# Patient Record
Sex: Male | Born: 1940 | Race: White | Hispanic: No | Marital: Married | Smoking: Former smoker
Health system: Southern US, Community
[De-identification: ages and names within clinical notes are randomized; demographics above are authoritative.]

## PROBLEM LIST (undated history)

## (undated) DIAGNOSIS — Z974 Presence of external hearing-aid: Secondary | ICD-10-CM

## (undated) DIAGNOSIS — E785 Hyperlipidemia, unspecified: Secondary | ICD-10-CM

## (undated) DIAGNOSIS — Z8719 Personal history of other diseases of the digestive system: Secondary | ICD-10-CM

## (undated) DIAGNOSIS — I2510 Atherosclerotic heart disease of native coronary artery without angina pectoris: Secondary | ICD-10-CM

## (undated) DIAGNOSIS — Z972 Presence of dental prosthetic device (complete) (partial): Secondary | ICD-10-CM

## (undated) DIAGNOSIS — K219 Gastro-esophageal reflux disease without esophagitis: Secondary | ICD-10-CM

## (undated) DIAGNOSIS — Z7902 Long term (current) use of antithrombotics/antiplatelets: Secondary | ICD-10-CM

## (undated) DIAGNOSIS — N183 Chronic kidney disease, stage 3 unspecified: Secondary | ICD-10-CM

## (undated) DIAGNOSIS — E119 Type 2 diabetes mellitus without complications: Secondary | ICD-10-CM

## (undated) DIAGNOSIS — C801 Malignant (primary) neoplasm, unspecified: Secondary | ICD-10-CM

## (undated) DIAGNOSIS — I10 Essential (primary) hypertension: Secondary | ICD-10-CM

## (undated) DIAGNOSIS — G43909 Migraine, unspecified, not intractable, without status migrainosus: Secondary | ICD-10-CM

## (undated) DIAGNOSIS — G4733 Obstructive sleep apnea (adult) (pediatric): Secondary | ICD-10-CM

## (undated) DIAGNOSIS — G3184 Mild cognitive impairment, so stated: Secondary | ICD-10-CM

## (undated) DIAGNOSIS — R42 Dizziness and giddiness: Secondary | ICD-10-CM

## (undated) DIAGNOSIS — J189 Pneumonia, unspecified organism: Secondary | ICD-10-CM

## (undated) DIAGNOSIS — C4491 Basal cell carcinoma of skin, unspecified: Secondary | ICD-10-CM

## (undated) DIAGNOSIS — M1990 Unspecified osteoarthritis, unspecified site: Secondary | ICD-10-CM

## (undated) DIAGNOSIS — R519 Headache, unspecified: Secondary | ICD-10-CM

## (undated) DIAGNOSIS — N400 Enlarged prostate without lower urinary tract symptoms: Secondary | ICD-10-CM

## (undated) DIAGNOSIS — I219 Acute myocardial infarction, unspecified: Secondary | ICD-10-CM

## (undated) DIAGNOSIS — G4730 Sleep apnea, unspecified: Secondary | ICD-10-CM

## (undated) HISTORY — PX: CARDIAC CATHETERIZATION: SHX172

## (undated) HISTORY — PX: HERNIA REPAIR: SHX51

## (undated) HISTORY — PX: CATARACT EXTRACTION: SUR2

## (undated) HISTORY — PX: UMBILICAL HERNIA REPAIR: SHX196

## (undated) HISTORY — PX: JOINT REPLACEMENT: SHX530

---

## 2014-09-23 DIAGNOSIS — R040 Epistaxis: Secondary | ICD-10-CM | POA: Insufficient documentation

## 2014-09-23 DIAGNOSIS — K219 Gastro-esophageal reflux disease without esophagitis: Secondary | ICD-10-CM | POA: Insufficient documentation

## 2015-02-04 DIAGNOSIS — H524 Presbyopia: Secondary | ICD-10-CM | POA: Insufficient documentation

## 2015-02-04 DIAGNOSIS — H5200 Hypermetropia, unspecified eye: Secondary | ICD-10-CM | POA: Insufficient documentation

## 2015-02-04 DIAGNOSIS — H2510 Age-related nuclear cataract, unspecified eye: Secondary | ICD-10-CM | POA: Insufficient documentation

## 2015-03-26 DIAGNOSIS — R1310 Dysphagia, unspecified: Secondary | ICD-10-CM | POA: Insufficient documentation

## 2019-03-12 DIAGNOSIS — M47812 Spondylosis without myelopathy or radiculopathy, cervical region: Secondary | ICD-10-CM | POA: Insufficient documentation

## 2019-05-30 DIAGNOSIS — M48061 Spinal stenosis, lumbar region without neurogenic claudication: Secondary | ICD-10-CM | POA: Insufficient documentation

## 2019-07-09 DIAGNOSIS — N281 Cyst of kidney, acquired: Secondary | ICD-10-CM | POA: Insufficient documentation

## 2020-02-11 DIAGNOSIS — Q613 Polycystic kidney, unspecified: Secondary | ICD-10-CM | POA: Insufficient documentation

## 2020-02-11 DIAGNOSIS — I10 Essential (primary) hypertension: Secondary | ICD-10-CM | POA: Insufficient documentation

## 2020-02-11 DIAGNOSIS — N400 Enlarged prostate without lower urinary tract symptoms: Secondary | ICD-10-CM | POA: Insufficient documentation

## 2020-07-21 DIAGNOSIS — I219 Acute myocardial infarction, unspecified: Secondary | ICD-10-CM

## 2020-07-21 HISTORY — DX: Acute myocardial infarction, unspecified: I21.9

## 2020-07-27 DIAGNOSIS — I213 ST elevation (STEMI) myocardial infarction of unspecified site: Secondary | ICD-10-CM

## 2020-07-27 HISTORY — DX: ST elevation (STEMI) myocardial infarction of unspecified site: I21.3

## 2020-09-02 DIAGNOSIS — I2510 Atherosclerotic heart disease of native coronary artery without angina pectoris: Secondary | ICD-10-CM | POA: Insufficient documentation

## 2020-09-02 DIAGNOSIS — E7800 Pure hypercholesterolemia, unspecified: Secondary | ICD-10-CM | POA: Insufficient documentation

## 2020-09-10 ENCOUNTER — Encounter: Payer: Medicare Other | Attending: Cardiology

## 2020-09-10 ENCOUNTER — Other Ambulatory Visit: Payer: Self-pay

## 2020-09-10 DIAGNOSIS — Z955 Presence of coronary angioplasty implant and graft: Secondary | ICD-10-CM

## 2020-09-10 DIAGNOSIS — I213 ST elevation (STEMI) myocardial infarction of unspecified site: Secondary | ICD-10-CM

## 2020-09-10 NOTE — Progress Notes (Signed)
Virtual Visit completed. Patient informed on EP and RD appointment and 6 Minute walk test. Patient also informed of patient health questionnaires on My Chart. Patient Verbalizes understanding. Visit diagnosis can be found in [facility] 09/02/20.

## 2020-09-16 ENCOUNTER — Encounter: Payer: Medicare Other | Admitting: *Deleted

## 2020-09-16 ENCOUNTER — Other Ambulatory Visit: Payer: Self-pay

## 2020-09-16 VITALS — Ht 68.7 in | Wt 192.0 lb

## 2020-09-16 DIAGNOSIS — I213 ST elevation (STEMI) myocardial infarction of unspecified site: Secondary | ICD-10-CM | POA: Diagnosis not present

## 2020-09-16 DIAGNOSIS — Z955 Presence of coronary angioplasty implant and graft: Secondary | ICD-10-CM | POA: Diagnosis present

## 2020-09-16 NOTE — Patient Instructions (Signed)
Patient Instructions  Patient Details  Name: Carlos Mathis MRN: [id number] Date of Birth: [birth year] Referring Provider:  [name], MD  Below are your personal goals for exercise, nutrition, and risk factors. Our goal is to help you stay on track towards obtaining and maintaining these goals. We will be discussing your progress on these goals with you throughout the program.  Initial Exercise Prescription:  Initial Exercise Prescription - 09/16/20 1500      Date of Initial Exercise RX and Referring Provider   Date 09/16/20    Referring Provider Paraschos, Alexander MD      Treadmill   MPH 1.9    Grade 0.5    Minutes 15    METs 2.59      REL-XR   Level 1    Speed 50    Minutes 15    METs 2.5      T5 Nustep   Level 1    SPM 80    Minutes 15    METs 2.5      Prescription Details   Frequency (times per week) 2    Duration Progress to 30 minutes of continuous aerobic without signs/symptoms of physical distress      Intensity   THRR 40-80% of Max Heartrate 104-129    Ratings of Perceived Exertion 11-13    Perceived Dyspnea 0-4      Progression   Progression Continue to progress workloads to maintain intensity without signs/symptoms of physical distress.      Resistance Training   Training Prescription Yes    Weight 3 lb    Reps 10-15           Exercise Goals: Frequency: Be able to perform aerobic exercise two to three times per week in program working toward 2-5 days per week of home exercise.  Intensity: Work with a perceived exertion of 11 (fairly light) - 15 (hard) while following your exercise prescription.  We will make changes to your prescription with you as you progress through the program.   Duration: Be able to do 30 to 45 minutes of continuous aerobic exercise in addition to a 5 minute warm-up and a 5 minute cool-down routine.   Nutrition Goals: Your personal nutrition goals will be established when you do your nutrition analysis with  the dietician.  The following are general nutrition guidelines to follow: Cholesterol < 200mg /day Sodium < 1500mg /day Fiber: Men over 50 yrs - 30 grams per day  Personal Goals:  Personal Goals and Risk Factors at Admission - 09/16/20 1524      Core Components/Risk Factors/Patient Goals on Admission    Weight Management Yes;Weight Loss    Intervention Weight Management: Develop a combined nutrition and exercise program designed to reach desired caloric intake, while maintaining appropriate intake of nutrient and fiber, sodium and fats, and appropriate energy expenditure required for the weight goal.;Weight Management: Provide education and appropriate resources to help participant work on and attain dietary goals.;Weight Management/Obesity: Establish reasonable short term and long term weight goals.    Admit Weight 192 lb (87.1 kg)    Goal Weight: Short Term 185 lb (83.9 kg)    Goal Weight: Long Term 180 lb (81.6 kg)    Expected Outcomes Short Term: Continue to assess and modify interventions until short term weight is achieved;Long Term: Adherence to nutrition and physical activity/exercise program aimed toward attainment of established weight goal;Weight Loss: Understanding of general recommendations for a balanced deficit meal plan, which promotes 1-2 lb weight loss per  week and includes a negative energy balance of [phone] kcal/d;Understanding recommendations for meals to include 15-35% energy as protein, 25-35% energy from fat, 35-60% energy from carbohydrates, less than 200mg  of dietary cholesterol, 20-35 gm of total fiber daily;Understanding of distribution of calorie intake throughout the day with the consumption of 4-5 meals/snacks    Diabetes Yes    Intervention Provide education about signs/symptoms and action to take for hypo/hyperglycemia.;Provide education about proper nutrition, including hydration, and aerobic/resistive exercise prescription along with prescribed medications to  achieve blood glucose in normal ranges: Fasting glucose 65-99 mg/dL    Expected Outcomes Short Term: Participant verbalizes understanding of the signs/symptoms and immediate care of hyper/hypoglycemia, proper foot care and importance of medication, aerobic/resistive exercise and nutrition plan for blood glucose control.;Long Term: Attainment of HbA1C < 7%.    Hypertension Yes    Intervention Provide education on lifestyle modifcations including regular physical activity/exercise, weight management, moderate sodium restriction and increased consumption of fresh fruit, vegetables, and low fat dairy, alcohol moderation, and smoking cessation.;Monitor prescription use compliance.    Expected Outcomes Short Term: Continued assessment and intervention until BP is < 140/43mm HG in hypertensive participants. < 130/54mm HG in hypertensive participants with diabetes, heart failure or chronic kidney disease.;Long Term: Maintenance of blood pressure at goal levels.    Lipids Yes    Intervention Provide education and support for participant on nutrition & aerobic/resistive exercise along with prescribed medications to achieve LDL 70mg , HDL >40mg .    Expected Outcomes Short Term: Participant states understanding of desired cholesterol values and is compliant with medications prescribed. Participant is following exercise prescription and nutrition guidelines.;Long Term: Cholesterol controlled with medications as prescribed, with individualized exercise RX and with personalized nutrition plan. Value goals: LDL < 70mg , HDL > 40 mg.           Tobacco Use Initial Evaluation: Social History   Tobacco Use  Smoking Status Former Smoker  . Packs/day: 1.00  . Years: 20.00  . Pack years: 20.00  . Types: Cigarettes  . Quit date: 09/11/1983  . Years since quitting: 37.0  Smokeless Tobacco Former Systems developer  . Types: Chew  . Quit date: 09/11/1983    Exercise Goals and Review:  Exercise Goals    Row Name 09/16/20 1522              Exercise Goals   Increase Physical Activity Yes       Intervention Provide advice, education, support and counseling about physical activity/exercise needs.;Develop an individualized exercise prescription for aerobic and resistive training based on initial evaluation findings, risk stratification, comorbidities and participant's personal goals.       Expected Outcomes Short Term: Attend rehab on a regular basis to increase amount of physical activity.;Long Term: Add in home exercise to make exercise part of routine and to increase amount of physical activity.;Long Term: Exercising regularly at least 3-5 days a week.       Increase Strength and Stamina Yes       Intervention Provide advice, education, support and counseling about physical activity/exercise needs.;Develop an individualized exercise prescription for aerobic and resistive training based on initial evaluation findings, risk stratification, comorbidities and participant's personal goals.       Expected Outcomes Short Term: Increase workloads from initial exercise prescription for resistance, speed, and METs.;Short Term: Perform resistance training exercises routinely during rehab and add in resistance training at home;Long Term: Improve cardiorespiratory fitness, muscular endurance and strength as measured by increased METs and functional capacity (6MWT)  Able to understand and use rate of perceived exertion (RPE) scale Yes       Intervention Provide education and explanation on how to use RPE scale       Expected Outcomes Short Term: Able to use RPE daily in rehab to express subjective intensity level;Long Term:  Able to use RPE to guide intensity level when exercising independently       Able to understand and use Dyspnea scale Yes       Intervention Provide education and explanation on how to use Dyspnea scale       Expected Outcomes Short Term: Able to use Dyspnea scale daily in rehab to express subjective sense of  shortness of breath during exertion;Long Term: Able to use Dyspnea scale to guide intensity level when exercising independently       Knowledge and understanding of Target Heart Rate Range (THRR) Yes       Intervention Provide education and explanation of THRR including how the numbers were predicted and where they are located for reference       Expected Outcomes Short Term: Able to state/look up THRR;Long Term: Able to use THRR to govern intensity when exercising independently;Short Term: Able to use daily as guideline for intensity in rehab       Able to check pulse independently Yes       Intervention Provide education and demonstration on how to check pulse in carotid and radial arteries.;Review the importance of being able to check your own pulse for safety during independent exercise       Expected Outcomes Short Term: Able to explain why pulse checking is important during independent exercise;Long Term: Able to check pulse independently and accurately       Understanding of Exercise Prescription Yes       Intervention Provide education, explanation, and written materials on patient's individual exercise prescription       Expected Outcomes Short Term: Able to explain program exercise prescription;Long Term: Able to explain home exercise prescription to exercise independently              Copy of goals given to participant.

## 2020-09-16 NOTE — Progress Notes (Signed)
Cardiac Individual Treatment Plan  Patient Details  Name: Carlos Mathis MRN: [id number] Date of Birth: 12-13-1940 Referring Provider:   Flowsheet Row Cardiac Rehab from 09/16/2020 in [facility] Cardiac and Pulmonary Rehab  Referring Provider [name] MD      Initial Encounter Date:  Flowsheet Row Cardiac Rehab from 09/16/2020 in [facility] Cardiac and Pulmonary Rehab  Date 09/16/20      Visit Diagnosis: ST elevation myocardial infarction (STEMI), unspecified artery ([place])  Status post coronary artery stent placement  Patient's Home Medications on Admission:  Current Outpatient Medications:  .  aspirin 81 MG EC tablet, Take by mouth., Disp: , Rfl:  .  atorvastatin (LIPITOR) 40 MG tablet, Take 1 tablet by mouth 2 (two) times daily., Disp: , Rfl:  .  glipiZIDE (GLUCOTROL) 10 MG tablet, Take 1 tablet by mouth 2 (two) times daily., Disp: , Rfl:  .  glucose blood ([employer] BLOOD GLUCOSE TEST) test strip, USE 1 STRIP FOR TESTING AS DIRECTED EVERY DAY TO TEST BLOOD SUGAR, Disp: , Rfl:  .  glucose blood test strip, USE 1 STRIP FOR TESTING AS DIRECTED EVERY DAY TO TEST BLOOD SUGAR, Disp: , Rfl:  .  losartan (COZAAR) 25 MG tablet, Take 1 tablet by mouth daily., Disp: , Rfl:  .  metFORMIN (GLUMETZA) 500 MG (MOD) 24 hr tablet, Take 1 tablet by mouth daily., Disp: , Rfl:  .  metoprolol succinate (TOPROL-XL) 100 MG 24 hr tablet, 25 mg 1 day or 1 dose., Disp: , Rfl:  .  Multiple Vitamin (MULTIVITAMIN) capsule, Take 1 capsule by mouth daily., Disp: , Rfl:  .  nitroGLYCERIN (NITROSTAT) 0.4 MG SL tablet, Place under the tongue., Disp: , Rfl:  .  Omega-3 Fatty Acids (OMEGA-3 FISH OIL) 1200 MG CAPS, Take 1 tablet by mouth daily., Disp: , Rfl:  .  ranolazine (RANEXA) 500 MG 12 hr tablet, Take 1 tablet by mouth 2 (two) times daily., Disp: , Rfl:  .  Red Yeast Rice Extract (RED YEAST RICE PO), Take 1 tablet by mouth 2 (two) times daily., Disp: , Rfl:  .  ticagrelor (BRILINTA) 90 MG TABS tablet, Take 1 tablet  by mouth 2 (two) times daily., Disp: , Rfl:  .  Valerian Root 450 MG CAPS, Take by mouth., Disp: , Rfl:   Past Medical History: No past medical history on file.  Tobacco Use: Social History   Tobacco Use  Smoking Status Former Smoker  . Packs/day: 1.00  . Years: 20.00  . Pack years: 20.00  . Types: Cigarettes  . Quit date: 09/11/1983  . Years since quitting: 37.0  Smokeless Tobacco Former Systems developer  . Types: Chew  . Quit date: 09/11/1983    Labs: Recent Review Flowsheet Data   There is no flowsheet data to display.      Exercise Target Goals: Exercise Program Goal: Individual exercise prescription set using results from initial 6 min walk test and THRR while considering  patient's activity barriers and safety.   Exercise Prescription Goal: Initial exercise prescription builds to 30-45 minutes a day of aerobic activity, 2-3 days per week.  Home exercise guidelines will be given to patient during program as part of exercise prescription that the participant will acknowledge.   Education: Aerobic Exercise: - Group verbal and visual presentation on the components of exercise prescription. Introduces F.I.T.T principle from ACSM for exercise prescriptions.  Reviews F.I.T.T. principles of aerobic exercise including progression. Written material given at graduation. Flowsheet Row Cardiac Rehab from 09/16/2020 in [facility] Cardiac and Pulmonary  Rehab  Education need identified 09/16/20      Education: Resistance Exercise: - Group verbal and visual presentation on the components of exercise prescription. Introduces F.I.T.T principle from ACSM for exercise prescriptions  Reviews F.I.T.T. principles of resistance exercise including progression. Written material given at graduation.    Education: Exercise & Equipment Safety: - Individual verbal instruction and demonstration of equipment use and safety with use of the equipment. Flowsheet Row Cardiac Rehab from 09/16/2020 in [facility] Cardiac and  Pulmonary Rehab  Date 09/10/20  Educator [facility]  Instruction Review Code 1- Verbalizes Understanding      Education: Exercise Physiology & General Exercise Guidelines: - Group verbal and written instruction with models to review the exercise physiology of the cardiovascular system and associated critical values. Provides general exercise guidelines with specific guidelines to those with heart or lung disease.    Education: Flexibility, Balance, Mind/Body Relaxation: - Group verbal and visual presentation with interactive activity on the components of exercise prescription. Introduces F.I.T.T principle from ACSM for exercise prescriptions. Reviews F.I.T.T. principles of flexibility and balance exercise training including progression. Also discusses the mind body connection.  Reviews various relaxation techniques to help reduce and manage stress (i.e. Deep breathing, progressive muscle relaxation, and visualization). Balance handout provided to take home. Written material given at graduation.   Activity Barriers & Risk Stratification:  Activity Barriers & Cardiac Risk Stratification - 09/16/20 1520      Activity Barriers & Cardiac Risk Stratification   Activity Barriers Right Knee Replacement;Arthritis;Joint Problems;Balance Concerns;History of Falls;Muscular Weakness;Shortness of Breath;Deconditioning;Back Problems   arth in back, bilateral knee pain   Cardiac Risk Stratification Moderate           6 Minute Walk:  6 Minute Walk    Row Name 09/16/20 1514         6 Minute Walk   Phase Initial     Distance 1165 feet     Walk Time 6 minutes     # of Rest Breaks 0     MPH 2.21     METS 2.45     RPE 13     VO2 Peak 8.57     Symptoms Yes (comment)     Comments shuffling feet, felt stumbly at end     Resting HR 80 bpm     Resting BP 154/62     Resting Oxygen Saturation  99 %     Exercise Oxygen Saturation  during 6 min walk 99 %     Max Ex. HR 118 bpm     Max Ex. BP 148/74     2  Minute Post BP 112/62            Oxygen Initial Assessment:   Oxygen Re-Evaluation:   Oxygen Discharge (Final Oxygen Re-Evaluation):   Initial Exercise Prescription:  Initial Exercise Prescription - 09/16/20 1500      Date of Initial Exercise RX and Referring Provider   Date 09/16/20    Referring Provider [name] MD      Treadmill   MPH 1.9    Grade 0.5    Minutes 15    METs 2.59      REL-XR   Level 1    Speed 50    Minutes 15    METs 2.5      T5 Nustep   Level 1    SPM 80    Minutes 15    METs 2.5      Prescription Details   Frequency (  times per week) 2    Duration Progress to 30 minutes of continuous aerobic without signs/symptoms of physical distress      Intensity   THRR 40-80% of Max Heartrate 104-129    Ratings of Perceived Exertion 11-13    Perceived Dyspnea 0-4      Progression   Progression Continue to progress workloads to maintain intensity without signs/symptoms of physical distress.      Resistance Training   Training Prescription Yes    Weight 3 lb    Reps 10-15           Perform Capillary Blood Glucose checks as needed.  Exercise Prescription Changes:  Exercise Prescription Changes    Row Name 09/16/20 1500             Response to Exercise   Blood Pressure (Admit) 154/62       Blood Pressure (Exercise) 148/74       Blood Pressure (Exit) 112/62       Heart Rate (Admit) 80 bpm       Heart Rate (Exercise) 118 bpm       Heart Rate (Exit) 90 bpm       Oxygen Saturation (Admit) 99 %       Oxygen Saturation (Exercise) 99 %       Rating of Perceived Exertion (Exercise) 13       Symptoms shuffling feet, felt stumbly at end       Comments walk test results              Exercise Comments:   Exercise Goals and Review:  Exercise Goals    Row Name 09/16/20 1522             Exercise Goals   Increase Physical Activity Yes       Intervention Provide advice, education, support and counseling about physical  activity/exercise needs.;Develop an individualized exercise prescription for aerobic and resistive training based on initial evaluation findings, risk stratification, comorbidities and participant's personal goals.       Expected Outcomes Short Term: Attend rehab on a regular basis to increase amount of physical activity.;Long Term: Add in home exercise to make exercise part of routine and to increase amount of physical activity.;Long Term: Exercising regularly at least 3-5 days a week.       Increase Strength and Stamina Yes       Intervention Provide advice, education, support and counseling about physical activity/exercise needs.;Develop an individualized exercise prescription for aerobic and resistive training based on initial evaluation findings, risk stratification, comorbidities and participant's personal goals.       Expected Outcomes Short Term: Increase workloads from initial exercise prescription for resistance, speed, and METs.;Short Term: Perform resistance training exercises routinely during rehab and add in resistance training at home;Long Term: Improve cardiorespiratory fitness, muscular endurance and strength as measured by increased METs and functional capacity (6MWT)       Able to understand and use rate of perceived exertion (RPE) scale Yes       Intervention Provide education and explanation on how to use RPE scale       Expected Outcomes Short Term: Able to use RPE daily in rehab to express subjective intensity level;Long Term:  Able to use RPE to guide intensity level when exercising independently       Able to understand and use Dyspnea scale Yes       Intervention Provide education and explanation on how to use Dyspnea scale  Expected Outcomes Short Term: Able to use Dyspnea scale daily in rehab to express subjective sense of shortness of breath during exertion;Long Term: Able to use Dyspnea scale to guide intensity level when exercising independently       Knowledge and  understanding of Target Heart Rate Range (THRR) Yes       Intervention Provide education and explanation of THRR including how the numbers were predicted and where they are located for reference       Expected Outcomes Short Term: Able to state/look up THRR;Long Term: Able to use THRR to govern intensity when exercising independently;Short Term: Able to use daily as guideline for intensity in rehab       Able to check pulse independently Yes       Intervention Provide education and demonstration on how to check pulse in carotid and radial arteries.;Review the importance of being able to check your own pulse for safety during independent exercise       Expected Outcomes Short Term: Able to explain why pulse checking is important during independent exercise;Long Term: Able to check pulse independently and accurately       Understanding of Exercise Prescription Yes       Intervention Provide education, explanation, and written materials on patient's individual exercise prescription       Expected Outcomes Short Term: Able to explain program exercise prescription;Long Term: Able to explain home exercise prescription to exercise independently              Exercise Goals Re-Evaluation :   Discharge Exercise Prescription (Final Exercise Prescription Changes):  Exercise Prescription Changes - 09/16/20 1500      Response to Exercise   Blood Pressure (Admit) 154/62    Blood Pressure (Exercise) 148/74    Blood Pressure (Exit) 112/62    Heart Rate (Admit) 80 bpm    Heart Rate (Exercise) 118 bpm    Heart Rate (Exit) 90 bpm    Oxygen Saturation (Admit) 99 %    Oxygen Saturation (Exercise) 99 %    Rating of Perceived Exertion (Exercise) 13    Symptoms shuffling feet, felt stumbly at end    Comments walk test results           Nutrition:  Target Goals: Understanding of nutrition guidelines, daily intake of sodium 1500mg , cholesterol 200mg , calories 30% from fat and 7% or less from saturated  fats, daily to have 5 or more servings of fruits and vegetables.  Education: All About Nutrition: -Group instruction provided by verbal, written material, interactive activities, discussions, models, and posters to present general guidelines for heart healthy nutrition including fat, fiber, MyPlate, the role of sodium in heart healthy nutrition, utilization of the nutrition label, and utilization of this knowledge for meal planning. Follow up email sent as well. Written material given at graduation. Flowsheet Row Cardiac Rehab from 09/16/2020 in [facility] Cardiac and Pulmonary Rehab  Education need identified 09/16/20      Biometrics:  Pre Biometrics - 09/16/20 1523      Pre Biometrics   Height 5' 8.7" (1.745 m)    Weight 192 lb (87.1 kg)    BMI (Calculated) 28.6    Single Leg Stand 6 seconds            Nutrition Therapy Plan and Nutrition Goals:   Nutrition Assessments:  MEDIFICTS Score Key:  ?70 Need to make dietary changes   40-70 Heart Healthy Diet  ? 40 Therapeutic Level Cholesterol Diet  Flowsheet Row Cardiac Rehab from  09/16/2020 in [facility] Cardiac and Pulmonary Rehab  Picture Your Plate Total Score on Admission 80     Picture Your Plate Scores:  <03 Unhealthy dietary pattern with much room for improvement.  41-50 Dietary pattern unlikely to meet recommendations for good health and room for improvement.  51-60 More healthful dietary pattern, with some room for improvement.   >60 Healthy dietary pattern, although there may be some specific behaviors that could be improved.    Nutrition Goals Re-Evaluation:   Nutrition Goals Discharge (Final Nutrition Goals Re-Evaluation):   Psychosocial: Target Goals: Acknowledge presence or absence of significant depression and/or stress, maximize coping skills, provide positive support system. Participant is able to verbalize types and ability to use techniques and skills needed for reducing stress and depression.    Education: Stress, Anxiety, and Depression - Group verbal and visual presentation to define topics covered.  Reviews how body is impacted by stress, anxiety, and depression.  Also discusses healthy ways to reduce stress and to treat/manage anxiety and depression.  Written material given at graduation.   Education: Sleep Hygiene -Provides group verbal and written instruction about how sleep can affect your health.  Define sleep hygiene, discuss sleep cycles and impact of sleep habits. Review good sleep hygiene tips.    Initial Review & Psychosocial Screening:  Initial Psych Review & Screening - 09/10/20 1011      Initial Review   Current issues with Current Stress Concerns    Comments The just moved from [place] and are in a house that needs a bit of work and is stressing him out.      Family Dynamics   Good Support System? Yes    Comments He has his wfe and God for support. They wanted to move back to his home state and his wife enjoyed it here .He has a positive outlook on his health.      Barriers   Psychosocial barriers to participate in program The patient should benefit from training in stress management and relaxation.      Screening Interventions   Interventions To provide support and resources with identified psychosocial needs;Provide feedback about the scores to participant;Encouraged to exercise    Expected Outcomes Short Term goal: Utilizing psychosocial counselor, staff and physician to assist with identification of specific Stressors or current issues interfering with healing process. Setting desired goal for each stressor or current issue identified.;Long Term Goal: Stressors or current issues are controlled or eliminated.;Short Term goal: Identification and review with participant of any Quality of Life or Depression concerns found by scoring the questionnaire.;Long Term goal: The participant improves quality of Life and PHQ9 Scores as seen by post scores and/or  verbalization of changes           Quality of Life Scores:   Quality of Life - 09/16/20 1523      Quality of Life   Select Quality of Life      Quality of Life Scores   Health/Function Pre 24.33 %    Socioeconomic Pre 23.75 %    Psych/Spiritual Pre 29.64 %    Family Pre 26.4 %    GLOBAL Pre 25.56 %          Scores of 19 and below usually indicate a poorer quality of life in these areas.  A difference of  2-3 points is a clinically meaningful difference.  A difference of 2-3 points in the total score of the Quality of Life Index has been associated with significant improvement in  overall quality of life, self-image, physical symptoms, and general health in studies assessing change in quality of life.  PHQ-9: Recent Review Flowsheet Data    Depression screen [facility] 2/9 09/16/2020   Decreased Interest 1   Down, Depressed, Hopeless 1   PHQ - 2 Score 2   Altered sleeping 0   Tired, decreased energy 1   Change in appetite 1   Feeling bad or failure about yourself  0   Trouble concentrating 1   Moving slowly or fidgety/restless 1   Suicidal thoughts 0   PHQ-9 Score 6   Difficult doing work/chores Not difficult at all     Interpretation of Total Score  Total Score Depression Severity:  1-4 = Minimal depression, 5-9 = Mild depression, 10-14 = Moderate depression, 15-19 = Moderately severe depression, 20-27 = Severe depression   Psychosocial Evaluation and Intervention:  Psychosocial Evaluation - 09/10/20 1016      Psychosocial Evaluation & Interventions   Interventions Encouraged to exercise with the program and follow exercise prescription;Relaxation education;Stress management education    Comments The just moved from [place] and are in a house that needs a bit of work and is stressing him out. He has his wfe and God for support. They wanted to move back to his home state and his wife enjoyed it here .He has a positive outlook on his health.    Expected Outcomes Short:  Exercise regularly to support mental health and notify staff of any changes. Long: maintain mental health and well being through teaching of rehab or prescribed medications independently.    Continue Psychosocial Services  Follow up required by staff           Psychosocial Re-Evaluation:   Psychosocial Discharge (Final Psychosocial Re-Evaluation):   Vocational Rehabilitation: Provide vocational rehab assistance to qualifying candidates.   Vocational Rehab Evaluation & Intervention:   Education: Education Goals: Education classes will be provided on a variety of topics geared toward better understanding of heart health and risk factor modification. Participant will state understanding/return demonstration of topics presented as noted by education test scores.  Learning Barriers/Preferences:  Learning Barriers/Preferences - 09/10/20 1009      Learning Barriers/Preferences   Learning Barriers None    Learning Preferences None           General Cardiac Education Topics:  AED/CPR: - Group verbal and written instruction with the use of models to demonstrate the basic use of the AED with the basic ABC's of resuscitation.   Anatomy and Cardiac Procedures: - Group verbal and visual presentation and models provide information about basic cardiac anatomy and function. Reviews the testing methods done to diagnose heart disease and the outcomes of the test results. Describes the treatment choices: Medical Management, Angioplasty, or Coronary Bypass Surgery for treating various heart conditions including Myocardial Infarction, Angina, Valve Disease, and Cardiac Arrhythmias.  Written material given at graduation. Flowsheet Row Cardiac Rehab from 09/16/2020 in [facility] Cardiac and Pulmonary Rehab  Education need identified 09/16/20      Medication Safety: - Group verbal and visual instruction to review commonly prescribed medications for heart and lung disease. Reviews the medication, class  of the drug, and side effects. Includes the steps to properly store meds and maintain the prescription regimen.  Written material given at graduation.   Intimacy: - Group verbal instruction through game format to discuss how heart and lung disease can affect sexual intimacy. Written material given at graduation..   Know Your Numbers and Heart Failure: -  Group verbal and visual instruction to discuss disease risk factors for cardiac and pulmonary disease and treatment options.  Reviews associated critical values for Overweight/Obesity, Hypertension, Cholesterol, and Diabetes.  Discusses basics of heart failure: signs/symptoms and treatments.  Introduces Heart Failure Zone chart for action plan for heart failure.  Written material given at graduation.   Infection Prevention: - Provides verbal and written material to individual with discussion of infection control including proper hand washing and proper equipment cleaning during exercise session. Flowsheet Row Cardiac Rehab from 09/16/2020 in [facility] Cardiac and Pulmonary Rehab  Date 09/10/20  Educator [facility]  Instruction Review Code 1- Verbalizes Understanding      Falls Prevention: - Provides verbal and written material to individual with discussion of falls prevention and safety. Flowsheet Row Cardiac Rehab from 09/16/2020 in [facility] Cardiac and Pulmonary Rehab  Date 09/10/20  Educator [facility]  Instruction Review Code 1- Verbalizes Understanding      Other: -Provides group and verbal instruction on various topics (see comments)   Knowledge Questionnaire Score:  Knowledge Questionnaire Score - 09/16/20 1524      Knowledge Questionnaire Score   Pre Score 19/26 Education Focus: Angina, nurtrition, exercise           Core Components/Risk Factors/Patient Goals at Admission:  Personal Goals and Risk Factors at Admission - 09/16/20 1524      Core Components/Risk Factors/Patient Goals on Admission    Weight Management Yes;Weight Loss     Intervention Weight Management: Develop a combined nutrition and exercise program designed to reach desired caloric intake, while maintaining appropriate intake of nutrient and fiber, sodium and fats, and appropriate energy expenditure required for the weight goal.;Weight Management: Provide education and appropriate resources to help participant work on and attain dietary goals.;Weight Management/Obesity: Establish reasonable short term and long term weight goals.    Admit Weight 192 lb (87.1 kg)    Goal Weight: Short Term 185 lb (83.9 kg)    Goal Weight: Long Term 180 lb (81.6 kg)    Expected Outcomes Short Term: Continue to assess and modify interventions until short term weight is achieved;Long Term: Adherence to nutrition and physical activity/exercise program aimed toward attainment of established weight goal;Weight Loss: Understanding of general recommendations for a balanced deficit meal plan, which promotes 1-2 lb weight loss per week and includes a negative energy balance of [phone] kcal/d;Understanding recommendations for meals to include 15-35% energy as protein, 25-35% energy from fat, 35-60% energy from carbohydrates, less than 200mg  of dietary cholesterol, 20-35 gm of total fiber daily;Understanding of distribution of calorie intake throughout the day with the consumption of 4-5 meals/snacks    Diabetes Yes    Intervention Provide education about signs/symptoms and action to take for hypo/hyperglycemia.;Provide education about proper nutrition, including hydration, and aerobic/resistive exercise prescription along with prescribed medications to achieve blood glucose in normal ranges: Fasting glucose 65-99 mg/dL    Expected Outcomes Short Term: Participant verbalizes understanding of the signs/symptoms and immediate care of hyper/hypoglycemia, proper foot care and importance of medication, aerobic/resistive exercise and nutrition plan for blood glucose control.;Long Term: Attainment of HbA1C <  7%.    Hypertension Yes    Intervention Provide education on lifestyle modifcations including regular physical activity/exercise, weight management, moderate sodium restriction and increased consumption of fresh fruit, vegetables, and low fat dairy, alcohol moderation, and smoking cessation.;Monitor prescription use compliance.    Expected Outcomes Short Term: Continued assessment and intervention until BP is < 140/38mm HG in hypertensive participants. < 130/65mm HG in hypertensive  participants with diabetes, heart failure or chronic kidney disease.;Long Term: Maintenance of blood pressure at goal levels.    Lipids Yes    Intervention Provide education and support for participant on nutrition & aerobic/resistive exercise along with prescribed medications to achieve LDL 70mg , HDL >40mg .    Expected Outcomes Short Term: Participant states understanding of desired cholesterol values and is compliant with medications prescribed. Participant is following exercise prescription and nutrition guidelines.;Long Term: Cholesterol controlled with medications as prescribed, with individualized exercise RX and with personalized nutrition plan. Value goals: LDL < 70mg , HDL > 40 mg.           Education:Diabetes - Individual verbal and written instruction to review signs/symptoms of diabetes, desired ranges of glucose level fasting, after meals and with exercise. Acknowledge that pre and post exercise glucose checks will be done for 3 sessions at entry of program. [place] from 09/16/2020 in [facility] Cardiac and Pulmonary Rehab  Date 09/10/20  Educator [facility]  Instruction Review Code 1- Verbalizes Understanding      Core Components/Risk Factors/Patient Goals Review:    Core Components/Risk Factors/Patient Goals at Discharge (Final Review):    ITP Comments:  ITP Comments    Row Name 09/10/20 1008 09/16/20 1514         ITP Comments Virtual Visit completed. Patient informed on EP and RD  appointment and 6 Minute walk test. Patient also informed of patient health questionnaires on My Chart. Patient Verbalizes understanding. Visit diagnosis can be found in [facility] 09/02/20. Completed 6MWT and gym orientation. Initial ITP created and sent for review to Dr. Emily Filbert, Medical Director.             Comments: Initial ITP

## 2020-09-17 ENCOUNTER — Other Ambulatory Visit: Payer: Self-pay

## 2020-09-17 DIAGNOSIS — I213 ST elevation (STEMI) myocardial infarction of unspecified site: Secondary | ICD-10-CM

## 2020-09-17 DIAGNOSIS — Z955 Presence of coronary angioplasty implant and graft: Secondary | ICD-10-CM

## 2020-09-17 LAB — GLUCOSE, CAPILLARY
Glucose-Capillary: 208 mg/dL — ABNORMAL HIGH (ref 70–99)
Glucose-Capillary: 62 mg/dL — ABNORMAL LOW (ref 70–99)
Glucose-Capillary: 87 mg/dL (ref 70–99)

## 2020-09-17 NOTE — Progress Notes (Signed)
Daily Session Note  Patient Details  Name: Carlos Mathis MRN: [id number] Date of Birth: 04/22/1941 Referring Provider:   Flowsheet Row Cardiac Rehab from 09/16/2020 in [facility] Cardiac and Pulmonary Rehab  Referring Provider Paraschos, Alexander MD      Encounter Date: 09/17/2020  Check In:  Session Check In - 09/17/20 1038      Check-In   Supervising physician immediately available to respond to emergencies See telemetry face sheet for immediately available ER MD    Location [facility]-Cardiac & Pulmonary Rehab    Staff Present Meredith Craven, RN BSN;Colleen Bailey, RN, BSN; , MPA, RN;Amanda Sommer, BA, ACSM CEP, Exercise Physiologist    Virtual Visit No    Medication changes reported     No    Fall or balance concerns reported    No    Warm-up and Cool-down Performed on first and last piece of equipment    Resistance Training Performed Yes    VAD Patient? No    PAD/SET Patient? No      Pain Assessment   Currently in Pain? No/denies              Social History   Tobacco Use  Smoking Status Former Smoker  . Packs/day: 1.00  . Years: 20.00  . Pack years: 20.00  . Types: Cigarettes  . Quit date: 09/11/1983  . Years since quitting: 37.0  Smokeless Tobacco Former User  . Types: Chew  . Quit date: 09/11/1983    Goals Met:  Independence with exercise equipment Exercise tolerated well No report of cardiac concerns or symptoms Strength training completed today  Goals Unmet:  Not Applicable  Comments: First full day of exercise!  Patient was oriented to gym and equipment including functions, settings, policies, and procedures.  Patient's individual exercise prescription and treatment plan were reviewed.  All starting workloads were established based on the results of the 6 minute walk test done at initial orientation visit.  The plan for exercise progression was also introduced and progression will be customized based on patient's performance and  goals.    Dr. Mark Miller is Medical Director for [facility] and [facility]. 

## 2020-09-22 ENCOUNTER — Other Ambulatory Visit: Payer: Self-pay

## 2020-09-22 ENCOUNTER — Encounter: Payer: Medicare Other | Attending: Cardiology

## 2020-09-22 DIAGNOSIS — Z955 Presence of coronary angioplasty implant and graft: Secondary | ICD-10-CM | POA: Diagnosis present

## 2020-09-22 DIAGNOSIS — I213 ST elevation (STEMI) myocardial infarction of unspecified site: Secondary | ICD-10-CM

## 2020-09-22 LAB — GLUCOSE, CAPILLARY
Glucose-Capillary: 113 mg/dL — ABNORMAL HIGH (ref 70–99)
Glucose-Capillary: 139 mg/dL — ABNORMAL HIGH (ref 70–99)

## 2020-09-22 NOTE — Progress Notes (Signed)
Daily Session Note  Patient Details  Name: Carlos Mathis MRN: [id number] Date of Birth: 07/14/1940 Referring Provider:   Flowsheet Row Cardiac Rehab from 09/16/2020 in [facility] Cardiac and Pulmonary Rehab  Referring Provider [name] MD      Encounter Date: 09/22/2020  Check In:  Session Check In - 09/22/20 1102      Check-In   Supervising physician immediately available to respond to emergencies See telemetry face sheet for immediately available ER MD    Location [facility]-Cardiac & Pulmonary Rehab    Staff Present Birdie Sons, MPA, Elveria Rising, BA, ACSM CEP, Exercise Physiologist;Melissa Caiola RDN, Tawanna Solo, MS Exercise Physiologist    Virtual Visit No    Medication changes reported     No    Fall or balance concerns reported    No    Warm-up and Cool-down Performed on first and last piece of equipment    Resistance Training Performed Yes    VAD Patient? No    PAD/SET Patient? No      Pain Assessment   Currently in Pain? No/denies              Social History   Tobacco Use  Smoking Status Former Smoker  . Packs/day: 1.00  . Years: 20.00  . Pack years: 20.00  . Types: Cigarettes  . Quit date: 09/11/1983  . Years since quitting: 37.0  Smokeless Tobacco Former Systems developer  . Types: Chew  . Quit date: 09/11/1983    Goals Met:  Independence with exercise equipment Exercise tolerated well No report of cardiac concerns or symptoms Strength training completed today  Goals Unmet:  Not Applicable  Comments: Pt able to follow exercise prescription today without complaint.  Will continue to monitor for progression.    Dr. Emily Filbert is Medical Director for [place] and [facility].

## 2020-09-24 ENCOUNTER — Other Ambulatory Visit: Payer: Self-pay

## 2020-09-24 ENCOUNTER — Ambulatory Visit: Payer: Medicare Other | Attending: Cardiology

## 2020-09-24 DIAGNOSIS — Z955 Presence of coronary angioplasty implant and graft: Secondary | ICD-10-CM

## 2020-09-24 DIAGNOSIS — M6281 Muscle weakness (generalized): Secondary | ICD-10-CM | POA: Insufficient documentation

## 2020-09-24 DIAGNOSIS — R2681 Unsteadiness on feet: Secondary | ICD-10-CM | POA: Insufficient documentation

## 2020-09-24 DIAGNOSIS — I213 ST elevation (STEMI) myocardial infarction of unspecified site: Secondary | ICD-10-CM

## 2020-09-24 LAB — GLUCOSE, CAPILLARY
Glucose-Capillary: 163 mg/dL — ABNORMAL HIGH (ref 70–99)
Glucose-Capillary: 170 mg/dL — ABNORMAL HIGH (ref 70–99)

## 2020-09-24 NOTE — Progress Notes (Signed)
Daily Session Note  Patient Details  Name: Carlos Mathis MRN: [id number] Date of Birth: 1940/09/06 Referring Provider:   Flowsheet Row Cardiac Rehab from 09/16/2020 in [facility] Cardiac and Pulmonary Rehab  Referring Provider [name] MD      Encounter Date: 09/24/2020  Check In:  Session Check In - 09/24/20 1110      Check-In   Supervising physician immediately available to respond to emergencies See telemetry face sheet for immediately available ER MD    Location [facility]-Cardiac & Pulmonary Rehab    Staff Present Birdie Sons, MPA, RN;Melissa [place] RDN, LDN;Joseph Yates [place], [place], RCEP, CCRP, CCET    Virtual Visit No    Medication changes reported     No    Fall or balance concerns reported    No    Warm-up and Cool-down Performed on first and last piece of equipment    Resistance Training Performed Yes    VAD Patient? No    PAD/SET Patient? No      Pain Assessment   Currently in Pain? No/denies              Social History   Tobacco Use  Smoking Status Former Smoker  . Packs/day: 1.00  . Years: 20.00  . Pack years: 20.00  . Types: Cigarettes  . Quit date: 09/11/1983  . Years since quitting: 37.0  Smokeless Tobacco Former Systems developer  . Types: Chew  . Quit date: 09/11/1983    Goals Met:  Independence with exercise equipment Exercise tolerated well No report of cardiac concerns or symptoms Strength training completed today  Goals Unmet:  Not Applicable  Comments: Pt able to follow exercise prescription today without complaint.  Will continue to monitor for progression.    Dr. Emily Filbert is Medical Director for [place] and [facility].

## 2020-09-24 NOTE — Therapy (Signed)
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 Pt reports problem worsened six weeks ago around the time of his heart attack, but reports he has had gait problems for several years. Pt states he feels his balance/gait has improved since stent placement. Pt reports he has hx of exposure to agent orange from his time serving in the [place] war. Pt reports recent near falls and one fall within the last six months. Pt says near-fall occurred this morning when pt almost fell off his porch. Pt says this is due to difficulty stopping once he starts walking.  Pt says fall the other month also occurred because he "couldn't stop" his feet. Pt reports he also shuffles when walking. Pt states that he has not discussed these symptoms with his PCP, since he recently moved to area and will be seeing new doctor for first time at the [place] next week.  Regarding other symptoms, the pt reports he has B foot numbness and is "losing hair" on both his lower legs. Pt states he  broke R foot years ago but never had treatment for it or PT.  Pt states his L ear "feels full" and that he has hx of vertigo. Pt also states he feels light-headed sometimes when he sits up. Pt reports weight loss of approx. 18 pounds since hospital stay due to heart attack. Pt has 4/10 back pain today.   Assessment:   M-CTSIB: Pt able to perform EO on firm surface. Pt with decreased postural stability and close CGA with EC on firm surface.  Pt with decreased postural stability but no falls with EO on compliant surface. Pt unable to maintain EC on compliant surface without LOB, requiring UE support and CGA to regain balance.  These findings are suggestive of possible inner-ear/vestibular involvement and that pt is relying predominantly on visual cues for balance.  SLB: Pt can maintain approx 5-10 seconds of SLB on BLEs.  MMT: LEs strength grossly 4/5.  Smooth pursuit testing: saccadic/impaired.  Saccade testing: Pt exhibits slow horizontal saccades B.   Recommendations: PT has instructed pt to follow-up with his physician regarding light-headedness with position change, history of vertigo with current sensation of fullness in his ear, difficulty slowling down/stopping with gait, shuffling gait and frequent near falls and recent history of fall. PT also instructed pt to inform his physician of 18 lb weight loss. Pt verbalized understanding. Due to pt impaired strength and balance, PT has also suggested pt follow-up regarding referral for PT evaluation and treatment. In summary, the pt would benefit from following up with MD and PT.   Comments: Pt shuffling gait and difficulty stopping  with LOB is suggestive of possible central involvement. Pt difficulty with EC on compliant and firm surface, hx of vertigo, inner ear "fullness" and impaired smooth pursuits and saccade testing is suggestive of possible vestibular involvement, which is why PT recommends pt follow-up with MD. Pt would also benefit form  PT referall to address general decrease in BLE strength in addition to balance and to further investigate possible vestibular symptoms.     [x]  Patient would benefit from an MD referral [x]  Patient would benefit from a full PT/OT/ SLP evaluation and treatment. []  No intervention recommended at this time.    Ricard Dillon PT, DPT 09/24/2020, 4:11 PM  [place] MAIN [facility] SERVICES [address] [place], [place], [address] Phone: [phone]   Fax:  [phone]

## 2020-09-29 ENCOUNTER — Other Ambulatory Visit: Payer: Self-pay

## 2020-09-29 DIAGNOSIS — I213 ST elevation (STEMI) myocardial infarction of unspecified site: Secondary | ICD-10-CM

## 2020-09-29 NOTE — Progress Notes (Signed)
Daily Session Note  Patient Details  Name: Carlos Mathis MRN: [id number] Date of Birth: 10-31-1940 Referring Provider:   Flowsheet Row Cardiac Rehab from 09/16/2020 in [facility] Cardiac and Pulmonary Rehab  Referring Provider [name] MD      Encounter Date: 09/29/2020  Check In:  Session Check In - 09/29/20 1059      Check-In   Supervising physician immediately available to respond to emergencies See telemetry face sheet for immediately available ER MD    Location [facility]-Cardiac & Pulmonary Rehab    Staff Present Birdie Sons, MPA, RN;Melissa Caiola RDN, Rowe Pavy, BA, ACSM CEP, Exercise Physiologist;Joseph Tessie Fass RCP,RRT,BSRT    Virtual Visit No    Medication changes reported     No    Fall or balance concerns reported    No    Warm-up and Cool-down Performed on first and last piece of equipment    Resistance Training Performed Yes    VAD Patient? No    PAD/SET Patient? No      Pain Assessment   Currently in Pain? No/denies              Social History   Tobacco Use  Smoking Status Former Smoker  . Packs/day: 1.00  . Years: 20.00  . Pack years: 20.00  . Types: Cigarettes  . Quit date: 09/11/1983  . Years since quitting: 37.0  Smokeless Tobacco Former Systems developer  . Types: Chew  . Quit date: 09/11/1983    Goals Met:  Independence with exercise equipment Exercise tolerated well No report of cardiac concerns or symptoms Strength training completed today  Goals Unmet:  Not Applicable  Comments: Pt able to follow exercise prescription today without complaint.  Will continue to monitor for progression.    Dr. Emily Filbert is Medical Director for [place] and [facility].

## 2020-10-01 ENCOUNTER — Other Ambulatory Visit: Payer: Self-pay

## 2020-10-01 DIAGNOSIS — I213 ST elevation (STEMI) myocardial infarction of unspecified site: Secondary | ICD-10-CM

## 2020-10-01 DIAGNOSIS — Z955 Presence of coronary angioplasty implant and graft: Secondary | ICD-10-CM

## 2020-10-01 NOTE — Progress Notes (Signed)
Daily Session Note  Patient Details  Name: Carlos Mathis MRN: [id number] Date of Birth: [birth year] Referring Provider:   Flowsheet Row Cardiac Rehab from 09/16/2020 in [facility] Cardiac and Pulmonary Rehab  Referring Provider [name] MD      Encounter Date: 10/01/2020  Check In:  Session Check In - 10/01/20 1054      Check-In   Supervising physician immediately available to respond to emergencies See telemetry face sheet for immediately available ER MD    Location [facility]-Cardiac & Pulmonary Rehab    Staff Present Birdie Sons, MPA, RN;Melissa Caiola RDN, LDN;Meredith Sherryll Burger, RN BSN;Joseph Hood RCP,RRT,BSRT    Virtual Visit No    Medication changes reported     No    Fall or balance concerns reported    No    Warm-up and Cool-down Performed on first and last piece of equipment    Resistance Training Performed Yes    VAD Patient? No    PAD/SET Patient? No      Pain Assessment   Currently in Pain? No/denies              Social History   Tobacco Use  Smoking Status Former Smoker  . Packs/day: 1.00  . Years: 20.00  . Pack years: 20.00  . Types: Cigarettes  . Quit date: 09/11/1983  . Years since quitting: 37.0  Smokeless Tobacco Former Systems developer  . Types: Chew  . Quit date: 09/11/1983    Goals Met:  Independence with exercise equipment Exercise tolerated well No report of cardiac concerns or symptoms Strength training completed today  Goals Unmet:  Not Applicable  Comments: Pt able to follow exercise prescription today without complaint.  Will continue to monitor for progression.    Dr. Emily Filbert is Medical Director for [place] and [facility].

## 2020-10-06 ENCOUNTER — Encounter: Payer: Medicare Other | Admitting: *Deleted

## 2020-10-06 ENCOUNTER — Other Ambulatory Visit: Payer: Self-pay

## 2020-10-06 DIAGNOSIS — Z955 Presence of coronary angioplasty implant and graft: Secondary | ICD-10-CM

## 2020-10-06 DIAGNOSIS — I213 ST elevation (STEMI) myocardial infarction of unspecified site: Secondary | ICD-10-CM

## 2020-10-06 NOTE — Progress Notes (Signed)
Daily Session Note  Patient Details  Name: Jaceyon Slatten MRN: [id number] Date of Birth: 05/31/1941 Referring Provider:   Flowsheet Row Cardiac Rehab from 09/16/2020 in [facility] Cardiac and Pulmonary Rehab  Referring Provider Paraschos, Alexander MD      Encounter Date: 10/06/2020  Check In:  Session Check In - 10/06/20 1144      Check-In   Supervising physician immediately available to respond to emergencies See telemetry face sheet for immediately available ER MD    Location [facility]-Cardiac & Pulmonary Rehab    Staff Present  , RN, BSN, CCRP;Laureen Brown, BS, RRT, CPFT;Melissa Caiola RDN, LDN;Joseph Hood RCP,RRT,BSRT    Virtual Visit No    Medication changes reported     No    Fall or balance concerns reported    No    Warm-up and Cool-down Performed on first and last piece of equipment    Resistance Training Performed Yes    VAD Patient? No    PAD/SET Patient? No      Pain Assessment   Currently in Pain? No/denies              Social History   Tobacco Use  Smoking Status Former Smoker  . Packs/day: 1.00  . Years: 20.00  . Pack years: 20.00  . Types: Cigarettes  . Quit date: 09/11/1983  . Years since quitting: 37.0  Smokeless Tobacco Former User  . Types: Chew  . Quit date: 09/11/1983    Goals Met:  Independence with exercise equipment Exercise tolerated well No report of cardiac concerns or symptoms  Goals Unmet:  Not Applicable  Comments: Pt able to follow exercise prescription today without complaint.  Will continue to monitor for progression.    Dr. Mark Miller is Medical Director for [facility] and [facility]. 

## 2020-10-07 ENCOUNTER — Encounter: Payer: Self-pay | Admitting: *Deleted

## 2020-10-07 DIAGNOSIS — I213 ST elevation (STEMI) myocardial infarction of unspecified site: Secondary | ICD-10-CM

## 2020-10-07 DIAGNOSIS — Z955 Presence of coronary angioplasty implant and graft: Secondary | ICD-10-CM

## 2020-10-07 NOTE — Progress Notes (Signed)
Cardiac Individual Treatment Plan  Patient Details  Name: Carlos Mathis MRN: [id number] Date of Birth: October 10, 1940 Referring Provider:   Flowsheet Row Cardiac Rehab from 09/16/2020 in [facility] Cardiac and Pulmonary Rehab  Referring Provider [name] MD      Initial Encounter Date:  Flowsheet Row Cardiac Rehab from 09/16/2020 in [facility] Cardiac and Pulmonary Rehab  Date 09/16/20      Visit Diagnosis: ST elevation myocardial infarction (STEMI), unspecified artery ([place])  Status post coronary artery stent placement  Patient's Home Medications on Admission:  Current Outpatient Medications:  .  aspirin 81 MG EC tablet, Take by mouth., Disp: , Rfl:  .  atorvastatin (LIPITOR) 40 MG tablet, Take 1 tablet by mouth 2 (two) times daily., Disp: , Rfl:  .  glipiZIDE (GLUCOTROL) 10 MG tablet, Take 1 tablet by mouth 2 (two) times daily., Disp: , Rfl:  .  glucose blood ([employer] BLOOD GLUCOSE TEST) test strip, USE 1 STRIP FOR TESTING AS DIRECTED EVERY DAY TO TEST BLOOD SUGAR, Disp: , Rfl:  .  glucose blood test strip, USE 1 STRIP FOR TESTING AS DIRECTED EVERY DAY TO TEST BLOOD SUGAR, Disp: , Rfl:  .  losartan (COZAAR) 25 MG tablet, Take 1 tablet by mouth daily., Disp: , Rfl:  .  metFORMIN (GLUMETZA) 500 MG (MOD) 24 hr tablet, Take 1 tablet by mouth daily., Disp: , Rfl:  .  metoprolol succinate (TOPROL-XL) 100 MG 24 hr tablet, 25 mg 1 day or 1 dose., Disp: , Rfl:  .  Multiple Vitamin (MULTIVITAMIN) capsule, Take 1 capsule by mouth daily., Disp: , Rfl:  .  nitroGLYCERIN (NITROSTAT) 0.4 MG SL tablet, Place under the tongue., Disp: , Rfl:  .  Omega-3 Fatty Acids (OMEGA-3 FISH OIL) 1200 MG CAPS, Take 1 tablet by mouth daily., Disp: , Rfl:  .  ranolazine (RANEXA) 500 MG 12 hr tablet, Take 1 tablet by mouth 2 (two) times daily., Disp: , Rfl:  .  Red Yeast Rice Extract (RED YEAST RICE PO), Take 1 tablet by mouth 2 (two) times daily., Disp: , Rfl:  .  ticagrelor (BRILINTA) 90 MG TABS tablet, Take 1 tablet  by mouth 2 (two) times daily., Disp: , Rfl:  .  Valerian Root 450 MG CAPS, Take by mouth., Disp: , Rfl:   Past Medical History: No past medical history on file.  Tobacco Use: Social History   Tobacco Use  Smoking Status Former Smoker  . Packs/day: 1.00  . Years: 20.00  . Pack years: 20.00  . Types: Cigarettes  . Quit date: 09/11/1983  . Years since quitting: 37.0  Smokeless Tobacco Former Systems developer  . Types: Chew  . Quit date: 09/11/1983    Labs: Recent Review Flowsheet Data   There is no flowsheet data to display.      Exercise Target Goals: Exercise Program Goal: Individual exercise prescription set using results from initial 6 min walk test and THRR while considering  patient's activity barriers and safety.   Exercise Prescription Goal: Initial exercise prescription builds to 30-45 minutes a day of aerobic activity, 2-3 days per week.  Home exercise guidelines will be given to patient during program as part of exercise prescription that the participant will acknowledge.   Education: Aerobic Exercise: - Group verbal and visual presentation on the components of exercise prescription. Introduces F.I.T.T principle from ACSM for exercise prescriptions.  Reviews F.I.T.T. principles of aerobic exercise including progression. Written material given at graduation. Flowsheet Row Cardiac Rehab from 09/24/2020 in [facility] Cardiac and Pulmonary  Rehab  Education need identified 09/16/20      Education: Resistance Exercise: - Group verbal and visual presentation on the components of exercise prescription. Introduces F.I.T.T principle from ACSM for exercise prescriptions  Reviews F.I.T.T. principles of resistance exercise including progression. Written material given at graduation.    Education: Exercise & Equipment Safety: - Individual verbal instruction and demonstration of equipment use and safety with use of the equipment. Flowsheet Row Cardiac Rehab from 09/24/2020 in [facility] Cardiac and  Pulmonary Rehab  Date 09/10/20  Educator [facility]  Instruction Review Code 1- Verbalizes Understanding      Education: Exercise Physiology & General Exercise Guidelines: - Group verbal and written instruction with models to review the exercise physiology of the cardiovascular system and associated critical values. Provides general exercise guidelines with specific guidelines to those with heart or lung disease.    Education: Flexibility, Balance, Mind/Body Relaxation: - Group verbal and visual presentation with interactive activity on the components of exercise prescription. Introduces F.I.T.T principle from ACSM for exercise prescriptions. Reviews F.I.T.T. principles of flexibility and balance exercise training including progression. Also discusses the mind body connection.  Reviews various relaxation techniques to help reduce and manage stress (i.e. Deep breathing, progressive muscle relaxation, and visualization). Balance handout provided to take home. Written material given at graduation.   Activity Barriers & Risk Stratification:  Activity Barriers & Cardiac Risk Stratification - 09/16/20 1520      Activity Barriers & Cardiac Risk Stratification   Activity Barriers Right Knee Replacement;Arthritis;Joint Problems;Balance Concerns;History of Falls;Muscular Weakness;Shortness of Breath;Deconditioning;Back Problems   arth in back, bilateral knee pain   Cardiac Risk Stratification Moderate           6 Minute Walk:  6 Minute Walk    Row Name 09/16/20 1514         6 Minute Walk   Phase Initial     Distance 1165 feet     Walk Time 6 minutes     # of Rest Breaks 0     MPH 2.21     METS 2.45     RPE 13     VO2 Peak 8.57     Symptoms Yes (comment)     Comments shuffling feet, felt stumbly at end     Resting HR 80 bpm     Resting BP 154/62     Resting Oxygen Saturation  99 %     Exercise Oxygen Saturation  during 6 min walk 99 %     Max Ex. HR 118 bpm     Max Ex. BP 148/74     2  Minute Post BP 112/62            Oxygen Initial Assessment:   Oxygen Re-Evaluation:   Oxygen Discharge (Final Oxygen Re-Evaluation):   Initial Exercise Prescription:  Initial Exercise Prescription - 09/16/20 1500      Date of Initial Exercise RX and Referring Provider   Date 09/16/20    Referring Provider [name] MD      Treadmill   MPH 1.9    Grade 0.5    Minutes 15    METs 2.59      REL-XR   Level 1    Speed 50    Minutes 15    METs 2.5      T5 Nustep   Level 1    SPM 80    Minutes 15    METs 2.5      Prescription Details   Frequency (  times per week) 2    Duration Progress to 30 minutes of continuous aerobic without signs/symptoms of physical distress      Intensity   THRR 40-80% of Max Heartrate 104-129    Ratings of Perceived Exertion 11-13    Perceived Dyspnea 0-4      Progression   Progression Continue to progress workloads to maintain intensity without signs/symptoms of physical distress.      Resistance Training   Training Prescription Yes    Weight 3 lb    Reps 10-15           Perform Capillary Blood Glucose checks as needed.  Exercise Prescription Changes:  Exercise Prescription Changes    Row Name 09/16/20 1500 09/21/20 0900           Response to Exercise   Blood Pressure (Admit) 154/62 124/74      Blood Pressure (Exercise) 148/74 116/64      Blood Pressure (Exit) 112/62 108/64      Heart Rate (Admit) 80 bpm 73 bpm      Heart Rate (Exercise) 118 bpm 110 bpm      Heart Rate (Exit) 90 bpm 75 bpm      Oxygen Saturation (Admit) 99 % --      Oxygen Saturation (Exercise) 99 % --      Rating of Perceived Exertion (Exercise) 13 15      Symptoms shuffling feet, felt stumbly at end --      Comments walk test results first day             Progression   Progression -- Continue to progress workloads to maintain intensity without signs/symptoms of physical distress.      Average METs -- 2.59             Resistance  Training   Training Prescription -- Yes      Weight -- 3 lb      Reps -- 10-15             Treadmill   MPH -- 1.9      Grade -- 0.5      Minutes -- 15      METs -- 2.59             T5 Nustep   Level -- 1      SPM -- 80      Minutes -- 15             Exercise Comments:   Exercise Goals and Review:  Exercise Goals    Row Name 09/16/20 1522             Exercise Goals   Increase Physical Activity Yes       Intervention Provide advice, education, support and counseling about physical activity/exercise needs.;Develop an individualized exercise prescription for aerobic and resistive training based on initial evaluation findings, risk stratification, comorbidities and participant's personal goals.       Expected Outcomes Short Term: Attend rehab on a regular basis to increase amount of physical activity.;Long Term: Add in home exercise to make exercise part of routine and to increase amount of physical activity.;Long Term: Exercising regularly at least 3-5 days a week.       Increase Strength and Stamina Yes       Intervention Provide advice, education, support and counseling about physical activity/exercise needs.;Develop an individualized exercise prescription for aerobic and resistive training based on initial evaluation findings, risk stratification, comorbidities and participant's personal goals.  Expected Outcomes Short Term: Increase workloads from initial exercise prescription for resistance, speed, and METs.;Short Term: Perform resistance training exercises routinely during rehab and add in resistance training at home;Long Term: Improve cardiorespiratory fitness, muscular endurance and strength as measured by increased METs and functional capacity (6MWT)       Able to understand and use rate of perceived exertion (RPE) scale Yes       Intervention Provide education and explanation on how to use RPE scale       Expected Outcomes Short Term: Able to use RPE daily in  rehab to express subjective intensity level;Long Term:  Able to use RPE to guide intensity level when exercising independently       Able to understand and use Dyspnea scale Yes       Intervention Provide education and explanation on how to use Dyspnea scale       Expected Outcomes Short Term: Able to use Dyspnea scale daily in rehab to express subjective sense of shortness of breath during exertion;Long Term: Able to use Dyspnea scale to guide intensity level when exercising independently       Knowledge and understanding of Target Heart Rate Range (THRR) Yes       Intervention Provide education and explanation of THRR including how the numbers were predicted and where they are located for reference       Expected Outcomes Short Term: Able to state/look up THRR;Long Term: Able to use THRR to govern intensity when exercising independently;Short Term: Able to use daily as guideline for intensity in rehab       Able to check pulse independently Yes       Intervention Provide education and demonstration on how to check pulse in carotid and radial arteries.;Review the importance of being able to check your own pulse for safety during independent exercise       Expected Outcomes Short Term: Able to explain why pulse checking is important during independent exercise;Long Term: Able to check pulse independently and accurately       Understanding of Exercise Prescription Yes       Intervention Provide education, explanation, and written materials on patient's individual exercise prescription       Expected Outcomes Short Term: Able to explain program exercise prescription;Long Term: Able to explain home exercise prescription to exercise independently              Exercise Goals Re-Evaluation :  Exercise Goals Re-Evaluation    Row Name 09/17/20 1039             Exercise Goal Re-Evaluation   Exercise Goals Review Increase Physical Activity;Able to understand and use rate of perceived exertion (RPE)  scale;Knowledge and understanding of Target Heart Rate Range (THRR);Understanding of Exercise Prescription;Increase Strength and Stamina;Able to check pulse independently       Comments Reviewed RPE and dyspnea scales, THR and program prescription with pt today.  Pt voiced understanding and was given a copy of goals to take home.       Expected Outcomes Short: Use RPE daily to regulate intensity. Long: Follow program prescription in THR.              Discharge Exercise Prescription (Final Exercise Prescription Changes):  Exercise Prescription Changes - 09/21/20 0900      Response to Exercise   Blood Pressure (Admit) 124/74    Blood Pressure (Exercise) 116/64    Blood Pressure (Exit) 108/64    Heart Rate (Admit) 73 bpm  Heart Rate (Exercise) 110 bpm    Heart Rate (Exit) 75 bpm    Rating of Perceived Exertion (Exercise) 15    Comments first day      Progression   Progression Continue to progress workloads to maintain intensity without signs/symptoms of physical distress.    Average METs 2.59      Resistance Training   Training Prescription Yes    Weight 3 lb    Reps 10-15      Treadmill   MPH 1.9    Grade 0.5    Minutes 15    METs 2.59      T5 Nustep   Level 1    SPM 80    Minutes 15           Nutrition:  Target Goals: Understanding of nutrition guidelines, daily intake of sodium 1500mg , cholesterol 200mg , calories 30% from fat and 7% or less from saturated fats, daily to have 5 or more servings of fruits and vegetables.  Education: All About Nutrition: -Group instruction provided by verbal, written material, interactive activities, discussions, models, and posters to present general guidelines for heart healthy nutrition including fat, fiber, MyPlate, the role of sodium in heart healthy nutrition, utilization of the nutrition label, and utilization of this knowledge for meal planning. Follow up email sent as well. Written material given at graduation. Flowsheet  Row Cardiac Rehab from 09/24/2020 in [facility] Cardiac and Pulmonary Rehab  Education need identified 09/16/20      Biometrics:  Pre Biometrics - 09/16/20 1523      Pre Biometrics   Height 5' 8.7" (1.745 m)    Weight 192 lb (87.1 kg)    BMI (Calculated) 28.6    Single Leg Stand 6 seconds            Nutrition Therapy Plan and Nutrition Goals:  Nutrition Therapy & Goals - 09/29/20 1031      Nutrition Therapy   Diet Heart healthy, low Na, diabetes friendly    Drug/Food Interactions Statins/Certain Fruits    Protein (specify units) 70g    Fiber 30 grams    Whole Grain Foods 3 servings    Saturated Fats 12 max. grams    Fruits and Vegetables 8 servings/day    Sodium 1.5 grams      Personal Nutrition Goals   Nutrition Goal ST: try banana "nice cream" LT: continue with heart healthy changes    Comments 7.7 A1C. 7-8am BG: 115-140 (140 due to eating out). Eats out every once in a while. Uses monkfruit and stevia. Wife hardly ever puts salt in. Breakfast (2-6ZT): bacon 1 slice every other day, 1 egg - mushrooms, low fat and low Na cheese. (coffee - creamer (no sugar)). S: Tangerine or banana Lunch (12-12:30pm): has to have lunch at this time due to downward spiral. Low Na tuna for tuna salad with some mayo and relish with solo bread (3g net carbs) two gerkins pickles. Tortilla chips (low Na, low saturated fat) with salsa. S: peanut butter and apple or cheese or carrots/celery/broccoli or cheetos if he is having a bad day. Dinner: meat (pork loin, hamburger 97% lean, chicken - dark meat, and fish) and vegetables (broccoli, asparagus, cabbage, greens like collards, turnip roots, he does not like cook tomatoes, some potatoes, green beans, spaghetti squash, makes her own tomato sauce, zucchini tots, onions, cook beans every week. Limits red meat, uses olive oil and avocado oil, uses lots of herbs and mrs dash, they like salmon and tilapia. They  also have jello and whipped cream (small portion) with  dinner. Discussed heart healthy and diabetes friendly eating.      Intervention Plan   Intervention Prescribe, educate and counsel regarding individualized specific dietary modifications aiming towards targeted core components such as weight, hypertension, lipid management, diabetes, heart failure and other comorbidities.;Nutrition handout(s) given to patient.    Expected Outcomes Short Term Goal: Understand basic principles of dietary content, such as calories, fat, sodium, cholesterol and nutrients.;Short Term Goal: A plan has been developed with personal nutrition goals set during dietitian appointment.;Long Term Goal: Adherence to prescribed nutrition plan.           Nutrition Assessments:  MEDIFICTS Score Key:  ?70 Need to make dietary changes   40-70 Heart Healthy Diet  ? 40 Therapeutic Level Cholesterol Diet  Flowsheet Row Cardiac Rehab from 09/16/2020 in [facility] Cardiac and Pulmonary Rehab  Picture Your Plate Total Score on Admission 80     Picture Your Plate Scores:  <08 Unhealthy dietary pattern with much room for improvement.  41-50 Dietary pattern unlikely to meet recommendations for good health and room for improvement.  51-60 More healthful dietary pattern, with some room for improvement.   >60 Healthy dietary pattern, although there may be some specific behaviors that could be improved.    Nutrition Goals Re-Evaluation:   Nutrition Goals Discharge (Final Nutrition Goals Re-Evaluation):   Psychosocial: Target Goals: Acknowledge presence or absence of significant depression and/or stress, maximize coping skills, provide positive support system. Participant is able to verbalize types and ability to use techniques and skills needed for reducing stress and depression.   Education: Stress, Anxiety, and Depression - Group verbal and visual presentation to define topics covered.  Reviews how body is impacted by stress, anxiety, and depression.  Also discusses healthy  ways to reduce stress and to treat/manage anxiety and depression.  Written material given at graduation. Flowsheet Row Cardiac Rehab from 09/24/2020 in [facility] Cardiac and Pulmonary Rehab  Date 09/24/20  Educator [facility]  Instruction Review Code 1- [employer] Understanding      Education: Sleep Hygiene -Provides group verbal and written instruction about how sleep can affect your health.  Define sleep hygiene, discuss sleep cycles and impact of sleep habits. Review good sleep hygiene tips.    Initial Review & Psychosocial Screening:  Initial Psych Review & Screening - 09/10/20 1011      Initial Review   Current issues with Current Stress Concerns    Comments The just moved from [place] and are in a house that needs a bit of work and is stressing him out.      Family Dynamics   Good Support System? Yes    Comments He has his wfe and God for support. They wanted to move back to his home state and his wife enjoyed it here .He has a positive outlook on his health.      Barriers   Psychosocial barriers to participate in program The patient should benefit from training in stress management and relaxation.      Screening Interventions   Interventions To provide support and resources with identified psychosocial needs;Provide feedback about the scores to participant;Encouraged to exercise    Expected Outcomes Short Term goal: Utilizing psychosocial counselor, staff and physician to assist with identification of specific Stressors or current issues interfering with healing process. Setting desired goal for each stressor or current issue identified.;Long Term Goal: Stressors or current issues are controlled or eliminated.;Short Term goal: Identification and review with participant  of any Quality of Life or Depression concerns found by scoring the questionnaire.;Long Term goal: The participant improves quality of Life and PHQ9 Scores as seen by post scores and/or verbalization of changes            Quality of Life Scores:   Quality of Life - 09/16/20 1523      Quality of Life   Select Quality of Life      Quality of Life Scores   Health/Function Pre 24.33 %    Socioeconomic Pre 23.75 %    Psych/Spiritual Pre 29.64 %    Family Pre 26.4 %    GLOBAL Pre 25.56 %          Scores of 19 and below usually indicate a poorer quality of life in these areas.  A difference of  2-3 points is a clinically meaningful difference.  A difference of 2-3 points in the total score of the Quality of Life Index has been associated with significant improvement in overall quality of life, self-image, physical symptoms, and general health in studies assessing change in quality of life.  PHQ-9: Recent Review Flowsheet Data    Depression screen [facility] 2/9 09/16/2020   Decreased Interest 1   Down, Depressed, Hopeless 1   PHQ - 2 Score 2   Altered sleeping 0   Tired, decreased energy 1   Change in appetite 1   Feeling bad or failure about yourself  0   Trouble concentrating 1   Moving slowly or fidgety/restless 1   Suicidal thoughts 0   PHQ-9 Score 6   Difficult doing work/chores Not difficult at all     Interpretation of Total Score  Total Score Depression Severity:  1-4 = Minimal depression, 5-9 = Mild depression, 10-14 = Moderate depression, 15-19 = Moderately severe depression, 20-27 = Severe depression   Psychosocial Evaluation and Intervention:  Psychosocial Evaluation - 09/10/20 1016      Psychosocial Evaluation & Interventions   Interventions Encouraged to exercise with the program and follow exercise prescription;Relaxation education;Stress management education    Comments The just moved from [place] and are in a house that needs a bit of work and is stressing him out. He has his wfe and God for support. They wanted to move back to his home state and his wife enjoyed it here .He has a positive outlook on his health.    Expected Outcomes Short: Exercise regularly to support mental  health and notify staff of any changes. Long: maintain mental health and well being through teaching of rehab or prescribed medications independently.    Continue Psychosocial Services  Follow up required by staff           Psychosocial Re-Evaluation:   Psychosocial Discharge (Final Psychosocial Re-Evaluation):   Vocational Rehabilitation: Provide vocational rehab assistance to qualifying candidates.   Vocational Rehab Evaluation & Intervention:   Education: Education Goals: Education classes will be provided on a variety of topics geared toward better understanding of heart health and risk factor modification. Participant will state understanding/return demonstration of topics presented as noted by education test scores.  Learning Barriers/Preferences:  Learning Barriers/Preferences - 09/10/20 1009      Learning Barriers/Preferences   Learning Barriers None    Learning Preferences None           General Cardiac Education Topics:  AED/CPR: - Group verbal and written instruction with the use of models to demonstrate the basic use of the AED with the basic ABC's of resuscitation.  Anatomy and Cardiac Procedures: - Group verbal and visual presentation and models provide information about basic cardiac anatomy and function. Reviews the testing methods done to diagnose heart disease and the outcomes of the test results. Describes the treatment choices: Medical Management, Angioplasty, or Coronary Bypass Surgery for treating various heart conditions including Myocardial Infarction, Angina, Valve Disease, and Cardiac Arrhythmias.  Written material given at graduation. Flowsheet Row Cardiac Rehab from 09/24/2020 in [facility] Cardiac and Pulmonary Rehab  Education need identified 09/16/20      Medication Safety: - Group verbal and visual instruction to review commonly prescribed medications for heart and lung disease. Reviews the medication, class of the drug, and side effects.  Includes the steps to properly store meds and maintain the prescription regimen.  Written material given at graduation.   Intimacy: - Group verbal instruction through game format to discuss how heart and lung disease can affect sexual intimacy. Written material given at graduation..   Know Your Numbers and Heart Failure: - Group verbal and visual instruction to discuss disease risk factors for cardiac and pulmonary disease and treatment options.  Reviews associated critical values for Overweight/Obesity, Hypertension, Cholesterol, and Diabetes.  Discusses basics of heart failure: signs/symptoms and treatments.  Introduces Heart Failure Zone chart for action plan for heart failure.  Written material given at graduation.   Infection Prevention: - Provides verbal and written material to individual with discussion of infection control including proper hand washing and proper equipment cleaning during exercise session. Flowsheet Row Cardiac Rehab from 09/24/2020 in [facility] Cardiac and Pulmonary Rehab  Date 09/10/20  Educator [facility]  Instruction Review Code 1- Verbalizes Understanding      Falls Prevention: - Provides verbal and written material to individual with discussion of falls prevention and safety. Flowsheet Row Cardiac Rehab from 09/24/2020 in [facility] Cardiac and Pulmonary Rehab  Date 09/10/20  Educator [facility]  Instruction Review Code 1- Verbalizes Understanding      Other: -Provides group and verbal instruction on various topics (see comments)   Knowledge Questionnaire Score:  Knowledge Questionnaire Score - 09/16/20 1524      Knowledge Questionnaire Score   Pre Score 19/26 Education Focus: Angina, nurtrition, exercise           Core Components/Risk Factors/Patient Goals at Admission:  Personal Goals and Risk Factors at Admission - 09/16/20 1524      Core Components/Risk Factors/Patient Goals on Admission    Weight Management Yes;Weight Loss    Intervention Weight Management:  Develop a combined nutrition and exercise program designed to reach desired caloric intake, while maintaining appropriate intake of nutrient and fiber, sodium and fats, and appropriate energy expenditure required for the weight goal.;Weight Management: Provide education and appropriate resources to help participant work on and attain dietary goals.;Weight Management/Obesity: Establish reasonable short term and long term weight goals.    Admit Weight 192 lb (87.1 kg)    Goal Weight: Short Term 185 lb (83.9 kg)    Goal Weight: Long Term 180 lb (81.6 kg)    Expected Outcomes Short Term: Continue to assess and modify interventions until short term weight is achieved;Long Term: Adherence to nutrition and physical activity/exercise program aimed toward attainment of established weight goal;Weight Loss: Understanding of general recommendations for a balanced deficit meal plan, which promotes 1-2 lb weight loss per week and includes a negative energy balance of [phone] kcal/d;Understanding recommendations for meals to include 15-35% energy as protein, 25-35% energy from fat, 35-60% energy from carbohydrates, less than 200mg  of dietary cholesterol, 20-35  gm of total fiber daily;Understanding of distribution of calorie intake throughout the day with the consumption of 4-5 meals/snacks    Diabetes Yes    Intervention Provide education about signs/symptoms and action to take for hypo/hyperglycemia.;Provide education about proper nutrition, including hydration, and aerobic/resistive exercise prescription along with prescribed medications to achieve blood glucose in normal ranges: Fasting glucose 65-99 mg/dL    Expected Outcomes Short Term: Participant verbalizes understanding of the signs/symptoms and immediate care of hyper/hypoglycemia, proper foot care and importance of medication, aerobic/resistive exercise and nutrition plan for blood glucose control.;Long Term: Attainment of HbA1C < 7%.    Hypertension Yes     Intervention Provide education on lifestyle modifcations including regular physical activity/exercise, weight management, moderate sodium restriction and increased consumption of fresh fruit, vegetables, and low fat dairy, alcohol moderation, and smoking cessation.;Monitor prescription use compliance.    Expected Outcomes Short Term: Continued assessment and intervention until BP is < 140/21mm HG in hypertensive participants. < 130/2mm HG in hypertensive participants with diabetes, heart failure or chronic kidney disease.;Long Term: Maintenance of blood pressure at goal levels.    Lipids Yes    Intervention Provide education and support for participant on nutrition & aerobic/resistive exercise along with prescribed medications to achieve LDL 70mg , HDL >40mg .    Expected Outcomes Short Term: Participant states understanding of desired cholesterol values and is compliant with medications prescribed. Participant is following exercise prescription and nutrition guidelines.;Long Term: Cholesterol controlled with medications as prescribed, with individualized exercise RX and with personalized nutrition plan. Value goals: LDL < 70mg , HDL > 40 mg.           Education:Diabetes - Individual verbal and written instruction to review signs/symptoms of diabetes, desired ranges of glucose level fasting, after meals and with exercise. Acknowledge that pre and post exercise glucose checks will be done for 3 sessions at entry of program. [place] from 09/24/2020 in [facility] Cardiac and Pulmonary Rehab  Date 09/10/20  Educator [facility]  Instruction Review Code 1- Verbalizes Understanding      Core Components/Risk Factors/Patient Goals Review:    Core Components/Risk Factors/Patient Goals at Discharge (Final Review):    ITP Comments:  ITP Comments    [place] Name 09/10/20 1008 09/16/20 1514 09/17/20 1039 10/07/20 0901     ITP Comments Virtual Visit completed. Patient informed on EP and RD appointment  and 6 Minute walk test. Patient also informed of patient health questionnaires on My Chart. Patient Verbalizes understanding. Visit diagnosis can be found in [facility] 09/02/20. Completed 6MWT and gym orientation. Initial ITP created and sent for review to Dr. Emily Filbert, Medical Director. First full day of exercise!  Patient was oriented to gym and equipment including functions, settings, policies, and procedures.  Patient's individual exercise prescription and treatment plan were reviewed.  All starting workloads were established based on the results of the 6 minute walk test done at initial orientation visit.  The plan for exercise progression was also introduced and progression will be customized based on patient's performance and goals. 30 Day review completed. Medical Director ITP review done, changes made as directed, and signed approval by Medical Director.  New to program           Comments:

## 2020-10-08 ENCOUNTER — Other Ambulatory Visit: Payer: Self-pay

## 2020-10-08 DIAGNOSIS — Z955 Presence of coronary angioplasty implant and graft: Secondary | ICD-10-CM

## 2020-10-08 DIAGNOSIS — I213 ST elevation (STEMI) myocardial infarction of unspecified site: Secondary | ICD-10-CM | POA: Diagnosis not present

## 2020-10-08 NOTE — Progress Notes (Signed)
Daily Session Note  Patient Details  Name: Carlos Mathis MRN: [id number] Date of Birth: 19-Aug-1940 Referring Provider:   Flowsheet Row Cardiac Rehab from 09/16/2020 in [facility] Cardiac and Pulmonary Rehab  Referring Provider [name] MD      Encounter Date: 10/08/2020  Check In:  Session Check In - 10/08/20 1037      Check-In   Supervising physician immediately available to respond to emergencies See telemetry face sheet for immediately available ER MD    Location [facility]-Cardiac & Pulmonary Rehab    Staff Present Birdie Sons, MPA, RN;Melissa Caiola RDN, Rowe Pavy, BA, ACSM CEP, Exercise Physiologist;Meredith Sherryll Burger, RN BSN    Virtual Visit No    Medication changes reported     No    Fall or balance concerns reported    No    Warm-up and Cool-down Performed on first and last piece of equipment    Resistance Training Performed Yes    VAD Patient? No    PAD/SET Patient? No      Pain Assessment   Currently in Pain? No/denies              Social History   Tobacco Use  Smoking Status Former Smoker  . Packs/day: 1.00  . Years: 20.00  . Pack years: 20.00  . Types: Cigarettes  . Quit date: 09/11/1983  . Years since quitting: 37.1  Smokeless Tobacco Former Systems developer  . Types: Chew  . Quit date: 09/11/1983    Goals Met:  Independence with exercise equipment Exercise tolerated well No report of cardiac concerns or symptoms Strength training completed today  Goals Unmet:  Not Applicable  Comments: Pt able to follow exercise prescription today without complaint.  Will continue to monitor for progression.    Dr. Emily Filbert is Medical Director for [place] and [facility].

## 2020-10-13 ENCOUNTER — Other Ambulatory Visit: Payer: Self-pay

## 2020-10-13 DIAGNOSIS — Z955 Presence of coronary angioplasty implant and graft: Secondary | ICD-10-CM

## 2020-10-13 DIAGNOSIS — I213 ST elevation (STEMI) myocardial infarction of unspecified site: Secondary | ICD-10-CM | POA: Diagnosis not present

## 2020-10-13 NOTE — Progress Notes (Signed)
Daily Session Note  Patient Details  Name: Shaheen Mende MRN: [id number] Date of Birth: 08/20/1940 Referring Provider:   Flowsheet Row Cardiac Rehab from 09/16/2020 in [facility] Cardiac and Pulmonary Rehab  Referring Provider [name] MD      Encounter Date: 10/13/2020  Check In:  Session Check In - 10/13/20 1129      Check-In   Supervising physician immediately available to respond to emergencies See telemetry face sheet for immediately available ER MD    Location [facility]-Cardiac & Pulmonary Rehab    Staff Present Birdie Sons, MPA, RN;Melissa Caiola RDN, Rowe Pavy, BA, ACSM CEP, Exercise Physiologist;Joseph Tessie Fass RCP,RRT,BSRT    Virtual Visit No    Medication changes reported     No    Fall or balance concerns reported    No    Warm-up and Cool-down Performed on first and last piece of equipment    Resistance Training Performed Yes    VAD Patient? No    PAD/SET Patient? No      Pain Assessment   Currently in Pain? No/denies              Social History   Tobacco Use  Smoking Status Former Smoker  . Packs/day: 1.00  . Years: 20.00  . Pack years: 20.00  . Types: Cigarettes  . Quit date: 09/11/1983  . Years since quitting: 37.1  Smokeless Tobacco Former Systems developer  . Types: Chew  . Quit date: 09/11/1983    Goals Met:  Independence with exercise equipment Exercise tolerated well No report of cardiac concerns or symptoms Strength training completed today  Goals Unmet:  Not Applicable  Comments: Pt able to follow exercise prescription today without complaint.  Will continue to monitor for progression.    Dr. Emily Filbert is Medical Director for [place] and [facility].

## 2020-10-15 ENCOUNTER — Other Ambulatory Visit: Payer: Self-pay

## 2020-10-15 DIAGNOSIS — Z955 Presence of coronary angioplasty implant and graft: Secondary | ICD-10-CM

## 2020-10-15 DIAGNOSIS — I213 ST elevation (STEMI) myocardial infarction of unspecified site: Secondary | ICD-10-CM | POA: Diagnosis not present

## 2020-10-15 NOTE — Progress Notes (Signed)
Daily Session Note  Patient Details  Name: Carlos Mathis MRN: [id number] Date of Birth: [birth year] Referring Provider:   Flowsheet Row Cardiac Rehab from 09/16/2020 in [facility] Cardiac and Pulmonary Rehab  Referring Provider [name] MD      Encounter Date: 10/15/2020  Check In:  Session Check In - 10/15/20 1040      Check-In   Supervising physician immediately available to respond to emergencies See telemetry face sheet for immediately available ER MD    Location [facility]-Cardiac & Pulmonary Rehab    Staff Present Birdie Sons, MPA, RN;Melissa Caiola RDN, Rowe Pavy, BA, ACSM CEP, Exercise Physiologist    Virtual Visit No    Medication changes reported     No    Fall or balance concerns reported    No    Warm-up and Cool-down Performed on first and last piece of equipment    Resistance Training Performed Yes    VAD Patient? No    PAD/SET Patient? No      Pain Assessment   Currently in Pain? No/denies              Social History   Tobacco Use  Smoking Status Former Smoker  . Packs/day: 1.00  . Years: 20.00  . Pack years: 20.00  . Types: Cigarettes  . Quit date: 09/11/1983  . Years since quitting: 37.1  Smokeless Tobacco Former Systems developer  . Types: Chew  . Quit date: 09/11/1983    Goals Met:  Independence with exercise equipment Exercise tolerated well No report of cardiac concerns or symptoms Strength training completed today  Goals Unmet:  Not Applicable  Comments: Pt able to follow exercise prescription today without complaint.  Will continue to monitor for progression.    Dr. Emily Filbert is Medical Director for [place] and [facility].

## 2020-10-27 ENCOUNTER — Encounter: Payer: Medicare Other | Attending: Cardiology

## 2020-10-27 ENCOUNTER — Other Ambulatory Visit: Payer: Self-pay

## 2020-10-27 DIAGNOSIS — I213 ST elevation (STEMI) myocardial infarction of unspecified site: Secondary | ICD-10-CM | POA: Diagnosis present

## 2020-10-27 DIAGNOSIS — Z955 Presence of coronary angioplasty implant and graft: Secondary | ICD-10-CM | POA: Diagnosis not present

## 2020-10-27 NOTE — Progress Notes (Signed)
Daily Session Note  Patient Details  Name: Carlos Mathis MRN: [id number] Date of Birth: 1940-11-30 Referring Provider:   Flowsheet Row Cardiac Rehab from 09/16/2020 in [facility] Cardiac and Pulmonary Rehab  Referring Provider [name] MD      Encounter Date: 10/27/2020  Check In:  Session Check In - 10/27/20 1059      Check-In   Supervising physician immediately available to respond to emergencies See telemetry face sheet for immediately available ER MD    Location [facility]-Cardiac & Pulmonary Rehab    Staff Present Birdie Sons, MPA, RN;Melissa Caiola RDN, Rowe Pavy, BA, ACSM CEP, Exercise Physiologist;Joseph Tessie Fass RCP,RRT,BSRT    Virtual Visit No    Medication changes reported     No    Fall or balance concerns reported    No    Warm-up and Cool-down Performed on first and last piece of equipment    Resistance Training Performed Yes    VAD Patient? No    PAD/SET Patient? No      Pain Assessment   Currently in Pain? No/denies              Social History   Tobacco Use  Smoking Status Former Smoker  . Packs/day: 1.00  . Years: 20.00  . Pack years: 20.00  . Types: Cigarettes  . Quit date: 09/11/1983  . Years since quitting: 37.1  Smokeless Tobacco Former Systems developer  . Types: Chew  . Quit date: 09/11/1983    Goals Met:  Independence with exercise equipment Exercise tolerated well Personal goals reviewed No report of cardiac concerns or symptoms Strength training completed today  Goals Unmet:  Not Applicable  Comments: Pt able to follow exercise prescription today without complaint.  Will continue to monitor for progression.    Dr. Emily Filbert is Medical Director for [place] and [facility].

## 2020-10-29 ENCOUNTER — Other Ambulatory Visit: Payer: Self-pay

## 2020-10-29 DIAGNOSIS — I213 ST elevation (STEMI) myocardial infarction of unspecified site: Secondary | ICD-10-CM

## 2020-10-29 DIAGNOSIS — Z955 Presence of coronary angioplasty implant and graft: Secondary | ICD-10-CM | POA: Diagnosis not present

## 2020-10-29 NOTE — Progress Notes (Signed)
Daily Session Note  Patient Details  Name: Carlos Mathis MRN: [id number] Date of Birth: 12-14-1940 Referring Provider:   Flowsheet Row Cardiac Rehab from 09/16/2020 in [facility] Cardiac and Pulmonary Rehab  Referring Provider [name] MD      Encounter Date: 10/29/2020  Check In:  Session Check In - 10/29/20 1112      Check-In   Supervising physician immediately available to respond to emergencies See telemetry face sheet for immediately available ER MD    Location [facility]-Cardiac & Pulmonary Rehab    Staff Present Birdie Sons, MPA, RN;Joseph Darrin Nipper, MA, RCEP, CCRP, [place], [place], ACSM CEP, Exercise Physiologist    Virtual Visit No    Medication changes reported     No    Fall or balance concerns reported    No    Warm-up and Cool-down Performed on first and last piece of equipment    Resistance Training Performed Yes    VAD Patient? No    PAD/SET Patient? No      Pain Assessment   Currently in Pain? No/denies              Social History   Tobacco Use  Smoking Status Former Smoker  . Packs/day: 1.00  . Years: 20.00  . Pack years: 20.00  . Types: Cigarettes  . Quit date: 09/11/1983  . Years since quitting: 37.1  Smokeless Tobacco Former Systems developer  . Types: Chew  . Quit date: 09/11/1983    Goals Met:  Independence with exercise equipment Exercise tolerated well No report of cardiac concerns or symptoms Strength training completed today  Goals Unmet:  Not Applicable  Comments: Pt able to follow exercise prescription today without complaint.  Will continue to monitor for progression.    Dr. Emily Filbert is Medical Director for [place] and [facility].

## 2020-11-03 ENCOUNTER — Other Ambulatory Visit: Payer: Self-pay

## 2020-11-03 DIAGNOSIS — Z955 Presence of coronary angioplasty implant and graft: Secondary | ICD-10-CM | POA: Diagnosis not present

## 2020-11-03 DIAGNOSIS — I213 ST elevation (STEMI) myocardial infarction of unspecified site: Secondary | ICD-10-CM

## 2020-11-03 NOTE — Progress Notes (Signed)
Daily Session Note  Patient Details  Name: Carlos Mathis MRN: [id number] Date of Birth: 1940-09-01 Referring Provider:   Flowsheet Row Cardiac Rehab from 09/16/2020 in [facility] Cardiac and Pulmonary Rehab  Referring Provider [name] MD      Encounter Date: 11/03/2020  Check In:  Session Check In - 11/03/20 1130      Check-In   Supervising physician immediately available to respond to emergencies See telemetry face sheet for immediately available ER MD    Location [facility]-Cardiac & Pulmonary Rehab    Staff Present Birdie Sons, MPA, RN;Melissa Caiola RDN, LDN;Joseph Tessie Fass RCP,RRT,BSRT    Virtual Visit No    Medication changes reported     No    Fall or balance concerns reported    No    Warm-up and Cool-down Performed on first and last piece of equipment    Resistance Training Performed Yes    VAD Patient? No    PAD/SET Patient? No      Pain Assessment   Currently in Pain? No/denies              Social History   Tobacco Use  Smoking Status Former Smoker  . Packs/day: 1.00  . Years: 20.00  . Pack years: 20.00  . Types: Cigarettes  . Quit date: 09/11/1983  . Years since quitting: 37.1  Smokeless Tobacco Former Systems developer  . Types: Chew  . Quit date: 09/11/1983    Goals Met:  Independence with exercise equipment Exercise tolerated well No report of cardiac concerns or symptoms Strength training completed today  Goals Unmet:  Not Applicable  Comments: Pt able to follow exercise prescription today without complaint.  Will continue to monitor for progression.    Dr. Emily Filbert is Medical Director for [place] and [facility].

## 2020-11-04 ENCOUNTER — Encounter: Payer: Self-pay | Admitting: *Deleted

## 2020-11-04 DIAGNOSIS — Z955 Presence of coronary angioplasty implant and graft: Secondary | ICD-10-CM

## 2020-11-04 DIAGNOSIS — I213 ST elevation (STEMI) myocardial infarction of unspecified site: Secondary | ICD-10-CM

## 2020-11-04 NOTE — Progress Notes (Signed)
Cardiac Individual Treatment Plan  Patient Details  Name: Carlos Mathis MRN: [id number] Date of Birth: 02/23/1941 Referring Provider:   Flowsheet Row Cardiac Rehab from 09/16/2020 in [facility] Cardiac and Pulmonary Rehab  Referring Provider [name] MD      Initial Encounter Date:  Flowsheet Row Cardiac Rehab from 09/16/2020 in [facility] Cardiac and Pulmonary Rehab  Date 09/16/20      Visit Diagnosis: Status post coronary artery stent placement  ST elevation myocardial infarction (STEMI), unspecified artery ([place])  Patient's Home Medications on Admission:  Current Outpatient Medications:  .  aspirin 81 MG EC tablet, Take by mouth., Disp: , Rfl:  .  atorvastatin (LIPITOR) 40 MG tablet, Take 1 tablet by mouth 2 (two) times daily., Disp: , Rfl:  .  glipiZIDE (GLUCOTROL) 10 MG tablet, Take 1 tablet by mouth 2 (two) times daily., Disp: , Rfl:  .  glucose blood ([employer] BLOOD GLUCOSE TEST) test strip, USE 1 STRIP FOR TESTING AS DIRECTED EVERY DAY TO TEST BLOOD SUGAR, Disp: , Rfl:  .  glucose blood test strip, USE 1 STRIP FOR TESTING AS DIRECTED EVERY DAY TO TEST BLOOD SUGAR, Disp: , Rfl:  .  losartan (COZAAR) 25 MG tablet, Take 1 tablet by mouth daily., Disp: , Rfl:  .  metFORMIN (GLUMETZA) 500 MG (MOD) 24 hr tablet, Take 1 tablet by mouth daily., Disp: , Rfl:  .  metoprolol succinate (TOPROL-XL) 100 MG 24 hr tablet, 25 mg 1 day or 1 dose., Disp: , Rfl:  .  Multiple Vitamin (MULTIVITAMIN) capsule, Take 1 capsule by mouth daily., Disp: , Rfl:  .  nitroGLYCERIN (NITROSTAT) 0.4 MG SL tablet, Place under the tongue., Disp: , Rfl:  .  Omega-3 Fatty Acids (OMEGA-3 FISH OIL) 1200 MG CAPS, Take 1 tablet by mouth daily., Disp: , Rfl:  .  ranolazine (RANEXA) 500 MG 12 hr tablet, Take 1 tablet by mouth 2 (two) times daily., Disp: , Rfl:  .  Red Yeast Rice Extract (RED YEAST RICE PO), Take 1 tablet by mouth 2 (two) times daily., Disp: , Rfl:  .  ticagrelor (BRILINTA) 90 MG TABS tablet, Take 1 tablet  by mouth 2 (two) times daily., Disp: , Rfl:  .  Valerian Root 450 MG CAPS, Take by mouth., Disp: , Rfl:   Past Medical History: No past medical history on file.  Tobacco Use: Social History   Tobacco Use  Smoking Status Former Smoker  . Packs/day: 1.00  . Years: 20.00  . Pack years: 20.00  . Types: Cigarettes  . Quit date: 09/11/1983  . Years since quitting: 37.1  Smokeless Tobacco Former Systems developer  . Types: Chew  . Quit date: 09/11/1983    Labs: Recent Review Flowsheet Data   There is no flowsheet data to display.      Exercise Target Goals: Exercise Program Goal: Individual exercise prescription set using results from initial 6 min walk test and THRR while considering  patient's activity barriers and safety.   Exercise Prescription Goal: Initial exercise prescription builds to 30-45 minutes a day of aerobic activity, 2-3 days per week.  Home exercise guidelines will be given to patient during program as part of exercise prescription that the participant will acknowledge.   Education: Aerobic Exercise: - Group verbal and visual presentation on the components of exercise prescription. Introduces F.I.T.T principle from ACSM for exercise prescriptions.  Reviews F.I.T.T. principles of aerobic exercise including progression. Written material given at graduation. Flowsheet Row Cardiac Rehab from 10/29/2020 in [facility] Cardiac and Pulmonary  Rehab  Education need identified 09/16/20  Date 10/08/20  Educator Max  Instruction Review Code 1- Verbalizes Understanding      Education: Resistance Exercise: - Group verbal and visual presentation on the components of exercise prescription. Introduces F.I.T.T principle from ACSM for exercise prescriptions  Reviews F.I.T.T. principles of resistance exercise including progression. Written material given at graduation. Flowsheet Row Cardiac Rehab from 10/29/2020 in [facility] Cardiac and Pulmonary Rehab  Date 10/15/20  Educator [facility]  Instruction Review  Code 1- Verbalizes Understanding       Education: Exercise & Equipment Safety: - Individual verbal instruction and demonstration of equipment use and safety with use of the equipment. Flowsheet Row Cardiac Rehab from 10/29/2020 in [facility] Cardiac and Pulmonary Rehab  Date 09/10/20  Educator [facility]  Instruction Review Code 1- Verbalizes Understanding      Education: Exercise Physiology & General Exercise Guidelines: - Group verbal and written instruction with models to review the exercise physiology of the cardiovascular system and associated critical values. Provides general exercise guidelines with specific guidelines to those with heart or lung disease.    Education: Flexibility, Balance, Mind/Body Relaxation: - Group verbal and visual presentation with interactive activity on the components of exercise prescription. Introduces F.I.T.T principle from ACSM for exercise prescriptions. Reviews F.I.T.T. principles of flexibility and balance exercise training including progression. Also discusses the mind body connection.  Reviews various relaxation techniques to help reduce and manage stress (i.e. Deep breathing, progressive muscle relaxation, and visualization). Balance handout provided to take home. Written material given at graduation.   Activity Barriers & Risk Stratification:  Activity Barriers & Cardiac Risk Stratification - 09/16/20 1520      Activity Barriers & Cardiac Risk Stratification   Activity Barriers Right Knee Replacement;Arthritis;Joint Problems;Balance Concerns;History of Falls;Muscular Weakness;Shortness of Breath;Deconditioning;Back Problems   arth in back, bilateral knee pain   Cardiac Risk Stratification Moderate           6 Minute Walk:  6 Minute Walk    Row Name 09/16/20 1514         6 Minute Walk   Phase Initial     Distance 1165 feet     Walk Time 6 minutes     # of Rest Breaks 0     MPH 2.21     METS 2.45     RPE 13     VO2 Peak 8.57     Symptoms Yes  (comment)     Comments shuffling feet, felt stumbly at end     Resting HR 80 bpm     Resting BP 154/62     Resting Oxygen Saturation  99 %     Exercise Oxygen Saturation  during 6 min walk 99 %     Max Ex. HR 118 bpm     Max Ex. BP 148/74     2 Minute Post BP 112/62            Oxygen Initial Assessment:   Oxygen Re-Evaluation:   Oxygen Discharge (Final Oxygen Re-Evaluation):   Initial Exercise Prescription:  Initial Exercise Prescription - 09/16/20 1500      Date of Initial Exercise RX and Referring Provider   Date 09/16/20    Referring Provider [name] MD      Treadmill   MPH 1.9    Grade 0.5    Minutes 15    METs 2.59      REL-XR   Level 1    Speed 50    Minutes 15  METs 2.5      T5 Nustep   Level 1    SPM 80    Minutes 15    METs 2.5      Prescription Details   Frequency (times per week) 2    Duration Progress to 30 minutes of continuous aerobic without signs/symptoms of physical distress      Intensity   THRR 40-80% of Max Heartrate 104-129    Ratings of Perceived Exertion 11-13    Perceived Dyspnea 0-4      Progression   Progression Continue to progress workloads to maintain intensity without signs/symptoms of physical distress.      Resistance Training   Training Prescription Yes    Weight 3 lb    Reps 10-15           Perform Capillary Blood Glucose checks as needed.  Exercise Prescription Changes:  Exercise Prescription Changes    Row Name 09/16/20 1500 09/21/20 0900 10/07/20 1300 10/20/20 1300 11/03/20 1500     Response to Exercise   Blood Pressure (Admit) 154/62 124/74 110/60 104/60 106/60   Blood Pressure (Exercise) 148/74 116/64 118/62 136/74 122/60   Blood Pressure (Exit) 112/62 108/64 98/60 124/74 112/62   Heart Rate (Admit) 80 bpm 73 bpm 80 bpm 69 bpm 85 bpm   Heart Rate (Exercise) 118 bpm 110 bpm 120 bpm 84 bpm 102 bpm   Heart Rate (Exit) 90 bpm 75 bpm 88 bpm 78 bpm 82 bpm   Oxygen Saturation (Admit) 99 %  -- -- -- 96 %   Oxygen Saturation (Exercise) 99 % -- -- -- 95 %   Oxygen Saturation (Exit) -- -- -- -- 96 %   Rating of Perceived Exertion (Exercise) _0 Perceived Dyspnea (Exercise) -- -- -- -- 0   Symptoms shuffling feet, felt stumbly at end -- -- -- none   Comments walk test results first day -- -- --   Duration -- -- Continue with 30 min of aerobic exercise without signs/symptoms of physical distress. Continue with 30 min of aerobic exercise without signs/symptoms of physical distress. Continue with 30 min of aerobic exercise without signs/symptoms of physical distress.   Intensity -- -- THRR unchanged THRR unchanged THRR unchanged     Progression   Progression -- Continue to progress workloads to maintain intensity without signs/symptoms of physical distress. Continue to progress workloads to maintain intensity without signs/symptoms of physical distress. Continue to progress workloads to maintain intensity without signs/symptoms of physical distress. Continue to progress workloads to maintain intensity without signs/symptoms of physical distress.   Average METs -- 2.59 2.75 2.6 3.4     Resistance Training   Training Prescription -- Yes Yes Yes Yes   Weight -- 3 lb 4 lb 4 lb 4 lb   Reps -- 10-15 10-15 10-15 10-15     Interval Training   Interval Training -- -- -- -- No     Treadmill   MPH -- 1.9 1.9 -- --   Grade -- 0.5 0.5 -- --   Minutes -- 15 15 -- --   METs -- 2.59 2.59 -- --     Recumbant Bike   Level -- -- _1 Minutes -- -- _2 METs -- -- 2.9 2.93 3     NuStep   Level -- -- -- 5 6   Minutes -- -- -- 15 15   METs -- -- -- 2.3 3.8  T5 Nustep   Level -- 1 -- -- --   SPM -- 80 -- -- --   Minutes -- 15 -- -- --          Exercise Comments:   Exercise Goals and Review:  Exercise Goals    Row Name 09/16/20 1522             Exercise Goals   Increase Physical Activity Yes       Intervention Provide advice, education, support and  counseling about physical activity/exercise needs.;Develop an individualized exercise prescription for aerobic and resistive training based on initial evaluation findings, risk stratification, comorbidities and participant's personal goals.       Expected Outcomes Short Term: Attend rehab on a regular basis to increase amount of physical activity.;Long Term: Add in home exercise to make exercise part of routine and to increase amount of physical activity.;Long Term: Exercising regularly at least 3-5 days a week.       Increase Strength and Stamina Yes       Intervention Provide advice, education, support and counseling about physical activity/exercise needs.;Develop an individualized exercise prescription for aerobic and resistive training based on initial evaluation findings, risk stratification, comorbidities and participant's personal goals.       Expected Outcomes Short Term: Increase workloads from initial exercise prescription for resistance, speed, and METs.;Short Term: Perform resistance training exercises routinely during rehab and add in resistance training at home;Long Term: Improve cardiorespiratory fitness, muscular endurance and strength as measured by increased METs and functional capacity (6MWT)       Able to understand and use rate of perceived exertion (RPE) scale Yes       Intervention Provide education and explanation on how to use RPE scale       Expected Outcomes Short Term: Able to use RPE daily in rehab to express subjective intensity level;Long Term:  Able to use RPE to guide intensity level when exercising independently       Able to understand and use Dyspnea scale Yes       Intervention Provide education and explanation on how to use Dyspnea scale       Expected Outcomes Short Term: Able to use Dyspnea scale daily in rehab to express subjective sense of shortness of breath during exertion;Long Term: Able to use Dyspnea scale to guide intensity level when exercising independently        Knowledge and understanding of Target Heart Rate Range (THRR) Yes       Intervention Provide education and explanation of THRR including how the numbers were predicted and where they are located for reference       Expected Outcomes Short Term: Able to state/look up THRR;Long Term: Able to use THRR to govern intensity when exercising independently;Short Term: Able to use daily as guideline for intensity in rehab       Able to check pulse independently Yes       Intervention Provide education and demonstration on how to check pulse in carotid and radial arteries.;Review the importance of being able to check your own pulse for safety during independent exercise       Expected Outcomes Short Term: Able to explain why pulse checking is important during independent exercise;Long Term: Able to check pulse independently and accurately       Understanding of Exercise Prescription Yes       Intervention Provide education, explanation, and written materials on patient's individual exercise prescription       Expected Outcomes Short  Term: Able to explain program exercise prescription;Long Term: Able to explain home exercise prescription to exercise independently              Exercise Goals Re-Evaluation :  Exercise Goals Re-Evaluation    Row Name 09/17/20 1039 10/07/20 1332 10/20/20 1310 11/03/20 1539       Exercise Goal Re-Evaluation   Exercise Goals Review Increase Physical Activity;Able to understand and use rate of perceived exertion (RPE) scale;Knowledge and understanding of Target Heart Rate Range (THRR);Understanding of Exercise Prescription;Increase Strength and Stamina;Able to check pulse independently Increase Physical Activity;Increase Strength and Stamina Increase Physical Activity;Increase Strength and Stamina Increase Physical Activity;Increase Strength and Stamina;Understanding of Exercise Prescription    Comments Reviewed RPE and dyspnea scales, THR and program prescription with pt  today.  Pt voiced understanding and was given a copy of goals to take home. Carlos Mathis is tolerating exercise well and reaches his THR range.  He has worked up to prescribed speed on TM.  We will continue to monitor progress. Carlos Mathis attends consistently.  Staff will encourage trying 5 lb for strength work. Carlos Mathis is doing well in rehab.  He is now on level 6 for the NuStep.  We will continue to montior his progress.    Expected Outcomes Short: Use RPE daily to regulate intensity. Long: Follow program prescription in THR. Short:  continue to follow program prescription Long: improve overall stamina Short: maintain consistent attendance Long:  increase MET level Short: Try to increase handweights Long: Continue to improve stamina           Discharge Exercise Prescription (Final Exercise Prescription Changes):  Exercise Prescription Changes - 11/03/20 1500      Response to Exercise   Blood Pressure (Admit) 106/60    Blood Pressure (Exercise) 122/60    Blood Pressure (Exit) 112/62    Heart Rate (Admit) 85 bpm    Heart Rate (Exercise) 102 bpm    Heart Rate (Exit) 82 bpm    Oxygen Saturation (Admit) 96 %    Oxygen Saturation (Exercise) 95 %    Oxygen Saturation (Exit) 96 %    Rating of Perceived Exertion (Exercise) 13    Perceived Dyspnea (Exercise) 0    Symptoms none    Duration Continue with 30 min of aerobic exercise without signs/symptoms of physical distress.    Intensity THRR unchanged      Progression   Progression Continue to progress workloads to maintain intensity without signs/symptoms of physical distress.    Average METs 3.4      Resistance Training   Training Prescription Yes    Weight 4 lb    Reps 10-15      Interval Training   Interval Training No      Recumbant Bike   Level 2    Minutes 15    METs 3      NuStep   Level 6    Minutes 15    METs 3.8           Nutrition:  Target Goals: Understanding of nutrition guidelines, daily intake of sodium <1583m, cholesterol  <2051m calories 30% from fat and 7% or less from saturated fats, daily to have 5 or more servings of fruits and vegetables.  Education: All About Nutrition: -Group instruction provided by verbal, written material, interactive activities, discussions, models, and posters to present general guidelines for heart healthy nutrition including fat, fiber, MyPlate, the role of sodium in heart healthy nutrition, utilization of the nutrition label, and utilization of  this knowledge for meal planning. Follow up email sent as well. Written material given at graduation. Flowsheet Row Cardiac Rehab from 10/29/2020 in [facility] Cardiac and Pulmonary Rehab  Education need identified 09/16/20  Date 10/29/20  Educator [place]  Instruction Review Code 1- Verbalizes Understanding      Biometrics:  Pre Biometrics - 09/16/20 1523      Pre Biometrics   Height 5' 8.7" (1.745 m)    Weight 192 lb (87.1 kg)    BMI (Calculated) 28.6    Single Leg Stand 6 seconds            Nutrition Therapy Plan and Nutrition Goals:  Nutrition Therapy & Goals - 09/29/20 1031      Nutrition Therapy   Diet Heart healthy, low Na, diabetes friendly    Drug/Food Interactions Statins/Certain Fruits    Protein (specify units) 70g    Fiber 30 grams    Whole Grain Foods 3 servings    Saturated Fats 12 max. grams    Fruits and Vegetables 8 servings/day    Sodium 1.5 grams      Personal Nutrition Goals   Nutrition Goal ST: try banana "nice cream" LT: continue with heart healthy changes    Comments 7.7 A1C. 7-8am BG: 115-140 (140 due to eating out). Eats out every once in a while. Uses monkfruit and stevia. Wife hardly ever puts salt in. Breakfast (8-3JA): bacon 1 slice every other day, 1 egg - mushrooms, low fat and low Na cheese. (coffee - creamer (no sugar)). S: Tangerine or banana Lunch (12-12:30pm): has to have lunch at this time due to downward spiral. Low Na tuna for tuna salad with some mayo and relish with solo bread (3g net carbs)  two gerkins pickles. Tortilla chips (low Na, low saturated fat) with salsa. S: peanut butter and apple or cheese or carrots/celery/broccoli or cheetos if he is having a bad day. Dinner: meat (pork loin, hamburger 97% lean, chicken - dark meat, and fish) and vegetables (broccoli, asparagus, cabbage, greens like collards, turnip roots, he does not like cook tomatoes, some potatoes, green beans, spaghetti squash, makes her own tomato sauce, zucchini tots, onions, cook beans every week. Limits red meat, uses olive oil and avocado oil, uses lots of herbs and mrs dash, they like salmon and tilapia. They also have jello and whipped cream (small portion) with dinner. Discussed heart healthy and diabetes friendly eating.      Intervention Plan   Intervention Prescribe, educate and counsel regarding individualized specific dietary modifications aiming towards targeted core components such as weight, hypertension, lipid management, diabetes, heart failure and other comorbidities.;Nutrition handout(s) given to patient.    Expected Outcomes Short Term Goal: Understand basic principles of dietary content, such as calories, fat, sodium, cholesterol and nutrients.;Short Term Goal: A plan has been developed with personal nutrition goals set during dietitian appointment.;Long Term Goal: Adherence to prescribed nutrition plan.           Nutrition Assessments:  MEDIFICTS Score Key:  ?70 Need to make dietary changes   40-70 Heart Healthy Diet  ? 40 Therapeutic Level Cholesterol Diet  Flowsheet Row Cardiac Rehab from 09/16/2020 in [facility] Cardiac and Pulmonary Rehab  Picture Your Plate Total Score on Admission 80     Picture Your Plate Scores:  <25 Unhealthy dietary pattern with much room for improvement.  41-50 Dietary pattern unlikely to meet recommendations for good health and room for improvement.  51-60 More healthful dietary pattern, with some room for improvement.   >  60 Healthy dietary pattern, although  there may be some specific behaviors that could be improved.    Nutrition Goals Re-Evaluation:  Nutrition Goals Re-Evaluation    [place] Name 10/27/20 1109             Goals   Current Weight 186 lb (84.4 kg)       Nutrition Goal Eat lower sodium. Stay away from unhealthy foods.       Comment Jims wife has been maintaining his diet and he feels like he eats well. His diabetes is under control and has no issues with his sugar. He states he is drinking enough water.       Expected Outcome Short: continue heart healthy diet. Long: maintain his sugars and diet independently.              Nutrition Goals Discharge (Final Nutrition Goals Re-Evaluation):  Nutrition Goals Re-Evaluation - 10/27/20 1109      Goals   Current Weight 186 lb (84.4 kg)    Nutrition Goal Eat lower sodium. Stay away from unhealthy foods.    Comment Jims wife has been maintaining his diet and he feels like he eats well. His diabetes is under control and has no issues with his sugar. He states he is drinking enough water.    Expected Outcome Short: continue heart healthy diet. Long: maintain his sugars and diet independently.           Psychosocial: Target Goals: Acknowledge presence or absence of significant depression and/or stress, maximize coping skills, provide positive support system. Participant is able to verbalize types and ability to use techniques and skills needed for reducing stress and depression.   Education: Stress, Anxiety, and Depression - Group verbal and visual presentation to define topics covered.  Reviews how body is impacted by stress, anxiety, and depression.  Also discusses healthy ways to reduce stress and to treat/manage anxiety and depression.  Written material given at graduation. Flowsheet Row Cardiac Rehab from 10/29/2020 in [facility] Cardiac and Pulmonary Rehab  Date 09/24/20  Educator [facility]  Instruction Review Code 1- [employer] Understanding      Education: Sleep Hygiene -Provides  group verbal and written instruction about how sleep can affect your health.  Define sleep hygiene, discuss sleep cycles and impact of sleep habits. Review good sleep hygiene tips.    Initial Review & Psychosocial Screening:  Initial Psych Review & Screening - 09/10/20 1011      Initial Review   Current issues with Current Stress Concerns    Comments The just moved from [place] and are in a house that needs a bit of work and is stressing him out.      Family Dynamics   Good Support System? Yes    Comments He has his wfe and God for support. They wanted to move back to his home state and his wife enjoyed it here .He has a positive outlook on his health.      Barriers   Psychosocial barriers to participate in program The patient should benefit from training in stress management and relaxation.      Screening Interventions   Interventions To provide support and resources with identified psychosocial needs;Provide feedback about the scores to participant;Encouraged to exercise    Expected Outcomes Short Term goal: Utilizing psychosocial counselor, staff and physician to assist with identification of specific Stressors or current issues interfering with healing process. Setting desired goal for each stressor or current issue identified.;Long Term Goal: Stressors or current issues are  controlled or eliminated.;Short Term goal: Identification and review with participant of any Quality of Life or Depression concerns found by scoring the questionnaire.;Long Term goal: The participant improves quality of Life and PHQ9 Scores as seen by post scores and/or verbalization of changes           Quality of Life Scores:   Quality of Life - 09/16/20 1523      Quality of Life   Select Quality of Life      Quality of Life Scores   Health/Function Pre 24.33 %    Socioeconomic Pre 23.75 %    Psych/Spiritual Pre 29.64 %    Family Pre 26.4 %    GLOBAL Pre 25.56 %          Scores of 19 and below  usually indicate a poorer quality of life in these areas.  A difference of  2-3 points is a clinically meaningful difference.  A difference of 2-3 points in the total score of the Quality of Life Index has been associated with significant improvement in overall quality of life, self-image, physical symptoms, and general health in studies assessing change in quality of life.  PHQ-9: Recent Review Flowsheet Data    Depression screen [facility] 2/9 10/27/2020 09/16/2020   Decreased Interest 0 1   Down, Depressed, Hopeless 0 1   PHQ - 2 Score 0 2   Altered sleeping 0 0   Tired, decreased energy 0 1   Change in appetite 1 1   Feeling bad or failure about yourself  0 0   Trouble concentrating 3 1   Moving slowly or fidgety/restless 0 1   Suicidal thoughts 0 0   PHQ-9 Score 4 6   Difficult doing work/chores Not difficult at all Not difficult at all     Interpretation of Total Score  Total Score Depression Severity:  1-4 = Minimal depression, 5-9 = Mild depression, 10-14 = Moderate depression, 15-19 = Moderately severe depression, 20-27 = Severe depression   Psychosocial Evaluation and Intervention:  Psychosocial Evaluation - 09/10/20 1016      Psychosocial Evaluation & Interventions   Interventions Encouraged to exercise with the program and follow exercise prescription;Relaxation education;Stress management education    Comments The just moved from [place] and are in a house that needs a bit of work and is stressing him out. He has his wfe and God for support. They wanted to move back to his home state and his wife enjoyed it here .He has a positive outlook on his health.    Expected Outcomes Short: Exercise regularly to support mental health and notify staff of any changes. Long: maintain mental health and well being through teaching of rehab or prescribed medications independently.    Continue Psychosocial Services  Follow up required by staff           Psychosocial Re-Evaluation:   Psychosocial Re-Evaluation    [place] Name 10/27/20 1116             Psychosocial Re-Evaluation   Current issues with None Identified       Comments Reviewed patient health questionnaire (PHQ-9) with patient for follow up. Previously, patients score indicated signs/symptoms of depression.  Reviewed to see if patient is improving symptom wise while in program.  Score improved and patient states that it is because he has been able to have more energy and exercise..       Expected Outcomes Short: Continue to attend [facility] regularly for regular exercise and social engagement.  Long: Continue to improve symptoms and manage a positive mental state.       Interventions Encouraged to attend Cardiac Rehabilitation for the exercise       Continue Psychosocial Services  No Follow up required              Psychosocial Discharge (Final Psychosocial Re-Evaluation):  Psychosocial Re-Evaluation - 10/27/20 1116      Psychosocial Re-Evaluation   Current issues with None Identified    Comments Reviewed patient health questionnaire (PHQ-9) with patient for follow up. Previously, patients score indicated signs/symptoms of depression.  Reviewed to see if patient is improving symptom wise while in program.  Score improved and patient states that it is because he has been able to have more energy and exercise..    Expected Outcomes Short: Continue to attend [facility] regularly for regular exercise and social engagement. Long: Continue to improve symptoms and manage a positive mental state.    Interventions Encouraged to attend Cardiac Rehabilitation for the exercise    Continue Psychosocial Services  No Follow up required           Vocational Rehabilitation: Provide vocational rehab assistance to qualifying candidates.   Vocational Rehab Evaluation & Intervention:   Education: Education Goals: Education classes will be provided on a variety of topics geared toward better understanding of heart health  and risk factor modification. Participant will state understanding/return demonstration of topics presented as noted by education test scores.  Learning Barriers/Preferences:  Learning Barriers/Preferences - 09/10/20 1009      Learning Barriers/Preferences   Learning Barriers None    Learning Preferences None           General Cardiac Education Topics:  AED/CPR: - Group verbal and written instruction with the use of models to demonstrate the basic use of the AED with the basic ABC's of resuscitation.   Anatomy and Cardiac Procedures: - Group verbal and visual presentation and models provide information about basic cardiac anatomy and function. Reviews the testing methods done to diagnose heart disease and the outcomes of the test results. Describes the treatment choices: Medical Management, Angioplasty, or Coronary Bypass Surgery for treating various heart conditions including Myocardial Infarction, Angina, Valve Disease, and Cardiac Arrhythmias.  Written material given at graduation. Flowsheet Row Cardiac Rehab from 10/29/2020 in [facility] Cardiac and Pulmonary Rehab  Education need identified 09/16/20  Date 10/15/20  Educator [facility]  Instruction Review Code 1- Verbalizes Understanding      Medication Safety: - Group verbal and visual instruction to review commonly prescribed medications for heart and lung disease. Reviews the medication, class of the drug, and side effects. Includes the steps to properly store meds and maintain the prescription regimen.  Written material given at graduation.   Intimacy: - Group verbal instruction through game format to discuss how heart and lung disease can affect sexual intimacy. Written material given at graduation.. Flowsheet Row Cardiac Rehab from 10/29/2020 in [facility] Cardiac and Pulmonary Rehab  Date 10/08/20  Educator [place]  Instruction Review Code 1- Verbalizes Understanding      Know Your Numbers and Heart Failure: - Group verbal and visual  instruction to discuss disease risk factors for cardiac and pulmonary disease and treatment options.  Reviews associated critical values for Overweight/Obesity, Hypertension, Cholesterol, and Diabetes.  Discusses basics of heart failure: signs/symptoms and treatments.  Introduces Heart Failure Zone chart for action plan for heart failure.  Written material given at graduation.   Infection Prevention: - Provides verbal and written material to individual  with discussion of infection control including proper hand washing and proper equipment cleaning during exercise session. Flowsheet Row Cardiac Rehab from 10/29/2020 in [facility] Cardiac and Pulmonary Rehab  Date 09/10/20  Educator [facility]  Instruction Review Code 1- Verbalizes Understanding      Falls Prevention: - Provides verbal and written material to individual with discussion of falls prevention and safety. Flowsheet Row Cardiac Rehab from 10/29/2020 in [facility] Cardiac and Pulmonary Rehab  Date 09/10/20  Educator [facility]  Instruction Review Code 1- Verbalizes Understanding      Other: -Provides group and verbal instruction on various topics (see comments)   Knowledge Questionnaire Score:  Knowledge Questionnaire Score - 09/16/20 1524      Knowledge Questionnaire Score   Pre Score 19/26 Education Focus: Angina, nurtrition, exercise           Core Components/Risk Factors/Patient Goals at Admission:  Personal Goals and Risk Factors at Admission - 09/16/20 1524      Core Components/Risk Factors/Patient Goals on Admission    Weight Management Yes;Weight Loss    Intervention Weight Management: Develop a combined nutrition and exercise program designed to reach desired caloric intake, while maintaining appropriate intake of nutrient and fiber, sodium and fats, and appropriate energy expenditure required for the weight goal.;Weight Management: Provide education and appropriate resources to help participant work on and attain dietary goals.;Weight  Management/Obesity: Establish reasonable short term and long term weight goals.    Admit Weight 192 lb (87.1 kg)    Goal Weight: Short Term 185 lb (83.9 kg)    Goal Weight: Long Term 180 lb (81.6 kg)    Expected Outcomes Short Term: Continue to assess and modify interventions until short term weight is achieved;Long Term: Adherence to nutrition and physical activity/exercise program aimed toward attainment of established weight goal;Weight Loss: Understanding of general recommendations for a balanced deficit meal plan, which promotes 1-2 lb weight loss per week and includes a negative energy balance of [id number] kcal/d;Understanding recommendations for meals to include 15-35% energy as protein, 25-35% energy from fat, 35-60% energy from carbohydrates, less than 250m of dietary cholesterol, 20-35 gm of total fiber daily;Understanding of distribution of calorie intake throughout the day with the consumption of 4-5 meals/snacks    Diabetes Yes    Intervention Provide education about signs/symptoms and action to take for hypo/hyperglycemia.;Provide education about proper nutrition, including hydration, and aerobic/resistive exercise prescription along with prescribed medications to achieve blood glucose in normal ranges: Fasting glucose 65-99 mg/dL    Expected Outcomes Short Term: Participant verbalizes understanding of the signs/symptoms and immediate care of hyper/hypoglycemia, proper foot care and importance of medication, aerobic/resistive exercise and nutrition plan for blood glucose control.;Long Term: Attainment of HbA1C < 7%.    Hypertension Yes    Intervention Provide education on lifestyle modifcations including regular physical activity/exercise, weight management, moderate sodium restriction and increased consumption of fresh fruit, vegetables, and low fat dairy, alcohol moderation, and smoking cessation.;Monitor prescription use compliance.    Expected Outcomes Short Term: Continued assessment and  intervention until BP is < 140/965mHG in hypertensive participants. < 130/8044mG in hypertensive participants with diabetes, heart failure or chronic kidney disease.;Long Term: Maintenance of blood pressure at goal levels.    Lipids Yes    Intervention Provide education and support for participant on nutrition & aerobic/resistive exercise along with prescribed medications to achieve LDL <1m46mDL >40mg74m Expected Outcomes Short Term: Participant states understanding of desired cholesterol values and is compliant with medications prescribed. Participant  is following exercise prescription and nutrition guidelines.;Long Term: Cholesterol controlled with medications as prescribed, with individualized exercise RX and with personalized nutrition plan. Value goals: LDL < 32m, HDL > 40 mg.           Education:Diabetes - Individual verbal and written instruction to review signs/symptoms of diabetes, desired ranges of glucose level fasting, after meals and with exercise. Acknowledge that pre and post exercise glucose checks will be done for 3 sessions at entry of program. [place] [facility]  Date 09/10/20  [facility] Instruction Review Code 1- Verbalizes Understanding      Core Components/Risk Factors/Patient Goals Review:   Goals and Risk Factor Review    Row Name 10/27/20 1107             Core Components/Risk Factors/Patient Goals Review   Personal Goals Review Weight Management/Obesity;Hypertension;Heart Failure       Review JClair Gullingwants to lose more weight. He was 186 pounds today. He was 192 pounds when he started the program. His weight goal is to be below 180 pounds.       Expected Outcomes Short: lose more weight. Long: reach weight goal of 180 pounds.              Core Components/Risk Factors/Patient Goals at Discharge (Final Review):   Goals and Risk Factor Review - 10/27/20 1107      Core Components/Risk  Factors/Patient Goals Review   Personal Goals Review Weight Management/Obesity;Hypertension;Heart Failure    Review JClair Gullingwants to lose more weight. He was 186 pounds today. He was 192 pounds when he started the program. His weight goal is to be below 180 pounds.    Expected Outcomes Short: lose more weight. Long: reach weight goal of 180 pounds.           ITP Comments:  ITP Comments    Row Name 09/10/20 1008 09/16/20 1514 09/17/20 1039 10/07/20 0901 11/04/20 00998  ITP Comments Virtual Visit completed. Patient informed on EP and RD appointment and 6 Minute walk test. Patient also informed of patient health questionnaires on My Chart. Patient Verbalizes understanding. [facility]. Completed 6MWT and gym orientation. Initial ITP created and sent for review to Dr. MEmily Filbert Medical Director. First full day of exercise!  Patient was oriented to gym and equipment including functions, settings, policies, and procedures.  Patient's individual exercise prescription and treatment plan were reviewed.  All starting workloads were established based on the results of the 6 minute walk test done at initial orientation visit.  The plan for exercise progression was also introduced and progression will be customized based on patient's performance and goals. 30 Day review completed. Medical Director ITP review done, changes made as directed, and signed approval by Medical Director.  New to program 30 Day review completed. Medical Director ITP review done, changes made as directed, and signed approval by Medical Director.   2 visits in May          Comments:

## 2020-11-05 ENCOUNTER — Other Ambulatory Visit: Payer: Self-pay

## 2020-11-05 DIAGNOSIS — Z955 Presence of coronary angioplasty implant and graft: Secondary | ICD-10-CM

## 2020-11-05 DIAGNOSIS — I213 ST elevation (STEMI) myocardial infarction of unspecified site: Secondary | ICD-10-CM

## 2020-11-05 NOTE — Progress Notes (Signed)
Daily Session Note  Patient Details  Name: Carlos Mathis MRN: [id number] Date of Birth: March 02, 1941 Referring Provider:   Flowsheet Row Cardiac Rehab from 09/16/2020 in [facility] Cardiac and Pulmonary Rehab  Referring Provider Carlos Cowman MD      Encounter Date: 11/05/2020  Check In:  Session Check In - 11/05/20 1045      Check-In   Supervising physician immediately available to respond to emergencies See telemetry face sheet for immediately available ER MD    Location [facility]-Cardiac & Pulmonary Rehab    Staff Present Birdie Sons, MPA, Elveria Rising, BA, ACSM CEP, Exercise Physiologist;Melissa Caiola RDN, LDN    Virtual Visit No    Medication changes reported     No    Fall or balance concerns reported    No    Warm-up and Cool-down Performed on first and last piece of equipment    Resistance Training Performed Yes    VAD Patient? No    PAD/SET Patient? No      Pain Assessment   Currently in Pain? No/denies              Social History   Tobacco Use  Smoking Status Former Smoker  . Packs/day: 1.00  . Years: 20.00  . Pack years: 20.00  . Types: Cigarettes  . Quit date: 09/11/1983  . Years since quitting: 37.1  Smokeless Tobacco Former Systems developer  . Types: Chew  . Quit date: 09/11/1983    Goals Met:  Independence with exercise equipment Exercise tolerated well No report of cardiac concerns or symptoms Strength training completed today  Goals Unmet:  Not Applicable  Comments: Pt able to follow exercise prescription today without complaint.  Will continue to monitor for progression.    Dr. Emily Mathis is Medical Director for [facility] Pulmonary Rehabilitation.

## 2020-11-10 ENCOUNTER — Other Ambulatory Visit: Payer: Self-pay

## 2020-11-10 DIAGNOSIS — Z955 Presence of coronary angioplasty implant and graft: Secondary | ICD-10-CM | POA: Diagnosis not present

## 2020-11-10 DIAGNOSIS — I213 ST elevation (STEMI) myocardial infarction of unspecified site: Secondary | ICD-10-CM

## 2020-11-10 NOTE — Progress Notes (Signed)
Daily Session Note  Patient Details  Name: Carlos Mathis MRN: [id number] Date of Birth: 1941/02/09 Referring Provider:   Flowsheet Row Cardiac Rehab from 09/16/2020 in [facility] Cardiac and Pulmonary Rehab  Referring Provider [name] MD      Encounter Date: 11/10/2020  Check In:  Session Check In - 11/10/20 1134      Check-In   Supervising physician immediately available to respond to emergencies See telemetry face sheet for immediately available ER MD    Location [facility]-Cardiac & Pulmonary Rehab    Staff Present Birdie Sons, MPA, RN;Melissa Caiola RDN, Rowe Pavy, BA, ACSM CEP, Exercise Physiologist;Joseph Tessie Fass RCP,RRT,BSRT    Virtual Visit No    Medication changes reported     No    Fall or balance concerns reported    No    Warm-up and Cool-down Performed on first and last piece of equipment    Resistance Training Performed Yes    VAD Patient? No    PAD/SET Patient? No      Pain Assessment   Currently in Pain? No/denies              Social History   Tobacco Use  Smoking Status Former Smoker  . Packs/day: 1.00  . Years: 20.00  . Pack years: 20.00  . Types: Cigarettes  . Quit date: 09/11/1983  . Years since quitting: 37.1  Smokeless Tobacco Former Systems developer  . Types: Chew  . Quit date: 09/11/1983    Goals Met:  Independence with exercise equipment Exercise tolerated well No report of cardiac concerns or symptoms Strength training completed today  Goals Unmet:  Not Applicable  Comments: Pt able to follow exercise prescription today without complaint.  Will continue to monitor for progression.    Dr. Emily Filbert is Medical Director for [place].  Dr. Ottie Glazier is Medical Director for [facility] Pulmonary Rehabilitation.

## 2020-11-12 ENCOUNTER — Other Ambulatory Visit: Payer: Self-pay

## 2020-11-12 DIAGNOSIS — Z955 Presence of coronary angioplasty implant and graft: Secondary | ICD-10-CM

## 2020-11-12 DIAGNOSIS — I213 ST elevation (STEMI) myocardial infarction of unspecified site: Secondary | ICD-10-CM

## 2020-11-12 NOTE — Progress Notes (Signed)
Daily Session Note  Patient Details  Name: Carlos Mathis MRN: [id number] Date of Birth: [birth year] Referring Provider:   Flowsheet Row Cardiac Rehab from 09/16/2020 in [facility] Cardiac and Pulmonary Rehab  Referring Provider [name] MD      Encounter Date: 11/12/2020  Check In:  Session Check In - 11/12/20 1116      Check-In   Supervising physician immediately available to respond to emergencies See telemetry face sheet for immediately available ER MD    Location [facility]-Cardiac & Pulmonary Rehab    Staff Present Birdie Sons, MPA, RN;Melissa Caiola RDN, LDN;Jessica Luan Pulling, MA, RCEP, CCRP, CCET    Virtual Visit No    Medication changes reported     No    Fall or balance concerns reported    No    Warm-up and Cool-down Performed on first and last piece of equipment    Resistance Training Performed Yes    VAD Patient? No    PAD/SET Patient? No      Pain Assessment   Currently in Pain? No/denies              Social History   Tobacco Use  Smoking Status Former Smoker  . Packs/day: 1.00  . Years: 20.00  . Pack years: 20.00  . Types: Cigarettes  . Quit date: 09/11/1983  . Years since quitting: 37.1  Smokeless Tobacco Former Systems developer  . Types: Chew  . Quit date: 09/11/1983    Goals Met:  Independence with exercise equipment Exercise tolerated well No report of cardiac concerns or symptoms Strength training completed today  Goals Unmet:  Not Applicable  Comments: Pt able to follow exercise prescription today without complaint.  Will continue to monitor for progression.    Dr. Emily Filbert is Medical Director for [place].  Dr. Ottie Glazier is Medical Director for [facility] Pulmonary Rehabilitation.

## 2020-11-17 ENCOUNTER — Encounter: Payer: Medicare Other | Admitting: *Deleted

## 2020-11-17 ENCOUNTER — Other Ambulatory Visit: Payer: Self-pay

## 2020-11-17 DIAGNOSIS — I213 ST elevation (STEMI) myocardial infarction of unspecified site: Secondary | ICD-10-CM

## 2020-11-17 DIAGNOSIS — Z955 Presence of coronary angioplasty implant and graft: Secondary | ICD-10-CM

## 2020-11-17 NOTE — Progress Notes (Signed)
Daily Session Note  Patient Details  Name: Terris Germano MRN: [id number] Date of Birth: May 01, 1941 Referring Provider:   Flowsheet Row Cardiac Rehab from 09/16/2020 in [facility] Cardiac and Pulmonary Rehab  Referring Provider [name] MD      Encounter Date: 11/17/2020  Check In:  Session Check In - 11/17/20 1111      Check-In   Supervising physician immediately available to respond to emergencies See telemetry face sheet for immediately available ER MD    Location [facility]-Cardiac & Pulmonary Rehab    Staff Present Heath Lark, RN, BSN, Jacklynn Bue, MS, ASCM CEP, Exercise Physiologist;Melissa Caiola RDN, LDN    Virtual Visit No    Medication changes reported     No    Fall or balance concerns reported    No    Warm-up and Cool-down Performed on first and last piece of equipment    Resistance Training Performed Yes    VAD Patient? No    PAD/SET Patient? No      Pain Assessment   Currently in Pain? No/denies              Social History   Tobacco Use  Smoking Status Former Smoker  . Packs/day: 1.00  . Years: 20.00  . Pack years: 20.00  . Types: Cigarettes  . Quit date: 09/11/1983  . Years since quitting: 37.2  Smokeless Tobacco Former Systems developer  . Types: Chew  . Quit date: 09/11/1983    Goals Met:  Independence with exercise equipment Exercise tolerated well No report of cardiac concerns or symptoms  Goals Unmet:  Not Applicable  Comments: Pt able to follow exercise prescription today without complaint.  Will continue to monitor for progression.    Dr. Emily Filbert is Medical Director for [place].  Dr. Ottie Glazier is Medical Director for [facility] Pulmonary Rehabilitation.

## 2020-11-24 ENCOUNTER — Other Ambulatory Visit: Payer: Self-pay

## 2020-11-24 ENCOUNTER — Encounter: Payer: Medicare Other | Attending: Cardiology

## 2020-11-24 DIAGNOSIS — Z955 Presence of coronary angioplasty implant and graft: Secondary | ICD-10-CM

## 2020-11-24 DIAGNOSIS — I213 ST elevation (STEMI) myocardial infarction of unspecified site: Secondary | ICD-10-CM | POA: Insufficient documentation

## 2020-11-24 NOTE — Progress Notes (Signed)
Daily Session Note  Patient Details  Name: Carlos Mathis MRN: [id number] Date of Birth: [birth year] Referring Provider:   Flowsheet Row Cardiac Rehab from 09/16/2020 in [facility] Cardiac and Pulmonary Rehab  Referring Provider [name] MD      Encounter Date: 11/24/2020  Check In:  Session Check In - 11/24/20 1114      Check-In   Supervising physician immediately available to respond to emergencies See telemetry face sheet for immediately available ER MD    Location [facility]-Cardiac & Pulmonary Rehab    Staff Present Birdie Sons, MPA, RN;Melissa Caiola RDN, Rowe Pavy, BA, ACSM CEP, Exercise Physiologist;Joseph Tessie Fass RCP,RRT,BSRT    Virtual Visit No    Medication changes reported     No    Fall or balance concerns reported    No    Warm-up and Cool-down Performed on first and last piece of equipment    Resistance Training Performed Yes    VAD Patient? No    PAD/SET Patient? No      Pain Assessment   Currently in Pain? No/denies              Social History   Tobacco Use  Smoking Status Former Smoker  . Packs/day: 1.00  . Years: 20.00  . Pack years: 20.00  . Types: Cigarettes  . Quit date: 09/11/1983  . Years since quitting: 37.2  Smokeless Tobacco Former Systems developer  . Types: Chew  . Quit date: 09/11/1983    Goals Met:  Independence with exercise equipment Exercise tolerated well No report of cardiac concerns or symptoms Strength training completed today  Goals Unmet:  Not Applicable  Comments: Pt able to follow exercise prescription today without complaint.  Will continue to monitor for progression.    Dr. Emily Filbert is Medical Director for [place].  Dr. Ottie Glazier is Medical Director for [facility] Pulmonary Rehabilitation.

## 2020-11-26 ENCOUNTER — Other Ambulatory Visit: Payer: Self-pay

## 2020-11-26 DIAGNOSIS — Z955 Presence of coronary angioplasty implant and graft: Secondary | ICD-10-CM

## 2020-11-26 DIAGNOSIS — I213 ST elevation (STEMI) myocardial infarction of unspecified site: Secondary | ICD-10-CM

## 2020-11-26 NOTE — Progress Notes (Signed)
Daily Session Note  Patient Details  Name: Carlos Mathis MRN: [id number] Date of Birth: May 12, 1941 Referring Provider:   Flowsheet Row Cardiac Rehab from 09/16/2020 in [facility] Cardiac and Pulmonary Rehab  Referring Provider [name] MD       Encounter Date: 11/26/2020  Check In:  Session Check In - 11/26/20 1034       Check-In   Supervising physician immediately available to respond to emergencies See telemetry face sheet for immediately available ER MD    Location [facility]-Cardiac & Pulmonary Rehab    Staff Present Birdie Sons, MPA, RN;Melissa Caiola RDN, Rowe Pavy, BA, ACSM CEP, Exercise Physiologist    Virtual Visit No    Medication changes reported     No    Fall or balance concerns reported    No    Warm-up and Cool-down Performed on first and last piece of equipment    Resistance Training Performed Yes    VAD Patient? No    PAD/SET Patient? No      Pain Assessment   Currently in Pain? No/denies                Social History   Tobacco Use  Smoking Status Former   Packs/day: 1.00   Years: 20.00   Pack years: 20.00   Types: Cigarettes   Quit date: 09/11/1983   Years since quitting: 37.2  Smokeless Tobacco Former   Types: Chew   Quit date: 09/11/1983    Goals Met:  Independence with exercise equipment Exercise tolerated well No report of cardiac concerns or symptoms Strength training completed today  Goals Unmet:  Not Applicable  Comments: Pt able to follow exercise prescription today without complaint.  Will continue to monitor for progression.    Dr. Emily Filbert is Medical Director for [place].  Dr. Ottie Glazier is Medical Director for [facility] Pulmonary Rehabilitation.

## 2020-12-01 ENCOUNTER — Other Ambulatory Visit: Payer: Self-pay

## 2020-12-01 DIAGNOSIS — Z955 Presence of coronary angioplasty implant and graft: Secondary | ICD-10-CM

## 2020-12-01 DIAGNOSIS — I213 ST elevation (STEMI) myocardial infarction of unspecified site: Secondary | ICD-10-CM

## 2020-12-01 NOTE — Progress Notes (Signed)
Daily Session Note  Patient Details  Name: Dimitrious Micciche MRN: [id number] Date of Birth: Feb 01, 1941 Referring Provider:   Flowsheet Row Cardiac Rehab from 09/16/2020 in [facility]  Referring Provider [name] MD       Encounter Date: 12/01/2020  Check In:  Session Check In - 12/01/20 1117       Check-In   Supervising physician immediately available to respond to emergencies See telemetry face sheet for immediately available ER MD    Location [facility]    Staff Present Birdie Sons, MPA, RN;Melissa Caiola RDN, LDN;Laureen Owens Shark, BS, RRT, CPFT;Joseph [place] [employer]    Virtual Visit No    Medication changes reported     No    Fall or balance concerns reported    No    Warm-up and Cool-down Performed on first and last piece of equipment    Resistance Training Performed Yes    VAD Patient? No    PAD/SET Patient? No      Pain Assessment   Currently in Pain? No/denies                Social History   Tobacco Use  Smoking Status Former   Packs/day: 1.00   Years: 20.00   Pack years: 20.00   Types: Cigarettes   Quit date: 09/11/1983   Years since quitting: 37.2  Smokeless Tobacco Former   Types: Chew   Quit date: 09/11/1983    Goals Met:  Independence with exercise equipment Exercise tolerated well No report of cardiac concerns or symptoms Strength training completed today  Goals Unmet:  Not Applicable  Comments: Pt able to follow exercise prescription today without complaint.  Will continue to monitor for progression.    Dr. Emily Filbert is Medical Director for [place].  Dr. Ottie Glazier is Medical Director for [facility].

## 2020-12-02 ENCOUNTER — Encounter: Payer: Self-pay | Admitting: *Deleted

## 2020-12-02 DIAGNOSIS — I213 ST elevation (STEMI) myocardial infarction of unspecified site: Secondary | ICD-10-CM

## 2020-12-02 DIAGNOSIS — Z955 Presence of coronary angioplasty implant and graft: Secondary | ICD-10-CM

## 2020-12-02 NOTE — Progress Notes (Signed)
Cardiac Individual Treatment Plan  Patient Details  Name: Carlos Mathis MRN: [id number] Date of Birth: 1941/03/12 Referring Provider:   Flowsheet Row Cardiac Rehab from 09/16/2020 in [facility] Cardiac and Pulmonary Rehab  Referring Provider Carlos Cowman MD       Initial Encounter Date:  Flowsheet Row Cardiac Rehab from 09/16/2020 in [facility] Cardiac and Pulmonary Rehab  Date 09/16/20       Visit Diagnosis: Status post coronary artery stent placement  ST elevation myocardial infarction (STEMI), unspecified artery ([place])  Patient's Home Medications on Admission:  Current Outpatient Medications:    aspirin 81 MG EC tablet, Take by mouth., Disp: , Rfl:    atorvastatin (LIPITOR) 40 MG tablet, Take 1 tablet by mouth 2 (two) times daily., Disp: , Rfl:    glipiZIDE (GLUCOTROL) 10 MG tablet, Take 1 tablet by mouth 2 (two) times daily., Disp: , Rfl:    glucose blood ([employer] BLOOD GLUCOSE TEST) test strip, USE 1 STRIP FOR TESTING AS DIRECTED EVERY DAY TO TEST BLOOD SUGAR, Disp: , Rfl:    glucose blood test strip, USE 1 STRIP FOR TESTING AS DIRECTED EVERY DAY TO TEST BLOOD SUGAR, Disp: , Rfl:    losartan (COZAAR) 25 MG tablet, Take 1 tablet by mouth daily., Disp: , Rfl:    metFORMIN (GLUMETZA) 500 MG (MOD) 24 hr tablet, Take 1 tablet by mouth daily., Disp: , Rfl:    metoprolol succinate (TOPROL-XL) 100 MG 24 hr tablet, 25 mg 1 day or 1 dose., Disp: , Rfl:    Multiple Vitamin (MULTIVITAMIN) capsule, Take 1 capsule by mouth daily., Disp: , Rfl:    nitroGLYCERIN (NITROSTAT) 0.4 MG SL tablet, Place under the tongue., Disp: , Rfl:    Omega-3 Fatty Acids (OMEGA-3 FISH OIL) 1200 MG CAPS, Take 1 tablet by mouth daily., Disp: , Rfl:    ranolazine (RANEXA) 500 MG 12 hr tablet, Take 1 tablet by mouth 2 (two) times daily., Disp: , Rfl:    Red Yeast Rice Extract (RED YEAST RICE PO), Take 1 tablet by mouth 2 (two) times daily., Disp: , Rfl:    ticagrelor (BRILINTA) 90 MG TABS tablet, Take 1 tablet by mouth  2 (two) times daily., Disp: , Rfl:    Valerian Root 450 MG CAPS, Take by mouth., Disp: , Rfl:   Past Medical History: No past medical history on file.  Tobacco Use: Social History   Tobacco Use  Smoking Status Former   Packs/day: 1.00   Years: 20.00   Pack years: 20.00   Types: Cigarettes   Quit date: 09/11/1983   Years since quitting: 37.2  Smokeless Tobacco Former   Types: Chew   Quit date: 09/11/1983    Labs: Recent Review Flowsheet Data   There is no flowsheet data to display.      Exercise Target Goals: Exercise Program Goal: Individual exercise prescription set using results from initial 6 min walk test and THRR while considering  patient's activity barriers and safety.   Exercise Prescription Goal: Initial exercise prescription builds to 30-45 minutes a day of aerobic activity, 2-3 days per week.  Home exercise guidelines will be given to patient during program as part of exercise prescription that the participant will acknowledge.   Education: Aerobic Exercise: - Group verbal and visual presentation on the components of exercise prescription. Introduces F.I.T.T principle from ACSM for exercise prescriptions.  Reviews F.I.T.T. principles of aerobic exercise including progression. Written material given at graduation. Flowsheet Row Cardiac Rehab from 11/26/2020 in [facility] Cardiac and Pulmonary  Rehab  Education need identified 09/16/20  Date 10/08/20  Educator [place]  Instruction Review Code 1- Verbalizes Understanding       Education: Resistance Exercise: - Group verbal and visual presentation on the components of exercise prescription. Introduces F.I.T.T principle from ACSM for exercise prescriptions  Reviews F.I.T.T. principles of resistance exercise including progression. Written material given at graduation. Flowsheet Row Cardiac Rehab from 11/26/2020 in [facility] Cardiac and Pulmonary Rehab  Date 10/15/20  Educator [facility]  Instruction Review Code 1- Verbalizes Understanding         Education: Exercise & Equipment Safety: - Individual verbal instruction and demonstration of equipment use and safety with use of the equipment. Flowsheet Row Cardiac Rehab from 11/26/2020 in [facility] Cardiac and Pulmonary Rehab  Date 09/10/20  Educator [facility]  Instruction Review Code 1- Verbalizes Understanding       Education: Exercise Physiology & General Exercise Guidelines: - Group verbal and written instruction with models to review the exercise physiology of the cardiovascular system and associated critical values. Provides general exercise guidelines with specific guidelines to those with heart or lung disease.    Education: Flexibility, Balance, Mind/Body Relaxation: - Group verbal and visual presentation with interactive activity on the components of exercise prescription. Introduces F.I.T.T principle from ACSM for exercise prescriptions. Reviews F.I.T.T. principles of flexibility and balance exercise training including progression. Also discusses the mind body connection.  Reviews various relaxation techniques to help reduce and manage stress (i.e. Deep breathing, progressive muscle relaxation, and visualization). Balance handout provided to take home. Written material given at graduation.   Activity Barriers & Risk Stratification:  Activity Barriers & Cardiac Risk Stratification - 09/16/20 1520       Activity Barriers & Cardiac Risk Stratification   Activity Barriers Right Knee Replacement;Arthritis;Joint Problems;Balance Concerns;History of Falls;Muscular Weakness;Shortness of Breath;Deconditioning;Back Problems   arth in back, bilateral knee pain   Cardiac Risk Stratification Moderate             6 Minute Walk:  6 Minute Walk     Row Name 09/16/20 1514         6 Minute Walk   Phase Initial     Distance 1165 feet     Walk Time 6 minutes     # of Rest Breaks 0     MPH 2.21     METS 2.45     RPE 13     VO2 Peak 8.57     Symptoms Yes (comment)     Comments  shuffling feet, felt stumbly at end     Resting HR 80 bpm     Resting BP 154/62     Resting Oxygen Saturation  99 %     Exercise Oxygen Saturation  during 6 min walk 99 %     Max Ex. HR 118 bpm     Max Ex. BP 148/74     2 Minute Post BP 112/62              Oxygen Initial Assessment:   Oxygen Re-Evaluation:   Oxygen Discharge (Final Oxygen Re-Evaluation):   Initial Exercise Prescription:  Initial Exercise Prescription - 09/16/20 1500       Date of Initial Exercise RX and Referring Provider   Date 09/16/20    Referring Provider Carlos Cowman MD      Treadmill   MPH 1.9    Grade 0.5    Minutes 15    METs 2.59      REL-XR   Level 1  Speed 50    Minutes 15    METs 2.5      T5 Nustep   Level 1    SPM 80    Minutes 15    METs 2.5      Prescription Details   Frequency (times per week) 2    Duration Progress to 30 minutes of continuous aerobic without signs/symptoms of physical distress      Intensity   THRR 40-80% of Max Heartrate 104-129    Ratings of Perceived Exertion 11-13    Perceived Dyspnea 0-4      Progression   Progression Continue to progress workloads to maintain intensity without signs/symptoms of physical distress.      Resistance Training   Training Prescription Yes    Weight 3 lb    Reps 10-15             Perform Capillary Blood Glucose checks as needed.  Exercise Prescription Changes:   Exercise Prescription Changes     Row Name 09/16/20 1500 09/21/20 0900 10/07/20 1300 10/20/20 1300 11/03/20 1500     Response to Exercise   Blood Pressure (Admit) 154/62 124/74 110/60 104/60 106/60   Blood Pressure (Exercise) 148/74 116/64 118/62 136/74 122/60   Blood Pressure (Exit) 112/62 108/64 98/60 124/74 112/62   Heart Rate (Admit) 80 bpm 73 bpm 80 bpm 69 bpm 85 bpm   Heart Rate (Exercise) 118 bpm 110 bpm 120 bpm 84 bpm 102 bpm   Heart Rate (Exit) 90 bpm 75 bpm 88 bpm 78 bpm 82 bpm   Oxygen Saturation (Admit) 99 % -- -- --  96 %   Oxygen Saturation (Exercise) 99 % -- -- -- 95 %   Oxygen Saturation (Exit) -- -- -- -- 96 %   Rating of Perceived Exertion (Exercise) 13 15 12 12 13    Perceived Dyspnea (Exercise) -- -- -- -- 0   Symptoms shuffling feet, felt stumbly at end -- -- -- none   Comments walk test results first day -- -- --   Duration -- -- Continue with 30 min of aerobic exercise without signs/symptoms of physical distress. Continue with 30 min of aerobic exercise without signs/symptoms of physical distress. Continue with 30 min of aerobic exercise without signs/symptoms of physical distress.   Intensity -- -- THRR unchanged THRR unchanged THRR unchanged     Progression   Progression -- Continue to progress workloads to maintain intensity without signs/symptoms of physical distress. Continue to progress workloads to maintain intensity without signs/symptoms of physical distress. Continue to progress workloads to maintain intensity without signs/symptoms of physical distress. Continue to progress workloads to maintain intensity without signs/symptoms of physical distress.   Average METs -- 2.59 2.75 2.6 3.4     Resistance Training   Training Prescription -- Yes Yes Yes Yes   Weight -- 3 lb 4 lb 4 lb 4 lb   Reps -- 10-15 10-15 10-15 10-15     Interval Training   Interval Training -- -- -- -- No     Treadmill   MPH -- 1.9 1.9 -- --   Grade -- 0.5 0.5 -- --   Minutes -- 15 15 -- --   METs -- 2.59 2.59 -- --     Recumbant Bike   Level -- -- 1 3 2    Minutes -- -- 15 15 15    METs -- -- 2.9 2.93 3     NuStep   Level -- -- -- 5 6   Minutes -- -- --  15 15   METs -- -- -- 2.3 3.8     T5 Nustep   Level -- 1 -- -- --   SPM -- 80 -- -- --   Minutes -- 15 -- -- --    Row Name 11/18/20 1500             Response to Exercise   Blood Pressure (Admit) 108/58       Blood Pressure (Exercise) 136/64       Blood Pressure (Exit) 122/64       Heart Rate (Admit) 81 bpm       Heart Rate (Exercise) 100 bpm        Heart Rate (Exit) 78 bpm       Oxygen Saturation (Admit) 96 %       Oxygen Saturation (Exercise) 95 %       Oxygen Saturation (Exit) 95 %       Rating of Perceived Exertion (Exercise) 13       Symptoms none       Duration Continue with 30 min of aerobic exercise without signs/symptoms of physical distress.       Intensity THRR unchanged               Progression     Progression Continue to progress workloads to maintain intensity without signs/symptoms of physical distress.       Average METs 2.8               Resistance Training     Training Prescription Yes       Weight 4 lb       Reps 10-15               Interval Training     Interval Training No               Treadmill     MPH 2       Grade 0.5       Minutes 15       METs 2.67               Recumbant Bike     Level 8       Minutes 15       METs 2.94               Exercise Comments:   Exercise Goals and Review:   Exercise Goals     [place] Name 09/16/20 1522             Exercise Goals   Increase Physical Activity Yes       Intervention Provide advice, education, support and counseling about physical activity/exercise needs.;Develop an individualized exercise prescription for aerobic and resistive training based on initial evaluation findings, risk stratification, comorbidities and participant's personal goals.       Expected Outcomes Short Term: Attend rehab on a regular basis to increase amount of physical activity.;Long Term: Add in home exercise to make exercise part of routine and to increase amount of physical activity.;Long Term: Exercising regularly at least 3-5 days a week.       Increase Strength and Stamina Yes       Intervention Provide advice, education, support and counseling about physical activity/exercise needs.;Develop an individualized exercise prescription for aerobic and resistive training based on initial evaluation findings, risk stratification, comorbidities and participant's  personal goals.       Expected Outcomes Short Term: Increase workloads from initial exercise prescription for resistance, speed,  and METs.;Short Term: Perform resistance training exercises routinely during rehab and add in resistance training at home;Long Term: Improve cardiorespiratory fitness, muscular endurance and strength as measured by increased METs and functional capacity (6MWT)       Able to understand and use rate of perceived exertion (RPE) scale Yes       Intervention Provide education and explanation on how to use RPE scale       Expected Outcomes Short Term: Able to use RPE daily in rehab to express subjective intensity level;Long Term:  Able to use RPE to guide intensity level when exercising independently       Able to understand and use Dyspnea scale Yes       Intervention Provide education and explanation on how to use Dyspnea scale       Expected Outcomes Short Term: Able to use Dyspnea scale daily in rehab to express subjective sense of shortness of breath during exertion;Long Term: Able to use Dyspnea scale to guide intensity level when exercising independently       Knowledge and understanding of Target Heart Rate Range (THRR) Yes       Intervention Provide education and explanation of THRR including how the numbers were predicted and where they are located for reference       Expected Outcomes Short Term: Able to state/look up THRR;Long Term: Able to use THRR to govern intensity when exercising independently;Short Term: Able to use daily as guideline for intensity in rehab       Able to check pulse independently Yes       Intervention Provide education and demonstration on how to check pulse in carotid and radial arteries.;Review the importance of being able to check your own pulse for safety during independent exercise       Expected Outcomes Short Term: Able to explain why pulse checking is important during independent exercise;Long Term: Able to check pulse independently and  accurately       Understanding of Exercise Prescription Yes       Intervention Provide education, explanation, and written materials on patient's individual exercise prescription       Expected Outcomes Short Term: Able to explain program exercise prescription;Long Term: Able to explain home exercise prescription to exercise independently                Exercise Goals Re-Evaluation :  Exercise Goals Re-Evaluation     Row Name 09/17/20 1039 10/07/20 1332 10/20/20 1310 11/03/20 1539 11/17/20 1116     Exercise Goal Re-Evaluation   Exercise Goals Review Increase Physical Activity;Able to understand and use rate of perceived exertion (RPE) scale;Knowledge and understanding of Target Heart Rate Range (THRR);Understanding of Exercise Prescription;Increase Strength and Stamina;Able to check pulse independently Increase Physical Activity;Increase Strength and Stamina Increase Physical Activity;Increase Strength and Stamina Increase Physical Activity;Increase Strength and Stamina;Understanding of Exercise Prescription Increase Physical Activity;Increase Strength and Stamina;Understanding of Exercise Prescription   Comments Reviewed RPE and dyspnea scales, THR and program prescription with pt today.  Pt voiced understanding and was given a copy of goals to take home. Carlos Mathis is tolerating exercise well and reaches his THR range.  He has worked up to prescribed speed on TM.  We will continue to monitor progress. Carlos Mathis attends consistently.  Staff will encourage trying 5 lb for strength work. Carlos Mathis is doing well in rehab.  He is now on level 6 for the NuStep.  We will continue to montior his progress. Carlos Mathis is attending rehab consistently. He reports  it is hard to exercise at home as his equipment is upstairs. Discussed other options such as walking outside when it is nice and going early in the am before it gets too hot.   Expected Outcomes Short: Use RPE daily to regulate intensity. Long: Follow program prescription  in THR. Short:  continue to follow program prescription Long: improve overall stamina Short: maintain consistent attendance Long:  increase MET level Short: Try to increase handweights Long: Continue to improve stamina Short: Walk at home outside - in am when very hot. Long: Continue to improve stamina            Discharge Exercise Prescription (Final Exercise Prescription Changes):  Exercise Prescription Changes - 11/18/20 1500       Response to Exercise   Blood Pressure (Admit) 108/58    Blood Pressure (Exercise) 136/64    Blood Pressure (Exit) 122/64    Heart Rate (Admit) 81 bpm    Heart Rate (Exercise) 100 bpm    Heart Rate (Exit) 78 bpm    Oxygen Saturation (Admit) 96 %    Oxygen Saturation (Exercise) 95 %    Oxygen Saturation (Exit) 95 %    Rating of Perceived Exertion (Exercise) 13    Symptoms none    Duration Continue with 30 min of aerobic exercise without signs/symptoms of physical distress.    Intensity THRR unchanged      Progression   Progression Continue to progress workloads to maintain intensity without signs/symptoms of physical distress.    Average METs 2.8      Resistance Training   Training Prescription Yes    Weight 4 lb    Reps 10-15      Interval Training   Interval Training No      Treadmill   MPH 2    Grade 0.5    Minutes 15    METs 2.67      Recumbant Bike   Level 8    Minutes 15    METs 2.94             Nutrition:  Target Goals: Understanding of nutrition guidelines, daily intake of sodium <1573m, cholesterol <2068m calories 30% from fat and 7% or less from saturated fats, daily to have 5 or more servings of fruits and vegetables.  Education: All About Nutrition: -Group instruction provided by verbal, written material, interactive activities, discussions, models, and posters to present general guidelines for heart healthy nutrition including fat, fiber, MyPlate, the role of sodium in heart healthy nutrition, utilization of the  nutrition label, and utilization of this knowledge for meal planning. Follow up email sent as well. Written material given at graduation. [facility]  Education need identified 09/16/20  Date 10/29/20  [place] 1- Verbalizes Understanding       Biometrics:  Pre Biometrics - 09/16/20 1523       Pre Biometrics   Height 5' 8.7" (1.745 m)    Weight 192 lb (87.1 kg)    BMI (Calculated) 28.6    Single Leg Stand 6 seconds              Nutrition Therapy Plan and Nutrition Goals:  Nutrition Therapy & Goals - 09/29/20 1031       Nutrition Therapy   Diet Heart healthy, low Na, diabetes friendly    Drug/Food Interactions Statins/Certain Fruits    Protein (specify units) 70g    Fiber 30 grams  Whole Grain Foods 3 servings    Saturated Fats 12 max. grams    Fruits and Vegetables 8 servings/day    Sodium 1.5 grams      Personal Nutrition Goals   Nutrition Goal ST: try banana "nice cream" LT: continue with heart healthy changes    Comments 7.7 A1C. 7-8am BG: 115-140 (140 due to eating out). Eats out every once in a while. Uses monkfruit and stevia. Wife hardly ever puts salt in. Breakfast (7-2ZD): bacon 1 slice every other day, 1 egg - mushrooms, low fat and low Na cheese. (coffee - creamer (no sugar)). S: Tangerine or banana Lunch (12-12:30pm): has to have lunch at this time due to downward spiral. Low Na tuna for tuna salad with some mayo and relish with solo bread (3g net carbs) two gerkins pickles. Tortilla chips (low Na, low saturated fat) with salsa. S: peanut butter and apple or cheese or carrots/celery/broccoli or cheetos if he is having a bad day. Dinner: meat (pork loin, hamburger 97% lean, chicken - dark meat, and fish) and vegetables (broccoli, asparagus, cabbage, greens like collards, turnip roots, he does not like cook tomatoes, some potatoes, green beans, spaghetti squash, makes  her own tomato sauce, zucchini tots, onions, cook beans every week. Limits red meat, uses olive oil and avocado oil, uses lots of herbs and mrs dash, they like salmon and tilapia. They also have jello and whipped cream (small portion) with dinner. Discussed heart healthy and diabetes friendly eating.      Intervention Plan   Intervention Prescribe, educate and counsel regarding individualized specific dietary modifications aiming towards targeted core components such as weight, hypertension, lipid management, diabetes, heart failure and other comorbidities.;Nutrition handout(s) given to patient.    Expected Outcomes Short Term Goal: Understand basic principles of dietary content, such as calories, fat, sodium, cholesterol and nutrients.;Short Term Goal: A plan has been developed with personal nutrition goals set during dietitian appointment.;Long Term Goal: Adherence to prescribed nutrition plan.             Nutrition Assessments:  MEDIFICTS Score Key: ?70 Need to make dietary changes  40-70 Heart Healthy Diet ? 40 Therapeutic Level Cholesterol Diet  Flowsheet Row Cardiac Rehab from 09/16/2020 in [facility] Cardiac and Pulmonary Rehab  Picture Your Plate Total Score on Admission 80      Picture Your Plate Scores: <66 Unhealthy dietary pattern with much room for improvement. 41-50 Dietary pattern unlikely to meet recommendations for good health and room for improvement. 51-60 More healthful dietary pattern, with some room for improvement.  >60 Healthy dietary pattern, although there may be some specific behaviors that could be improved.    Nutrition Goals Re-Evaluation:  Nutrition Goals Re-Evaluation     [place] Name 10/27/20 1109 11/17/20 1110           Goals   Current Weight 186 lb (84.4 kg) --      Nutrition Goal Eat lower sodium. Stay away from unhealthy foods. ST:LT: continue with healthy eating wife has him doing      Comment Carlos Mathis wife has been maintaining his diet and he feels  like he eats well. His diabetes is under control and has no issues with his sugar. He states he is drinking enough water. Carlos Mathis report wife is "keeping him in line" with his nutrition. He reports no changes and reports wanting to continuing what he is doing.      Expected Outcome Short: continue heart healthy diet. Long: maintain his sugars and diet  independently. ST:LT: continue with healthy eating wife has him doing               Nutrition Goals Discharge (Final Nutrition Goals Re-Evaluation):  Nutrition Goals Re-Evaluation - 11/17/20 1110       Goals   Nutrition Goal ST:LT: continue with healthy eating wife has him doing    Comment Carlos Mathis report wife is "keeping him in line" with his nutrition. He reports no changes and reports wanting to continuing what he is doing.    Expected Outcome ST:LT: continue with healthy eating wife has him doing             Psychosocial: Target Goals: Acknowledge presence or absence of significant depression and/or stress, maximize coping skills, provide positive support system. Participant is able to verbalize types and ability to use techniques and skills needed for reducing stress and depression.   Education: Stress, Anxiety, and Depression - Group verbal and visual presentation to define topics covered.  Reviews how body is impacted by stress, anxiety, and depression.  Also discusses healthy ways to reduce stress and to treat/manage anxiety and depression.  Written material given at graduation. Flowsheet Row Cardiac Rehab from 11/26/2020 in [facility] Cardiac and Pulmonary Rehab  Date 11/26/20  Educator [facility]  Instruction Review Code 1- [employer] Understanding       Education: Sleep Hygiene -Provides group verbal and written instruction about how sleep can affect your health.  Define sleep hygiene, discuss sleep cycles and impact of sleep habits. Review good sleep hygiene tips.    Initial Review & Psychosocial Screening:  Initial Psych Review & Screening  - 09/10/20 1011       Initial Review   Current issues with Current Stress Concerns    Comments The just moved from [place] and are in a house that needs a bit of work and is stressing him out.      Family Dynamics   Good Support System? Yes    Comments He has his wfe and God for support. They wanted to move back to his home state and his wife enjoyed it here .He has a positive outlook on his health.      Barriers   Psychosocial barriers to participate in program The patient should benefit from training in stress management and relaxation.      Screening Interventions   Interventions To provide support and resources with identified psychosocial needs;Provide feedback about the scores to participant;Encouraged to exercise    Expected Outcomes Short Term goal: Utilizing psychosocial counselor, staff and physician to assist with identification of specific Stressors or current issues interfering with healing process. Setting desired goal for each stressor or current issue identified.;Long Term Goal: Stressors or current issues are controlled or eliminated.;Short Term goal: Identification and review with participant of any Quality of Life or Depression concerns found by scoring the questionnaire.;Long Term goal: The participant improves quality of Life and PHQ9 Scores as seen by post scores and/or verbalization of changes             Quality of Life Scores:   Quality of Life - 09/16/20 1523       Quality of Life   Select Quality of Life      Quality of Life Scores   Health/Function Pre 24.33 %    Socioeconomic Pre 23.75 %    Psych/Spiritual Pre 29.64 %    Family Pre 26.4 %    GLOBAL Pre 25.56 %  Scores of 19 and below usually indicate a poorer quality of life in these areas.  A difference of  2-3 points is a clinically meaningful difference.  A difference of 2-3 points in the total score of the Quality of Life Index has been associated with significant improvement in  overall quality of life, self-image, physical symptoms, and general health in studies assessing change in quality of life.  PHQ-9: Recent Review Flowsheet Data     Depression screen [facility] 2/9 10/27/2020 09/16/2020   Decreased Interest 0 1   Down, Depressed, Hopeless 0 1   PHQ - 2 Score 0 2   Altered sleeping 0 0   Tired, decreased energy 0 1   Change in appetite 1 1   Feeling bad or failure about yourself  0 0   Trouble concentrating 3 1   Moving slowly or fidgety/restless 0 1   Suicidal thoughts 0 0   PHQ-9 Score 4 6   Difficult doing work/chores Not difficult at all Not difficult at all      Interpretation of Total Score  Total Score Depression Severity:  1-4 = Minimal depression, 5-9 = Mild depression, 10-14 = Moderate depression, 15-19 = Moderately severe depression, 20-27 = Severe depression   Psychosocial Evaluation and Intervention:  Psychosocial Evaluation - 09/10/20 1016       Psychosocial Evaluation & Interventions   Interventions Encouraged to exercise with the program and follow exercise prescription;Relaxation education;Stress management education    Comments The just moved from [place] and are in a house that needs a bit of work and is stressing him out. He has his wfe and God for support. They wanted to move back to his home state and his wife enjoyed it here .He has a positive outlook on his health.    Expected Outcomes Short: Exercise regularly to support mental health and notify staff of any changes. Long: maintain mental health and well being through teaching of rehab or prescribed medications independently.    Continue Psychosocial Services  Follow up required by staff             Psychosocial Re-Evaluation:  Psychosocial Re-Evaluation     [place] Name 10/27/20 1116 11/17/20 1119           Psychosocial Re-Evaluation   Current issues with None Identified Current Stress Concerns      Comments Reviewed patient health questionnaire (PHQ-9) with patient for  follow up. Previously, patients score indicated signs/symptoms of depression.  Reviewed to see if patient is improving symptom wise while in program.  Score improved and patient states that it is because he has been able to have more energy and exercise.Marland Kitchen His foundation of his home is messed up and they are getting it fixed which was over $3000. He reports otherwise doing well aside from some back pain - he went to [place] for back pain - they think it is muscular. He likes to ride in his car to help relax. He has a good support system in his wife as well as his sister, but she is going through stress with her husbands health.      Expected Outcomes Short: Continue to attend [facility] regularly for regular exercise and social engagement. Long: Continue to improve symptoms and manage a positive mental state. Short: Continue to attend [facility] regularly for regular exercise and social engagement, follow up with doctor about back. Long: Continue to improve symptoms and manage a positive mental state.      Interventions Encouraged to  attend Cardiac Rehabilitation for the exercise Encouraged to attend Cardiac Rehabilitation for the exercise      Continue Psychosocial Services  No Follow up required No Follow up required             Initial Review      Source of Stress Concerns -- Financial              Psychosocial Discharge (Final Psychosocial Re-Evaluation):  Psychosocial Re-Evaluation - 11/17/20 1119       Psychosocial Re-Evaluation   Current issues with Current Stress Concerns    Comments His foundation of his home is messed up and they are getting it fixed which was over $3000. He reports otherwise doing well aside from some back pain - he went to [place] for back pain - they think it is muscular. He likes to ride in his car to help relax. He has a good support system in his wife as well as his sister, but she is going through stress with her husbands health.    Expected Outcomes Short: Continue to  attend [facility] regularly for regular exercise and social engagement, follow up with doctor about back. Long: Continue to improve symptoms and manage a positive mental state.    Interventions Encouraged to attend Cardiac Rehabilitation for the exercise    Continue Psychosocial Services  No Follow up required      Initial Review   Source of Stress Concerns Financial             Vocational Rehabilitation: Provide vocational rehab assistance to qualifying candidates.   Vocational Rehab Evaluation & Intervention:   Education: Education Goals: Education classes will be provided on a variety of topics geared toward better understanding of heart health and risk factor modification. Participant will state understanding/return demonstration of topics presented as noted by education test scores.  Learning Barriers/Preferences:  Learning Barriers/Preferences - 09/10/20 1009       Learning Barriers/Preferences   Learning Barriers None    Learning Preferences None             General Cardiac Education Topics:  AED/CPR: - Group verbal and written instruction with the use of models to demonstrate the basic use of the AED with the basic ABC's of resuscitation.   Anatomy and Cardiac Procedures: - Group verbal and visual presentation and models provide information about basic cardiac anatomy and function. Reviews the testing methods done to diagnose heart disease and the outcomes of the test results. Describes the treatment choices: Medical Management, Angioplasty, or Coronary Bypass Surgery for treating various heart conditions including Myocardial Infarction, Angina, Valve Disease, and Cardiac Arrhythmias.  Written material given at graduation. Flowsheet Row Cardiac Rehab from 11/26/2020 in [facility] Cardiac and Pulmonary Rehab  Education need identified 09/16/20  Date 10/15/20  Educator [facility]  Instruction Review Code 1- Verbalizes Understanding       Medication Safety: - Group verbal  and visual instruction to review commonly prescribed medications for heart and lung disease. Reviews the medication, class of the drug, and side effects. Includes the steps to properly store meds and maintain the prescription regimen.  Written material given at graduation. Flowsheet Row Cardiac Rehab from 11/26/2020 in [facility] Cardiac and Pulmonary Rehab  Date 11/05/20  Educator [facility]  Instruction Review Code 1- Verbalizes Understanding       Intimacy: - Group verbal instruction through game format to discuss how heart and lung disease can affect sexual intimacy. Written material given at graduation.. Flowsheet Row Cardiac Rehab from 11/26/2020  in [facility] Cardiac and Pulmonary Rehab  Date 10/08/20  Educator [place]  Instruction Review Code 1- Verbalizes Understanding       Know Your Numbers and Heart Failure: - Group verbal and visual instruction to discuss disease risk factors for cardiac and pulmonary disease and treatment options.  Reviews associated critical values for Overweight/Obesity, Hypertension, Cholesterol, and Diabetes.  Discusses basics of heart failure: signs/symptoms and treatments.  Introduces Heart Failure Zone chart for action plan for heart failure.  Written material given at graduation. Flowsheet Row Cardiac Rehab from 11/26/2020 in [facility] Cardiac and Pulmonary Rehab  Date 11/12/20  Educator [facility]  Instruction Review Code 1- Verbalizes Understanding       Infection Prevention: - Provides verbal and written material to individual with discussion of infection control including proper hand washing and proper equipment cleaning during exercise session. Flowsheet Row Cardiac Rehab from 11/26/2020 in [facility] Cardiac and Pulmonary Rehab  Date 09/10/20  Educator [facility]  Instruction Review Code 1- Verbalizes Understanding       Falls Prevention: - Provides verbal and written material to individual with discussion of falls prevention and safety. Flowsheet Row Cardiac Rehab from 11/26/2020 in [facility]  Cardiac and Pulmonary Rehab  Date 09/10/20  Educator [facility]  Instruction Review Code 1- Verbalizes Understanding       Other: -Provides group and verbal instruction on various topics (see comments)   Knowledge Questionnaire Score:  Knowledge Questionnaire Score - 09/16/20 1524       Knowledge Questionnaire Score   Pre Score 19/26 Education Focus: Angina, nurtrition, exercise             Core Components/Risk Factors/Patient Goals at Admission:  Personal Goals and Risk Factors at Admission - 09/16/20 1524       Core Components/Risk Factors/Patient Goals on Admission    Weight Management Yes;Weight Loss    Intervention Weight Management: Develop a combined nutrition and exercise program designed to reach desired caloric intake, while maintaining appropriate intake of nutrient and fiber, sodium and fats, and appropriate energy expenditure required for the weight goal.;Weight Management: Provide education and appropriate resources to help participant work on and attain dietary goals.;Weight Management/Obesity: Establish reasonable short term and long term weight goals.    Admit Weight 192 lb (87.1 kg)    Goal Weight: Short Term 185 lb (83.9 kg)    Goal Weight: Long Term 180 lb (81.6 kg)    Expected Outcomes Short Term: Continue to assess and modify interventions until short term weight is achieved;Long Term: Adherence to nutrition and physical activity/exercise program aimed toward attainment of established weight goal;Weight Loss: Understanding of general recommendations for a balanced deficit meal plan, which promotes 1-2 lb weight loss per week and includes a negative energy balance of [phone] kcal/d;Understanding recommendations for meals to include 15-35% energy as protein, 25-35% energy from fat, 35-60% energy from carbohydrates, less than 219m of dietary cholesterol, 20-35 gm of total fiber daily;Understanding of distribution of calorie intake throughout the day with the  consumption of 4-5 meals/snacks    Diabetes Yes    Intervention Provide education about signs/symptoms and action to take for hypo/hyperglycemia.;Provide education about proper nutrition, including hydration, and aerobic/resistive exercise prescription along with prescribed medications to achieve blood glucose in normal ranges: Fasting glucose 65-99 mg/dL    Expected Outcomes Short Term: Participant verbalizes understanding of the signs/symptoms and immediate care of hyper/hypoglycemia, proper foot care and importance of medication, aerobic/resistive exercise and nutrition plan for blood glucose control.;Long Term: Attainment of HbA1C < 7%.  Hypertension Yes    Intervention Provide education on lifestyle modifcations including regular physical activity/exercise, weight management, moderate sodium restriction and increased consumption of fresh fruit, vegetables, and low fat dairy, alcohol moderation, and smoking cessation.;Monitor prescription use compliance.    Expected Outcomes Short Term: Continued assessment and intervention until BP is < 140/48m HG in hypertensive participants. < 130/831mHG in hypertensive participants with diabetes, heart failure or chronic kidney disease.;Long Term: Maintenance of blood pressure at goal levels.    Lipids Yes    Intervention Provide education and support for participant on nutrition & aerobic/resistive exercise along with prescribed medications to achieve LDL <7031mHDL >71m55m  Expected Outcomes Short Term: Participant states understanding of desired cholesterol values and is compliant with medications prescribed. Participant is following exercise prescription and nutrition guidelines.;Long Term: Cholesterol controlled with medications as prescribed, with individualized exercise RX and with personalized nutrition plan. Value goals: LDL < 70mg64mL > 40 mg.             Education:Diabetes - Individual verbal and written instruction to review signs/symptoms  of diabetes, desired ranges of glucose level fasting, after meals and with exercise. Acknowledge that pre and post exercise glucose checks will be done for 3 sessions at entry of program. [place] [facility]  Date 09/10/20  [facility] Code 1- Verbalizes Understanding       Core Components/Risk Factors/Patient Goals Review:   Goals and Risk Factor Review     Row Name 10/27/20 1107 11/17/20 1112           Core Components/Risk Factors/Patient Goals Review   Personal Goals Review Weight Management/Obesity;Hypertension;Heart Failure Weight Management/Obesity;Hypertension;Heart Failure;Diabetes      Review Carlos Mathis wClair Gullings to lose more weight. He was 186 pounds today. He was 192 pounds when he started the program. His weight goal is to be below 180 pounds. Carlos Mathis iClair Mathis to lose weight - today 183.6lbs. He was 192 lbs when he started the program. He continues to come to rehab and work on his nutrition with his wife. He reports his BP is about 120/70, today was lower at 108/58, but he was feeling fine. He continues to check his BP every morning. BG 110-115 in the am. He is still taking his medication as directed.      Expected Outcomes Short: lose more weight. Long: reach weight goal of 180 pounds. Short: continue to check vitals at home Long: reach weight goal of 180 pounds.               Core Components/Risk Factors/Patient Goals at Discharge (Final Review):   Goals and Risk Factor Review - 11/17/20 1112       Core Components/Risk Factors/Patient Goals Review   Personal Goals Review Weight Management/Obesity;Hypertension;Heart Failure;Diabetes    Review Carlos Mathis iClair Mathis to lose weight - today 183.6lbs. He was 192 lbs when he started the program. He continues to come to rehab and work on his nutrition with his wife. He reports his BP is about 120/70, today was lower at 108/58, but he was feeling fine. He continues  to check his BP every morning. BG 110-115 in the am. He is still taking his medication as directed.    Expected Outcomes Short: continue to check vitals at home Long: reach weight goal of 180 pounds.             ITP Comments:  ITP Comments  [place] Name 09/10/20 1008 09/16/20 1514 09/17/20 1039 10/07/20 0901 11/04/20 [id number]   ITP Comments Virtual Visit completed. Patient informed on EP and RD appointment and 6 Minute walk test. Patient also informed of patient health questionnaires on My Chart. Patient Verbalizes understanding. Visit diagnosis can be found in [facility] 09/02/20. Completed 6MWT and gym orientation. Initial ITP created and sent for review to Dr. Emily Filbert, Medical Director. First full day of exercise!  Patient was oriented to gym and equipment including functions, settings, policies, and procedures.  Patient's individual exercise prescription and treatment plan were reviewed.  All starting workloads were established based on the results of the 6 minute walk test done at initial orientation visit.  The plan for exercise progression was also introduced and progression will be customized based on patient's performance and goals. 30 Day review completed. Medical Director ITP review done, changes made as directed, and signed approval by Medical Director.  New to program 30 Day review completed. Medical Director ITP review done, changes made as directed, and signed approval by Medical Director.   2 visits in May    Row Name 12/02/20 0641           ITP Comments 30 Day review completed. Medical Director ITP review done, changes made as directed, and signed approval by Medical Director.                Comments:

## 2020-12-03 ENCOUNTER — Encounter: Payer: Medicare Other | Admitting: *Deleted

## 2020-12-03 ENCOUNTER — Other Ambulatory Visit: Payer: Self-pay

## 2020-12-03 DIAGNOSIS — I213 ST elevation (STEMI) myocardial infarction of unspecified site: Secondary | ICD-10-CM

## 2020-12-03 DIAGNOSIS — Z955 Presence of coronary angioplasty implant and graft: Secondary | ICD-10-CM

## 2020-12-03 NOTE — Progress Notes (Signed)
Daily Session Note  Patient Details  Name: Carlos Mathis MRN: [id number] Date of Birth: 1941-05-08 Referring Provider:   Flowsheet Row Cardiac Rehab from 09/16/2020 in [facility] Cardiac and Pulmonary Rehab  Referring Provider [name] MD       Encounter Date: 12/03/2020  Check In:  Session Check In - 12/03/20 1154       Check-In   Supervising physician immediately available to respond to emergencies See telemetry face sheet for immediately available ER MD    Location [facility]-Cardiac & Pulmonary Rehab    Staff Present Heath Lark, RN, BSN, CCRP;Meredith Sherryll Burger, RN BSN;Joseph Hood RCP,RRT,BSRT;Melissa Milam RDN, Rowe Pavy, BA, ACSM CEP, Exercise Physiologist    Virtual Visit No    Medication changes reported     No    Fall or balance concerns reported    No    Warm-up and Cool-down Performed on first and last piece of equipment    Resistance Training Performed Yes    VAD Patient? No    PAD/SET Patient? No      Pain Assessment   Currently in Pain? No/denies                Social History   Tobacco Use  Smoking Status Former   Packs/day: 1.00   Years: 20.00   Pack years: 20.00   Types: Cigarettes   Quit date: 09/11/1983   Years since quitting: 37.2  Smokeless Tobacco Former   Types: Chew   Quit date: 09/11/1983    Goals Met:  Independence with exercise equipment Exercise tolerated well Personal goals reviewed No report of cardiac concerns or symptoms  Goals Unmet:  Not Applicable  Comments:  Pt able to follow exercise prescription today without complaint.  Will continue to monitor for progression.  Dr. Emily Filbert is Medical Director for [facility].  Dr. Ottie Glazier is Medical Director for [facility] Pulmonary Rehabilitation.

## 2020-12-03 NOTE — Progress Notes (Signed)
Cardiac Individual Treatment Plan  Patient Details  Name: Carlos Mathis MRN: [id number] Date of Birth: May 09, 1941 Referring Provider:   Flowsheet Row Cardiac Rehab from 09/16/2020 in [facility] Cardiac and Pulmonary Rehab  Referring Provider [name] MD       Initial Encounter Date:  Flowsheet Row Cardiac Rehab from 09/16/2020 in [facility] Cardiac and Pulmonary Rehab  Date 09/16/20       Visit Diagnosis: Status post coronary artery stent placement  ST elevation myocardial infarction (STEMI), unspecified artery ([place])  Patient's Home Medications on Admission:  Current Outpatient Medications:    aspirin 81 MG EC tablet, Take by mouth., Disp: , Rfl:    atorvastatin (LIPITOR) 40 MG tablet, Take 1 tablet by mouth 2 (two) times daily., Disp: , Rfl:    glipiZIDE (GLUCOTROL) 10 MG tablet, Take 1 tablet by mouth 2 (two) times daily., Disp: , Rfl:    glucose blood ([employer] BLOOD GLUCOSE TEST) test strip, USE 1 STRIP FOR TESTING AS DIRECTED EVERY DAY TO TEST BLOOD SUGAR, Disp: , Rfl:    glucose blood test strip, USE 1 STRIP FOR TESTING AS DIRECTED EVERY DAY TO TEST BLOOD SUGAR, Disp: , Rfl:    losartan (COZAAR) 25 MG tablet, Take 1 tablet by mouth daily., Disp: , Rfl:    metFORMIN (GLUMETZA) 500 MG (MOD) 24 hr tablet, Take 1 tablet by mouth daily., Disp: , Rfl:    metoprolol succinate (TOPROL-XL) 100 MG 24 hr tablet, 25 mg 1 day or 1 dose., Disp: , Rfl:    Multiple Vitamin (MULTIVITAMIN) capsule, Take 1 capsule by mouth daily., Disp: , Rfl:    nitroGLYCERIN (NITROSTAT) 0.4 MG SL tablet, Place under the tongue., Disp: , Rfl:    Omega-3 Fatty Acids (OMEGA-3 FISH OIL) 1200 MG CAPS, Take 1 tablet by mouth daily., Disp: , Rfl:    ranolazine (RANEXA) 500 MG 12 hr tablet, Take 1 tablet by mouth 2 (two) times daily., Disp: , Rfl:    Red Yeast Rice Extract (RED YEAST RICE PO), Take 1 tablet by mouth 2 (two) times daily., Disp: , Rfl:    ticagrelor (BRILINTA) 90 MG TABS tablet, Take 1 tablet by mouth  2 (two) times daily., Disp: , Rfl:    Valerian Root 450 MG CAPS, Take by mouth., Disp: , Rfl:   Past Medical History: No past medical history on file.  Tobacco Use: Social History   Tobacco Use  Smoking Status Former   Packs/day: 1.00   Years: 20.00   Pack years: 20.00   Types: Cigarettes   Quit date: 09/11/1983   Years since quitting: 37.2  Smokeless Tobacco Former   Types: Chew   Quit date: 09/11/1983    Labs: Recent Review Flowsheet Data   There is no flowsheet data to display.      Exercise Target Goals: Exercise Program Goal: Individual exercise prescription set using results from initial 6 min walk test and THRR while considering  patient's activity barriers and safety.   Exercise Prescription Goal: Initial exercise prescription builds to 30-45 minutes a day of aerobic activity, 2-3 days per week.  Home exercise guidelines will be given to patient during program as part of exercise prescription that the participant will acknowledge.   Education: Aerobic Exercise: - Group verbal and visual presentation on the components of exercise prescription. Introduces F.I.T.T principle from ACSM for exercise prescriptions.  Reviews F.I.T.T. principles of aerobic exercise including progression. Written material given at graduation. Flowsheet Row Cardiac Rehab from 12/03/2020 in [facility] Cardiac and Pulmonary  Rehab  Education need identified 09/16/20  Date 10/08/20  Educator [place]  Instruction Review Code 1- Verbalizes Understanding       Education: Resistance Exercise: - Group verbal and visual presentation on the components of exercise prescription. Introduces F.I.T.T principle from ACSM for exercise prescriptions  Reviews F.I.T.T. principles of resistance exercise including progression. Written material given at graduation. Flowsheet Row Cardiac Rehab from 12/03/2020 in [facility] Cardiac and Pulmonary Rehab  Date 10/15/20  Educator [facility]  Instruction Review Code 1- Verbalizes  Understanding        Education: Exercise & Equipment Safety: - Individual verbal instruction and demonstration of equipment use and safety with use of the equipment. Flowsheet Row Cardiac Rehab from 12/03/2020 in [facility] Cardiac and Pulmonary Rehab  Date 09/10/20  Educator [facility]  Instruction Review Code 1- Verbalizes Understanding       Education: Exercise Physiology & General Exercise Guidelines: - Group verbal and written instruction with models to review the exercise physiology of the cardiovascular system and associated critical values. Provides general exercise guidelines with specific guidelines to those with heart or lung disease.  Flowsheet Row Cardiac Rehab from 12/03/2020 in [facility] Cardiac and Pulmonary Rehab  Date 12/03/20  Educator [facility]  Instruction Review Code 1- Verbalizes Understanding       Education: Flexibility, Balance, Mind/Body Relaxation: - Group verbal and visual presentation with interactive activity on the components of exercise prescription. Introduces F.I.T.T principle from ACSM for exercise prescriptions. Reviews F.I.T.T. principles of flexibility and balance exercise training including progression. Also discusses the mind body connection.  Reviews various relaxation techniques to help reduce and manage stress (i.e. Deep breathing, progressive muscle relaxation, and visualization). Balance handout provided to take home. Written material given at graduation.   Activity Barriers & Risk Stratification:  Activity Barriers & Cardiac Risk Stratification - 09/16/20 1520       Activity Barriers & Cardiac Risk Stratification   Activity Barriers Right Knee Replacement;Arthritis;Joint Problems;Balance Concerns;History of Falls;Muscular Weakness;Shortness of Breath;Deconditioning;Back Problems   arth in back, bilateral knee pain   Cardiac Risk Stratification Moderate             6 Minute Walk:  6 Minute Walk     Row Name 09/16/20 1514         6 Minute Walk    Phase Initial     Distance 1165 feet     Walk Time 6 minutes     # of Rest Breaks 0     MPH 2.21     METS 2.45     RPE 13     VO2 Peak 8.57     Symptoms Yes (comment)     Comments shuffling feet, felt stumbly at end     Resting HR 80 bpm     Resting BP 154/62     Resting Oxygen Saturation  99 %     Exercise Oxygen Saturation  during 6 min walk 99 %     Max Ex. HR 118 bpm     Max Ex. BP 148/74     2 Minute Post BP 112/62              Oxygen Initial Assessment:   Oxygen Re-Evaluation:   Oxygen Discharge (Final Oxygen Re-Evaluation):   Initial Exercise Prescription:  Initial Exercise Prescription - 09/16/20 1500       Date of Initial Exercise RX and Referring Provider   Date 09/16/20    Referring Provider [name] MD      Treadmill  MPH 1.9    Grade 0.5    Minutes 15    METs 2.59      REL-XR   Level 1    Speed 50    Minutes 15    METs 2.5      T5 Nustep   Level 1    SPM 80    Minutes 15    METs 2.5      Prescription Details   Frequency (times per week) 2    Duration Progress to 30 minutes of continuous aerobic without signs/symptoms of physical distress      Intensity   THRR 40-80% of Max Heartrate 104-129    Ratings of Perceived Exertion 11-13    Perceived Dyspnea 0-4      Progression   Progression Continue to progress workloads to maintain intensity without signs/symptoms of physical distress.      Resistance Training   Training Prescription Yes    Weight 3 lb    Reps 10-15             Perform Capillary Blood Glucose checks as needed.  Exercise Prescription Changes:   Exercise Prescription Changes     Row Name 09/16/20 1500 09/21/20 0900 10/07/20 1300 10/20/20 1300 11/03/20 1500     Response to Exercise   Blood Pressure (Admit) 154/62 124/74 110/60 104/60 106/60   Blood Pressure (Exercise) 148/74 116/64 118/62 136/74 122/60   Blood Pressure (Exit) 112/62 108/64 98/60 124/74 112/62   Heart Rate (Admit) 80 bpm 73  bpm 80 bpm 69 bpm 85 bpm   Heart Rate (Exercise) 118 bpm 110 bpm 120 bpm 84 bpm 102 bpm   Heart Rate (Exit) 90 bpm 75 bpm 88 bpm 78 bpm 82 bpm   Oxygen Saturation (Admit) 99 % -- -- -- 96 %   Oxygen Saturation (Exercise) 99 % -- -- -- 95 %   Oxygen Saturation (Exit) -- -- -- -- 96 %   Rating of Perceived Exertion (Exercise) _0 Perceived Dyspnea (Exercise) -- -- -- -- 0   Symptoms shuffling feet, felt stumbly at end -- -- -- none   Comments walk test results first day -- -- --   Duration -- -- Continue with 30 min of aerobic exercise without signs/symptoms of physical distress. Continue with 30 min of aerobic exercise without signs/symptoms of physical distress. Continue with 30 min of aerobic exercise without signs/symptoms of physical distress.   Intensity -- -- THRR unchanged THRR unchanged THRR unchanged     Progression   Progression -- Continue to progress workloads to maintain intensity without signs/symptoms of physical distress. Continue to progress workloads to maintain intensity without signs/symptoms of physical distress. Continue to progress workloads to maintain intensity without signs/symptoms of physical distress. Continue to progress workloads to maintain intensity without signs/symptoms of physical distress.   Average METs -- 2.59 2.75 2.6 3.4     Resistance Training   Training Prescription -- Yes Yes Yes Yes   Weight -- 3 lb 4 lb 4 lb 4 lb   Reps -- 10-15 10-15 10-15 10-15     Interval Training   Interval Training -- -- -- -- No     Treadmill   MPH -- 1.9 1.9 -- --   Grade -- 0.5 0.5 -- --   Minutes -- 15 15 -- --   METs -- 2.59 2.59 -- --     Recumbant Bike   Level -- -- _1 Minutes -- --  _0 METs -- -- 2.9 2.93 3     NuStep   Level -- -- -- 5 6   Minutes -- -- -- 15 15   METs -- -- -- 2.3 3.8     T5 Nustep   Level -- 1 -- -- --   SPM -- 80 -- -- --   Minutes -- 15 -- -- --    Row Name 11/18/20 1500 12/02/20 1000            Response to Exercise   Blood Pressure (Admit) 108/58 102/60      Blood Pressure (Exercise) 136/64 124/64      Blood Pressure (Exit) 122/64 110/70      Heart Rate (Admit) 81 bpm 72 bpm      Heart Rate (Exercise) 100 bpm 94 bpm      Heart Rate (Exit) 78 bpm 85 bpm      Oxygen Saturation (Admit) 96 % 97 %      Oxygen Saturation (Exercise) 95 % 96 %      Oxygen Saturation (Exit) 95 % 98 %      Rating of Perceived Exertion (Exercise) 13 13      Symptoms none none      Duration Continue with 30 min of aerobic exercise without signs/symptoms of physical distress. Continue with 30 min of aerobic exercise without signs/symptoms of physical distress.      Intensity THRR unchanged THRR unchanged             Progression      Progression Continue to progress workloads to maintain intensity without signs/symptoms of physical distress. Continue to progress workloads to maintain intensity without signs/symptoms of physical distress.      Average METs 2.8 2.77             Resistance Training      Training Prescription Yes Yes      Weight 4 lb 4 lb      Reps 10-15 10-15             Interval Training      Interval Training No No             Treadmill      MPH 2 2      Grade 0.5 0.5      Minutes 15 15      METs 2.67 2.67             Recumbant Bike      Level 8 9      RPM -- 60      Minutes 15 15      METs 2.94 2.94             NuStep      Level -- 6      SPM -- 80      Minutes -- 15      METs -- 2.8              Exercise Comments:   Exercise Comments     Row Name 12/03/20 1157           Exercise Comments Carlos Mathis graduated today from  rehab with 35 sessions completed.  Details of the patient's exercise prescription and what He needs to do in order to continue the prescription and progress were discussed with patient.  Patient was given a copy of prescription and goals.  Patient verbalized understanding.  Carlos Mathis plans to continue to exercise by  ride exercise bike at home, swim  and walk..                Exercise Goals and Review:   Exercise Goals     Row Name 09/16/20 1522             Exercise Goals   Increase Physical Activity Yes       Intervention Provide advice, education, support and counseling about physical activity/exercise needs.;Develop an individualized exercise prescription for aerobic and resistive training based on initial evaluation findings, risk stratification, comorbidities and participant's personal goals.       Expected Outcomes Short Term: Attend rehab on a regular basis to increase amount of physical activity.;Long Term: Add in home exercise to make exercise part of routine and to increase amount of physical activity.;Long Term: Exercising regularly at least 3-5 days a week.       Increase Strength and Stamina Yes       Intervention Provide advice, education, support and counseling about physical activity/exercise needs.;Develop an individualized exercise prescription for aerobic and resistive training based on initial evaluation findings, risk stratification, comorbidities and participant's personal goals.       Expected Outcomes Short Term: Increase workloads from initial exercise prescription for resistance, speed, and METs.;Short Term: Perform resistance training exercises routinely during rehab and add in resistance training at home;Long Term: Improve cardiorespiratory fitness, muscular endurance and strength as measured by increased METs and functional capacity (6MWT)       Able to understand and use rate of perceived exertion (RPE) scale Yes       Intervention Provide education and explanation on how to use RPE scale       Expected Outcomes Short Term: Able to use RPE daily in rehab to express subjective intensity level;Long Term:  Able to use RPE to guide intensity level when exercising independently       Able to understand and use Dyspnea scale Yes       Intervention Provide education and explanation on how to use Dyspnea scale        Expected Outcomes Short Term: Able to use Dyspnea scale daily in rehab to express subjective sense of shortness of breath during exertion;Long Term: Able to use Dyspnea scale to guide intensity level when exercising independently       Knowledge and understanding of Target Heart Rate Range (THRR) Yes       Intervention Provide education and explanation of THRR including how the numbers were predicted and where they are located for reference       Expected Outcomes Short Term: Able to state/look up THRR;Long Term: Able to use THRR to govern intensity when exercising independently;Short Term: Able to use daily as guideline for intensity in rehab       Able to check pulse independently Yes       Intervention Provide education and demonstration on how to check pulse in carotid and radial arteries.;Review the importance of being able to check your own pulse for safety during independent exercise       Expected Outcomes Short Term: Able to explain why pulse checking is important during independent exercise;Long Term: Able to check pulse independently and accurately       Understanding of Exercise Prescription Yes       Intervention Provide education, explanation, and written materials on patient's individual exercise prescription       Expected Outcomes Short Term: Able to explain program exercise prescription;Long Term: Able to explain home exercise prescription  to exercise independently                Exercise Goals Re-Evaluation :  Exercise Goals Re-Evaluation     Row Name 09/17/20 1039 10/07/20 1332 10/20/20 1310 11/03/20 1539 11/17/20 1116     Exercise Goal Re-Evaluation   Exercise Goals Review Increase Physical Activity;Able to understand and use rate of perceived exertion (RPE) scale;Knowledge and understanding of Target Heart Rate Range (THRR);Understanding of Exercise Prescription;Increase Strength and Stamina;Able to check pulse independently Increase Physical Activity;Increase  Strength and Stamina Increase Physical Activity;Increase Strength and Stamina Increase Physical Activity;Increase Strength and Stamina;Understanding of Exercise Prescription Increase Physical Activity;Increase Strength and Stamina;Understanding of Exercise Prescription   Comments Reviewed RPE and dyspnea scales, THR and program prescription with pt today.  Pt voiced understanding and was given a copy of goals to take home. Carlos Mathis is tolerating exercise well and reaches his THR range.  He has worked up to prescribed speed on TM.  We will continue to monitor progress. Carlos Mathis attends consistently.  Staff will encourage trying 5 lb for strength work. Carlos Mathis is doing well in rehab.  He is now on level 6 for the NuStep.  We will continue to montior his progress. Carlos Mathis is attending rehab consistently. He reports it is hard to exercise at home as his equipment is upstairs. Discussed other options such as walking outside when it is nice and going early in the am before it gets too hot.   Expected Outcomes Short: Use RPE daily to regulate intensity. Long: Follow program prescription in THR. Short:  continue to follow program prescription Long: improve overall stamina Short: maintain consistent attendance Long:  increase MET level Short: Try to increase handweights Long: Continue to improve stamina Short: Walk at home outside - in am when very hot. Long: Continue to improve stamina    Row Name 12/02/20 1055             Exercise Goal Re-Evaluation   Exercise Goals Review Increase Physical Activity;Increase Strength and Stamina;Understanding of Exercise Prescription       Comments Carlos Mathis is doing well in rehab. He is now up to level 9 on the Recumbant bike and works in appropriate RPE range. Will continue to monitor.       Expected Outcomes Short: Continue to increase loads with exercise Long: Improve overall MET level                Discharge Exercise Prescription (Final Exercise Prescription Changes):  Exercise  Prescription Changes - 12/02/20 1000       Response to Exercise   Blood Pressure (Admit) 102/60    Blood Pressure (Exercise) 124/64    Blood Pressure (Exit) 110/70    Heart Rate (Admit) 72 bpm    Heart Rate (Exercise) 94 bpm    Heart Rate (Exit) 85 bpm    Oxygen Saturation (Admit) 97 %    Oxygen Saturation (Exercise) 96 %    Oxygen Saturation (Exit) 98 %    Rating of Perceived Exertion (Exercise) 13    Symptoms none    Duration Continue with 30 min of aerobic exercise without signs/symptoms of physical distress.    Intensity THRR unchanged      Progression   Progression Continue to progress workloads to maintain intensity without signs/symptoms of physical distress.    Average METs 2.77      Resistance Training   Training Prescription Yes    Weight 4 lb    Reps 10-15  Interval Training   Interval Training No      Treadmill   MPH 2    Grade 0.5    Minutes 15    METs 2.67      Recumbant Bike   Level 9    RPM 60    Minutes 15    METs 2.94      NuStep   Level 6    SPM 80    Minutes 15    METs 2.8             Nutrition:  Target Goals: Understanding of nutrition guidelines, daily intake of sodium <1595m, cholesterol <2074m calories 30% from fat and 7% or less from saturated fats, daily to have 5 or more servings of fruits and vegetables.  Education: All About Nutrition: -Group instruction provided by verbal, written material, interactive activities, discussions, models, and posters to present general guidelines for heart healthy nutrition including fat, fiber, MyPlate, the role of sodium in heart healthy nutrition, utilization of the nutrition label, and utilization of this knowledge for meal planning. Follow up email sent as well. Written material given at graduation. Flow[facility]  Education need identified 09/16/20  Date 10/29/20  Educator [facility]ar[facility]ruction Review Code 1- Verbalizes  Understanding       Biometrics:  Pre Biometrics - 09/16/20 1523       Pre Biometrics   Height 5' 8.7" (1.745 m)    Weight 192 lb (87.1 kg)    BMI (Calculated) 28.6    Single Leg Stand 6 seconds              Nutrition Therapy Plan and Nutrition Goals:  Nutrition Therapy & Goals - 09/29/20 1031       Nutrition Therapy   Diet Heart healthy, low Na, diabetes friendly    Drug/Food Interactions Statins/Certain Fruits    Protein (specify units) 70g    Fiber 30 grams    Whole Grain Foods 3 servings    Saturated Fats 12 max. grams    Fruits and Vegetables 8 servings/day    Sodium 1.5 grams      Personal Nutrition Goals   Nutrition Goal ST: try banana "nice cream" LT: continue with heart healthy changes    Comments 7.7 A1C. 7-8am BG: 115-140 (140 due to eating out). Eats out every once in a while. Uses monkfruit and stevia. Wife hardly ever puts salt in. Breakfast (8-0-7WK bacon 1 slice every other day, 1 egg - mushrooms, low fat and low Na cheese. (coffee - creamer (no sugar)). S: Tangerine or banana Lunch (12-12:30pm): has to have lunch at this time due to downward spiral. Low Na tuna for tuna salad with some mayo and relish with solo bread (3g net carbs) two gerkins pickles. Tortilla chips (low Na, low saturated fat) with salsa. S: peanut butter and apple or cheese or carrots/celery/broccoli or cheetos if he is having a bad day. Dinner: meat (pork loin, hamburger 97% lean, chicken - dark meat, and fish) and vegetables (broccoli, asparagus, cabbage, greens like collards, turnip roots, he does not like cook tomatoes, some potatoes, green beans, spaghetti squash, makes her own tomato sauce, zucchini tots, onions, cook beans every week. Limits red meat, uses olive oil and avocado oil, uses lots of herbs and mrs dash, they like salmon and tilapia. They also have jello and whipped cream (small portion) with dinner. Discussed heart healthy and diabetes friendly eating.  Intervention  Plan   Intervention Prescribe, educate and counsel regarding individualized specific dietary modifications aiming towards targeted core components such as weight, hypertension, lipid management, diabetes, heart failure and other comorbidities.;Nutrition handout(s) given to patient.    Expected Outcomes Short Term Goal: Understand basic principles of dietary content, such as calories, fat, sodium, cholesterol and nutrients.;Short Term Goal: A plan has been developed with personal nutrition goals set during dietitian appointment.;Long Term Goal: Adherence to prescribed nutrition plan.             Nutrition Assessments:  MEDIFICTS Score Key: ?70 Need to make dietary changes  40-70 Heart Healthy Diet ? 40 Therapeutic Level Cholesterol Diet  Flowsheet Row Cardiac Rehab from 09/16/2020 in [facility] Cardiac and Pulmonary Rehab  Picture Your Plate Total Score on Admission 80      Picture Your Plate Scores: <99 Unhealthy dietary pattern with much room for improvement. 41-50 Dietary pattern unlikely to meet recommendations for good health and room for improvement. 51-60 More healthful dietary pattern, with some room for improvement.  >60 Healthy dietary pattern, although there may be some specific behaviors that could be improved.    Nutrition Goals Re-Evaluation:  Nutrition Goals Re-Evaluation     [place] Name 10/27/20 1109 11/17/20 1110           Goals   Current Weight 186 lb (84.4 kg) --      Nutrition Goal Eat lower sodium. Stay away from unhealthy foods. ST:LT: continue with healthy eating wife has him doing      Comment Carlos Mathis wife has been maintaining his diet and he feels like he eats well. His diabetes is under control and has no issues with his sugar. He states he is drinking enough water. Carlos Mathis report wife is "keeping him in line" with his nutrition. He reports no changes and reports wanting to continuing what he is doing.      Expected Outcome Short: continue heart healthy diet. Long:  maintain his sugars and diet independently. ST:LT: continue with healthy eating wife has him doing               Nutrition Goals Discharge (Final Nutrition Goals Re-Evaluation):  Nutrition Goals Re-Evaluation - 11/17/20 1110       Goals   Nutrition Goal ST:LT: continue with healthy eating wife has him doing    Comment Carlos Mathis report wife is "keeping him in line" with his nutrition. He reports no changes and reports wanting to continuing what he is doing.    Expected Outcome ST:LT: continue with healthy eating wife has him doing             Psychosocial: Target Goals: Acknowledge presence or absence of significant depression and/or stress, maximize coping skills, provide positive support system. Participant is able to verbalize types and ability to use techniques and skills needed for reducing stress and depression.   Education: Stress, Anxiety, and Depression - Group verbal and visual presentation to define topics covered.  Reviews how body is impacted by stress, anxiety, and depression.  Also discusses healthy ways to reduce stress and to treat/manage anxiety and depression.  Written material given at graduation. Flowsheet Row Cardiac Rehab from 12/03/2020 in [facility] Cardiac and Pulmonary Rehab  Date 11/26/20  Educator [facility]  Instruction Review Code 1- [employer] Understanding       Education: Sleep Hygiene -Provides group verbal and written instruction about how sleep can affect your health.  Define sleep hygiene, discuss sleep cycles and impact of sleep habits. Review good  sleep hygiene tips.    Initial Review & Psychosocial Screening:  Initial Psych Review & Screening - 09/10/20 1011       Initial Review   Current issues with Current Stress Concerns    Comments The just moved from [place] and are in a house that needs a bit of work and is stressing him out.      Family Dynamics   Good Support System? Yes    Comments He has his wfe and God for support. They wanted to move  back to his home state and his wife enjoyed it here .He has a positive outlook on his health.      Barriers   Psychosocial barriers to participate in program The patient should benefit from training in stress management and relaxation.      Screening Interventions   Interventions To provide support and resources with identified psychosocial needs;Provide feedback about the scores to participant;Encouraged to exercise    Expected Outcomes Short Term goal: Utilizing psychosocial counselor, staff and physician to assist with identification of specific Stressors or current issues interfering with healing process. Setting desired goal for each stressor or current issue identified.;Long Term Goal: Stressors or current issues are controlled or eliminated.;Short Term goal: Identification and review with participant of any Quality of Life or Depression concerns found by scoring the questionnaire.;Long Term goal: The participant improves quality of Life and PHQ9 Scores as seen by post scores and/or verbalization of changes             Quality of Life Scores:   Quality of Life - 09/16/20 1523       Quality of Life   Select Quality of Life      Quality of Life Scores   Health/Function Pre 24.33 %    Socioeconomic Pre 23.75 %    Psych/Spiritual Pre 29.64 %    Family Pre 26.4 %    GLOBAL Pre 25.56 %            Scores of 19 and below usually indicate a poorer quality of life in these areas.  A difference of  2-3 points is a clinically meaningful difference.  A difference of 2-3 points in the total score of the Quality of Life Index has been associated with significant improvement in overall quality of life, self-image, physical symptoms, and general health in studies assessing change in quality of life.  PHQ-9: Recent Review Flowsheet Data     Depression screen [facility] 2/9 10/27/2020 09/16/2020   Decreased Interest 0 1   Down, Depressed, Hopeless 0 1   PHQ - 2 Score 0 2   Altered sleeping 0 0    Tired, decreased energy 0 1   Change in appetite 1 1   Feeling bad or failure about yourself  0 0   Trouble concentrating 3 1   Moving slowly or fidgety/restless 0 1   Suicidal thoughts 0 0   PHQ-9 Score 4 6   Difficult doing work/chores Not difficult at all Not difficult at all      Interpretation of Total Score  Total Score Depression Severity:  1-4 = Minimal depression, 5-9 = Mild depression, 10-14 = Moderate depression, 15-19 = Moderately severe depression, 20-27 = Severe depression   Psychosocial Evaluation and Intervention:  Psychosocial Evaluation - 09/10/20 1016       Psychosocial Evaluation & Interventions   Interventions Encouraged to exercise with the program and follow exercise prescription;Relaxation education;Stress management education    Comments The just moved  from [place] and are in a house that needs a bit of work and is stressing him out. He has his wfe and God for support. They wanted to move back to his home state and his wife enjoyed it here .He has a positive outlook on his health.    Expected Outcomes Short: Exercise regularly to support mental health and notify staff of any changes. Long: maintain mental health and well being through teaching of rehab or prescribed medications independently.    Continue Psychosocial Services  Follow up required by staff             Psychosocial Re-Evaluation:  Psychosocial Re-Evaluation     [place] Name 10/27/20 1116 11/17/20 1119           Psychosocial Re-Evaluation   Current issues with None Identified Current Stress Concerns      Comments Reviewed patient health questionnaire (PHQ-9) with patient for follow up. Previously, patients score indicated signs/symptoms of depression.  Reviewed to see if patient is improving symptom wise while in program.  Score improved and patient states that it is because he has been able to have more energy and exercise.Marland Kitchen His foundation of his home is messed up and they are getting it  fixed which was over $3000. He reports otherwise doing well aside from some back pain - he went to [place] for back pain - they think it is muscular. He likes to ride in his car to help relax. He has a good support system in his wife as well as his sister, but she is going through stress with her husbands health.      Expected Outcomes Short: Continue to attend [facility] regularly for regular exercise and social engagement. Long: Continue to improve symptoms and manage a positive mental state. Short: Continue to attend [facility] regularly for regular exercise and social engagement, follow up with doctor about back. Long: Continue to improve symptoms and manage a positive mental state.      Interventions Encouraged to attend Cardiac Rehabilitation for the exercise Encouraged to attend Cardiac Rehabilitation for the exercise      Continue Psychosocial Services  No Follow up required No Follow up required             Initial Review      Source of Stress Concerns -- Financial              Psychosocial Discharge (Final Psychosocial Re-Evaluation):  Psychosocial Re-Evaluation - 11/17/20 1119       Psychosocial Re-Evaluation   Current issues with Current Stress Concerns    Comments His foundation of his home is messed up and they are getting it fixed which was over $3000. He reports otherwise doing well aside from some back pain - he went to [place] for back pain - they think it is muscular. He likes to ride in his car to help relax. He has a good support system in his wife as well as his sister, but she is going through stress with her husbands health.    Expected Outcomes Short: Continue to attend [facility] regularly for regular exercise and social engagement, follow up with doctor about back. Long: Continue to improve symptoms and manage a positive mental state.    Interventions Encouraged to attend Cardiac Rehabilitation for the exercise    Continue Psychosocial Services  No Follow up required       Initial Review   Source of Stress Concerns Financial  Vocational Rehabilitation: Provide vocational rehab assistance to qualifying candidates.   Vocational Rehab Evaluation & Intervention:   Education: Education Goals: Education classes will be provided on a variety of topics geared toward better understanding of heart health and risk factor modification. Participant will state understanding/return demonstration of topics presented as noted by education test scores.  Learning Barriers/Preferences:  Learning Barriers/Preferences - 09/10/20 1009       Learning Barriers/Preferences   Learning Barriers None    Learning Preferences None             General Cardiac Education Topics:  AED/CPR: - Group verbal and written instruction with the use of models to demonstrate the basic use of the AED with the basic ABC's of resuscitation.   Anatomy and Cardiac Procedures: - Group verbal and visual presentation and models provide information about basic cardiac anatomy and function. Reviews the testing methods done to diagnose heart disease and the outcomes of the test results. Describes the treatment choices: Medical Management, Angioplasty, or Coronary Bypass Surgery for treating various heart conditions including Myocardial Infarction, Angina, Valve Disease, and Cardiac Arrhythmias.  Written material given at graduation. Flowsheet Row Cardiac Rehab from 12/03/2020 in [facility] Cardiac and Pulmonary Rehab  Education need identified 09/16/20  Date 10/15/20  Educator [facility]  Instruction Review Code 1- Verbalizes Understanding       Medication Safety: - Group verbal and visual instruction to review commonly prescribed medications for heart and lung disease. Reviews the medication, class of the drug, and side effects. Includes the steps to properly store meds and maintain the prescription regimen.  Written material given at graduation. Flowsheet Row Cardiac Rehab from 12/03/2020 in  [facility] Cardiac and Pulmonary Rehab  Date 11/05/20  Educator [facility]  Instruction Review Code 1- Verbalizes Understanding       Intimacy: - Group verbal instruction through game format to discuss how heart and lung disease can affect sexual intimacy. Written material given at graduation.. Flowsheet Row Cardiac Rehab from 12/03/2020 in [facility] Cardiac and Pulmonary Rehab  Date 10/08/20  Educator [place]  Instruction Review Code 1- Verbalizes Understanding       Know Your Numbers and Heart Failure: - Group verbal and visual instruction to discuss disease risk factors for cardiac and pulmonary disease and treatment options.  Reviews associated critical values for Overweight/Obesity, Hypertension, Cholesterol, and Diabetes.  Discusses basics of heart failure: signs/symptoms and treatments.  Introduces Heart Failure Zone chart for action plan for heart failure.  Written material given at graduation. Flowsheet Row Cardiac Rehab from 12/03/2020 in [facility] Cardiac and Pulmonary Rehab  Date 11/12/20  Educator [facility]  Instruction Review Code 1- Verbalizes Understanding       Infection Prevention: - Provides verbal and written material to individual with discussion of infection control including proper hand washing and proper equipment cleaning during exercise session. Flowsheet Row Cardiac Rehab from 12/03/2020 in [facility] Cardiac and Pulmonary Rehab  Date 09/10/20  Educator [facility]  Instruction Review Code 1- Verbalizes Understanding       Falls Prevention: - Provides verbal and written material to individual with discussion of falls prevention and safety. Flowsheet Row Cardiac Rehab from 12/03/2020 in [facility] Cardiac and Pulmonary Rehab  Date 09/10/20  Educator [facility]  Instruction Review Code 1- Verbalizes Understanding       Other: -Provides group and verbal instruction on various topics (see comments)   Knowledge Questionnaire Score:  Knowledge Questionnaire Score - 09/16/20 1524       Knowledge Questionnaire  Score   Pre  Score 19/26 Education Focus: Angina, nurtrition, exercise             Core Components/Risk Factors/Patient Goals at Admission:  Personal Goals and Risk Factors at Admission - 09/16/20 1524       Core Components/Risk Factors/Patient Goals on Admission    Weight Management Yes;Weight Loss    Intervention Weight Management: Develop a combined nutrition and exercise program designed to reach desired caloric intake, while maintaining appropriate intake of nutrient and fiber, sodium and fats, and appropriate energy expenditure required for the weight goal.;Weight Management: Provide education and appropriate resources to help participant work on and attain dietary goals.;Weight Management/Obesity: Establish reasonable short term and long term weight goals.    Admit Weight 192 lb (87.1 kg)    Goal Weight: Short Term 185 lb (83.9 kg)    Goal Weight: Long Term 180 lb (81.6 kg)    Expected Outcomes Short Term: Continue to assess and modify interventions until short term weight is achieved;Long Term: Adherence to nutrition and physical activity/exercise program aimed toward attainment of established weight goal;Weight Loss: Understanding of general recommendations for a balanced deficit meal plan, which promotes 1-2 lb weight loss per week and includes a negative energy balance of [phone] kcal/d;Understanding recommendations for meals to include 15-35% energy as protein, 25-35% energy from fat, 35-60% energy from carbohydrates, less than 268m of dietary cholesterol, 20-35 gm of total fiber daily;Understanding of distribution of calorie intake throughout the day with the consumption of 4-5 meals/snacks    Diabetes Yes    Intervention Provide education about signs/symptoms and action to take for hypo/hyperglycemia.;Provide education about proper nutrition, including hydration, and aerobic/resistive exercise prescription along with prescribed medications to achieve blood glucose in normal  ranges: Fasting glucose 65-99 mg/dL    Expected Outcomes Short Term: Participant verbalizes understanding of the signs/symptoms and immediate care of hyper/hypoglycemia, proper foot care and importance of medication, aerobic/resistive exercise and nutrition plan for blood glucose control.;Long Term: Attainment of HbA1C < 7%.    Hypertension Yes    Intervention Provide education on lifestyle modifcations including regular physical activity/exercise, weight management, moderate sodium restriction and increased consumption of fresh fruit, vegetables, and low fat dairy, alcohol moderation, and smoking cessation.;Monitor prescription use compliance.    Expected Outcomes Short Term: Continued assessment and intervention until BP is < 140/93mHG in hypertensive participants. < 130/8021mG in hypertensive participants with diabetes, heart failure or chronic kidney disease.;Long Term: Maintenance of blood pressure at goal levels.    Lipids Yes    Intervention Provide education and support for participant on nutrition & aerobic/resistive exercise along with prescribed medications to achieve LDL <40m7mDL >40mg51m Expected Outcomes Short Term: Participant states understanding of desired cholesterol values and is compliant with medications prescribed. Participant is following exercise prescription and nutrition guidelines.;Long Term: Cholesterol controlled with medications as prescribed, with individualized exercise RX and with personalized nutrition plan. Value goals: LDL < 40mg,75m > 40 mg.             Education:Diabetes - Individual verbal and written instruction to review signs/symptoms of diabetes, desired ranges of glucose level fasting, after meals and with exercise. Acknowledge that pre and post exercise glucose checks will be done for 3 sessions at entry of program. [place]  Date 09/10/20  [facility] Code  1- Verbalizes Understanding       Core Components/Risk Factors/Patient Goals Review:   Goals  and Risk Factor Review     Row Name 10/27/20 1107 11/17/20 1112           Core Components/Risk Factors/Patient Goals Review   Personal Goals Review Weight Management/Obesity;Hypertension;Heart Failure Weight Management/Obesity;Hypertension;Heart Failure;Diabetes      Review Carlos Mathis wants to lose more weight. He was 186 pounds today. He was 192 pounds when he started the program. His weight goal is to be below 180 pounds. Carlos Mathis is continuing to lose weight - today 183.6lbs. He was 192 lbs when he started the program. He continues to come to rehab and work on his nutrition with his wife. He reports his BP is about 120/70, today was lower at 108/58, but he was feeling fine. He continues to check his BP every morning. BG 110-115 in the am. He is still taking his medication as directed.      Expected Outcomes Short: lose more weight. Long: reach weight goal of 180 pounds. Short: continue to check vitals at home Long: reach weight goal of 180 pounds.               Core Components/Risk Factors/Patient Goals at Discharge (Final Review):   Goals and Risk Factor Review - 11/17/20 1112       Core Components/Risk Factors/Patient Goals Review   Personal Goals Review Weight Management/Obesity;Hypertension;Heart Failure;Diabetes    Review Carlos Mathis is continuing to lose weight - today 183.6lbs. He was 192 lbs when he started the program. He continues to come to rehab and work on his nutrition with his wife. He reports his BP is about 120/70, today was lower at 108/58, but he was feeling fine. He continues to check his BP every morning. BG 110-115 in the am. He is still taking his medication as directed.    Expected Outcomes Short: continue to check vitals at home Long: reach weight goal of 180 pounds.             ITP Comments:  ITP Comments     Row Name 09/10/20 1008 09/16/20 1514 09/17/20 1039 10/07/20 0901  11/04/20 1601   ITP Comments Virtual Visit completed. Patient informed on EP and RD appointment and 6 Minute walk test. Patient also informed of patient health questionnaires on My Chart. Patient Verbalizes understanding. Visit diagnosis can be found in [facility] 09/02/20. Completed 6MWT and gym orientation. Initial ITP created and sent for review to Dr. Emily Filbert, Medical Director. First full day of exercise!  Patient was oriented to gym and equipment including functions, settings, policies, and procedures.  Patient's individual exercise prescription and treatment plan were reviewed.  All starting workloads were established based on the results of the 6 minute walk test done at initial orientation visit.  The plan for exercise progression was also introduced and progression will be customized based on patient's performance and goals. 30 Day review completed. Medical Director ITP review done, changes made as directed, and signed approval by Medical Director.  New to program 30 Day review completed. Medical Director ITP review done, changes made as directed, and signed approval by Medical Director.   2 visits in May    Row Name 12/02/20 0641 12/03/20 1157         ITP Comments 30 Day review completed. Medical Director ITP review done, changes made as directed, and signed approval by Medical Director. Sara graduated today from  rehab with 35 sessions completed.  Details of the patient's exercise prescription and what He needs to do in order to continue the prescription and progress  were discussed with patient.  Patient was given a copy of prescription and goals.  Patient verbalized understanding.  Fransico plans to continue to exercise by ride exercise bike at home, swim and walk..               Comments: Discharge ITP

## 2020-12-08 ENCOUNTER — Other Ambulatory Visit: Payer: Self-pay

## 2020-12-08 DIAGNOSIS — I213 ST elevation (STEMI) myocardial infarction of unspecified site: Secondary | ICD-10-CM

## 2020-12-08 DIAGNOSIS — Z955 Presence of coronary angioplasty implant and graft: Secondary | ICD-10-CM

## 2020-12-08 NOTE — Progress Notes (Signed)
Daily Session Note  Patient Details  Name: Carlos Mathis MRN: [id number] Date of Birth: 09-15-1940 Referring Provider:   Flowsheet Row Cardiac Rehab from 09/16/2020 in [facility] Cardiac and Pulmonary Rehab  Referring Provider [name] MD       Encounter Date: 12/08/2020  Check In:  Session Check In - 12/08/20 1132       Check-In   Supervising physician immediately available to respond to emergencies See telemetry face sheet for immediately available ER MD    Location [facility]-Cardiac & Pulmonary Rehab    Staff Present Birdie Sons, MPA, RN;Melissa Caiola RDN, Rowe Pavy, BA, ACSM CEP, Exercise Physiologist;Joseph Tessie Fass RCP,RRT,BSRT    Virtual Visit No    Medication changes reported     No    Fall or balance concerns reported    No    Warm-up and Cool-down Performed on first and last piece of equipment    Resistance Training Performed Yes    VAD Patient? No    PAD/SET Patient? No      Pain Assessment   Currently in Pain? No/denies                Social History   Tobacco Use  Smoking Status Former   Packs/day: 1.00   Years: 20.00   Pack years: 20.00   Types: Cigarettes   Quit date: 09/11/1983   Years since quitting: 37.2  Smokeless Tobacco Former   Types: Chew   Quit date: 09/11/1983    Goals Met:  Independence with exercise equipment Exercise tolerated well No report of cardiac concerns or symptoms Strength training completed today  Goals Unmet:  Not Applicable  Comments: Pt able to follow exercise prescription today without complaint.  Will continue to monitor for progression.    Dr. Emily Filbert is Medical Director for [place].  Dr. Ottie Glazier is Medical Director for [facility] Pulmonary Rehabilitation.

## 2020-12-10 ENCOUNTER — Other Ambulatory Visit: Payer: Self-pay

## 2020-12-10 DIAGNOSIS — Z955 Presence of coronary angioplasty implant and graft: Secondary | ICD-10-CM

## 2020-12-10 DIAGNOSIS — I213 ST elevation (STEMI) myocardial infarction of unspecified site: Secondary | ICD-10-CM

## 2020-12-10 NOTE — Progress Notes (Signed)
Daily Session Note  Patient Details  Name: Carlos Mathis MRN: [id number] Date of Birth: 1941/05/06 Referring Provider:   Flowsheet Row Cardiac [facility]  Referring Provider [name] MD       Encounter Date: 12/10/2020  Check In:  Session Check In - 12/10/20 1141       Check-In   Supervising physician immediately available to respond to emergencies See telemetry face sheet for immediately available ER MD    Location [facility]-Cardiac & Pulmonary Rehab    Staff Present Birdie Sons, MPA, RN;Melissa Caiola RDN, LDN;Nikita Surman Sherryll Burger, RN BSN;Joseph Hood RCP,RRT,BSRT    Virtual Visit No    Medication changes reported     No    Fall or balance concerns reported    No    Warm-up and Cool-down Performed on first and last piece of equipment    Resistance Training Performed Yes    VAD Patient? No    PAD/SET Patient? No      Pain Assessment   Currently in Pain? No/denies                Social History   Tobacco Use  Smoking Status Former   Packs/day: 1.00   Years: 20.00   Pack years: 20.00   Types: Cigarettes   Quit date: 09/11/1983   Years since quitting: 37.2  Smokeless Tobacco Former   Types: Chew   Quit date: 09/11/1983    Goals Met:  Independence with exercise equipment Exercise tolerated well No report of cardiac concerns or symptoms Strength training completed today  Goals Unmet:  Not Applicable  Comments: Pt able to follow exercise prescription today without complaint.  Will continue to monitor for progression.    Dr. Emily Filbert is Medical Director for [place].  Dr. Ottie Glazier is Medical Director for [facility] Pulmonary Rehabilitation.

## 2020-12-15 NOTE — Patient Instructions (Signed)
Discharge Patient Instructions  Patient Details  Name: Carlos Mathis MRN: [id number] Date of Birth: 28-Dec-1940 Referring Provider:  [name], MD  Reason for Discharge:  Patient reached a stable level of exercise. Patient independent in their exercise. Patient has met program and personal goals.  Smoking History:  Social History   Tobacco Use  Smoking Status Former   Packs/day: 1.00   Years: 20.00   Pack years: 20.00   Types: Cigarettes   Quit date: 09/11/1983   Years since quitting: 37.2  Smokeless Tobacco Former   Types: Chew   Quit date: 09/11/1983    Diagnosis:  ST elevation myocardial infarction (STEMI), unspecified artery ([place])  Status post coronary artery stent placement  Initial Exercise Prescription:  Initial Exercise Prescription - 09/16/20 1500       Date of Initial Exercise RX and Referring Provider   Date 09/16/20    Referring Provider [name] MD      Treadmill   MPH 1.9    Grade 0.5    Minutes 15    METs 2.59      REL-XR   Level 1    Speed 50    Minutes 15    METs 2.5      T5 Nustep   Level 1    SPM 80    Minutes 15    METs 2.5      Prescription Details   Frequency (times per week) 2    Duration Progress to 30 minutes of continuous aerobic without signs/symptoms of physical distress      Intensity   THRR 40-80% of Max Heartrate 104-129    Ratings of Perceived Exertion 11-13    Perceived Dyspnea 0-4      Progression   Progression Continue to progress workloads to maintain intensity without signs/symptoms of physical distress.      Resistance Training   Training Prescription Yes    Weight 3 lb    Reps 10-15             Discharge Exercise Prescription (Final Exercise Prescription Changes):  Exercise Prescription Changes - 12/15/20 1400       Response to Exercise   Blood Pressure (Admit) 106/60    Blood Pressure (Exit) 122/62    Heart Rate (Admit) 72 bpm    Heart Rate (Exercise) 83 bpm    Heart  Rate (Exit) 71 bpm    Oxygen Saturation (Admit) 96 %    Oxygen Saturation (Exercise) 95 %    Oxygen Saturation (Exit) 98 %    Rating of Perceived Exertion (Exercise) 12    Symptoms none    Duration Continue with 30 min of aerobic exercise without signs/symptoms of physical distress.    Intensity THRR unchanged      Progression   Progression Continue to progress workloads to maintain intensity without signs/symptoms of physical distress.    Average METs 2.5      Resistance Training   Training Prescription Yes    Weight 4 lb    Reps 10-15      Interval Training   Interval Training No      NuStep   Level 6    Minutes 15    METs 2      Biostep-RELP   Level 1    Minutes 15             Functional Capacity:  6 Minute Walk     Row Name 09/16/20 1514 12/08/20 1232  6 Minute Walk   Phase Initial Discharge    Distance 1165 feet 1200 feet    Distance % Change -- 3 %    Distance Feet Change -- 35 ft    Walk Time 6 minutes 6 minutes    # of Rest Breaks 0 0    MPH 2.21 2.27    METS 2.45 1.93    RPE 13 13    Perceived Dyspnea  -- 0    VO2 Peak 8.57 6.75    Symptoms Yes (comment) No    Comments shuffling feet, felt stumbly at end --    Resting HR 80 bpm 68 bpm    Resting BP 154/62 114/56    Resting Oxygen Saturation  99 % 95 %    Exercise Oxygen Saturation  during 6 min walk 99 % 97 %    Max Ex. HR 118 bpm 76 bpm    Max Ex. BP 148/74 108/58    2 Minute Post BP 112/62 --            Nutrition:  Nutrition Therapy & Goals - 09/29/20 1031       Nutrition Therapy   Diet Heart healthy, low Na, diabetes friendly    Drug/Food Interactions Statins/Certain Fruits    Protein (specify units) 70g    Fiber 30 grams    Whole Grain Foods 3 servings    Saturated Fats 12 max. grams    Fruits and Vegetables 8 servings/day    Sodium 1.5 grams      Personal Nutrition Goals   Nutrition Goal ST: try banana "nice cream" LT: continue with heart healthy changes     Comments 7.7 A1C. 7-8am BG: 115-140 (140 due to eating out). Eats out every once in a while. Uses monkfruit and stevia. Wife hardly ever puts salt in. Breakfast (6-4QI): bacon 1 slice every other day, 1 egg - mushrooms, low fat and low Na cheese. (coffee - creamer (no sugar)). S: Tangerine or banana Lunch (12-12:30pm): has to have lunch at this time due to downward spiral. Low Na tuna for tuna salad with some mayo and relish with solo bread (3g net carbs) two gerkins pickles. Tortilla chips (low Na, low saturated fat) with salsa. S: peanut butter and apple or cheese or carrots/celery/broccoli or cheetos if he is having a bad day. Dinner: meat (pork loin, hamburger 97% lean, chicken - dark meat, and fish) and vegetables (broccoli, asparagus, cabbage, greens like collards, turnip roots, he does not like cook tomatoes, some potatoes, green beans, spaghetti squash, makes her own tomato sauce, zucchini tots, onions, cook beans every week. Limits red meat, uses olive oil and avocado oil, uses lots of herbs and mrs dash, they like salmon and tilapia. They also have jello and whipped cream (small portion) with dinner. Discussed heart healthy and diabetes friendly eating.      Intervention Plan   Intervention Prescribe, educate and counsel regarding individualized specific dietary modifications aiming towards targeted core components such as weight, hypertension, lipid management, diabetes, heart failure and other comorbidities.;Nutrition handout(s) given to patient.    Expected Outcomes Short Term Goal: Understand basic principles of dietary content, such as calories, fat, sodium, cholesterol and nutrients.;Short Term Goal: A plan has been developed with personal nutrition goals set during dietitian appointment.;Long Term Goal: Adherence to prescribed nutrition plan.            Goals reviewed with patient; copy given to patient.

## 2020-12-17 ENCOUNTER — Emergency Department: Payer: Medicare Other

## 2020-12-17 ENCOUNTER — Emergency Department
Admission: EM | Admit: 2020-12-17 | Discharge: 2020-12-17 | Disposition: A | Discharge status: Home / Self Care | Payer: Medicare Other | Attending: Emergency Medicine | Admitting: Emergency Medicine

## 2020-12-17 ENCOUNTER — Other Ambulatory Visit: Payer: Self-pay

## 2020-12-17 DIAGNOSIS — J029 Acute pharyngitis, unspecified: Secondary | ICD-10-CM | POA: Insufficient documentation

## 2020-12-17 DIAGNOSIS — Z20822 Contact with and (suspected) exposure to covid-19: Secondary | ICD-10-CM | POA: Diagnosis not present

## 2020-12-17 DIAGNOSIS — Z87891 Personal history of nicotine dependence: Secondary | ICD-10-CM | POA: Diagnosis not present

## 2020-12-17 DIAGNOSIS — R059 Cough, unspecified: Secondary | ICD-10-CM | POA: Diagnosis not present

## 2020-12-17 DIAGNOSIS — Z96698 Presence of other orthopedic joint implants: Secondary | ICD-10-CM | POA: Diagnosis not present

## 2020-12-17 DIAGNOSIS — Z7982 Long term (current) use of aspirin: Secondary | ICD-10-CM | POA: Diagnosis not present

## 2020-12-17 DIAGNOSIS — I10 Essential (primary) hypertension: Secondary | ICD-10-CM | POA: Insufficient documentation

## 2020-12-17 DIAGNOSIS — Z1152 Encounter for screening for COVID-19: Secondary | ICD-10-CM

## 2020-12-17 DIAGNOSIS — Z79899 Other long term (current) drug therapy: Secondary | ICD-10-CM | POA: Insufficient documentation

## 2020-12-17 HISTORY — DX: Essential (primary) hypertension: I10

## 2020-12-17 HISTORY — DX: Acute myocardial infarction, unspecified: I21.9

## 2020-12-17 LAB — SARS CORONAVIRUS 2 (TAT 6-24 HRS): SARS Coronavirus 2: NEGATIVE

## 2020-12-17 NOTE — ED Provider Notes (Signed)
[facility] Emergency Department Provider Note  ____________________________________________   Event Date/Time   First MD Initiated Contact with Patient 12/17/20 [phone]     (approximate)  I have reviewed the triage vital signs and the nursing notes.   HISTORY  Chief Complaint Cough   HPI Carlos Mathis is a 80 y.o. male presents to the ED with complaint of cough, sore throat for the past week.  Patient is unaware of any fever or chills.  Patient has had some body aches and soreness especially after coughing up some plain.  He is unaware of any known COVID exposure.  He states that he did sit in front of someone on Sunday that was coughing frequently.  Patient is vaccinated for COVID.  Patient states he was a former smoker and quit 1985.  He also was around secondhand smoke for approximately 40 years.         Past Medical History:  Diagnosis Date   Hypertension    MI (myocardial infarction) ([place])     There are no problems to display for this patient.   Past Surgical History:  Procedure Laterality Date   CARDIAC CATHETERIZATION     CATARACT EXTRACTION     HERNIA REPAIR     JOINT REPLACEMENT      Prior to Admission medications   Medication Sig Start Date End Date Taking? Authorizing Provider  aspirin 81 MG EC tablet Take by mouth. 10/06/04   [provider]  atorvastatin (LIPITOR) 40 MG tablet Take 1 tablet by mouth 2 (two) times daily. 07/09/19   [provider]  glipiZIDE (GLUCOTROL) 10 MG tablet Take 1 tablet by mouth 2 (two) times daily. 07/09/19   [provider]  glucose blood ([employer] BLOOD GLUCOSE TEST) test strip USE 1 STRIP FOR TESTING AS DIRECTED EVERY DAY TO TEST BLOOD SUGAR 05/18/20   [provider]  glucose blood test strip USE 1 STRIP FOR TESTING AS DIRECTED EVERY DAY TO TEST BLOOD SUGAR 05/18/20   [provider]  losartan (COZAAR) 25 MG tablet Take 1 tablet by mouth daily. 08/13/20    [provider]  metFORMIN (GLUMETZA) 500 MG (MOD) 24 hr tablet Take 1 tablet by mouth daily.    [provider]  metoprolol succinate (TOPROL-XL) 100 MG 24 hr tablet 25 mg 1 day or 1 dose. 01/17/06   [provider]  Multiple Vitamin (MULTIVITAMIN) capsule Take 1 capsule by mouth daily.    [provider]  nitroGLYCERIN (NITROSTAT) 0.4 MG SL tablet Place under the tongue. 08/13/20   [provider]  Omega-3 Fatty Acids (OMEGA-3 FISH OIL) 1200 MG CAPS Take 1 tablet by mouth daily.    [provider]  ranolazine (RANEXA) 500 MG 12 hr tablet Take 1 tablet by mouth 2 (two) times daily. 08/13/20   [provider]  Red Yeast Rice Extract (RED YEAST RICE PO) Take 1 tablet by mouth 2 (two) times daily.    [provider]  ticagrelor (BRILINTA) 90 MG TABS tablet Take 1 tablet by mouth 2 (two) times daily. 08/13/20   [provider]  Valerian Root 450 MG CAPS Take by mouth. 07/09/19   [provider]    Allergies Atorvastatin, Meperidine, and Meperidine hcl  No family history on file.  Social History Social History   Tobacco Use   Smoking status: Former    Packs/day: 1.00    Years: 20.00    Pack years: 20.00    Types: Cigarettes  Quit date: 09/11/1983    Years since quitting: 37.2   Smokeless tobacco: Former    Types: Chew    Quit date: 09/11/1983  Substance Use Topics   Alcohol use: Yes   Drug use: Not Currently    Review of Systems Constitutional: No fever/chills Eyes: No visual changes. ENT: No sore throat. Cardiovascular: Denies chest pain. Respiratory: Denies shortness of breath.  Positive productive cough. Gastrointestinal: No abdominal pain.  No nausea, no vomiting.  No diarrhea.   Genitourinary: Negative for dysuria. Musculoskeletal: Negative for muscle skeletal pain. Skin: Negative for rash. Neurological: Negative for headaches, focal weakness or  numbness.  ____________________________________________   PHYSICAL EXAM:  VITAL SIGNS: ED Triage Vitals  Enc Vitals Group     BP 12/17/20 0846 129/79     Pulse Rate 12/17/20 0846 95     Resp 12/17/20 0846 18     Temp 12/17/20 0846 98.4 F (36.9 C)     Temp Source 12/17/20 0846 Oral     SpO2 12/17/20 0846 97 %     Weight 12/17/20 0839 180 lb (81.6 kg)     Height 12/17/20 0839 5\' 10"  (1.778 m)     Head Circumference --      Peak Flow --      Pain Score 12/17/20 0839 0     Pain Loc --      Pain Edu? --      Excl. in [place]? --     Constitutional: Alert and oriented. Well appearing and in no acute distress. Eyes: Conjunctivae are normal. PERRL. EOMI. Head: Atraumatic. Nose: No congestion/rhinnorhea. Neck: No stridor.   Cardiovascular: Normal rate, regular rhythm. Grossly normal heart sounds.  Good peripheral circulation. Respiratory: Normal respiratory effort.  No retractions. Lungs CTAB.  Coarse cough was noted during exam. Gastrointestinal: Soft and nontender. No distention.  Bowel sounds normoactive x4 quadrants. Musculoskeletal: Moves upper and lower extremities any difficulty and normal gait was noted without any assistance while in the ED. Neurologic:  Normal speech and language. No gross focal neurologic deficits are appreciated. No gait instability. Skin:  Skin is warm, dry and intact. No rash noted. Psychiatric: Mood and affect are normal. Speech and behavior are normal.  ____________________________________________   LABS (all labs ordered are listed, but only abnormal results are displayed)  Labs Reviewed  SARS CORONAVIRUS 2 (TAT 6-24 HRS)   ____________________________________________  EKG   ____________________________________________  RADIOLOGY Leana Gamer, personally viewed and evaluated these images (plain radiographs) as part of my medical decision making, as well as reviewing the written report by the radiologist.   Official radiology  report(s): DG Chest 2 View  Result Date: 12/17/2020 CLINICAL DATA:  Cough and congestion EXAM: CHEST - 2 VIEW COMPARISON:  None. FINDINGS: There is atelectatic change in the left base. The lungs otherwise are clear. The heart size and pulmonary vascularity are normal. No adenopathy. There is degenerative change in the thoracic spine. IMPRESSION: Left base atelectasis.  Lungs elsewhere clear.  Heart size normal. Electronically Signed   By: Lowella Grip III M.D.   On: 12/17/2020 09:32    ____________________________________________   PROCEDURES  Procedure(s) performed (including Critical Care):  Procedures   ____________________________________________   INITIAL IMPRESSION / ASSESSMENT AND PLAN / ED COURSE  As part of my medical decision making, I reviewed the following data within the electronic MEDICAL RECORD NUMBER   80 year old male presents to the ED with complaint of cough and sore throat for the past week.  Patient  states he has had sinus drainage.  He has had COVID vaccine and is unaware of any known COVID exposure.  Chest x-ray shows left base atelectasis.  Patient is afebrile and O2 sat was 97%.  Suspicions are very low for COVID and at the time of discharge patient's COVID test was not resulted.  There may be a component of allergy and sinus drainage causing some of his symptoms.  Patient is aware that someone will call about his COVID test should it be positive.  He is to increase fluids and Tylenol as needed.  He was given information of things that he can do at home while waiting for his COVID results.  Wife was made aware that if his COVID test is negative that he is not contagious so that family members can come over the holiday weekend. ____________________________________________   FINAL CLINICAL IMPRESSION(S) / ED DIAGNOSES  Final diagnoses:  Cough  Encounter for screening for COVID-19     ED Discharge Orders     None        Note:  This document was prepared  using Dragon voice recognition software and may include unintentional dictation errors.    Johnn Hai, PA-C 12/17/20 1439    Naaman Plummer, MD 12/17/20 [phone]

## 2020-12-17 NOTE — Discharge Instructions (Addendum)
Follow-up with your primary care provider if any continued problems or concerns.  You will be called on the results of your COVID test as soon as we receive it.  If the COVID test is positive you will need to quarantine.  If your test is negative a prescription for antibiotics will be sent to the pharmacy for your sinuses.  Drink lots of fluids to stay hydrated.  Tylenol as needed for body aches, fever or headache.  Return to the emergency department if over the holiday weekend if you develop any worsening of your symptoms such as difficulty breathing or shortness of breath.

## 2020-12-17 NOTE — ED Triage Notes (Signed)
Pt c/o cough with sore throat for the past week, pt is ambulatory with a steady gait, in NAD,

## 2020-12-17 NOTE — ED Notes (Signed)
See triage note  Presents with cough   States he developed some chest congestion with cough since Sunday/Monday  denies any fever

## 2020-12-18 ENCOUNTER — Telehealth: Payer: Self-pay | Admitting: Emergency Medicine

## 2020-12-18 ENCOUNTER — Telehealth: Payer: Self-pay

## 2020-12-18 MED ORDER — AMOXICILLIN-POT CLAVULANATE 875-125 MG PO TABS: 1.0000 | ORAL_TABLET | Freq: Two times a day (BID) | ORAL | 0 refills | Status: AC

## 2020-12-18 NOTE — Telephone Encounter (Signed)
Pt. Reports he was seen in [facility] ED yesterday.States "they were going to call in an antibiotic for me, but never did." Warm transfer to hospital operator for ED transfer.

## 2020-12-18 NOTE — Telephone Encounter (Cosign Needed)
Patient called and covid test was negative.  We discussed during his ED visit yesterday if it was negative we will place him on Augmentin 875 twice daily to treat his sinuses which is currently causing him a lot of drainage.  He is to follow-up with his PCP if any continued problems.  Patient was made aware that the prescription was sent to the pharmacy.

## 2020-12-29 ENCOUNTER — Encounter: Payer: Self-pay | Admitting: *Deleted

## 2020-12-29 DIAGNOSIS — I213 ST elevation (STEMI) myocardial infarction of unspecified site: Secondary | ICD-10-CM

## 2020-12-30 ENCOUNTER — Encounter: Payer: Self-pay | Admitting: *Deleted

## 2020-12-30 DIAGNOSIS — I213 ST elevation (STEMI) myocardial infarction of unspecified site: Secondary | ICD-10-CM

## 2020-12-30 DIAGNOSIS — Z955 Presence of coronary angioplasty implant and graft: Secondary | ICD-10-CM

## 2020-12-30 NOTE — Progress Notes (Signed)
Cardiac Individual Treatment Plan  Patient Details  Name: Carlos Mathis MRN: [id number] Date of Birth: 01-Feb-1941 Referring Provider:   Flowsheet Row Cardiac Rehab from 09/16/2020 in [facility] Cardiac and Pulmonary Rehab  Referring Provider [name] MD       Initial Encounter Date:  Flowsheet Row Cardiac Rehab from 09/16/2020 in [facility] Cardiac and Pulmonary Rehab  Date 09/16/20       Visit Diagnosis: ST elevation myocardial infarction (STEMI), unspecified artery ([place])  Status post coronary artery stent placement  Patient's Home Medications on Admission:  Current Outpatient Medications:    aspirin 81 MG EC tablet, Take by mouth., Disp: , Rfl:    atorvastatin (LIPITOR) 40 MG tablet, Take 1 tablet by mouth 2 (two) times daily., Disp: , Rfl:    glipiZIDE (GLUCOTROL) 10 MG tablet, Take 1 tablet by mouth 2 (two) times daily., Disp: , Rfl:    glucose blood ([employer] BLOOD GLUCOSE TEST) test strip, USE 1 STRIP FOR TESTING AS DIRECTED EVERY DAY TO TEST BLOOD SUGAR, Disp: , Rfl:    glucose blood test strip, USE 1 STRIP FOR TESTING AS DIRECTED EVERY DAY TO TEST BLOOD SUGAR, Disp: , Rfl:    losartan (COZAAR) 25 MG tablet, Take 1 tablet by mouth daily., Disp: , Rfl:    metFORMIN (GLUMETZA) 500 MG (MOD) 24 hr tablet, Take 1 tablet by mouth daily., Disp: , Rfl:    metoprolol succinate (TOPROL-XL) 100 MG 24 hr tablet, 25 mg 1 day or 1 dose., Disp: , Rfl:    Multiple Vitamin (MULTIVITAMIN) capsule, Take 1 capsule by mouth daily., Disp: , Rfl:    nitroGLYCERIN (NITROSTAT) 0.4 MG SL tablet, Place under the tongue., Disp: , Rfl:    Omega-3 Fatty Acids (OMEGA-3 FISH OIL) 1200 MG CAPS, Take 1 tablet by mouth daily., Disp: , Rfl:    ranolazine (RANEXA) 500 MG 12 hr tablet, Take 1 tablet by mouth 2 (two) times daily., Disp: , Rfl:    Red Yeast Rice Extract (RED YEAST RICE PO), Take 1 tablet by mouth 2 (two) times daily., Disp: , Rfl:    ticagrelor (BRILINTA) 90 MG TABS tablet, Take 1 tablet by mouth  2 (two) times daily., Disp: , Rfl:    Valerian Root 450 MG CAPS, Take by mouth., Disp: , Rfl:   Past Medical History: Past Medical History:  Diagnosis Date   Hypertension    MI (myocardial infarction) ([place])     Tobacco Use: Social History   Tobacco Use  Smoking Status Former   Packs/day: 1.00   Years: 20.00   Pack years: 20.00   Types: Cigarettes   Quit date: 09/11/1983   Years since quitting: 37.3  Smokeless Tobacco Former   Types: Chew   Quit date: 09/11/1983    Labs: Recent Review Flowsheet Data   There is no flowsheet data to display.      Exercise Target Goals: Exercise Program Goal: Individual exercise prescription set using results from initial 6 min walk test and THRR while considering  patient's activity barriers and safety.   Exercise Prescription Goal: Initial exercise prescription builds to 30-45 minutes a day of aerobic activity, 2-3 days per week.  Home exercise guidelines will be given to patient during program as part of exercise prescription that the participant will acknowledge.   Education: Aerobic Exercise: - Group verbal and visual presentation on the components of exercise prescription. Introduces F.I.T.T principle from ACSM for exercise prescriptions.  Reviews F.I.T.T. principles of aerobic exercise including progression. Written material given  at graduation. Flowsheet Row Cardiac Rehab from 12/03/2020 in [facility] Cardiac and Pulmonary Rehab  Education need identified 09/16/20  Date 10/08/20  Educator [place]  Instruction Review Code 1- Verbalizes Understanding       Education: Resistance Exercise: - Group verbal and visual presentation on the components of exercise prescription. Introduces F.I.T.T principle from ACSM for exercise prescriptions  Reviews F.I.T.T. principles of resistance exercise including progression. Written material given at graduation. Flowsheet Row Cardiac Rehab from 12/03/2020 in [facility] Cardiac and Pulmonary Rehab  Date 10/15/20   Educator [facility]  Instruction Review Code 1- Verbalizes Understanding        Education: Exercise & Equipment Safety: - Individual verbal instruction and demonstration of equipment use and safety with use of the equipment. Flowsheet Row Cardiac Rehab from 12/03/2020 in [facility] Cardiac and Pulmonary Rehab  Date 09/10/20  Educator [facility]  Instruction Review Code 1- Verbalizes Understanding       Education: Exercise Physiology & General Exercise Guidelines: - Group verbal and written instruction with models to review the exercise physiology of the cardiovascular system and associated critical values. Provides general exercise guidelines with specific guidelines to those with heart or lung disease.  Flowsheet Row Cardiac Rehab from 12/03/2020 in [facility] Cardiac and Pulmonary Rehab  Date 12/03/20  Educator [facility]  Instruction Review Code 1- Verbalizes Understanding       Education: Flexibility, Balance, Mind/Body Relaxation: - Group verbal and visual presentation with interactive activity on the components of exercise prescription. Introduces F.I.T.T principle from ACSM for exercise prescriptions. Reviews F.I.T.T. principles of flexibility and balance exercise training including progression. Also discusses the mind body connection.  Reviews various relaxation techniques to help reduce and manage stress (i.e. Deep breathing, progressive muscle relaxation, and visualization). Balance handout provided to take home. Written material given at graduation.   Activity Barriers & Risk Stratification:  Activity Barriers & Cardiac Risk Stratification - 09/16/20 1520       Activity Barriers & Cardiac Risk Stratification   Activity Barriers Right Knee Replacement;Arthritis;Joint Problems;Balance Concerns;History of Falls;Muscular Weakness;Shortness of Breath;Deconditioning;Back Problems   arth in back, bilateral knee pain   Cardiac Risk Stratification Moderate             6 Minute Walk:  6 Minute Walk      Row Name 09/16/20 1514 12/08/20 1232       6 Minute Walk   Phase Initial Discharge    Distance 1165 feet 1200 feet    Distance % Change -- 3 %    Distance Feet Change -- 35 ft    Walk Time 6 minutes 6 minutes    # of Rest Breaks 0 0    MPH 2.21 2.27    METS 2.45 1.93    RPE 13 13    Perceived Dyspnea  -- 0    VO2 Peak 8.57 6.75    Symptoms Yes (comment) No    Comments shuffling feet, felt stumbly at end --    Resting HR 80 bpm 68 bpm    Resting BP 154/62 114/56    Resting Oxygen Saturation  99 % 95 %    Exercise Oxygen Saturation  during 6 min walk 99 % 97 %    Max Ex. HR 118 bpm 76 bpm    Max Ex. BP 148/74 108/58    2 Minute Post BP 112/62 --             Oxygen Initial Assessment:   Oxygen Re-Evaluation:   Oxygen Discharge (Final Oxygen  Re-Evaluation):   Initial Exercise Prescription:  Initial Exercise Prescription - 09/16/20 1500       Date of Initial Exercise RX and Referring Provider   Date 09/16/20    Referring Provider Paraschos, Alexander MD      Treadmill   MPH 1.9    Grade 0.5    Minutes 15    METs 2.59      REL-XR   Level 1    Speed 50    Minutes 15    METs 2.5      T5 Nustep   Level 1    SPM 80    Minutes 15    METs 2.5      Prescription Details   Frequency (times per week) 2    Duration Progress to 30 minutes of continuous aerobic without signs/symptoms of physical distress      Intensity   THRR 40-80% of Max Heartrate 104-129    Ratings of Perceived Exertion 11-13    Perceived Dyspnea 0-4      Progression   Progression Continue to progress workloads to maintain intensity without signs/symptoms of physical distress.      Resistance Training   Training Prescription Yes    Weight 3 lb    Reps 10-15             Perform Capillary Blood Glucose checks as needed.  Exercise Prescription Changes:   Exercise Prescription Changes     Row Name 09/16/20 1500 09/21/20 0900 10/07/20 1300 10/20/20 1300 11/03/20 1500      Response to Exercise   Blood Pressure (Admit) 154/62 124/74 110/60 104/60 106/60   Blood Pressure (Exercise) 148/74 116/64 118/62 136/74 122/60   Blood Pressure (Exit) 112/62 108/64 98/60 124/74 112/62   Heart Rate (Admit) 80 bpm 73 bpm 80 bpm 69 bpm 85 bpm   Heart Rate (Exercise) 118 bpm 110 bpm 120 bpm 84 bpm 102 bpm   Heart Rate (Exit) 90 bpm 75 bpm 88 bpm 78 bpm 82 bpm   Oxygen Saturation (Admit) 99 % -- -- -- 96 %   Oxygen Saturation (Exercise) 99 % -- -- -- 95 %   Oxygen Saturation (Exit) -- -- -- -- 96 %   Rating of Perceived Exertion (Exercise) 13 15 12 12 13    Perceived Dyspnea (Exercise) -- -- -- -- 0   Symptoms shuffling feet, felt stumbly at end -- -- -- none   Comments walk test results first day -- -- --   Duration -- -- Continue with 30 min of aerobic exercise without signs/symptoms of physical distress. Continue with 30 min of aerobic exercise without signs/symptoms of physical distress. Continue with 30 min of aerobic exercise without signs/symptoms of physical distress.   Intensity -- -- THRR unchanged THRR unchanged THRR unchanged     Progression   Progression -- Continue to progress workloads to maintain intensity without signs/symptoms of physical distress. Continue to progress workloads to maintain intensity without signs/symptoms of physical distress. Continue to progress workloads to maintain intensity without signs/symptoms of physical distress. Continue to progress workloads to maintain intensity without signs/symptoms of physical distress.   Average METs -- 2.59 2.75 2.6 3.4     Resistance Training   Training Prescription -- Yes Yes Yes Yes   Weight -- 3 lb 4 lb 4 lb 4 lb   Reps -- 10-15 10-15 10-15 10-15     Interval Training   Interval Training -- -- -- -- No     Treadmill  MPH -- 1.9 1.9 -- --   Grade -- 0.5 0.5 -- --   Minutes -- 15 15 -- --   METs -- 2.59 2.59 -- --     Recumbant Bike   Level -- -- 1 3 2    Minutes -- -- 15 15 15    METs -- -- 2.9  2.93 3     NuStep   Level -- -- -- 5 6   Minutes -- -- -- 15 15   METs -- -- -- 2.3 3.8     T5 Nustep   Level -- 1 -- -- --   SPM -- 80 -- -- --   Minutes -- 15 -- -- --    Row Name 11/18/20 1500 12/02/20 1000 12/15/20 1400         Response to Exercise   Blood Pressure (Admit) 108/58 102/60 106/60     Blood Pressure (Exercise) 136/64 124/64 --     Blood Pressure (Exit) 122/64 110/70 122/62     Heart Rate (Admit) 81 bpm 72 bpm 72 bpm     Heart Rate (Exercise) 100 bpm 94 bpm 83 bpm     Heart Rate (Exit) 78 bpm 85 bpm 71 bpm     Oxygen Saturation (Admit) 96 % 97 % 96 %     Oxygen Saturation (Exercise) 95 % 96 % 95 %     Oxygen Saturation (Exit) 95 % 98 % 98 %     Rating of Perceived Exertion (Exercise) 13 13 12      Symptoms none none none     Duration Continue with 30 min of aerobic exercise without signs/symptoms of physical distress. Continue with 30 min of aerobic exercise without signs/symptoms of physical distress. Continue with 30 min of aerobic exercise without signs/symptoms of physical distress.     Intensity THRR unchanged THRR unchanged THRR unchanged           Progression       Progression Continue to progress workloads to maintain intensity without signs/symptoms of physical distress. Continue to progress workloads to maintain intensity without signs/symptoms of physical distress. Continue to progress workloads to maintain intensity without signs/symptoms of physical distress.     Average METs 2.8 2.77 2.5           Resistance Training       Training Prescription Yes Yes Yes     Weight 4 lb 4 lb 4 lb     Reps 10-15 10-15 10-15           Interval Training       Interval Training No No No           Treadmill       MPH 2 2 --     Grade 0.5 0.5 --     Minutes 15 15 --     METs 2.67 2.67 --           Recumbant Bike       Level 8 9 --     RPM -- 60 --     Minutes 15 15 --     METs 2.94 2.94 --           NuStep       Level -- 6 6     SPM -- 80 --      Minutes -- 15 15     METs -- 2.8 2           Biostep-RELP  Level -- -- 1     Minutes -- -- 15             Exercise Comments:   Exercise Comments     Row Name 12/03/20 1157           Exercise Comments Error NOT graduating today                Exercise Goals and Review:   Exercise Goals     [place] Name 09/16/20 1522             Exercise Goals   Increase Physical Activity Yes       Intervention Provide advice, education, support and counseling about physical activity/exercise needs.;Develop an individualized exercise prescription for aerobic and resistive training based on initial evaluation findings, risk stratification, comorbidities and participant's personal goals.       Expected Outcomes Short Term: Attend rehab on a regular basis to increase amount of physical activity.;Long Term: Add in home exercise to make exercise part of routine and to increase amount of physical activity.;Long Term: Exercising regularly at least 3-5 days a week.       Increase Strength and Stamina Yes       Intervention Provide advice, education, support and counseling about physical activity/exercise needs.;Develop an individualized exercise prescription for aerobic and resistive training based on initial evaluation findings, risk stratification, comorbidities and participant's personal goals.       Expected Outcomes Short Term: Increase workloads from initial exercise prescription for resistance, speed, and METs.;Short Term: Perform resistance training exercises routinely during rehab and add in resistance training at home;Long Term: Improve cardiorespiratory fitness, muscular endurance and strength as measured by increased METs and functional capacity (6MWT)       Able to understand and use rate of perceived exertion (RPE) scale Yes       Intervention Provide education and explanation on how to use RPE scale       Expected Outcomes Short Term: Able to use RPE daily in rehab to express  subjective intensity level;Long Term:  Able to use RPE to guide intensity level when exercising independently       Able to understand and use Dyspnea scale Yes       Intervention Provide education and explanation on how to use Dyspnea scale       Expected Outcomes Short Term: Able to use Dyspnea scale daily in rehab to express subjective sense of shortness of breath during exertion;Long Term: Able to use Dyspnea scale to guide intensity level when exercising independently       Knowledge and understanding of Target Heart Rate Range (THRR) Yes       Intervention Provide education and explanation of THRR including how the numbers were predicted and where they are located for reference       Expected Outcomes Short Term: Able to state/look up THRR;Long Term: Able to use THRR to govern intensity when exercising independently;Short Term: Able to use daily as guideline for intensity in rehab       Able to check pulse independently Yes       Intervention Provide education and demonstration on how to check pulse in carotid and radial arteries.;Review the importance of being able to check your own pulse for safety during independent exercise       Expected Outcomes Short Term: Able to explain why pulse checking is important during independent exercise;Long Term: Able to check pulse independently and accurately       Understanding  of Exercise Prescription Yes       Intervention Provide education, explanation, and written materials on patient's individual exercise prescription       Expected Outcomes Short Term: Able to explain program exercise prescription;Long Term: Able to explain home exercise prescription to exercise independently                Exercise Goals Re-Evaluation :  Exercise Goals Re-Evaluation     Row Name 09/17/20 1039 10/07/20 1332 10/20/20 1310 11/03/20 1539 11/17/20 1116     Exercise Goal Re-Evaluation   Exercise Goals Review Increase Physical Activity;Able to understand and use  rate of perceived exertion (RPE) scale;Knowledge and understanding of Target Heart Rate Range (THRR);Understanding of Exercise Prescription;Increase Strength and Stamina;Able to check pulse independently Increase Physical Activity;Increase Strength and Stamina Increase Physical Activity;Increase Strength and Stamina Increase Physical Activity;Increase Strength and Stamina;Understanding of Exercise Prescription Increase Physical Activity;Increase Strength and Stamina;Understanding of Exercise Prescription   Comments Reviewed RPE and dyspnea scales, THR and program prescription with pt today.  Pt voiced understanding and was given a copy of goals to take home. Carlos Mathis is tolerating exercise well and reaches his THR range.  He has worked up to prescribed speed on TM.  We will continue to monitor progress. Carlos Mathis attends consistently.  Staff will encourage trying 5 lb for strength work. Carlos Mathis is doing well in rehab.  He is now on level 6 for the NuStep.  We will continue to montior his progress. Carlos Mathis is attending rehab consistently. He reports it is hard to exercise at home as his equipment is upstairs. Discussed other options such as walking outside when it is nice and going early in the am before it gets too hot.   Expected Outcomes Short: Use RPE daily to regulate intensity. Long: Follow program prescription in THR. Short:  continue to follow program prescription Long: improve overall stamina Short: maintain consistent attendance Long:  increase MET level Short: Try to increase handweights Long: Continue to improve stamina Short: Walk at home outside - in am when very hot. Long: Continue to improve stamina    Row Name 12/02/20 1055 12/15/20 1404 12/29/20 1539         Exercise Goal Re-Evaluation   Exercise Goals Review Increase Physical Activity;Increase Strength and Stamina;Understanding of Exercise Prescription -- --     Comments Carlos Mathis is doing well in rehab. He is now up to level 9 on the Recumbant bike and works in  appropriate RPE range. Will continue to monitor. Carlos Mathis will complete HT in 4 sessions. Out for ED visits and COVID exposures     Expected Outcomes Short: Continue to increase loads with exercise Long: Improve overall MET level Short:complete HT long:  maintain exercie on his own --              Discharge Exercise Prescription (Final Exercise Prescription Changes):  Exercise Prescription Changes - 12/15/20 1400       Response to Exercise   Blood Pressure (Admit) 106/60    Blood Pressure (Exit) 122/62    Heart Rate (Admit) 72 bpm    Heart Rate (Exercise) 83 bpm    Heart Rate (Exit) 71 bpm    Oxygen Saturation (Admit) 96 %    Oxygen Saturation (Exercise) 95 %    Oxygen Saturation (Exit) 98 %    Rating of Perceived Exertion (Exercise) 12    Symptoms none    Duration Continue with 30 min of aerobic exercise without signs/symptoms of physical distress.  Intensity THRR unchanged      Progression   Progression Continue to progress workloads to maintain intensity without signs/symptoms of physical distress.    Average METs 2.5      Resistance Training   Training Prescription Yes    Weight 4 lb    Reps 10-15      Interval Training   Interval Training No      NuStep   Level 6    Minutes 15    METs 2      Biostep-RELP   Level 1    Minutes 15             Nutrition:  Target Goals: Understanding of nutrition guidelines, daily intake of sodium <1535m, cholesterol <2071m calories 30% from fat and 7% or less from saturated fats, daily to have 5 or more servings of fruits and vegetables.  Education: All About Nutrition: -Group instruction provided by verbal, written material, interactive activities, discussions, models, and posters to present general guidelines for heart healthy nutrition including fat, fiber, MyPlate, the role of sodium in heart healthy nutrition, utilization of the nutrition label, and utilization of this knowledge for meal planning. Follow up email sent as  well. Written material given at graduation. Flow[facility]  Education need identified 09/16/20  Date 10/29/20  Educator [place]ruction Review Code 1- Verbalizes Understanding       Biometrics:  Pre Biometrics - 09/16/20 1523       Pre Biometrics   Height 5' 8.7" (1.745 m)    Weight 192 lb (87.1 kg)    BMI (Calculated) 28.6    Single Leg Stand 6 seconds              Nutrition Therapy Plan and Nutrition Goals:  Nutrition Therapy & Goals - 09/29/20 1031       Nutrition Therapy   Diet Heart healthy, low Na, diabetes friendly    Drug/Food Interactions Statins/Certain Fruits    Protein (specify units) 70g    Fiber 30 grams    Whole Grain Foods 3 servings    Saturated Fats 12 max. grams    Fruits and Vegetables 8 servings/day    Sodium 1.5 grams      Personal Nutrition Goals   Nutrition Goal ST: try banana "nice cream" LT: continue with heart healthy changes    Comments 7.7 A1C. 7-8am BG: 115-140 (140 due to eating out). Eats out every once in a while. Uses monkfruit and stevia. Wife hardly ever puts salt in. Breakfast (8-1-4GY bacon 1 slice every other day, 1 egg - mushrooms, low fat and low Na cheese. (coffee - creamer (no sugar)). S: Tangerine or banana Lunch (12-12:30pm): has to have lunch at this time due to downward spiral. Low Na tuna for tuna salad with some mayo and relish with solo bread (3g net carbs) two gerkins pickles. Tortilla chips (low Na, low saturated fat) with salsa. S: peanut butter and apple or cheese or carrots/celery/broccoli or cheetos if he is having a bad day. Dinner: meat (pork loin, hamburger 97% lean, chicken - dark meat, and fish) and vegetables (broccoli, asparagus, cabbage, greens like collards, turnip roots, he does not like cook tomatoes, some potatoes, green beans, spaghetti squash, makes her own tomato sauce, zucchini tots, onions, cook beans every week. Limits red meat, uses  olive oil and avocado oil, uses lots of herbs and mrs dash, they like salmon and tilapia. They also have jello  and whipped cream (small portion) with dinner. Discussed heart healthy and diabetes friendly eating.      Intervention Plan   Intervention Prescribe, educate and counsel regarding individualized specific dietary modifications aiming towards targeted core components such as weight, hypertension, lipid management, diabetes, heart failure and other comorbidities.;Nutrition handout(s) given to patient.    Expected Outcomes Short Term Goal: Understand basic principles of dietary content, such as calories, fat, sodium, cholesterol and nutrients.;Short Term Goal: A plan has been developed with personal nutrition goals set during dietitian appointment.;Long Term Goal: Adherence to prescribed nutrition plan.             Nutrition Assessments:  MEDIFICTS Score Key: ?70 Need to make dietary changes  40-70 Heart Healthy Diet ? 40 Therapeutic Level Cholesterol Diet  Flowsheet Row Cardiac Rehab from 09/16/2020 in [facility] Cardiac and Pulmonary Rehab  Picture Your Plate Total Score on Admission 80      Picture Your Plate Scores: <38 Unhealthy dietary pattern with much room for improvement. 41-50 Dietary pattern unlikely to meet recommendations for good health and room for improvement. 51-60 More healthful dietary pattern, with some room for improvement.  >60 Healthy dietary pattern, although there may be some specific behaviors that could be improved.    Nutrition Goals Re-Evaluation:  Nutrition Goals Re-Evaluation     [place] Name 10/27/20 1109 11/17/20 1110           Goals   Current Weight 186 lb (84.4 kg) --      Nutrition Goal Eat lower sodium. Stay away from unhealthy foods. ST:LT: continue with healthy eating wife has him doing      Comment Jims wife has been maintaining his diet and he feels like he eats well. His diabetes is under control and has no issues with his sugar. He states  he is drinking enough water. Carlos Mathis report wife is "keeping him in line" with his nutrition. He reports no changes and reports wanting to continuing what he is doing.      Expected Outcome Short: continue heart healthy diet. Long: maintain his sugars and diet independently. ST:LT: continue with healthy eating wife has him doing               Nutrition Goals Discharge (Final Nutrition Goals Re-Evaluation):  Nutrition Goals Re-Evaluation - 11/17/20 1110       Goals   Nutrition Goal ST:LT: continue with healthy eating wife has him doing    Comment Carlos Mathis report wife is "keeping him in line" with his nutrition. He reports no changes and reports wanting to continuing what he is doing.    Expected Outcome ST:LT: continue with healthy eating wife has him doing             Psychosocial: Target Goals: Acknowledge presence or absence of significant depression and/or stress, maximize coping skills, provide positive support system. Participant is able to verbalize types and ability to use techniques and skills needed for reducing stress and depression.   Education: Stress, Anxiety, and Depression - Group verbal and visual presentation to define topics covered.  Reviews how body is impacted by stress, anxiety, and depression.  Also discusses healthy ways to reduce stress and to treat/manage anxiety and depression.  Written material given at graduation. Flowsheet Row Cardiac Rehab from 12/03/2020 in [facility] Cardiac and Pulmonary Rehab  Date 11/26/20  Educator [facility]  Instruction Review Code 1- [employer] Understanding       Education: Sleep Hygiene -Provides group verbal and written instruction about how sleep  can affect your health.  Define sleep hygiene, discuss sleep cycles and impact of sleep habits. Review good sleep hygiene tips.    Initial Review & Psychosocial Screening:  Initial Psych Review & Screening - 09/10/20 1011       Initial Review   Current issues with Current Stress Concerns     Comments The just moved from [place] and are in a house that needs a bit of work and is stressing him out.      Family Dynamics   Good Support System? Yes    Comments He has his wfe and God for support. They wanted to move back to his home state and his wife enjoyed it here .He has a positive outlook on his health.      Barriers   Psychosocial barriers to participate in program The patient should benefit from training in stress management and relaxation.      Screening Interventions   Interventions To provide support and resources with identified psychosocial needs;Provide feedback about the scores to participant;Encouraged to exercise    Expected Outcomes Short Term goal: Utilizing psychosocial counselor, staff and physician to assist with identification of specific Stressors or current issues interfering with healing process. Setting desired goal for each stressor or current issue identified.;Long Term Goal: Stressors or current issues are controlled or eliminated.;Short Term goal: Identification and review with participant of any Quality of Life or Depression concerns found by scoring the questionnaire.;Long Term goal: The participant improves quality of Life and PHQ9 Scores as seen by post scores and/or verbalization of changes             Quality of Life Scores:   Quality of Life - 09/16/20 1523       Quality of Life   Select Quality of Life      Quality of Life Scores   Health/Function Pre 24.33 %    Socioeconomic Pre 23.75 %    Psych/Spiritual Pre 29.64 %    Family Pre 26.4 %    GLOBAL Pre 25.56 %            Scores of 19 and below usually indicate a poorer quality of life in these areas.  A difference of  2-3 points is a clinically meaningful difference.  A difference of 2-3 points in the total score of the Quality of Life Index has been associated with significant improvement in overall quality of life, self-image, physical symptoms, and general health in studies  assessing change in quality of life.  PHQ-9: Recent Review Flowsheet Data     Depression screen [facility] 2/9 10/27/2020 09/16/2020   Decreased Interest 0 1   Down, Depressed, Hopeless 0 1   PHQ - 2 Score 0 2   Altered sleeping 0 0   Tired, decreased energy 0 1   Change in appetite 1 1   Feeling bad or failure about yourself  0 0   Trouble concentrating 3 1   Moving slowly or fidgety/restless 0 1   Suicidal thoughts 0 0   PHQ-9 Score 4 6   Difficult doing work/chores Not difficult at all Not difficult at all      Interpretation of Total Score  Total Score Depression Severity:  1-4 = Minimal depression, 5-9 = Mild depression, 10-14 = Moderate depression, 15-19 = Moderately severe depression, 20-27 = Severe depression   Psychosocial Evaluation and Intervention:  Psychosocial Evaluation - 09/10/20 1016       Psychosocial Evaluation & Interventions   Interventions Encouraged to  exercise with the program and follow exercise prescription;Relaxation education;Stress management education    Comments The just moved from [place] and are in a house that needs a bit of work and is stressing him out. He has his wfe and God for support. They wanted to move back to his home state and his wife enjoyed it here .He has a positive outlook on his health.    Expected Outcomes Short: Exercise regularly to support mental health and notify staff of any changes. Long: maintain mental health and well being through teaching of rehab or prescribed medications independently.    Continue Psychosocial Services  Follow up required by staff             Psychosocial Re-Evaluation:  Psychosocial Re-Evaluation     [place] Name 10/27/20 1116 11/17/20 1119           Psychosocial Re-Evaluation   Current issues with None Identified Current Stress Concerns      Comments Reviewed patient health questionnaire (PHQ-9) with patient for follow up. Previously, patients score indicated signs/symptoms of depression.  Reviewed  to see if patient is improving symptom wise while in program.  Score improved and patient states that it is because he has been able to have more energy and exercise.Marland Kitchen His foundation of his home is messed up and they are getting it fixed which was over $3000. He reports otherwise doing well aside from some back pain - he went to [place] for back pain - they think it is muscular. He likes to ride in his car to help relax. He has a good support system in his wife as well as his sister, but she is going through stress with her husbands health.      Expected Outcomes Short: Continue to attend [facility] regularly for regular exercise and social engagement. Long: Continue to improve symptoms and manage a positive mental state. Short: Continue to attend [facility] regularly for regular exercise and social engagement, follow up with doctor about back. Long: Continue to improve symptoms and manage a positive mental state.      Interventions Encouraged to attend Cardiac Rehabilitation for the exercise Encouraged to attend Cardiac Rehabilitation for the exercise      Continue Psychosocial Services  No Follow up required No Follow up required             Initial Review      Source of Stress Concerns -- Financial              Psychosocial Discharge (Final Psychosocial Re-Evaluation):  Psychosocial Re-Evaluation - 11/17/20 1119       Psychosocial Re-Evaluation   Current issues with Current Stress Concerns    Comments His foundation of his home is messed up and they are getting it fixed which was over $3000. He reports otherwise doing well aside from some back pain - he went to [place] for back pain - they think it is muscular. He likes to ride in his car to help relax. He has a good support system in his wife as well as his sister, but she is going through stress with her husbands health.    Expected Outcomes Short: Continue to attend [facility] regularly for regular exercise and social engagement, follow up with  doctor about back. Long: Continue to improve symptoms and manage a positive mental state.    Interventions Encouraged to attend Cardiac Rehabilitation for the exercise    Continue Psychosocial Services  No Follow up required  Initial Review   Source of Stress Concerns Financial             Vocational Rehabilitation: Provide vocational rehab assistance to qualifying candidates.   Vocational Rehab Evaluation & Intervention:   Education: Education Goals: Education classes will be provided on a variety of topics geared toward better understanding of heart health and risk factor modification. Participant will state understanding/return demonstration of topics presented as noted by education test scores.  Learning Barriers/Preferences:  Learning Barriers/Preferences - 09/10/20 1009       Learning Barriers/Preferences   Learning Barriers None    Learning Preferences None             General Cardiac Education Topics:  AED/CPR: - Group verbal and written instruction with the use of models to demonstrate the basic use of the AED with the basic ABC's of resuscitation.   Anatomy and Cardiac Procedures: - Group verbal and visual presentation and models provide information about basic cardiac anatomy and function. Reviews the testing methods done to diagnose heart disease and the outcomes of the test results. Describes the treatment choices: Medical Management, Angioplasty, or Coronary Bypass Surgery for treating various heart conditions including Myocardial Infarction, Angina, Valve Disease, and Cardiac Arrhythmias.  Written material given at graduation. Flowsheet Row Cardiac Rehab from 12/03/2020 in [facility] Cardiac and Pulmonary Rehab  Education need identified 09/16/20  Date 10/15/20  Educator [facility]  Instruction Review Code 1- Verbalizes Understanding       Medication Safety: - Group verbal and visual instruction to review commonly prescribed medications for heart and lung  disease. Reviews the medication, class of the drug, and side effects. Includes the steps to properly store meds and maintain the prescription regimen.  Written material given at graduation. Flowsheet Row Cardiac Rehab from 12/03/2020 in [facility] Cardiac and Pulmonary Rehab  Date 11/05/20  Educator [facility]  Instruction Review Code 1- Verbalizes Understanding       Intimacy: - Group verbal instruction through game format to discuss how heart and lung disease can affect sexual intimacy. Written material given at graduation.. Flowsheet Row Cardiac Rehab from 12/03/2020 in [facility] Cardiac and Pulmonary Rehab  Date 10/08/20  Educator [place]  Instruction Review Code 1- Verbalizes Understanding       Know Your Numbers and Heart Failure: - Group verbal and visual instruction to discuss disease risk factors for cardiac and pulmonary disease and treatment options.  Reviews associated critical values for Overweight/Obesity, Hypertension, Cholesterol, and Diabetes.  Discusses basics of heart failure: signs/symptoms and treatments.  Introduces Heart Failure Zone chart for action plan for heart failure.  Written material given at graduation. Flowsheet Row Cardiac Rehab from 12/03/2020 in [facility] Cardiac and Pulmonary Rehab  Date 11/12/20  Educator [facility]  Instruction Review Code 1- Verbalizes Understanding       Infection Prevention: - Provides verbal and written material to individual with discussion of infection control including proper hand washing and proper equipment cleaning during exercise session. Flowsheet Row Cardiac Rehab from 12/03/2020 in [facility] Cardiac and Pulmonary Rehab  Date 09/10/20  Educator [facility]  Instruction Review Code 1- Verbalizes Understanding       Falls Prevention: - Provides verbal and written material to individual with discussion of falls prevention and safety. Flowsheet Row Cardiac Rehab from 12/03/2020 in [facility] Cardiac and Pulmonary Rehab  Date 09/10/20  Educator [facility]  Instruction Review  Code 1- Verbalizes Understanding       Other: -Provides group and verbal instruction on various topics (see comments)   Knowledge  Questionnaire Score:  Knowledge Questionnaire Score - 09/16/20 1524       Knowledge Questionnaire Score   Pre Score 19/26 Education Focus: Angina, nurtrition, exercise             Core Components/Risk Factors/Patient Goals at Admission:  Personal Goals and Risk Factors at Admission - 09/16/20 1524       Core Components/Risk Factors/Patient Goals on Admission    Weight Management Yes;Weight Loss    Intervention Weight Management: Develop a combined nutrition and exercise program designed to reach desired caloric intake, while maintaining appropriate intake of nutrient and fiber, sodium and fats, and appropriate energy expenditure required for the weight goal.;Weight Management: Provide education and appropriate resources to help participant work on and attain dietary goals.;Weight Management/Obesity: Establish reasonable short term and long term weight goals.    Admit Weight 192 lb (87.1 kg)    Goal Weight: Short Term 185 lb (83.9 kg)    Goal Weight: Long Term 180 lb (81.6 kg)    Expected Outcomes Short Term: Continue to assess and modify interventions until short term weight is achieved;Long Term: Adherence to nutrition and physical activity/exercise program aimed toward attainment of established weight goal;Weight Loss: Understanding of general recommendations for a balanced deficit meal plan, which promotes 1-2 lb weight loss per week and includes a negative energy balance of [phone] kcal/d;Understanding recommendations for meals to include 15-35% energy as protein, 25-35% energy from fat, 35-60% energy from carbohydrates, less than 210m of dietary cholesterol, 20-35 gm of total fiber daily;Understanding of distribution of calorie intake throughout the day with the consumption of 4-5 meals/snacks    Diabetes Yes    Intervention Provide education about  signs/symptoms and action to take for hypo/hyperglycemia.;Provide education about proper nutrition, including hydration, and aerobic/resistive exercise prescription along with prescribed medications to achieve blood glucose in normal ranges: Fasting glucose 65-99 mg/dL    Expected Outcomes Short Term: Participant verbalizes understanding of the signs/symptoms and immediate care of hyper/hypoglycemia, proper foot care and importance of medication, aerobic/resistive exercise and nutrition plan for blood glucose control.;Long Term: Attainment of HbA1C < 7%.    Hypertension Yes    Intervention Provide education on lifestyle modifcations including regular physical activity/exercise, weight management, moderate sodium restriction and increased consumption of fresh fruit, vegetables, and low fat dairy, alcohol moderation, and smoking cessation.;Monitor prescription use compliance.    Expected Outcomes Short Term: Continued assessment and intervention until BP is < 140/929mHG in hypertensive participants. < 130/803mG in hypertensive participants with diabetes, heart failure or chronic kidney disease.;Long Term: Maintenance of blood pressure at goal levels.    Lipids Yes    Intervention Provide education and support for participant on nutrition & aerobic/resistive exercise along with prescribed medications to achieve LDL <3m30mDL >40mg33m Expected Outcomes Short Term: Participant states understanding of desired cholesterol values and is compliant with medications prescribed. Participant is following exercise prescription and nutrition guidelines.;Long Term: Cholesterol controlled with medications as prescribed, with individualized exercise RX and with personalized nutrition plan. Value goals: LDL < 3mg,29m > 40 mg.             Education:Diabetes - Individual verbal and written instruction to review signs/symptoms of diabetes, desired ranges of glucose level fasting, after meals and with exercise.  Acknowledge that pre and post exercise glucose checks will be done for 3 sessions at entry of program. [place]  Date 09/10/20  Educator [facility]  Instruction Review Code 1- Verbalizes Understanding       Core Components/Risk Factors/Patient Goals Review:   Goals and Risk Factor Review     Row Name 10/27/20 1107 11/17/20 1112           Core Components/Risk Factors/Patient Goals Review   Personal Goals Review Weight Management/Obesity;Hypertension;Heart Failure Weight Management/Obesity;Hypertension;Heart Failure;Diabetes      Review Carlos Mathis wants to lose more weight. He was 186 pounds today. He was 192 pounds when he started the program. His weight goal is to be below 180 pounds. Carlos Mathis is continuing to lose weight - today 183.6lbs. He was 192 lbs when he started the program. He continues to come to rehab and work on his nutrition with his wife. He reports his BP is about 120/70, today was lower at 108/58, but he was feeling fine. He continues to check his BP every morning. BG 110-115 in the am. He is still taking his medication as directed.      Expected Outcomes Short: lose more weight. Long: reach weight goal of 180 pounds. Short: continue to check vitals at home Long: reach weight goal of 180 pounds.               Core Components/Risk Factors/Patient Goals at Discharge (Final Review):   Goals and Risk Factor Review - 11/17/20 1112       Core Components/Risk Factors/Patient Goals Review   Personal Goals Review Weight Management/Obesity;Hypertension;Heart Failure;Diabetes    Review Carlos Mathis is continuing to lose weight - today 183.6lbs. He was 192 lbs when he started the program. He continues to come to rehab and work on his nutrition with his wife. He reports his BP is about 120/70, today was lower at 108/58, but he was feeling fine. He continues to check his BP every morning. BG 110-115 in the am. He is still taking his  medication as directed.    Expected Outcomes Short: continue to check vitals at home Long: reach weight goal of 180 pounds.             ITP Comments:  ITP Comments     Row Name 09/10/20 1008 09/16/20 1514 09/17/20 1039 10/07/20 0901 11/04/20 5852   ITP Comments Virtual Visit completed. Patient informed on EP and RD appointment and 6 Minute walk test. Patient also informed of patient health questionnaires on My Chart. Patient Verbalizes understanding. Visit diagnosis can be found in [facility] 09/02/20. Completed 6MWT and gym orientation. Initial ITP created and sent for review to Dr. Emily Filbert, Medical Director. First full day of exercise!  Patient was oriented to gym and equipment including functions, settings, policies, and procedures.  Patient's individual exercise prescription and treatment plan were reviewed.  All starting workloads were established based on the results of the 6 minute walk test done at initial orientation visit.  The plan for exercise progression was also introduced and progression will be customized based on patient's performance and goals. 30 Day review completed. Medical Director ITP review done, changes made as directed, and signed approval by Medical Director.  New to program 30 Day review completed. Medical Director ITP review done, changes made as directed, and signed approval by Medical Director.   2 visits in May    Row Name 12/02/20 0641 12/03/20 1157 12/03/20 1202 12/29/20 1539 12/30/20 0619   ITP Comments 30 Day review completed. Medical Director ITP review done, changes made as directed, and signed approval by Medical Director. Timothee graduated today from  rehab with 35 sessions completed.  Details of the patient's exercise prescription and what He needs to do in order to continue the prescription and progress were discussed with patient.  Patient was given a copy of prescription and goals.  Patient verbalized understanding.  Simcha plans to continue to exercise by ride  exercise bike at home, swim and walk.. ERROR   Not graduating today Out for ED visits and COVID exposures 30 Day review completed. Medical Director ITP review done, changes made as directed, and signed approval by Medical Director.            Comments:

## 2021-01-12 ENCOUNTER — Encounter: Payer: Medicare Other | Attending: Cardiology

## 2021-01-12 ENCOUNTER — Other Ambulatory Visit: Payer: Self-pay

## 2021-01-12 DIAGNOSIS — Z48812 Encounter for surgical aftercare following surgery on the circulatory system: Secondary | ICD-10-CM | POA: Diagnosis not present

## 2021-01-12 DIAGNOSIS — Z955 Presence of coronary angioplasty implant and graft: Secondary | ICD-10-CM | POA: Insufficient documentation

## 2021-01-12 DIAGNOSIS — I252 Old myocardial infarction: Secondary | ICD-10-CM | POA: Insufficient documentation

## 2021-01-12 DIAGNOSIS — I213 ST elevation (STEMI) myocardial infarction of unspecified site: Secondary | ICD-10-CM

## 2021-01-12 NOTE — Progress Notes (Signed)
Daily Session Note  Patient Details  Name: Carlos Mathis MRN: [id number] Date of Birth: July 14, 1940 Referring Provider:   Flowsheet Row Cardiac Rehab from 09/16/2020 in [facility] Cardiac and Pulmonary Rehab  Referring Provider [name] MD       Encounter Date: 01/12/2021  Check In:  Session Check In - 01/12/21 1103       Check-In   Supervising physician immediately available to respond to emergencies See telemetry face sheet for immediately available ER MD    Location [facility]-Cardiac & Pulmonary Rehab    Staff Present Birdie Sons, MPA, RN;Amanda Sommer, BA, ACSM CEP, Exercise Physiologist;Jessica Luan Pulling, MA, RCEP, CCRP, CCET    Virtual Visit No    Medication changes reported     No    Fall or balance concerns reported    No    Warm-up and Cool-down Performed on first and last piece of equipment    Resistance Training Performed Yes    VAD Patient? No    PAD/SET Patient? No      Pain Assessment   Currently in Pain? No/denies                Social History   Tobacco Use  Smoking Status Former   Packs/day: 1.00   Years: 20.00   Pack years: 20.00   Types: Cigarettes   Quit date: 09/11/1983   Years since quitting: 37.3  Smokeless Tobacco Former   Types: Chew   Quit date: 09/11/1983    Goals Met:  Independence with exercise equipment Exercise tolerated well Personal goals reviewed No report of cardiac concerns or symptoms Strength training completed today  Goals Unmet:  Not Applicable  Comments: Pt able to follow exercise prescription today without complaint.  Will continue to monitor for progression.    Dr. Emily Filbert is Medical Director for [place].  Dr. Ottie Glazier is Medical Director for [facility] Pulmonary Rehabilitation.

## 2021-01-14 ENCOUNTER — Other Ambulatory Visit: Payer: Self-pay

## 2021-01-14 ENCOUNTER — Encounter: Payer: Medicare Other | Admitting: *Deleted

## 2021-01-14 DIAGNOSIS — Z48812 Encounter for surgical aftercare following surgery on the circulatory system: Secondary | ICD-10-CM | POA: Diagnosis not present

## 2021-01-14 DIAGNOSIS — I213 ST elevation (STEMI) myocardial infarction of unspecified site: Secondary | ICD-10-CM

## 2021-01-14 DIAGNOSIS — Z955 Presence of coronary angioplasty implant and graft: Secondary | ICD-10-CM

## 2021-01-14 NOTE — Progress Notes (Signed)
Daily Session Note  Patient Details  Name: Carlos Mathis MRN: [id number] Date of Birth: 1941/05/19 Referring Provider:   Flowsheet Row Cardiac Rehab from 09/16/2020 in [facility] Cardiac and Pulmonary Rehab  Referring Provider [name] MD       Encounter Date: 01/14/2021  Check In:  Session Check In - 01/14/21 1046       Check-In   Supervising physician immediately available to respond to emergencies See telemetry face sheet for immediately available ER MD    Location [facility]-Cardiac & Pulmonary Rehab    Staff Present Renita Papa, RN BSN;Joseph Tessie Fass, RCP,RRT,BSRT;Melissa [place], [place], LDN    Virtual Visit No    Medication changes reported     No    Warm-up and Cool-down Performed on first and last piece of equipment    Resistance Training Performed Yes    VAD Patient? No    PAD/SET Patient? No      Pain Assessment   Currently in Pain? No/denies                Social History   Tobacco Use  Smoking Status Former   Packs/day: 1.00   Years: 20.00   Pack years: 20.00   Types: Cigarettes   Quit date: 09/11/1983   Years since quitting: 37.3  Smokeless Tobacco Former   Types: Chew   Quit date: 09/11/1983    Goals Met:  Independence with exercise equipment Exercise tolerated well No report of cardiac concerns or symptoms Strength training completed today  Goals Unmet:  Not Applicable  Comments: Pt able to follow exercise prescription today without complaint.  Will continue to monitor for progression.    Dr. Emily Filbert is Medical Director for [place].  Dr. Ottie Glazier is Medical Director for [facility] Pulmonary Rehabilitation.

## 2021-01-19 ENCOUNTER — Encounter: Payer: Medicare Other | Attending: Cardiology | Admitting: *Deleted

## 2021-01-19 ENCOUNTER — Other Ambulatory Visit: Payer: Self-pay

## 2021-01-19 DIAGNOSIS — I213 ST elevation (STEMI) myocardial infarction of unspecified site: Secondary | ICD-10-CM | POA: Insufficient documentation

## 2021-01-19 DIAGNOSIS — Z955 Presence of coronary angioplasty implant and graft: Secondary | ICD-10-CM | POA: Insufficient documentation

## 2021-01-19 NOTE — Progress Notes (Signed)
Daily Session Note  Patient Details  Name: Carlos Mathis MRN: [id number] Date of Birth: 26-Jan-1941 Referring Provider:   Flowsheet Row Cardiac Rehab from 09/16/2020 in [facility]  Referring Provider Carlos Cowman MD       Encounter Date: 01/19/2021  Check In:  Session Check In - 01/19/21 1146       Check-In   Supervising physician immediately available to respond to emergencies See telemetry face sheet for immediately available ER MD    Location [facility]-Cardiac & Pulmonary Rehab    Staff Present Carlos Lark, RN, BSN, CCRP;Carlos Mathis, MPA, RN;Carlos Mathis, BA, ACSM CEP, Exercise Physiologist;Carlos Mathis, RDN, LDN    Virtual Visit No    Medication changes reported     No    Fall or balance concerns reported    No    Warm-up and Cool-down Performed on first and last piece of equipment    Resistance Training Performed Yes    VAD Patient? No    PAD/SET Patient? No      Pain Assessment   Currently in Pain? No/denies                Social History   Tobacco Use  Smoking Status Former   Packs/day: 1.00   Years: 20.00   Pack years: 20.00   Types: Cigarettes   Quit date: 09/11/1983   Years since quitting: 37.3  Smokeless Tobacco Former   Types: Chew   Quit date: 09/11/1983    Goals Met:  Independence with exercise equipment Exercise tolerated well No report of cardiac concerns or symptoms  Goals Unmet:  Not Applicable  Comment   Carlos Mathis graduated today from  rehab with 36 sessions completed.  Details of the patient's exercise prescription and what He needs to do in order to continue the prescription and progress were discussed with patient.  Patient was given a copy of prescription and goals.  Patient verbalized understanding.  Carlos Mathis plans to continue to exercise by joining the [employer].      Dr. Emily Mathis is Medical Director for [facility].  Dr. Ottie Mathis is Medical Director for [facility]  Pulmonary Rehabilitation.

## 2021-01-19 NOTE — Progress Notes (Signed)
Cardiac Individual Treatment Plan  Patient Details  Name: Carlos Mathis MRN: [id number] Date of Birth: [birth year] Referring Provider:   Flowsheet Row Cardiac Rehab from 09/16/2020 in [facility] Cardiac and Pulmonary Rehab  Referring Provider [name] MD       Initial Encounter Date:  Flowsheet Row Cardiac Rehab from 09/16/2020 in [facility] Cardiac and Pulmonary Rehab  Date 09/16/20       Visit Diagnosis: ST elevation myocardial infarction (STEMI), unspecified artery ([place])  Status post coronary artery stent placement  Patient's Home Medications on Admission:  Current Outpatient Medications:    aspirin 81 MG EC tablet, Take by mouth., Disp: , Rfl:    atorvastatin (LIPITOR) 40 MG tablet, Take 1 tablet by mouth 2 (two) times daily., Disp: , Rfl:    glipiZIDE (GLUCOTROL) 10 MG tablet, Take 1 tablet by mouth 2 (two) times daily., Disp: , Rfl:    glucose blood ([employer] BLOOD GLUCOSE TEST) test strip, USE 1 STRIP FOR TESTING AS DIRECTED EVERY DAY TO TEST BLOOD SUGAR, Disp: , Rfl:    glucose blood test strip, USE 1 STRIP FOR TESTING AS DIRECTED EVERY DAY TO TEST BLOOD SUGAR, Disp: , Rfl:    losartan (COZAAR) 25 MG tablet, Take 1 tablet by mouth daily., Disp: , Rfl:    metFORMIN (GLUMETZA) 500 MG (MOD) 24 hr tablet, Take 1 tablet by mouth daily., Disp: , Rfl:    metoprolol succinate (TOPROL-XL) 100 MG 24 hr tablet, 25 mg 1 day or 1 dose., Disp: , Rfl:    Multiple Vitamin (MULTIVITAMIN) capsule, Take 1 capsule by mouth daily., Disp: , Rfl:    nitroGLYCERIN (NITROSTAT) 0.4 MG SL tablet, Place under the tongue., Disp: , Rfl:    Omega-3 Fatty Acids (OMEGA-3 FISH OIL) 1200 MG CAPS, Take 1 tablet by mouth daily., Disp: , Rfl:    ranolazine (RANEXA) 500 MG 12 hr tablet, Take 1 tablet by mouth 2 (two) times daily., Disp: , Rfl:    Red Yeast Rice Extract (RED YEAST RICE PO), Take 1 tablet by mouth 2 (two) times daily., Disp: , Rfl:    ticagrelor (BRILINTA) 90 MG TABS tablet, Take 1 tablet by mouth  2 (two) times daily., Disp: , Rfl:    Valerian Root 450 MG CAPS, Take by mouth., Disp: , Rfl:   Past Medical History: Past Medical History:  Diagnosis Date   Hypertension    MI (myocardial infarction) ([place])     Tobacco Use: Social History   Tobacco Use  Smoking Status Former   Packs/day: 1.00   Years: 20.00   Pack years: 20.00   Types: Cigarettes   Quit date: 09/11/1983   Years since quitting: 37.3  Smokeless Tobacco Former   Types: Chew   Quit date: 09/11/1983    Labs: Recent Review Flowsheet Data   There is no flowsheet data to display.      Exercise Target Goals: Exercise Program Goal: Individual exercise prescription set using results from initial 6 min walk test and THRR while considering  patient's activity barriers and safety.   Exercise Prescription Goal: Initial exercise prescription builds to 30-45 minutes a day of aerobic activity, 2-3 days per week.  Home exercise guidelines will be given to patient during program as part of exercise prescription that the participant will acknowledge.   Education: Aerobic Exercise: - Group verbal and visual presentation on the components of exercise prescription. Introduces F.I.T.T principle from ACSM for exercise prescriptions.  Reviews F.I.T.T. principles of aerobic exercise including progression. Written material given  at graduation. Flowsheet Row Cardiac Rehab from 01/14/2021 in [facility] Cardiac and Pulmonary Rehab  Education need identified 09/16/20  Date 10/08/20  Educator [place]  Instruction Review Code 1- Verbalizes Understanding       Education: Resistance Exercise: - Group verbal and visual presentation on the components of exercise prescription. Introduces F.I.T.T principle from ACSM for exercise prescriptions  Reviews F.I.T.T. principles of resistance exercise including progression. Written material given at graduation. Flowsheet Row Cardiac Rehab from 01/14/2021 in [facility] Cardiac and Pulmonary Rehab  Date 10/15/20   Educator [facility]  Instruction Review Code 1- Verbalizes Understanding        Education: Exercise & Equipment Safety: - Individual verbal instruction and demonstration of equipment use and safety with use of the equipment. Flowsheet Row Cardiac Rehab from 01/14/2021 in [facility] Cardiac and Pulmonary Rehab  Date 09/10/20  Educator [facility]  Instruction Review Code 1- Verbalizes Understanding       Education: Exercise Physiology & General Exercise Guidelines: - Group verbal and written instruction with models to review the exercise physiology of the cardiovascular system and associated critical values. Provides general exercise guidelines with specific guidelines to those with heart or lung disease.  Flowsheet Row Cardiac Rehab from 01/14/2021 in [facility] Cardiac and Pulmonary Rehab  Date 12/03/20  Educator [place]  Instruction Review Code 1- Verbalizes Understanding       Education: Flexibility, Balance, Mind/Body Relaxation: - Group verbal and visual presentation with interactive activity on the components of exercise prescription. Introduces F.I.T.T principle from ACSM for exercise prescriptions. Reviews F.I.T.T. principles of flexibility and balance exercise training including progression. Also discusses the mind body connection.  Reviews various relaxation techniques to help reduce and manage stress (i.e. Deep breathing, progressive muscle relaxation, and visualization). Balance handout provided to take home. Written material given at graduation.   Activity Barriers & Risk Stratification:  Activity Barriers & Cardiac Risk Stratification - 09/16/20 1520       Activity Barriers & Cardiac Risk Stratification   Activity Barriers Right Knee Replacement;Arthritis;Joint Problems;Balance Concerns;History of Falls;Muscular Weakness;Shortness of Breath;Deconditioning;Back Problems   arth in back, bilateral knee pain   Cardiac Risk Stratification Moderate             6 Minute Walk:  6 Minute Walk      Row Name 09/16/20 1514 12/08/20 1232       6 Minute Walk   Phase Initial Discharge    Distance 1165 feet 1200 feet    Distance % Change -- 3 %    Distance Feet Change -- 35 ft    Walk Time 6 minutes 6 minutes    # of Rest Breaks 0 0    MPH 2.21 2.27    METS 2.45 1.93    RPE 13 13    Perceived Dyspnea  -- 0    VO2 Peak 8.57 6.75    Symptoms Yes (comment) No    Comments shuffling feet, felt stumbly at end --    Resting HR 80 bpm 68 bpm    Resting BP 154/62 114/56    Resting Oxygen Saturation  99 % 95 %    Exercise Oxygen Saturation  during 6 min walk 99 % 97 %    Max Ex. HR 118 bpm 76 bpm    Max Ex. BP 148/74 108/58    2 Minute Post BP 112/62 --             Oxygen Initial Assessment:   Oxygen Re-Evaluation:   Oxygen Discharge (Final Oxygen  Re-Evaluation):   Initial Exercise Prescription:  Initial Exercise Prescription - 09/16/20 1500       Date of Initial Exercise RX and Referring Provider   Date 09/16/20    Referring Provider Paraschos, Alexander MD      Treadmill   MPH 1.9    Grade 0.5    Minutes 15    METs 2.59      REL-XR   Level 1    Speed 50    Minutes 15    METs 2.5      T5 Nustep   Level 1    SPM 80    Minutes 15    METs 2.5      Prescription Details   Frequency (times per week) 2    Duration Progress to 30 minutes of continuous aerobic without signs/symptoms of physical distress      Intensity   THRR 40-80% of Max Heartrate 104-129    Ratings of Perceived Exertion 11-13    Perceived Dyspnea 0-4      Progression   Progression Continue to progress workloads to maintain intensity without signs/symptoms of physical distress.      Resistance Training   Training Prescription Yes    Weight 3 lb    Reps 10-15             Perform Capillary Blood Glucose checks as needed.  Exercise Prescription Changes:   Exercise Prescription Changes     Row Name 09/16/20 1500 09/21/20 0900 10/07/20 1300 10/20/20 1300 11/03/20 1500      Response to Exercise   Blood Pressure (Admit) 154/62 124/74 110/60 104/60 106/60   Blood Pressure (Exercise) 148/74 116/64 118/62 136/74 122/60   Blood Pressure (Exit) 112/62 108/64 98/60 124/74 112/62   Heart Rate (Admit) 80 bpm 73 bpm 80 bpm 69 bpm 85 bpm   Heart Rate (Exercise) 118 bpm 110 bpm 120 bpm 84 bpm 102 bpm   Heart Rate (Exit) 90 bpm 75 bpm 88 bpm 78 bpm 82 bpm   Oxygen Saturation (Admit) 99 % -- -- -- 96 %   Oxygen Saturation (Exercise) 99 % -- -- -- 95 %   Oxygen Saturation (Exit) -- -- -- -- 96 %   Rating of Perceived Exertion (Exercise) 13 15 12 12 13    Perceived Dyspnea (Exercise) -- -- -- -- 0   Symptoms shuffling feet, felt stumbly at end -- -- -- none   Comments walk test results first day -- -- --   Duration -- -- Continue with 30 min of aerobic exercise without signs/symptoms of physical distress. Continue with 30 min of aerobic exercise without signs/symptoms of physical distress. Continue with 30 min of aerobic exercise without signs/symptoms of physical distress.   Intensity -- -- THRR unchanged THRR unchanged THRR unchanged     Progression   Progression -- Continue to progress workloads to maintain intensity without signs/symptoms of physical distress. Continue to progress workloads to maintain intensity without signs/symptoms of physical distress. Continue to progress workloads to maintain intensity without signs/symptoms of physical distress. Continue to progress workloads to maintain intensity without signs/symptoms of physical distress.   Average METs -- 2.59 2.75 2.6 3.4     Resistance Training   Training Prescription -- Yes Yes Yes Yes   Weight -- 3 lb 4 lb 4 lb 4 lb   Reps -- 10-15 10-15 10-15 10-15     Interval Training   Interval Training -- -- -- -- No     Treadmill  MPH -- 1.9 1.9 -- --   Grade -- 0.5 0.5 -- --   Minutes -- 15 15 -- --   METs -- 2.59 2.59 -- --     Recumbant Bike   Level -- -- 1 3 2    Minutes -- -- 15 15 15    METs -- -- 2.9  2.93 3     NuStep   Level -- -- -- 5 6   Minutes -- -- -- 15 15   METs -- -- -- 2.3 3.8     T5 Nustep   Level -- 1 -- -- --   SPM -- 80 -- -- --   Minutes -- 15 -- -- --    Row Name 11/18/20 1500 12/02/20 1000 12/15/20 1400 01/12/21 1300       Response to Exercise   Blood Pressure (Admit) 108/58 102/60 106/60 134/70    Blood Pressure (Exercise) 136/64 124/64 -- --    Blood Pressure (Exit) 122/64 110/70 122/62 114/58    Heart Rate (Admit) 81 bpm 72 bpm 72 bpm 64 bpm    Heart Rate (Exercise) 100 bpm 94 bpm 83 bpm 98 bpm    Heart Rate (Exit) 78 bpm 85 bpm 71 bpm 75 bpm    Oxygen Saturation (Admit) 96 % 97 % 96 % 96 %    Oxygen Saturation (Exercise) 95 % 96 % 95 % 97 %    Oxygen Saturation (Exit) 95 % 98 % 98 % 95 %    Rating of Perceived Exertion (Exercise) 13 13 12 12     Symptoms none none none none    Duration Continue with 30 min of aerobic exercise without signs/symptoms of physical distress. Continue with 30 min of aerobic exercise without signs/symptoms of physical distress. Continue with 30 min of aerobic exercise without signs/symptoms of physical distress. Continue with 30 min of aerobic exercise without signs/symptoms of physical distress.    Intensity THRR unchanged THRR unchanged THRR unchanged THRR unchanged         Progression        Progression Continue to progress workloads to maintain intensity without signs/symptoms of physical distress. Continue to progress workloads to maintain intensity without signs/symptoms of physical distress. Continue to progress workloads to maintain intensity without signs/symptoms of physical distress. Continue to progress workloads to maintain intensity without signs/symptoms of physical distress.    Average METs 2.8 2.77 2.5 2.9         Resistance Training        Training Prescription Yes Yes Yes Yes    Weight 4 lb 4 lb 4 lb 4 lb    Reps 10-15 10-15 10-15 10-15         Interval Training        Interval Training No No No No          Treadmill        MPH 2 2 -- 2    Grade 0.5 0.5 -- 0.5    Minutes 15 15 -- 15    METs 2.67 2.67 -- 2.67         Recumbant Bike        Level 8 9 -- 9    RPM -- 60 -- --    Minutes 15 15 -- 15    METs 2.94 2.94 -- 2.75         NuStep        Level -- 6 6 --    SPM -- 80 -- --  Minutes -- 15 15 --    METs -- 2.8 2 --         Biostep-RELP        Level -- -- 1 --    Minutes -- -- 15 --            Exercise Comments:   Exercise Comments     Row Name 12/03/20 1157 01/19/21 1148         Exercise Comments Error NOT graduating today Jeneen Rinks graduated today from  rehab with 36 sessions completed.  Details of the patient's exercise prescription and what He needs to do in order to continue the prescription and progress were discussed with patient.  Patient was given a copy of prescription and goals.  Patient verbalized understanding.  Al plans to continue to exercise by joining the [employer].               Exercise Goals and Review:   Exercise Goals     Row Name 09/16/20 1522             Exercise Goals   Increase Physical Activity Yes       Intervention Provide advice, education, support and counseling about physical activity/exercise needs.;Develop an individualized exercise prescription for aerobic and resistive training based on initial evaluation findings, risk stratification, comorbidities and participant's personal goals.       Expected Outcomes Short Term: Attend rehab on a regular basis to increase amount of physical activity.;Long Term: Add in home exercise to make exercise part of routine and to increase amount of physical activity.;Long Term: Exercising regularly at least 3-5 days a week.       Increase Strength and Stamina Yes       Intervention Provide advice, education, support and counseling about physical activity/exercise needs.;Develop an individualized exercise prescription for aerobic and resistive training based on initial evaluation findings,  risk stratification, comorbidities and participant's personal goals.       Expected Outcomes Short Term: Increase workloads from initial exercise prescription for resistance, speed, and METs.;Short Term: Perform resistance training exercises routinely during rehab and add in resistance training at home;Long Term: Improve cardiorespiratory fitness, muscular endurance and strength as measured by increased METs and functional capacity (6MWT)       Able to understand and use rate of perceived exertion (RPE) scale Yes       Intervention Provide education and explanation on how to use RPE scale       Expected Outcomes Short Term: Able to use RPE daily in rehab to express subjective intensity level;Long Term:  Able to use RPE to guide intensity level when exercising independently       Able to understand and use Dyspnea scale Yes       Intervention Provide education and explanation on how to use Dyspnea scale       Expected Outcomes Short Term: Able to use Dyspnea scale daily in rehab to express subjective sense of shortness of breath during exertion;Long Term: Able to use Dyspnea scale to guide intensity level when exercising independently       Knowledge and understanding of Target Heart Rate Range (THRR) Yes       Intervention Provide education and explanation of THRR including how the numbers were predicted and where they are located for reference       Expected Outcomes Short Term: Able to state/look up THRR;Long Term: Able to use THRR to govern intensity when exercising independently;Short Term: Able to use daily as guideline  for intensity in rehab       Able to check pulse independently Yes       Intervention Provide education and demonstration on how to check pulse in carotid and radial arteries.;Review the importance of being able to check your own pulse for safety during independent exercise       Expected Outcomes Short Term: Able to explain why pulse checking is important during independent  exercise;Long Term: Able to check pulse independently and accurately       Understanding of Exercise Prescription Yes       Intervention Provide education, explanation, and written materials on patient's individual exercise prescription       Expected Outcomes Short Term: Able to explain program exercise prescription;Long Term: Able to explain home exercise prescription to exercise independently                Exercise Goals Re-Evaluation :  Exercise Goals Re-Evaluation     Row Name 09/17/20 1039 10/07/20 1332 10/20/20 1310 11/03/20 1539 11/17/20 1116     Exercise Goal Re-Evaluation   Exercise Goals Review Increase Physical Activity;Able to understand and use rate of perceived exertion (RPE) scale;Knowledge and understanding of Target Heart Rate Range (THRR);Understanding of Exercise Prescription;Increase Strength and Stamina;Able to check pulse independently Increase Physical Activity;Increase Strength and Stamina Increase Physical Activity;Increase Strength and Stamina Increase Physical Activity;Increase Strength and Stamina;Understanding of Exercise Prescription Increase Physical Activity;Increase Strength and Stamina;Understanding of Exercise Prescription   Comments Reviewed RPE and dyspnea scales, THR and program prescription with pt today.  Pt voiced understanding and was given a copy of goals to take home. Carlos Mathis is tolerating exercise well and reaches his THR range.  He has worked up to prescribed speed on TM.  We will continue to monitor progress. Carlos Mathis attends consistently.  Staff will encourage trying 5 lb for strength work. Carlos Mathis is doing well in rehab.  He is now on level 6 for the NuStep.  We will continue to montior his progress. Carlos Mathis is attending rehab consistently. He reports it is hard to exercise at home as his equipment is upstairs. Discussed other options such as walking outside when it is nice and going early in the am before it gets too hot.   Expected Outcomes Short: Use RPE daily  to regulate intensity. Long: Follow program prescription in THR. Short:  continue to follow program prescription Long: improve overall stamina Short: maintain consistent attendance Long:  increase MET level Short: Try to increase handweights Long: Continue to improve stamina Short: Walk at home outside - in am when very hot. Long: Continue to improve stamina    Row Name 12/02/20 1055 12/15/20 1404 12/29/20 1539 01/12/21 1100       Exercise Goal Re-Evaluation   Exercise Goals Review Increase Physical Activity;Increase Strength and Stamina;Understanding of Exercise Prescription -- -- Increase Physical Activity;Increase Strength and Stamina;Understanding of Exercise Prescription    Comments Carlos Mathis is doing well in rehab. He is now up to level 9 on the Recumbant bike and works in appropriate RPE range. Will continue to monitor. Carlos Mathis will complete HT in 4 sessions. Out for ED visits and COVID exposures Carlos Mathis was out with COVID.  He has been walking a mile twice a day to rebuild his strength. He had spent 5 days in bed.  But now he is feeling better and glad to be back.  He plans to keep walking after graduation    Expected Outcomes Short: Continue to increase loads with exercise Long: Improve overall  MET level Short:complete HT long:  maintain exercie on his own -- Short: Graduate Long: Continue to exercise independently             Discharge Exercise Prescription (Final Exercise Prescription Changes):  Exercise Prescription Changes - 01/12/21 1300       Response to Exercise   Blood Pressure (Admit) 134/70    Blood Pressure (Exit) 114/58    Heart Rate (Admit) 64 bpm    Heart Rate (Exercise) 98 bpm    Heart Rate (Exit) 75 bpm    Oxygen Saturation (Admit) 96 %    Oxygen Saturation (Exercise) 97 %    Oxygen Saturation (Exit) 95 %    Rating of Perceived Exertion (Exercise) 12    Symptoms none    Duration Continue with 30 min of aerobic exercise without signs/symptoms of physical distress.     Intensity THRR unchanged      Progression   Progression Continue to progress workloads to maintain intensity without signs/symptoms of physical distress.    Average METs 2.9      Resistance Training   Training Prescription Yes    Weight 4 lb    Reps 10-15      Interval Training   Interval Training No      Treadmill   MPH 2    Grade 0.5    Minutes 15    METs 2.67      Recumbant Bike   Level 9    Minutes 15    METs 2.75             Nutrition:  Target Goals: Understanding of nutrition guidelines, daily intake of sodium <1541m, cholesterol <2054m calories 30% from fat and 7% or less from saturated fats, daily to have 5 or more servings of fruits and vegetables.  Education: All About Nutrition: -Group instruction provided by verbal, written material, interactive activities, discussions, models, and posters to present general guidelines for heart healthy nutrition including fat, fiber, MyPlate, the role of sodium in heart healthy nutrition, utilization of the nutrition label, and utilization of this knowledge for meal planning. Follow up email sent as well. Written material given at graduation. [facility]  Education need identified 09/16/20  Date 10/29/20  Educator MCAstoriaInstruction Review Code 1- Verbalizes Understanding       Biometrics:  Pre Biometrics - 09/16/20 1523       Pre Biometrics   Height 5' 8.7" (1.745 m)    Weight 192 lb (87.1 kg)    BMI (Calculated) 28.6    Single Leg Stand 6 seconds              Nutrition Therapy Plan and Nutrition Goals:  Nutrition Therapy & Goals - 09/29/20 1031       Nutrition Therapy   Diet Heart healthy, low Na, diabetes friendly    Drug/Food Interactions Statins/Certain Fruits    Protein (specify units) 70g    Fiber 30 grams    Whole Grain Foods 3 servings    Saturated Fats 12 max. grams    Fruits and Vegetables 8 servings/day    Sodium 1.5  grams      Personal Nutrition Goals   Nutrition Goal ST: try banana "nice cream" LT: continue with heart healthy changes    Comments 7.7 A1C. 7-8am BG: 115-140 (140 due to eating out). Eats out every once in a while. Uses monkfruit and stevia. Wife hardly ever puts salt in.  Breakfast (4-1DQ): bacon 1 slice every other day, 1 egg - mushrooms, low fat and low Na cheese. (coffee - creamer (no sugar)). S: Tangerine or banana Lunch (12-12:30pm): has to have lunch at this time due to downward spiral. Low Na tuna for tuna salad with some mayo and relish with solo bread (3g net carbs) two gerkins pickles. Tortilla chips (low Na, low saturated fat) with salsa. S: peanut butter and apple or cheese or carrots/celery/broccoli or cheetos if he is having a bad day. Dinner: meat (pork loin, hamburger 97% lean, chicken - dark meat, and fish) and vegetables (broccoli, asparagus, cabbage, greens like collards, turnip roots, he does not like cook tomatoes, some potatoes, green beans, spaghetti squash, makes her own tomato sauce, zucchini tots, onions, cook beans every week. Limits red meat, uses olive oil and avocado oil, uses lots of herbs and mrs dash, they like salmon and tilapia. They also have jello and whipped cream (small portion) with dinner. Discussed heart healthy and diabetes friendly eating.      Intervention Plan   Intervention Prescribe, educate and counsel regarding individualized specific dietary modifications aiming towards targeted core components such as weight, hypertension, lipid management, diabetes, heart failure and other comorbidities.;Nutrition handout(s) given to patient.    Expected Outcomes Short Term Goal: Understand basic principles of dietary content, such as calories, fat, sodium, cholesterol and nutrients.;Short Term Goal: A plan has been developed with personal nutrition goals set during dietitian appointment.;Long Term Goal: Adherence to prescribed nutrition plan.              Nutrition Assessments:  MEDIFICTS Score Key: ?70 Need to make dietary changes  40-70 Heart Healthy Diet ? 40 Therapeutic Level Cholesterol Diet  Flowsheet Row Cardiac Rehab from 09/16/2020 in [facility] Cardiac and Pulmonary Rehab  Picture Your Plate Total Score on Admission 80      Picture Your Plate Scores: <22 Unhealthy dietary pattern with much room for improvement. 41-50 Dietary pattern unlikely to meet recommendations for good health and room for improvement. 51-60 More healthful dietary pattern, with some room for improvement.  >60 Healthy dietary pattern, although there may be some specific behaviors that could be improved.    Nutrition Goals Re-Evaluation:  Nutrition Goals Re-Evaluation     [place] Name 10/27/20 1109 11/17/20 1110           Goals   Current Weight 186 lb (84.4 kg) --      Nutrition Goal Eat lower sodium. Stay away from unhealthy foods. ST:LT: continue with healthy eating wife has him doing      Comment Carlos Mathis wife has been maintaining his diet and he feels like he eats well. His diabetes is under control and has no issues with his sugar. He states he is drinking enough water. Carlos Mathis report wife is "keeping him in line" with his nutrition. He reports no changes and reports wanting to continuing what he is doing.      Expected Outcome Short: continue heart healthy diet. Long: maintain his sugars and diet independently. ST:LT: continue with healthy eating wife has him doing               Nutrition Goals Discharge (Final Nutrition Goals Re-Evaluation):  Nutrition Goals Re-Evaluation - 11/17/20 1110       Goals   Nutrition Goal ST:LT: continue with healthy eating wife has him doing    Comment Carlos Mathis report wife is "keeping him in line" with his nutrition. He reports no changes and reports wanting to continuing  what he is doing.    Expected Outcome ST:LT: continue with healthy eating wife has him doing             Psychosocial: Target Goals: Acknowledge  presence or absence of significant depression and/or stress, maximize coping skills, provide positive support system. Participant is able to verbalize types and ability to use techniques and skills needed for reducing stress and depression.   Education: Stress, Anxiety, and Depression - Group verbal and visual presentation to define topics covered.  Reviews how body is impacted by stress, anxiety, and depression.  Also discusses healthy ways to reduce stress and to treat/manage anxiety and depression.  Written material given at graduation. Flowsheet Row Cardiac Rehab from 01/14/2021 in [facility] Cardiac and Pulmonary Rehab  Date 11/26/20  Educator [facility]  Instruction Review Code 1- [employer] Understanding       Education: Sleep Hygiene -Provides group verbal and written instruction about how sleep can affect your health.  Define sleep hygiene, discuss sleep cycles and impact of sleep habits. Review good sleep hygiene tips.    Initial Review & Psychosocial Screening:  Initial Psych Review & Screening - 09/10/20 1011       Initial Review   Current issues with Current Stress Concerns    Comments The just moved from [place] and are in a house that needs a bit of work and is stressing him out.      Family Dynamics   Good Support System? Yes    Comments He has his wfe and God for support. They wanted to move back to his home state and his wife enjoyed it here .He has a positive outlook on his health.      Barriers   Psychosocial barriers to participate in program The patient should benefit from training in stress management and relaxation.      Screening Interventions   Interventions To provide support and resources with identified psychosocial needs;Provide feedback about the scores to participant;Encouraged to exercise    Expected Outcomes Short Term goal: Utilizing psychosocial counselor, staff and physician to assist with identification of specific Stressors or current issues interfering with  healing process. Setting desired goal for each stressor or current issue identified.;Long Term Goal: Stressors or current issues are controlled or eliminated.;Short Term goal: Identification and review with participant of any Quality of Life or Depression concerns found by scoring the questionnaire.;Long Term goal: The participant improves quality of Life and PHQ9 Scores as seen by post scores and/or verbalization of changes             Quality of Life Scores:   Quality of Life - 09/16/20 1523       Quality of Life   Select Quality of Life      Quality of Life Scores   Health/Function Pre 24.33 %    Socioeconomic Pre 23.75 %    Psych/Spiritual Pre 29.64 %    Family Pre 26.4 %    GLOBAL Pre 25.56 %            Scores of 19 and below usually indicate a poorer quality of life in these areas.  A difference of  2-3 points is a clinically meaningful difference.  A difference of 2-3 points in the total score of the Quality of Life Index has been associated with significant improvement in overall quality of life, self-image, physical symptoms, and general health in studies assessing change in quality of life.  PHQ-9: Recent Review Flowsheet Data     Depression  screen [facility] 2/9 10/27/2020 09/16/2020   Decreased Interest 0 1   Down, Depressed, Hopeless 0 1   PHQ - 2 Score 0 2   Altered sleeping 0 0   Tired, decreased energy 0 1   Change in appetite 1 1   Feeling bad or failure about yourself  0 0   Trouble concentrating 3 1   Moving slowly or fidgety/restless 0 1   Suicidal thoughts 0 0   PHQ-9 Score 4 6   Difficult doing work/chores Not difficult at all Not difficult at all      Interpretation of Total Score  Total Score Depression Severity:  1-4 = Minimal depression, 5-9 = Mild depression, 10-14 = Moderate depression, 15-19 = Moderately severe depression, 20-27 = Severe depression   Psychosocial Evaluation and Intervention:  Psychosocial Evaluation - 09/10/20 1016        Psychosocial Evaluation & Interventions   Interventions Encouraged to exercise with the program and follow exercise prescription;Relaxation education;Stress management education    Comments The just moved from [place] and are in a house that needs a bit of work and is stressing him out. He has his wfe and God for support. They wanted to move back to his home state and his wife enjoyed it here .He has a positive outlook on his health.    Expected Outcomes Short: Exercise regularly to support mental health and notify staff of any changes. Long: maintain mental health and well being through teaching of rehab or prescribed medications independently.    Continue Psychosocial Services  Follow up required by staff             Psychosocial Re-Evaluation:  Psychosocial Re-Evaluation     [place] Name 10/27/20 1116 11/17/20 1119 01/12/21 1102         Psychosocial Re-Evaluation   Current issues with None Identified Current Stress Concerns Current Stress Concerns     Comments Reviewed patient health questionnaire (PHQ-9) with patient for follow up. Previously, patients score indicated signs/symptoms of depression.  Reviewed to see if patient is improving symptom wise while in program.  Score improved and patient states that it is because he has been able to have more energy and exercise.Marland Kitchen His foundation of his home is messed up and they are getting it fixed which was over $3000. He reports otherwise doing well aside from some back pain - he went to [place] for back pain - they think it is muscular. He likes to ride in his car to help relax. He has a good support system in his wife as well as his sister, but she is going through stress with her husbands health. Carlos Mathis returned today after being out with COVID. He did not like being laid up for five days feeling bad. His finances are doing better and he feels good overall.  He gets out to walk to help manage     Expected Outcomes Short: Continue to attend [facility]  regularly for regular exercise and social engagement. Long: Continue to improve symptoms and manage a positive mental state. Short: Continue to attend [facility] regularly for regular exercise and social engagement, follow up with doctor about back. Long: Continue to improve symptoms and manage a positive mental state. Continue to exercise for mental boost.     Interventions Encouraged to attend Cardiac Rehabilitation for the exercise Encouraged to attend Cardiac Rehabilitation for the exercise Encouraged to attend Cardiac Rehabilitation for the exercise     Continue Psychosocial Services  No Follow up required  No Follow up required Follow up required by staff           Initial Review       Source of Stress Concerns -- Financial --             Psychosocial Discharge (Final Psychosocial Re-Evaluation):  Psychosocial Re-Evaluation - 01/12/21 1102       Psychosocial Re-Evaluation   Current issues with Current Stress Concerns    Comments Carlos Mathis returned today after being out with COVID. He did not like being laid up for five days feeling bad. His finances are doing better and he feels good overall.  He gets out to walk to help manage    Expected Outcomes Continue to exercise for mental boost.    Interventions Encouraged to attend Cardiac Rehabilitation for the exercise    Continue Psychosocial Services  Follow up required by staff             Vocational Rehabilitation: Provide vocational rehab assistance to qualifying candidates.   Vocational Rehab Evaluation & Intervention:   Education: Education Goals: Education classes will be provided on a variety of topics geared toward better understanding of heart health and risk factor modification. Participant will state understanding/return demonstration of topics presented as noted by education test scores.  Learning Barriers/Preferences:  Learning Barriers/Preferences - 09/10/20 1009       Learning Barriers/Preferences   Learning  Barriers None    Learning Preferences None             General Cardiac Education Topics:  AED/CPR: - Group verbal and written instruction with the use of models to demonstrate the basic use of the AED with the basic ABC's of resuscitation.   Anatomy and Cardiac Procedures: - Group verbal and visual presentation and models provide information about basic cardiac anatomy and function. Reviews the testing methods done to diagnose heart disease and the outcomes of the test results. Describes the treatment choices: Medical Management, Angioplasty, or Coronary Bypass Surgery for treating various heart conditions including Myocardial Infarction, Angina, Valve Disease, and Cardiac Arrhythmias.  Written material given at graduation. Flowsheet Row Cardiac Rehab from 01/14/2021 in [facility] Cardiac and Pulmonary Rehab  Education need identified 09/16/20  Date 10/15/20  Educator [facility]  Instruction Review Code 1- Verbalizes Understanding       Medication Safety: - Group verbal and visual instruction to review commonly prescribed medications for heart and lung disease. Reviews the medication, class of the drug, and side effects. Includes the steps to properly store meds and maintain the prescription regimen.  Written material given at graduation. Flowsheet Row Cardiac Rehab from 01/14/2021 in [facility] Cardiac and Pulmonary Rehab  Date 11/05/20  Educator [facility]  Instruction Review Code 1- Verbalizes Understanding       Intimacy: - Group verbal instruction through game format to discuss how heart and lung disease can affect sexual intimacy. Written material given at graduation.. Flowsheet Row Cardiac Rehab from 01/14/2021 in [facility] Cardiac and Pulmonary Rehab  Date 10/08/20  Educator [place]  Instruction Review Code 1- Verbalizes Understanding       Know Your Numbers and Heart Failure: - Group verbal and visual instruction to discuss disease risk factors for cardiac and pulmonary disease and treatment options.   Reviews associated critical values for Overweight/Obesity, Hypertension, Cholesterol, and Diabetes.  Discusses basics of heart failure: signs/symptoms and treatments.  Introduces Heart Failure Zone chart for action plan for heart failure.  Written material given at graduation. Flowsheet Row Cardiac Rehab from 01/14/2021 in [facility]  Cardiac and Pulmonary Rehab  Date 01/14/21  Educator [facility]  Instruction Review Code 1- Verbalizes Understanding       Infection Prevention: - Provides verbal and written material to individual with discussion of infection control including proper hand washing and proper equipment cleaning during exercise session. Flowsheet Row Cardiac Rehab from 01/14/2021 in [facility] Cardiac and Pulmonary Rehab  Date 09/10/20  Educator [facility]  Instruction Review Code 1- Verbalizes Understanding       Falls Prevention: - Provides verbal and written material to individual with discussion of falls prevention and safety. Flowsheet Row Cardiac Rehab from 01/14/2021 in [facility] Cardiac and Pulmonary Rehab  Date 09/10/20  Educator [facility]  Instruction Review Code 1- Verbalizes Understanding       Other: -Provides group and verbal instruction on various topics (see comments)   Knowledge Questionnaire Score:  Knowledge Questionnaire Score - 09/16/20 1524       Knowledge Questionnaire Score   Pre Score 19/26 Education Focus: Angina, nurtrition, exercise             Core Components/Risk Factors/Patient Goals at Admission:  Personal Goals and Risk Factors at Admission - 09/16/20 1524       Core Components/Risk Factors/Patient Goals on Admission    Weight Management Yes;Weight Loss    Intervention Weight Management: Develop a combined nutrition and exercise program designed to reach desired caloric intake, while maintaining appropriate intake of nutrient and fiber, sodium and fats, and appropriate energy expenditure required for the weight goal.;Weight Management: Provide education and  appropriate resources to help participant work on and attain dietary goals.;Weight Management/Obesity: Establish reasonable short term and long term weight goals.    Admit Weight 192 lb (87.1 kg)    Goal Weight: Short Term 185 lb (83.9 kg)    Goal Weight: Long Term 180 lb (81.6 kg)    Expected Outcomes Short Term: Continue to assess and modify interventions until short term weight is achieved;Long Term: Adherence to nutrition and physical activity/exercise program aimed toward attainment of established weight goal;Weight Loss: Understanding of general recommendations for a balanced deficit meal plan, which promotes 1-2 lb weight loss per week and includes a negative energy balance of [phone] kcal/d;Understanding recommendations for meals to include 15-35% energy as protein, 25-35% energy from fat, 35-60% energy from carbohydrates, less than 271m of dietary cholesterol, 20-35 gm of total fiber daily;Understanding of distribution of calorie intake throughout the day with the consumption of 4-5 meals/snacks    Diabetes Yes    Intervention Provide education about signs/symptoms and action to take for hypo/hyperglycemia.;Provide education about proper nutrition, including hydration, and aerobic/resistive exercise prescription along with prescribed medications to achieve blood glucose in normal ranges: Fasting glucose 65-99 mg/dL    Expected Outcomes Short Term: Participant verbalizes understanding of the signs/symptoms and immediate care of hyper/hypoglycemia, proper foot care and importance of medication, aerobic/resistive exercise and nutrition plan for blood glucose control.;Long Term: Attainment of HbA1C < 7%.    Hypertension Yes    Intervention Provide education on lifestyle modifcations including regular physical activity/exercise, weight management, moderate sodium restriction and increased consumption of fresh fruit, vegetables, and low fat dairy, alcohol moderation, and smoking cessation.;Monitor  prescription use compliance.    Expected Outcomes Short Term: Continued assessment and intervention until BP is < 140/986mHG in hypertensive participants. < 130/8040mG in hypertensive participants with diabetes, heart failure or chronic kidney disease.;Long Term: Maintenance of blood pressure at goal levels.    Lipids Yes    Intervention Provide education and  support for participant on nutrition & aerobic/resistive exercise along with prescribed medications to achieve LDL <36m, HDL >448m    Expected Outcomes Short Term: Participant states understanding of desired cholesterol values and is compliant with medications prescribed. Participant is following exercise prescription and nutrition guidelines.;Long Term: Cholesterol controlled with medications as prescribed, with individualized exercise RX and with personalized nutrition plan. Value goals: LDL < 7032mHDL > 40 mg.             Education:Diabetes - Individual verbal and written instruction to review signs/symptoms of diabetes, desired ranges of glucose level fasting, after meals and with exercise. Acknowledge that pre and post exercise glucose checks will be done for 3 sessions at entry of program. [facility]  Date 09/10/20  [facility] Code 1- Verbalizes Understanding       Core Components/Risk Factors/Patient Goals Review:   Goals and Risk Factor Review     Row Name 10/27/20 1107 11/17/20 1112 01/12/21 1103         Core Components/Risk Factors/Patient Goals Review   Personal Goals Review Weight Management/Obesity;Hypertension;Heart Failure Weight Management/Obesity;Hypertension;Heart Failure;Diabetes Weight Management/Obesity;Hypertension;Heart Failure;Diabetes     Review JimClair Gullingnts to lose more weight. He was 186 pounds today. He was 192 pounds when he started the program. His weight goal is to be below 180 pounds. JimClair Mathis continuing to lose  weight - today 183.6lbs. He was 192 lbs when he started the program. He continues to come to rehab and work on his nutrition with his wife. He reports his BP is about 120/70, today was lower at 108/58, but he was feeling fine. He continues to check his BP every morning. BG 110-115 in the am. He is still taking his medication as directed. His weight was 181 today.  His low while out was 174 lb but he is back to his base again.  Blood pressures are doing well and sugars are good too.  He has found a good eating routine to help manage his sugars better.  He denies any heart failure symptoms currently.     Expected Outcomes Short: lose more weight. Long: reach weight goal of 180 pounds. Short: continue to check vitals at home Long: reach weight goal of 180 pounds. Continue to monitor risk factors.              Core Components/Risk Factors/Patient Goals at Discharge (Final Review):   Goals and Risk Factor Review - 01/12/21 1103       Core Components/Risk Factors/Patient Goals Review   Personal Goals Review Weight Management/Obesity;Hypertension;Heart Failure;Diabetes    Review His weight was 181 today.  His low while out was 174 lb but he is back to his base again.  Blood pressures are doing well and sugars are good too.  He has found a good eating routine to help manage his sugars better.  He denies any heart failure symptoms currently.    Expected Outcomes Continue to monitor risk factors.             ITP Comments:  ITP Comments     Row Name 09/10/20 1008 09/16/20 1514 09/17/20 1039 10/07/20 0901 11/04/20 [id number] Comments Virtual Visit completed. Patient informed on EP and RD appointment and 6 Minute walk test. Patient also informed of patient health questionnaires on My Chart. Patient Verbalizes understanding. [facility] Completed 6MWT and gym orientation. Initial ITP created  and sent for review to Dr. Emily Filbert, Medical Director. First full day of  exercise!  Patient was oriented to gym and equipment including functions, settings, policies, and procedures.  Patient's individual exercise prescription and treatment plan were reviewed.  All starting workloads were established based on the results of the 6 minute walk test done at initial orientation visit.  The plan for exercise progression was also introduced and progression will be customized based on patient's performance and goals. 30 Day review completed. Medical Director ITP review done, changes made as directed, and signed approval by Medical Director.  New to program 30 Day review completed. Medical Director ITP review done, changes made as directed, and signed approval by Medical Director.   2 visits in May    Row Name 12/02/20 0641 12/03/20 1157 12/03/20 1202 12/29/20 1539 12/30/20 0619   ITP Comments 30 Day review completed. Medical Director ITP review done, changes made as directed, and signed approval by Medical Director. Ed graduated today from  rehab with 35 sessions completed.  Details of the patient's exercise prescription and what He needs to do in order to continue the prescription and progress were discussed with patient.  Patient was given a copy of prescription and goals.  Patient verbalized understanding.  Carlos Mathis plans to continue to exercise by ride exercise bike at home, swim and walk.. ERROR   Not graduating today Out for ED visits and COVID exposures 30 Day review completed. Medical Director ITP review done, changes made as directed, and signed approval by Medical Director.    Row Name 01/12/21 1100 01/19/21 1147         ITP Comments Carlos Mathis returned today after being out with COVID exposure and then testing positive. Carlos Mathis graduated today from  rehab with 36 sessions completed.  Details of the patient's exercise prescription and what He needs to do in order to continue the prescription and progress were discussed with patient.  Patient was given a copy of prescription and goals.   Patient verbalized understanding.  Carlos Mathis plans to continue to exercise by joining the [employer].               Comments: Discharge ITP

## 2021-01-19 NOTE — Patient Instructions (Signed)
Discharge Patient Instructions  Patient Details  Name: Patty Lopezgarcia MRN: [id number] Date of Birth: [birth year] Referring Provider:  [name], MD   Number of Visits: 61  Reason for Discharge:  Patient reached a stable level of exercise. Patient independent in their exercise. Patient has met program and personal goals.  Smoking History:  Social History   Tobacco Use  Smoking Status Former   Packs/day: 1.00   Years: 20.00   Pack years: 20.00   Types: Cigarettes   Quit date: 09/11/1983   Years since quitting: 37.3  Smokeless Tobacco Former   Types: Chew   Quit date: 09/11/1983    Diagnosis:  ST elevation myocardial infarction (STEMI), unspecified artery ([place])  Status post coronary artery stent placement  Initial Exercise Prescription:  Initial Exercise Prescription - 09/16/20 1500       Date of Initial Exercise RX and Referring Provider   Date 09/16/20    Referring Provider [name] MD      Treadmill   MPH 1.9    Grade 0.5    Minutes 15    METs 2.59      REL-XR   Level 1    Speed 50    Minutes 15    METs 2.5      T5 Nustep   Level 1    SPM 80    Minutes 15    METs 2.5      Prescription Details   Frequency (times per week) 2    Duration Progress to 30 minutes of continuous aerobic without signs/symptoms of physical distress      Intensity   THRR 40-80% of Max Heartrate 104-129    Ratings of Perceived Exertion 11-13    Perceived Dyspnea 0-4      Progression   Progression Continue to progress workloads to maintain intensity without signs/symptoms of physical distress.      Resistance Training   Training Prescription Yes    Weight 3 lb    Reps 10-15             Discharge Exercise Prescription (Final Exercise Prescription Changes):  Exercise Prescription Changes - 01/12/21 1300       Response to Exercise   Blood Pressure (Admit) 134/70    Blood Pressure (Exit) 114/58    Heart Rate (Admit) 64 bpm    Heart Rate  (Exercise) 98 bpm    Heart Rate (Exit) 75 bpm    Oxygen Saturation (Admit) 96 %    Oxygen Saturation (Exercise) 97 %    Oxygen Saturation (Exit) 95 %    Rating of Perceived Exertion (Exercise) 12    Symptoms none    Duration Continue with 30 min of aerobic exercise without signs/symptoms of physical distress.    Intensity THRR unchanged      Progression   Progression Continue to progress workloads to maintain intensity without signs/symptoms of physical distress.    Average METs 2.9      Resistance Training   Training Prescription Yes    Weight 4 lb    Reps 10-15      Interval Training   Interval Training No      Treadmill   MPH 2    Grade 0.5    Minutes 15    METs 2.67      Recumbant Bike   Level 9    Minutes 15    METs 2.75             Functional Capacity:  6 Minute  Walk     Row Name 09/16/20 1514 12/08/20 1232       6 Minute Walk   Phase Initial Discharge    Distance 1165 feet 1200 feet    Distance % Change -- 3 %    Distance Feet Change -- 35 ft    Walk Time 6 minutes 6 minutes    # of Rest Breaks 0 0    MPH 2.21 2.27    METS 2.45 1.93    RPE 13 13    Perceived Dyspnea  -- 0    VO2 Peak 8.57 6.75    Symptoms Yes (comment) No    Comments shuffling feet, felt stumbly at end --    Resting HR 80 bpm 68 bpm    Resting BP 154/62 114/56    Resting Oxygen Saturation  99 % 95 %    Exercise Oxygen Saturation  during 6 min walk 99 % 97 %    Max Ex. HR 118 bpm 76 bpm    Max Ex. BP 148/74 108/58    2 Minute Post BP 112/62 --             Quality of Life:  Quality of Life - 09/16/20 1523       Quality of Life   Select Quality of Life      Quality of Life Scores   Health/Function Pre 24.33 %    Socioeconomic Pre 23.75 %    Psych/Spiritual Pre 29.64 %    Family Pre 26.4 %    GLOBAL Pre 25.56 %             Personal Goals: Goals established at orientation with interventions provided to work toward goal.  Personal Goals and Risk Factors  at Admission - 09/16/20 1524       Core Components/Risk Factors/Patient Goals on Admission    Weight Management Yes;Weight Loss    Intervention Weight Management: Develop a combined nutrition and exercise program designed to reach desired caloric intake, while maintaining appropriate intake of nutrient and fiber, sodium and fats, and appropriate energy expenditure required for the weight goal.;Weight Management: Provide education and appropriate resources to help participant work on and attain dietary goals.;Weight Management/Obesity: Establish reasonable short term and long term weight goals.    Admit Weight 192 lb (87.1 kg)    Goal Weight: Short Term 185 lb (83.9 kg)    Goal Weight: Long Term 180 lb (81.6 kg)    Expected Outcomes Short Term: Continue to assess and modify interventions until short term weight is achieved;Long Term: Adherence to nutrition and physical activity/exercise program aimed toward attainment of established weight goal;Weight Loss: Understanding of general recommendations for a balanced deficit meal plan, which promotes 1-2 lb weight loss per week and includes a negative energy balance of [phone] kcal/d;Understanding recommendations for meals to include 15-35% energy as protein, 25-35% energy from fat, 35-60% energy from carbohydrates, less than 233m of dietary cholesterol, 20-35 gm of total fiber daily;Understanding of distribution of calorie intake throughout the day with the consumption of 4-5 meals/snacks    Diabetes Yes    Intervention Provide education about signs/symptoms and action to take for hypo/hyperglycemia.;Provide education about proper nutrition, including hydration, and aerobic/resistive exercise prescription along with prescribed medications to achieve blood glucose in normal ranges: Fasting glucose 65-99 mg/dL    Expected Outcomes Short Term: Participant verbalizes understanding of the signs/symptoms and immediate care of hyper/hypoglycemia, proper foot care  and importance of medication, aerobic/resistive exercise and nutrition plan  for blood glucose control.;Long Term: Attainment of HbA1C < 7%.    Hypertension Yes    Intervention Provide education on lifestyle modifcations including regular physical activity/exercise, weight management, moderate sodium restriction and increased consumption of fresh fruit, vegetables, and low fat dairy, alcohol moderation, and smoking cessation.;Monitor prescription use compliance.    Expected Outcomes Short Term: Continued assessment and intervention until BP is < 140/64m HG in hypertensive participants. < 130/811mHG in hypertensive participants with diabetes, heart failure or chronic kidney disease.;Long Term: Maintenance of blood pressure at goal levels.    Lipids Yes    Intervention Provide education and support for participant on nutrition & aerobic/resistive exercise along with prescribed medications to achieve LDL <7050mHDL >58m33m  Expected Outcomes Short Term: Participant states understanding of desired cholesterol values and is compliant with medications prescribed. Participant is following exercise prescription and nutrition guidelines.;Long Term: Cholesterol controlled with medications as prescribed, with individualized exercise RX and with personalized nutrition plan. Value goals: LDL < 70mg26mL > 40 mg.              Personal Goals Discharge:  Goals and Risk Factor Review - 01/12/21 1103       Core Components/Risk Factors/Patient Goals Review   Personal Goals Review Weight Management/Obesity;Hypertension;Heart Failure;Diabetes    Review His weight was 181 today.  His low while out was 174 lb but he is back to his base again.  Blood pressures are doing well and sugars are good too.  He has found a good eating routine to help manage his sugars better.  He denies any heart failure symptoms currently.    Expected Outcomes Continue to monitor risk factors.             Exercise Goals and  Review:  Exercise Goals     Row Name 09/16/20 1522             Exercise Goals   Increase Physical Activity Yes       Intervention Provide advice, education, support and counseling about physical activity/exercise needs.;Develop an individualized exercise prescription for aerobic and resistive training based on initial evaluation findings, risk stratification, comorbidities and participant's personal goals.       Expected Outcomes Short Term: Attend rehab on a regular basis to increase amount of physical activity.;Long Term: Add in home exercise to make exercise part of routine and to increase amount of physical activity.;Long Term: Exercising regularly at least 3-5 days a week.       Increase Strength and Stamina Yes       Intervention Provide advice, education, support and counseling about physical activity/exercise needs.;Develop an individualized exercise prescription for aerobic and resistive training based on initial evaluation findings, risk stratification, comorbidities and participant's personal goals.       Expected Outcomes Short Term: Increase workloads from initial exercise prescription for resistance, speed, and METs.;Short Term: Perform resistance training exercises routinely during rehab and add in resistance training at home;Long Term: Improve cardiorespiratory fitness, muscular endurance and strength as measured by increased METs and functional capacity (6MWT)       Able to understand and use rate of perceived exertion (RPE) scale Yes       Intervention Provide education and explanation on how to use RPE scale       Expected Outcomes Short Term: Able to use RPE daily in rehab to express subjective intensity level;Long Term:  Able to use RPE to guide intensity level when exercising independently  Able to understand and use Dyspnea scale Yes       Intervention Provide education and explanation on how to use Dyspnea scale       Expected Outcomes Short Term: Able to use Dyspnea  scale daily in rehab to express subjective sense of shortness of breath during exertion;Long Term: Able to use Dyspnea scale to guide intensity level when exercising independently       Knowledge and understanding of Target Heart Rate Range (THRR) Yes       Intervention Provide education and explanation of THRR including how the numbers were predicted and where they are located for reference       Expected Outcomes Short Term: Able to state/look up THRR;Long Term: Able to use THRR to govern intensity when exercising independently;Short Term: Able to use daily as guideline for intensity in rehab       Able to check pulse independently Yes       Intervention Provide education and demonstration on how to check pulse in carotid and radial arteries.;Review the importance of being able to check your own pulse for safety during independent exercise       Expected Outcomes Short Term: Able to explain why pulse checking is important during independent exercise;Long Term: Able to check pulse independently and accurately       Understanding of Exercise Prescription Yes       Intervention Provide education, explanation, and written materials on patient's individual exercise prescription       Expected Outcomes Short Term: Able to explain program exercise prescription;Long Term: Able to explain home exercise prescription to exercise independently                Exercise Goals Re-Evaluation:  Exercise Goals Re-Evaluation     Row Name 09/17/20 1039 10/07/20 1332 10/20/20 1310 11/03/20 1539 11/17/20 1116     Exercise Goal Re-Evaluation   Exercise Goals Review Increase Physical Activity;Able to understand and use rate of perceived exertion (RPE) scale;Knowledge and understanding of Target Heart Rate Range (THRR);Understanding of Exercise Prescription;Increase Strength and Stamina;Able to check pulse independently Increase Physical Activity;Increase Strength and Stamina Increase Physical Activity;Increase  Strength and Stamina Increase Physical Activity;Increase Strength and Stamina;Understanding of Exercise Prescription Increase Physical Activity;Increase Strength and Stamina;Understanding of Exercise Prescription   Comments Reviewed RPE and dyspnea scales, THR and program prescription with pt today.  Pt voiced understanding and was given a copy of goals to take home. Clair Gulling is tolerating exercise well and reaches his THR range.  He has worked up to prescribed speed on TM.  We will continue to monitor progress. Clair Gulling attends consistently.  Staff will encourage trying 5 lb for strength work. Clair Gulling is doing well in rehab.  He is now on level 6 for the NuStep.  We will continue to montior his progress. Clair Gulling is attending rehab consistently. He reports it is hard to exercise at home as his equipment is upstairs. Discussed other options such as walking outside when it is nice and going early in the am before it gets too hot.   Expected Outcomes Short: Use RPE daily to regulate intensity. Long: Follow program prescription in THR. Short:  continue to follow program prescription Long: improve overall stamina Short: maintain consistent attendance Long:  increase MET level Short: Try to increase handweights Long: Continue to improve stamina Short: Walk at home outside - in am when very hot. Long: Continue to improve stamina    Row Name 12/02/20 1055 12/15/20 1404 12/29/20 1539 01/12/21 1100  Exercise Goal Re-Evaluation   Exercise Goals Review Increase Physical Activity;Increase Strength and Stamina;Understanding of Exercise Prescription -- -- Increase Physical Activity;Increase Strength and Stamina;Understanding of Exercise Prescription    Comments Clair Gulling is doing well in rehab. He is now up to level 9 on the Recumbant bike and works in appropriate RPE range. Will continue to monitor. Clair Gulling will complete HT in 4 sessions. Out for ED visits and COVID exposures Clair Gulling was out with COVID.  He has been walking a mile twice a day to  rebuild his strength. He had spent 5 days in bed.  But now he is feeling better and glad to be back.  He plans to keep walking after graduation    Expected Outcomes Short: Continue to increase loads with exercise Long: Improve overall MET level Short:complete HT long:  maintain exercie on his own -- Short: Graduate Long: Continue to exercise independently             Nutrition & Weight - Outcomes:  Pre Biometrics - 09/16/20 1523       Pre Biometrics   Height 5' 8.7" (1.745 m)    Weight 192 lb (87.1 kg)    BMI (Calculated) 28.6    Single Leg Stand 6 seconds              Nutrition:  Nutrition Therapy & Goals - 09/29/20 1031       Nutrition Therapy   Diet Heart healthy, low Na, diabetes friendly    Drug/Food Interactions Statins/Certain Fruits    Protein (specify units) 70g    Fiber 30 grams    Whole Grain Foods 3 servings    Saturated Fats 12 max. grams    Fruits and Vegetables 8 servings/day    Sodium 1.5 grams      Personal Nutrition Goals   Nutrition Goal ST: try banana "nice cream" LT: continue with heart healthy changes    Comments 7.7 A1C. 7-8am BG: 115-140 (140 due to eating out). Eats out every once in a while. Uses monkfruit and stevia. Wife hardly ever puts salt in. Breakfast (5-5HR): bacon 1 slice every other day, 1 egg - mushrooms, low fat and low Na cheese. (coffee - creamer (no sugar)). S: Tangerine or banana Lunch (12-12:30pm): has to have lunch at this time due to downward spiral. Low Na tuna for tuna salad with some mayo and relish with solo bread (3g net carbs) two gerkins pickles. Tortilla chips (low Na, low saturated fat) with salsa. S: peanut butter and apple or cheese or carrots/celery/broccoli or cheetos if he is having a bad day. Dinner: meat (pork loin, hamburger 97% lean, chicken - dark meat, and fish) and vegetables (broccoli, asparagus, cabbage, greens like collards, turnip roots, he does not like cook tomatoes, some potatoes, green beans, spaghetti  squash, makes her own tomato sauce, zucchini tots, onions, cook beans every week. Limits red meat, uses olive oil and avocado oil, uses lots of herbs and mrs dash, they like salmon and tilapia. They also have jello and whipped cream (small portion) with dinner. Discussed heart healthy and diabetes friendly eating.      Intervention Plan   Intervention Prescribe, educate and counsel regarding individualized specific dietary modifications aiming towards targeted core components such as weight, hypertension, lipid management, diabetes, heart failure and other comorbidities.;Nutrition handout(s) given to patient.    Expected Outcomes Short Term Goal: Understand basic principles of dietary content, such as calories, fat, sodium, cholesterol and nutrients.;Short Term Goal: A plan has been developed  with personal nutrition goals set during dietitian appointment.;Long Term Goal: Adherence to prescribed nutrition plan.             Nutrition Discharge:   Education Questionnaire Score:  Knowledge Questionnaire Score - 09/16/20 1524       Knowledge Questionnaire Score   Pre Score 19/26 Education Focus: Angina, nurtrition, exercise             Goals reviewed with patient; copy given to patient.

## 2021-01-19 NOTE — Progress Notes (Signed)
Discharge Progress Report  Patient Details  Name: Carlos Mathis MRN: [id number] Date of Birth: 25-Nov-1940 Referring Provider:   Flowsheet Row Cardiac Rehab from 09/16/2020 in [facility]  Referring Provider [name] MD        Number of Visits: 36  Reason for Discharge:  Patient reached a stable level of exercise. Patient independent in their exercise.   Diagnosis:  ST elevation myocardial infarction (STEMI), unspecified artery ([place])  Status post coronary artery stent placement    Initial Exercise Prescription:  Initial Exercise Prescription - 09/16/20 1500       Date of Initial Exercise RX and Referring Provider   Date 09/16/20    Referring Provider [name] MD      Treadmill   MPH 1.9    Grade 0.5    Minutes 15    METs 2.59      REL-XR   Level 1    Speed 50    Minutes 15    METs 2.5      T5 Nustep   Level 1    SPM 80    Minutes 15    METs 2.5      Prescription Details   Frequency (times per week) 2    Duration Progress to 30 minutes of continuous aerobic without signs/symptoms of physical distress      Intensity   THRR 40-80% of Max Heartrate 104-129    Ratings of Perceived Exertion 11-13    Perceived Dyspnea 0-4      Progression   Progression Continue to progress workloads to maintain intensity without signs/symptoms of physical distress.      Resistance Training   Training Prescription Yes    Weight 3 lb    Reps 10-15             Discharge Exercise Prescription (Final Exercise Prescription Changes):  Exercise Prescription Changes - 01/12/21 1300       Response to Exercise   Blood Pressure (Admit) 134/70    Blood Pressure (Exit) 114/58    Heart Rate (Admit) 64 bpm    Heart Rate (Exercise) 98 bpm    Heart Rate (Exit) 75 bpm    Oxygen Saturation (Admit) 96 %    Oxygen Saturation (Exercise) 97 %    Oxygen Saturation (Exit) 95 %    Rating of Perceived Exertion (Exercise) 12    Symptoms  none    Duration Continue with 30 min of aerobic exercise without signs/symptoms of physical distress.    Intensity THRR unchanged      Progression   Progression Continue to progress workloads to maintain intensity without signs/symptoms of physical distress.    Average METs 2.9      Resistance Training   Training Prescription Yes    Weight 4 lb    Reps 10-15      Interval Training   Interval Training No      Treadmill   MPH 2    Grade 0.5    Minutes 15    METs 2.67      Recumbant Bike   Level 9    Minutes 15    METs 2.75             Functional Capacity:  6 Minute Walk     Row Name 09/16/20 1514 12/08/20 1232       6 Minute Walk   Phase Initial Discharge    Distance 1165 feet 1200 feet    Distance % Change --  3 %    Distance Feet Change -- 35 ft    Walk Time 6 minutes 6 minutes    # of Rest Breaks 0 0    MPH 2.21 2.27    METS 2.45 1.93    RPE 13 13    Perceived Dyspnea  -- 0    VO2 Peak 8.57 6.75    Symptoms Yes (comment) No    Comments shuffling feet, felt stumbly at end --    Resting HR 80 bpm 68 bpm    Resting BP 154/62 114/56    Resting Oxygen Saturation  99 % 95 %    Exercise Oxygen Saturation  during 6 min walk 99 % 97 %    Max Ex. HR 118 bpm 76 bpm    Max Ex. BP 148/74 108/58    2 Minute Post BP 112/62 --             Psychological, QOL, Others - Outcomes: PHQ 2/9: Depression screen [facility] 2/9 10/27/2020 09/16/2020  Decreased Interest 0 1  Down, Depressed, Hopeless 0 1  PHQ - 2 Score 0 2  Altered sleeping 0 0  Tired, decreased energy 0 1  Change in appetite 1 1  Feeling bad or failure about yourself  0 0  Trouble concentrating 3 1  Moving slowly or fidgety/restless 0 1  Suicidal thoughts 0 0  PHQ-9 Score 4 6  Difficult doing work/chores Not difficult at all Not difficult at all    Nutrition:  Nutrition Therapy & Goals - 09/29/20 1031       Nutrition Therapy   Diet Heart healthy, low Na, diabetes friendly    Drug/Food  Interactions Statins/Certain Fruits    Protein (specify units) 70g    Fiber 30 grams    Whole Grain Foods 3 servings    Saturated Fats 12 max. grams    Fruits and Vegetables 8 servings/day    Sodium 1.5 grams      Personal Nutrition Goals   Nutrition Goal ST: try banana "nice cream" LT: continue with heart healthy changes    Comments 7.7 A1C. 7-8am BG: 115-140 (140 due to eating out). Eats out every once in a while. Uses monkfruit and stevia. Wife hardly ever puts salt in. Breakfast (0000000): bacon 1 slice every other day, 1 egg - mushrooms, low fat and low Na cheese. (coffee - creamer (no sugar)). S: Tangerine or banana Lunch (12-12:30pm): has to have lunch at this time due to downward spiral. Low Na tuna for tuna salad with some mayo and relish with solo bread (3g net carbs) two gerkins pickles. Tortilla chips (low Na, low saturated fat) with salsa. S: peanut butter and apple or cheese or carrots/celery/broccoli or cheetos if he is having a bad day. Dinner: meat (pork loin, hamburger 97% lean, chicken - dark meat, and fish) and vegetables (broccoli, asparagus, cabbage, greens like collards, turnip roots, he does not like cook tomatoes, some potatoes, green beans, spaghetti squash, makes her own tomato sauce, zucchini tots, onions, cook beans every week. Limits red meat, uses olive oil and avocado oil, uses lots of herbs and mrs dash, they like salmon and tilapia. They also have jello and whipped cream (small portion) with dinner. Discussed heart healthy and diabetes friendly eating.      Intervention Plan   Intervention Prescribe, educate and counsel regarding individualized specific dietary modifications aiming towards targeted core components such as weight, hypertension, lipid management, diabetes, heart failure and other comorbidities.;Nutrition handout(s) given to patient.  Expected Outcomes Short Term Goal: Understand basic principles of dietary content, such as calories, fat, sodium,  cholesterol and nutrients.;Short Term Goal: A plan has been developed with personal nutrition goals set during dietitian appointment.;Long Term Goal: Adherence to prescribed nutrition plan.            Goals reviewed with patient; copy given to patient.

## 2021-06-24 DIAGNOSIS — Z0000 Encounter for general adult medical examination without abnormal findings: Secondary | ICD-10-CM | POA: Insufficient documentation

## 2021-06-24 DIAGNOSIS — E1142 Type 2 diabetes mellitus with diabetic polyneuropathy: Secondary | ICD-10-CM | POA: Insufficient documentation

## 2021-07-31 ENCOUNTER — Emergency Department
Admission: EM | Admit: 2021-07-31 | Discharge: 2021-07-31 | Disposition: A | Discharge status: Home / Self Care | Payer: Medicare Other | Attending: Emergency Medicine | Admitting: Emergency Medicine

## 2021-07-31 ENCOUNTER — Encounter: Payer: Self-pay | Admitting: Emergency Medicine

## 2021-07-31 ENCOUNTER — Other Ambulatory Visit: Payer: Self-pay

## 2021-07-31 ENCOUNTER — Emergency Department: Payer: Medicare Other

## 2021-07-31 DIAGNOSIS — R42 Dizziness and giddiness: Secondary | ICD-10-CM

## 2021-07-31 DIAGNOSIS — I10 Essential (primary) hypertension: Secondary | ICD-10-CM | POA: Diagnosis not present

## 2021-07-31 DIAGNOSIS — R110 Nausea: Secondary | ICD-10-CM | POA: Diagnosis not present

## 2021-07-31 LAB — BASIC METABOLIC PANEL
Anion gap: 6 (ref 5–15)
BUN: 20 mg/dL (ref 8–23)
CO2: 26 mmol/L (ref 22–32)
Calcium: 9.3 mg/dL (ref 8.9–10.3)
Chloride: 107 mmol/L (ref 98–111)
Creatinine, Ser: 1.06 mg/dL (ref 0.61–1.24)
GFR, Estimated: >60 mL/min (ref >60)
Glucose, Bld: 154 mg/dL — ABNORMAL HIGH (ref 70–99)
Potassium: 4.3 mmol/L (ref 3.5–5.1)
Sodium: 139 mmol/L (ref 135–145)

## 2021-07-31 LAB — CBC
HCT: 48.4 % (ref 39.0–52.0)
Hemoglobin: 16 g/dL (ref 13.0–17.0)
MCH: 32.9 pg (ref 26.0–34.0)
MCHC: 33.1 g/dL (ref 30.0–36.0)
MCV: 99.4 fL (ref 80.0–100.0)
Platelets: 172 10*3/uL (ref 150–400)
RBC: 4.87 MIL/uL (ref 4.22–5.81)
RDW: 12.2 % (ref 11.5–15.5)
WBC: 7 10*3/uL (ref 4.0–10.5)
nRBC: 0 % (ref 0.0–0.2)

## 2021-07-31 MED ORDER — MECLIZINE HCL 25 MG PO TABS: 25.0000 mg | ORAL_TABLET | Freq: Three times a day (TID) | ORAL | 0 refills | Status: AC | PRN

## 2021-07-31 MED ORDER — MECLIZINE HCL 25 MG PO TABS
25.0000 mg | ORAL_TABLET | Freq: Once | ORAL | Status: AC
  Administered 2021-07-31: 25 mg via ORAL
  Filled 2021-07-31: qty 1

## 2021-07-31 MED ORDER — ONDANSETRON 4 MG PO TBDP
4.0000 mg | ORAL_TABLET | Freq: Once | ORAL | Status: AC
  Administered 2021-07-31: 4 mg via ORAL
  Filled 2021-07-31: qty 1

## 2021-07-31 NOTE — Discharge Instructions (Addendum)
Your CT of your brain was normal.  Your vertigo was likely due to an inner ear problem.  You can take the meclizine as needed when you are dizzy only.  If your symptoms are returning and not going away or you develop any numbness, weakness or visual change, please return to the emergency department.

## 2021-07-31 NOTE — ED Triage Notes (Signed)
Pt via POV from home. Pt c/o dizziness and nausea that started yesterday. Pt states it gets worse when he changes position. States it gets better when he lays down. Pt states he was dx with vertigo years ago. Denies any pain. Pt is A&Ox4 and NAD

## 2021-07-31 NOTE — ED Provider Notes (Signed)
[facility] Provider Note    Event Date/Time   First MD Initiated Contact with Patient 07/31/21 1036     (approximate)   History   Dizziness   HPI  Carlos Mathis is a 81 y.o. male  with pmh HTN and MI who presents with dizziness.  Symptoms started last night but then were most noticeable this morning.  Patient endorses feeling a sense moving sensation especially when he is walking and when he turns his head certain directions.  At rest his largely asymptomatic.  He had an episode of vertigo similar to this many years ago.  He denies any diplopia dysarthria numbness tingling weakness headache and.  Has had some nausea but no vomiting.  Denies chest pain or shortness of breath.     Past Medical History:  Diagnosis Date   Hypertension    MI (myocardial infarction) ([place])     There are no problems to display for this patient.    Physical Exam  Triage Vital Signs: ED Triage Vitals  Enc Vitals Group     BP 07/31/21 0935 136/82     Pulse Rate 07/31/21 0935 64     Resp 07/31/21 0935 18     Temp 07/31/21 0935 98 F (36.7 C)     Temp Source 07/31/21 0935 Oral     SpO2 07/31/21 0935 96 %     Weight 07/31/21 0933 181 lb (82.1 kg)     Height 07/31/21 0933 5\' 10"  (1.778 m)     Head Circumference --      Peak Flow --      Pain Score 07/31/21 0933 0     Pain Loc --      Pain Edu? --      Excl. in [place]? --     Most recent vital signs: Vitals:   07/31/21 0935 07/31/21 1233  BP: 136/82 120/78  Pulse: 64 68  Resp: 18 18  Temp: 98 F (36.7 C)   SpO2: 96% 95%     General: Awake, no distress.  CV:  Good peripheral perfusion.  Resp:  Normal effort.  Abd:  No distention.  Neuro:             Awake, Alert, Oriented x 3  Other:  Aox3, nml speech  PERRL, EOMI, face symmetric, nml tongue movement  5/5 strength in the BL upper and lower extremities  Sensation grossly intact in the BL upper and lower extremities  Finger-nose-finger intact BL Stable gait,  no ataxia   ED Results / Procedures / Treatments  Labs (all labs ordered are listed, but only abnormal results are displayed) Labs Reviewed  BASIC METABOLIC PANEL - Abnormal; Notable for the following components:      Result Value   Glucose, Bld 154 (*)    All other components within normal limits  CBC  URINALYSIS, ROUTINE W REFLEX MICROSCOPIC  CBG MONITORING, ED     EKG  EKG interpreted myself, normal sinus rhythm, first-degree AV block, poor R wave progression, no acute ischemic changes   RADIOLOGY I reviewed the CT scan of the brain which does not show any acute intracranial process; agree with radiology report     PROCEDURES:    MEDICATIONS ORDERED IN ED: Medications  meclizine (ANTIVERT) tablet 25 mg (25 mg Oral Given 07/31/21 1101)  ondansetron (ZOFRAN-ODT) disintegrating tablet 4 mg (4 mg Oral Given 07/31/21 1101)     IMPRESSION / MDM / ASSESSMENT AND PLAN / ED COURSE  I  reviewed the triage vital signs and the nursing notes.                              Differential diagnosis includes, but is not limited to, fibular neuritis, BPPV, labyrinthitis, posterior CVA  81 year old male presents with vertigo.  Started last night.  He is largely asymptomatic at rest when he is not moving his head.  Symptoms made worse by head movement and with walking.  Has had nausea but no vomiting.  He has no other neurologic symptoms including dysarthria diplopia numbness weakness or headache.  On physical exam he has no concerning nystagmus and no dysmetria or ataxia and normal strength.  Vital signs within normal limits.  CT head obtained given his age and risk factors which is negative for acute abnormality.  Labs also obtained from triage which are reassuring, normal CBC and BMP.  Patient was given meclizine and Zofran.  He did have an episode of emesis but says that this was because he did not like the taste of the Zofran.  After meclizine patient's symptoms of resolved.  He is able  to walk with steady gait.  Overall I am reassured by his lack of other neurologic symptoms and his exam that this is less likely to be a posterior CVA.  He is appropriate for discharge.  Patient and his wife do note that he has had some difficulty with short-term memory and intermittent confusion.  Would like to be referred to neurology.   FINAL CLINICAL IMPRESSION(S) / ED DIAGNOSES   Final diagnoses:  Dizziness     Rx / DC Orders   ED Discharge Orders          Ordered    meclizine (ANTIVERT) 25 MG tablet  3 times daily PRN        07/31/21 1208             Note:  This document was prepared using Dragon voice recognition software and may include unintentional dictation errors.   Rada Hay, MD 07/31/21 [phone]

## 2021-08-24 ENCOUNTER — Ambulatory Visit: Payer: Self-pay | Admitting: General Surgery

## 2021-08-25 ENCOUNTER — Other Ambulatory Visit: Payer: Self-pay

## 2021-08-25 ENCOUNTER — Encounter
Admission: RE | Admit: 2021-08-25 | Discharge: 2021-08-25 | Disposition: A | Discharge status: Home / Self Care | Payer: Medicare Other | Source: Ambulatory Visit | Attending: General Surgery | Admitting: General Surgery

## 2021-08-25 HISTORY — DX: Unspecified osteoarthritis, unspecified site: M19.90

## 2021-08-25 HISTORY — DX: Atherosclerotic heart disease of native coronary artery without angina pectoris: I25.10

## 2021-08-25 HISTORY — DX: Headache, unspecified: R51.9

## 2021-08-25 HISTORY — DX: Personal history of other diseases of the digestive system: Z87.19

## 2021-08-25 HISTORY — DX: Type 2 diabetes mellitus without complications: E11.9

## 2021-08-25 HISTORY — DX: Malignant (primary) neoplasm, unspecified: C80.1

## 2021-08-25 HISTORY — DX: Sleep apnea, unspecified: G47.30

## 2021-08-25 HISTORY — DX: Pneumonia, unspecified organism: J18.9

## 2021-08-25 HISTORY — DX: Gastro-esophageal reflux disease without esophagitis: K21.9

## 2021-08-25 NOTE — Patient Instructions (Addendum)
Your procedure is scheduled on:09-01-21 Wednesday ?Report to the Registration Desk on the 1st floor of the [facility].Then proceed to the 2nd floor Surgery Desk in the [facility] ?To find out your arrival time, please call [phone] between 1PM - 3PM on:08-31-21 Tuesday ? ?REMEMBER: ?Instructions that are not followed completely may result in serious medical risk, up to and including death; or upon the discretion of your surgeon and anesthesiologist your surgery may need to be rescheduled. ? ?Do not eat food after midnight the night before surgery.  ?No gum chewing, lozengers or hard candies. ? ?You may however, drink Water up to 2 hours before you are scheduled to arrive for your surgery. Do not drink anything within 2 hours of your scheduled arrival time. ? ?Type 1 and Type 2 diabetics should only drink water. ? ?TAKE THESE MEDICATIONS THE MORNING OF SURGERY WITH A SIP OF WATER: ?-metoprolol succinate (TOPROL-XL) ? ?Stop your metFORMIN (GLUMETZA) 2 days prior to surgery-Last dose on 08-29-21 Sunday ? ?Stop your empagliflozin (JARDIANCE) 3 days prior to surgery-Last dose on 08-28-21 Saturday ? ?Stop your ticagrelor (BRILINTA) 7 days prior to surgery as instructed by Dr Glade Stanford dose was taken on 08-23-21 Monday ? ?Stop your aspirin 81 MG EC tablet 5 days prior to surgery-Last dose taken on 08-25-21 Wednesday ? ?One week prior to surgery: ?Stop Anti-inflammatories (NSAIDS) such as Advil, Aleve, Ibuprofen, Motrin, Naproxen, Naprosyn and Aspirin based products such as Excedrin, Goodys Powder, BC Powder.You may however, continue to take Tylenol if needed for pain up until the day of surgery. ? ?Stop ANY OVER THE COUNTER supplements/vitamins NOW (08-25-21) until after surgery (Multiple Vitamin ,OMEGA-3 FISH OIL, Nerve Control 911, Prevagen) ? ?No Alcohol for 24 hours before or after surgery. ? ?No Smoking including e-cigarettes for 24 hours prior to surgery.  ?No chewable tobacco products for at least 6 hours prior  to surgery.  ?No nicotine patches on the day of surgery. ? ?Do not use any "recreational" drugs for at least a week prior to your surgery.  ?Please be advised that the combination of cocaine and anesthesia may have negative outcomes, up to and including death. ?If you test positive for cocaine, your surgery will be cancelled. ? ?On the morning of surgery brush your teeth with toothpaste and water, you may rinse your mouth with mouthwash if you wish. ?Do not swallow any toothpaste or mouthwash. ? ?Use CHG Soap as directed on instruction sheet. ? ?Do not wear jewelry, make-up, hairpins, clips or nail polish. ? ?Do not wear lotions, powders, or perfumes.  ? ?Do not shave body from the neck down 48 hours prior to surgery just in case you cut yourself which could leave a site for infection.  ?Also, freshly shaved skin may become irritated if using the CHG soap. ? ?Contact lenses, hearing aids and dentures may not be worn into surgery. ? ?Do not bring valuables to the hospital. [facility] is not responsible for any missing/lost belongings or valuables.  ? ?Bring your C-PAP to the hospital with you  ? ?Notify your doctor if there is any change in your medical condition (cold, fever, infection). ? ?Wear comfortable clothing (specific to your surgery type) to the hospital. ? ?After surgery, you can help prevent lung complications by doing breathing exercises.  ?Take deep breaths and cough every 1-2 hours. Your doctor may order a device called an Incentive Spirometer to help you take deep breaths. ?When coughing or sneezing, hold a pillow firmly against your incision  with both hands. This is called ?splinting.? Doing this helps protect your incision. It also decreases belly discomfort. ? ?If you are being admitted to the hospital overnight, leave your suitcase in the car. ?After surgery it may be brought to your room. ? ?If you are being discharged the day of surgery, you will not be allowed to drive home. ?You will need a  responsible adult (18 years or older) to drive you home and stay with you that night.  ? ?If you are taking public transportation, you will need to have a responsible adult (18 years or older) with you. ?Please confirm with your physician that it is acceptable to use public transportation.  ? ?Please call the [place] Dept. at [phone] if you have any questions about these instructions. ? ?Surgery Visitation Policy: ? ?Patients undergoing a surgery or procedure may have one family member or support person with them as long as that person is not COVID-19 positive or experiencing its symptoms.  ?That person may remain in the waiting area during the procedure and may rotate out with other people. ?

## 2021-08-26 ENCOUNTER — Encounter: Payer: Self-pay | Admitting: General Surgery

## 2021-08-26 NOTE — Progress Notes (Signed)
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 Clinical nursing staff has updated patient's PMH/PSHx, current medication list, and drug allergies/intolerances to ensure comprehensive history available to assist in medical decision making as it pertains to the aforementioned surgical procedure and anticipated anesthetic course. Extensive review of available clinical information performed. [place] PMH and PSHx updated with any diagnoses/procedures that  may have been inadvertently omitted during his intake with the pre-admission testing department's nursing staff.  Clinical Discussion:  Carlos Mathis is a 81 y.o. male who is submitted for pre-surgical anesthesia review and clearance prior to him undergoing the above procedure. Patient is a Former Smoker (20 pack years; quit 08/1983). Pertinent PMH includes: CAD, STEMI, HTN, HLD, T2DM, CKD-III, GERD (no daily Tx), hiatal hernia, OSAH (requires nocturnal PAP therapy), OA, mild cognitive impairment/memory loss.  Patient is followed by cardiology Saralyn Pilar, MD). He was last seen in the cardiology clinic on 08/11/2021; notes reviewed.  At the time of his clinic visit, patient reported to be "doing good" from a cardiovascular perspective.  He denied any episodes of chest pain, however patient was experiencing exertional dyspnea.  No PND, orthopnea, palpitations, vertiginous  symptoms, or presyncope/syncope.  Patient with intermittent peripheral edema that was reported to be mild and at baseline.  Patient's past medical history significant for cardiovascular diagnoses.  Of note, complete cardiac history unknown, as patient moved from [place] to [facility] and complete medical history not available for review at time of consult.  Information obtained from patient and local cardiologist's notes.  Patient suffered a STEMI on 07/27/2020.  Subsequent PCI was performed placing a 4.0 x 18 mm resolute Onyx DES to an unknown vessel.  Patient underwent staged PCI on 07/29/2020 whereby a 3.5 x 38 mm resolute Onyx stent was placed to an unspecified vessel.  Following MI and stent placement, patient remains on daily DAPT therapy using both ASA and ticagrelor; compliant with therapy with no evidence or reports of GI bleeding.  Patient has SL NTG to use on a as needed basis for recurrent angina, however denied recent use. Blood pressure well controlled at 122/78 on currently prescribed ARB and beta-blocker therapies.  Patient is on a statin + omega-3 fatty acid for his HLD and further ASCVD prevention.  T2DM loosely controlled on currently prescribed regimen; last HgbA1c was 8.3% when checked on 06/24/2021.  Of note, patient is on an SGLT2i (empagliflozin) due to concurrent diabetes and cardiovascular diagnoses. Patient has an OSAH diagnosis and is reported to be compliant with prescribed nocturnal PAP therapy.  Patient is active and has a formal exercise regimen. Functional capacity, as defined by DASI, is documented as being >/= 4 METS.  No changes were made to his medication regimen.  Patient to follow-up with outpatient cardiology in 4 months or sooner if needed.  Carlos Mathis is scheduled for an elective ROBOTIC ASSISTED BILATERAL INGUINAL HERNIA REPAIR on 09/01/2021 with Dr. Herbert Pun, MD.  Given patient's past medical history significant for cardiovascular diagnoses,  presurgical  cardiac clearance was sought by the performing surgeon's office and PAT team. Per cardiology, "this patient is optimized for surgery and may proceed with the planned procedural course with a LOW risk of significant perioperative cardiovascular complications".  As previously mentioned, patient is on daily DAPT therapy.  He has been instructed on recommendations for holding his ASA for 5 days (last dose 08/26/2021) and his ticagrelor for 7 days (last dose 08/24/2021) prior to his procedure with plans to restart as soon as postoperative bleeding risk felt to be minimized by primary attending surgeon.  Patient denies previous perioperative complications with anesthesia in the past. In review of the EMR, there are no records available for review regarding patient's past surgical/anesthetic courses within the [facility] system.  Vitals with BMI 07/31/2021 07/31/2021 07/31/2021  Height - - '5\' 10"'$   Weight - - 181 lbs  BMI - - 38.25  Systolic 053 976 -  Diastolic 78 82 -  Pulse 68 64 -    Providers/Specialists:   NOTE: Primary physician provider listed below. Patient may have been seen by APP or partner within same practice.   PROVIDER ROLE / SPECIALTY LAST Tanna Savoy, MD General Surgery (Surgeon) 08/17/2021  Kirk Ruths, MD Primary Care Provider 06/24/2021  [name], MD Cardiology 08/11/2021   Allergies:  Atorvastatin and Meperidine hcl  Current Home Medications:   No current facility-administered medications for this encounter.    aspirin 81 MG EC tablet   atorvastatin (LIPITOR) 40 MG tablet   empagliflozin (JARDIANCE) 25 MG TABS tablet   losartan (COZAAR) 25 MG tablet   metFORMIN (GLUMETZA) 500 MG (MOD) 24 hr tablet   metoprolol succinate (TOPROL-XL) 50 MG 24 hr tablet   Multiple Vitamin (MULTIVITAMIN) capsule   nitroGLYCERIN (NITROSTAT) 0.4 MG SL tablet   Omega-3 Fatty Acids (OMEGA-3 FISH OIL) 1200 MG CAPS   OVER THE COUNTER MEDICATION   tamsulosin  (FLOMAX) 0.4 MG CAPS capsule   ticagrelor (BRILINTA) 90 MG TABS tablet   Apoaequorin (PREVAGEN) 10 MG CAPS   glucose blood ([employer] BLOOD GLUCOSE TEST) test strip   glucose blood test strip   meclizine (ANTIVERT) 25 MG tablet   History:   Past Medical History:  Diagnosis Date   Arthritis    BPH (benign prostatic hyperplasia)    CKD (chronic kidney disease), stage III (HCC)    Coronary artery disease    GERD (gastroesophageal reflux disease)    History of hiatal hernia    HLD (hyperlipidemia)    Hypertension    Long term current use of antithrombotics/antiplatelets    a.) DAPT therapy (ASA+ ticagrelor)   MCI (mild cognitive impairment) with memory loss    a.) takes apoaequorin   Migraines    OSA on CPAP    Pneumonia    Skin cancer, basal cell    STEMI (ST elevation myocardial infarction) ([place]) 07/27/2020   a.) MI while living in [place]; underwent PCI placing 4.0 x 18 mm and 3.5 x 38 mm Resolute Onyx DES (vessels unknown/unspecified).   T2DM (type 2 diabetes mellitus) ([place])    Past Surgical History:  Procedure Laterality Date   CARDIAC CATHETERIZATION     CATARACT EXTRACTION Right    HERNIA REPAIR Left    inguinal   JOINT REPLACEMENT     tka   UMBILICAL HERNIA REPAIR     No family history on file. Social History   Tobacco Use   Smoking status: Former    Packs/day: 1.00  Years: 20.00    Pack years: 20.00    Types: Cigarettes    Quit date: 09/11/1983    Years since quitting: 37.9   Smokeless tobacco: Former    Types: Chew    Quit date: 09/11/1983  Vaping Use   Vaping Use: Never used  Substance Use Topics   Alcohol use: Yes   Drug use: Not Currently    Pertinent Clinical Results:  LABS: Labs reviewed: Acceptable for surgery.  No visits with results within 3 Day(s) from this visit.  Latest known visit with results is:  Admission on 07/31/2021, Discharged on 07/31/2021  Component Date Value Ref Range Status   Sodium 07/31/2021 139  135 - 145 mmol/L Final    Potassium 07/31/2021 4.3  3.5 - 5.1 mmol/L Final   Chloride 07/31/2021 107  98 - 111 mmol/L Final   CO2 07/31/2021 26  22 - 32 mmol/L Final   Glucose, Bld 07/31/2021 154 (H)  70 - 99 mg/dL Final   Glucose reference range applies only to samples taken after fasting for at least 8 hours.   BUN 07/31/2021 20  8 - 23 mg/dL Final   Creatinine, Ser 07/31/2021 1.06  0.61 - 1.24 mg/dL Final   Calcium 07/31/2021 9.3  8.9 - 10.3 mg/dL Final   GFR, Estimated 07/31/2021 >60  >60 mL/min Final   Comment: (NOTE) Calculated using the CKD-EPI Creatinine Equation (2021)    Anion gap 07/31/2021 6  5 - 15 Final   Performed at [facility], [address] [place], [place] [address]   WBC 07/31/2021 7.0  4.0 - 10.5 K/uL Final   RBC 07/31/2021 4.87  4.22 - 5.81 MIL/uL Final   Hemoglobin 07/31/2021 16.0  13.0 - 17.0 g/dL Final   HCT 07/31/2021 48.4  39.0 - 52.0 % Final   MCV 07/31/2021 99.4  80.0 - 100.0 fL Final   MCH 07/31/2021 32.9  26.0 - 34.0 pg Final   MCHC 07/31/2021 33.1  30.0 - 36.0 g/dL Final   RDW 07/31/2021 12.2  11.5 - 15.5 % Final   Platelets 07/31/2021 172  150 - 400 K/uL Final   nRBC 07/31/2021 0.0  0.0 - 0.2 % Final   Performed at [facility], [address] [place], [place] [address]    ECG: Date: 07/31/2021 Time ECG obtained: 0940 AM Rate: 60 bpm Rhythm:  Sinus rhythm with first-degree AV block Axis (leads I and aVF): Normal Intervals: PR 224 ms. QRS 88 ms. QTc 432 ms. ST segment and T wave changes: No evidence of acute ST segment elevation or depression.  Evidence of an age undetermined anterior infarct present. Comparison: Similar to previous tracing obtained on 09/02/2020   IMAGING / PROCEDURES: -No pertinent imaging available for review.  Impression and Plan:  Keonta Alsip has been referred for pre-anesthesia review and clearance prior to him undergoing the planned anesthetic and procedural courses. Available labs, pertinent testing, and imaging  results were personally reviewed by me. This patient has been appropriately cleared by cardiology with an overall LOW risk of significant perioperative cardiovascular complications.  Based on clinical review performed today (08/26/21), barring any significant acute changes in the patient's overall condition, it is anticipated that he will be able to proceed with the planned surgical intervention. Any acute changes in clinical condition may necessitate his procedure being postponed and/or cancelled. Patient will meet with anesthesia team (MD and/or CRNA) on the day of his procedure for preoperative evaluation/assessment. Questions regarding anesthetic course will be fielded  at that time.   Pre-surgical instructions were reviewed with the patient during his PAT appointment and questions were fielded by PAT clinical staff. Patient was advised that if any questions or concerns arise prior to his procedure then he should return a call to PAT and/or his surgeon's office to discuss.  Honor Loh, MSN, APRN, FNP-C, CEN [facility]  Peri-operative Services Nurse Practitioner Phone: [phone] Fax: [phone] 08/26/21 1:54 PM  NOTE: This note has been prepared using Dragon dictation software. Despite my best ability to proofread, there is always the potential that unintentional transcriptional errors may still occur from this process.

## 2021-08-31 MED ORDER — FAMOTIDINE 20 MG PO TABS: 20.0000 mg | ORAL_TABLET | Freq: Once | ORAL | Status: AC

## 2021-08-31 MED ORDER — ORAL CARE MOUTH RINSE: 15.0000 mL | Freq: Once | OROMUCOSAL | Status: AC

## 2021-08-31 MED ORDER — CEFAZOLIN SODIUM-DEXTROSE 2-4 GM/100ML-% IV SOLN
2.0000 g | INTRAVENOUS | Status: AC
  Administered 2021-09-01: 2 g via INTRAVENOUS

## 2021-08-31 MED ORDER — CHLORHEXIDINE GLUCONATE 0.12 % MT SOLN: 15.0000 mL | Freq: Once | OROMUCOSAL | Status: AC

## 2021-08-31 MED ORDER — SODIUM CHLORIDE 0.9 % IV SOLN: INTRAVENOUS | Status: DC

## 2021-09-01 ENCOUNTER — Ambulatory Visit: Payer: Medicare Other | Admitting: Urgent Care

## 2021-09-01 ENCOUNTER — Encounter
Admission: RE | Disposition: A | Discharge status: Home / Self Care | Payer: Self-pay | Source: Home / Self Care | Attending: General Surgery

## 2021-09-01 ENCOUNTER — Other Ambulatory Visit: Payer: Self-pay

## 2021-09-01 ENCOUNTER — Encounter: Payer: Self-pay | Admitting: General Surgery

## 2021-09-01 ENCOUNTER — Ambulatory Visit
Admission: RE | Admit: 2021-09-01 | Discharge: 2021-09-01 | Disposition: A | Discharge status: Home / Self Care | Payer: Medicare Other | Attending: General Surgery | Admitting: General Surgery

## 2021-09-01 DIAGNOSIS — Z955 Presence of coronary angioplasty implant and graft: Secondary | ICD-10-CM | POA: Insufficient documentation

## 2021-09-01 DIAGNOSIS — I252 Old myocardial infarction: Secondary | ICD-10-CM | POA: Insufficient documentation

## 2021-09-01 DIAGNOSIS — N183 Chronic kidney disease, stage 3 unspecified: Secondary | ICD-10-CM | POA: Diagnosis not present

## 2021-09-01 DIAGNOSIS — E7800 Pure hypercholesterolemia, unspecified: Secondary | ICD-10-CM | POA: Diagnosis not present

## 2021-09-01 DIAGNOSIS — Z7982 Long term (current) use of aspirin: Secondary | ICD-10-CM | POA: Insufficient documentation

## 2021-09-01 DIAGNOSIS — E1122 Type 2 diabetes mellitus with diabetic chronic kidney disease: Secondary | ICD-10-CM | POA: Diagnosis not present

## 2021-09-01 DIAGNOSIS — I129 Hypertensive chronic kidney disease with stage 1 through stage 4 chronic kidney disease, or unspecified chronic kidney disease: Secondary | ICD-10-CM | POA: Insufficient documentation

## 2021-09-01 DIAGNOSIS — I2510 Atherosclerotic heart disease of native coronary artery without angina pectoris: Secondary | ICD-10-CM | POA: Diagnosis not present

## 2021-09-01 DIAGNOSIS — Z7902 Long term (current) use of antithrombotics/antiplatelets: Secondary | ICD-10-CM | POA: Diagnosis not present

## 2021-09-01 DIAGNOSIS — E1142 Type 2 diabetes mellitus with diabetic polyneuropathy: Secondary | ICD-10-CM | POA: Insufficient documentation

## 2021-09-01 DIAGNOSIS — K4020 Bilateral inguinal hernia, without obstruction or gangrene, not specified as recurrent: Secondary | ICD-10-CM | POA: Insufficient documentation

## 2021-09-01 HISTORY — DX: Mild cognitive impairment of uncertain or unknown etiology: G31.84

## 2021-09-01 HISTORY — DX: Obstructive sleep apnea (adult) (pediatric): G47.33

## 2021-09-01 HISTORY — DX: Hyperlipidemia, unspecified: E78.5

## 2021-09-01 HISTORY — DX: Basal cell carcinoma of skin, unspecified: C44.91

## 2021-09-01 HISTORY — DX: Type 2 diabetes mellitus without complications: E11.9

## 2021-09-01 HISTORY — DX: Chronic kidney disease, stage 3 unspecified: N18.30

## 2021-09-01 HISTORY — DX: Benign prostatic hyperplasia without lower urinary tract symptoms: N40.0

## 2021-09-01 HISTORY — DX: Migraine, unspecified, not intractable, without status migrainosus: G43.909

## 2021-09-01 HISTORY — DX: Long term (current) use of antithrombotics/antiplatelets: Z79.02

## 2021-09-01 HISTORY — PX: INSERTION OF MESH: SHX5868

## 2021-09-01 LAB — GLUCOSE, CAPILLARY
Glucose-Capillary: 120 mg/dL — ABNORMAL HIGH (ref 70–99)
Glucose-Capillary: 167 mg/dL — ABNORMAL HIGH (ref 70–99)

## 2021-09-01 SURGERY — REPAIR, HERNIA, INGUINAL, BILATERAL, ROBOT-ASSISTED
Anesthesia: General | Site: Groin | Laterality: Right

## 2021-09-01 MED ORDER — DEXAMETHASONE SODIUM PHOSPHATE 10 MG/ML IJ SOLN
INTRAMUSCULAR | Status: AC
  Filled 2021-09-01: qty 1

## 2021-09-01 MED ORDER — HYDROCODONE-ACETAMINOPHEN 5-325 MG PO TABS: 1.0000 | ORAL_TABLET | ORAL | 0 refills | Status: AC | PRN

## 2021-09-01 MED ORDER — ROCURONIUM BROMIDE 100 MG/10ML IV SOLN
INTRAVENOUS | Status: DC | PRN
  Administered 2021-09-01: 50 mg via INTRAVENOUS
  Administered 2021-09-01: 20 mg via INTRAVENOUS

## 2021-09-01 MED ORDER — FENTANYL CITRATE (PF) 100 MCG/2ML IJ SOLN
INTRAMUSCULAR | Status: AC
  Filled 2021-09-01: qty 2

## 2021-09-01 MED ORDER — ACETAMINOPHEN 10 MG/ML IV SOLN
INTRAVENOUS | Status: AC
  Filled 2021-09-01: qty 100

## 2021-09-01 MED ORDER — PROPOFOL 10 MG/ML IV BOLUS
INTRAVENOUS | Status: DC | PRN
  Administered 2021-09-01: 100 mg via INTRAVENOUS

## 2021-09-01 MED ORDER — BUPIVACAINE-EPINEPHRINE 0.25% -1:200000 IJ SOLN
INTRAMUSCULAR | Status: DC | PRN
  Administered 2021-09-01: 30 mL

## 2021-09-01 MED ORDER — OXYCODONE HCL 5 MG PO TABS
ORAL_TABLET | ORAL | Status: AC
  Filled 2021-09-01: qty 1

## 2021-09-01 MED ORDER — LIDOCAINE HCL (CARDIAC) PF 100 MG/5ML IV SOSY
PREFILLED_SYRINGE | INTRAVENOUS | Status: DC | PRN
Start: 2021-09-01 — End: 2021-09-01
  Administered 2021-09-01: 80 mg via INTRAVENOUS

## 2021-09-01 MED ORDER — FAMOTIDINE 20 MG PO TABS
ORAL_TABLET | ORAL | Status: AC
  Administered 2021-09-01: 20 mg via ORAL
  Filled 2021-09-01: qty 1

## 2021-09-01 MED ORDER — CEFAZOLIN SODIUM-DEXTROSE 2-4 GM/100ML-% IV SOLN
INTRAVENOUS | Status: AC
  Filled 2021-09-01: qty 100

## 2021-09-01 MED ORDER — ONDANSETRON HCL 4 MG/2ML IJ SOLN
INTRAMUSCULAR | Status: AC
  Filled 2021-09-01: qty 2

## 2021-09-01 MED ORDER — GLYCOPYRROLATE 0.2 MG/ML IJ SOLN
INTRAMUSCULAR | Status: AC
  Filled 2021-09-01: qty 1

## 2021-09-01 MED ORDER — SUGAMMADEX SODIUM 200 MG/2ML IV SOLN
INTRAVENOUS | Status: DC | PRN
Start: 2021-09-01 — End: 2021-09-01
  Administered 2021-09-01: 200 mg via INTRAVENOUS

## 2021-09-01 MED ORDER — ROCURONIUM BROMIDE 10 MG/ML (PF) SYRINGE
PREFILLED_SYRINGE | INTRAVENOUS | Status: AC
  Filled 2021-09-01: qty 10

## 2021-09-01 MED ORDER — ONDANSETRON HCL 4 MG/2ML IJ SOLN
INTRAMUSCULAR | Status: DC | PRN
  Administered 2021-09-01: 4 mg via INTRAVENOUS

## 2021-09-01 MED ORDER — DEXAMETHASONE SODIUM PHOSPHATE 10 MG/ML IJ SOLN
INTRAMUSCULAR | Status: DC | PRN
  Administered 2021-09-01: 8 mg via INTRAVENOUS

## 2021-09-01 MED ORDER — SEVOFLURANE IN SOLN
RESPIRATORY_TRACT | Status: AC
  Filled 2021-09-01: qty 250

## 2021-09-01 MED ORDER — CHLORHEXIDINE GLUCONATE 0.12 % MT SOLN
OROMUCOSAL | Status: AC
  Administered 2021-09-01: 15 mL via OROMUCOSAL
  Filled 2021-09-01: qty 15

## 2021-09-01 MED ORDER — BUPIVACAINE-EPINEPHRINE (PF) 0.25% -1:200000 IJ SOLN
INTRAMUSCULAR | Status: AC
  Filled 2021-09-01: qty 30

## 2021-09-01 MED ORDER — METOPROLOL TARTRATE 5 MG/5ML IV SOLN
INTRAVENOUS | Status: AC
  Filled 2021-09-01: qty 5

## 2021-09-01 MED ORDER — FENTANYL CITRATE (PF) 100 MCG/2ML IJ SOLN
INTRAMUSCULAR | Status: DC | PRN
  Administered 2021-09-01 (×2): 50 ug via INTRAVENOUS

## 2021-09-01 MED ORDER — OXYCODONE HCL 5 MG PO TABS
5.0000 mg | ORAL_TABLET | Freq: Once | ORAL | Status: AC
  Administered 2021-09-01: 5 mg via ORAL

## 2021-09-01 MED ORDER — GLYCOPYRROLATE 0.2 MG/ML IJ SOLN
INTRAMUSCULAR | Status: DC | PRN
  Administered 2021-09-01: .2 mg via INTRAVENOUS

## 2021-09-01 MED ORDER — ACETAMINOPHEN 10 MG/ML IV SOLN
INTRAVENOUS | Status: DC | PRN
  Administered 2021-09-01: 1000 mg via INTRAVENOUS

## 2021-09-01 MED ORDER — EPHEDRINE 5 MG/ML INJ
INTRAVENOUS | Status: AC
  Filled 2021-09-01: qty 5

## 2021-09-01 MED ORDER — METOPROLOL TARTRATE 5 MG/5ML IV SOLN
INTRAVENOUS | Status: DC | PRN
  Administered 2021-09-01: 2 mg via INTRAVENOUS

## 2021-09-01 MED ORDER — EPHEDRINE SULFATE (PRESSORS) 50 MG/ML IJ SOLN
INTRAMUSCULAR | Status: DC | PRN
  Administered 2021-09-01: 5 mg via INTRAVENOUS

## 2021-09-01 SURGICAL SUPPLY — 49 items
ADH SKN CLS APL DERMABOND .7 (GAUZE/BANDAGES/DRESSINGS) ×2
BAG INFUSER PRESSURE 100CC (MISCELLANEOUS) IMPLANT
BLADE SURG SZ11 CARB STEEL (BLADE) ×3 IMPLANT
COVER TIP SHEARS 8 DVNC (MISCELLANEOUS) ×2 IMPLANT
COVER TIP SHEARS 8MM DA VINCI (MISCELLANEOUS) ×1
COVER WAND RF STERILE (DRAPES) ×3 IMPLANT
DERMABOND ADVANCED (GAUZE/BANDAGES/DRESSINGS) ×1
DERMABOND ADVANCED .7 DNX12 (GAUZE/BANDAGES/DRESSINGS) ×2 IMPLANT
DRAPE ARM DVNC X/XI (DISPOSABLE) ×6 IMPLANT
DRAPE COLUMN DVNC XI (DISPOSABLE) ×2 IMPLANT
DRAPE DA VINCI XI ARM (DISPOSABLE) ×3
DRAPE DA VINCI XI COLUMN (DISPOSABLE) ×1
ELECT REM PT RETURN 9FT ADLT (ELECTROSURGICAL) ×3
ELECTRODE REM PT RTRN 9FT ADLT (ELECTROSURGICAL) ×2 IMPLANT
GLOVE SURG ENC MOIS LTX SZ6.5 (GLOVE) ×8 IMPLANT
GLOVE SURG UNDER POLY LF SZ6.5 (GLOVE) ×8 IMPLANT
GOWN STRL REUS W/ TWL LRG LVL3 (GOWN DISPOSABLE) ×6 IMPLANT
GOWN STRL REUS W/TWL LRG LVL3 (GOWN DISPOSABLE) ×12
IRRIGATOR SUCT 8 DISP DVNC XI (IRRIGATION / IRRIGATOR) IMPLANT
IRRIGATOR SUCTION 8MM XI DISP (IRRIGATION / IRRIGATOR)
IV CATH ANGIO 12GX3 LT BLUE (NEEDLE) IMPLANT
IV NS 1000ML (IV SOLUTION)
IV NS 1000ML BAXH (IV SOLUTION) IMPLANT
KIT PINK PAD W/HEAD ARE REST (MISCELLANEOUS) ×3
KIT PINK PAD W/HEAD ARM REST (MISCELLANEOUS) ×2 IMPLANT
LABEL OR SOLS (LABEL) IMPLANT
MANIFOLD NEPTUNE II (INSTRUMENTS) ×3 IMPLANT
MESH 3DMAX 5X7 RT XLRG (Mesh General) ×1 IMPLANT
MESH 3DMAX MID 5X7 RT XLRG (Mesh General) IMPLANT
NDL INSUFFLATION 14GA 120MM (NEEDLE) ×2 IMPLANT
NEEDLE HYPO 22GX1.5 SAFETY (NEEDLE) ×3 IMPLANT
NEEDLE INSUFFLATION 14GA 120MM (NEEDLE) ×3 IMPLANT
OBTURATOR OPTICAL STANDARD 8MM (TROCAR) ×1
OBTURATOR OPTICAL STND 8 DVNC (TROCAR) ×2
OBTURATOR OPTICALSTD 8 DVNC (TROCAR) ×2 IMPLANT
PACK LAP CHOLECYSTECTOMY (MISCELLANEOUS) ×3 IMPLANT
SEAL CANN UNIV 5-8 DVNC XI (MISCELLANEOUS) ×6 IMPLANT
SEAL XI 5MM-8MM UNIVERSAL (MISCELLANEOUS) ×3
SET TUBE SMOKE EVAC HIGH FLOW (TUBING) ×3 IMPLANT
SOLUTION ELECTROLUBE (MISCELLANEOUS) ×3 IMPLANT
SUT MNCRL 4-0 (SUTURE) ×6
SUT MNCRL 4-0 27XMFL (SUTURE) ×4
SUT VIC AB 2-0 SH 27 (SUTURE) ×3
SUT VIC AB 2-0 SH 27XBRD (SUTURE) ×2 IMPLANT
SUT VLOC 90 S/L VL9 GS22 (SUTURE) ×3 IMPLANT
SUTURE MNCRL 4-0 27XMF (SUTURE) ×2 IMPLANT
TAPE TRANSPORE STRL 2 31045 (GAUZE/BANDAGES/DRESSINGS) IMPLANT
TRAY FOLEY MTR SLVR 16FR STAT (SET/KITS/TRAYS/PACK) ×2 IMPLANT
WATER STERILE IRR 500ML POUR (IV SOLUTION) ×2 IMPLANT

## 2021-09-01 NOTE — H&P (Signed)
PATIENT PROFILE: ?Carlos Mathis is a 81 y.o. male who presents to the Clinic for consultation at the request of Dr. Ouida Sills for evaluation of right inguinal hernia. ? ?PCP: Harrold Donath, MD ? ?HISTORY OF PRESENT ILLNESS: ?Carlos Mathis reports feeling a right inguinal hernia since 8 months ago. He endorses that he was doing heavy lifting and he felt a lump in the right groin. He has been having discomfort and intermittent pain in the right groin. No pain radiation. Aggravating factor is doing heavy lifting. Alleviating factor is reducing the hernia while laying down. There is no pain radiation. Denies any episode of abdominal distention nausea or vomiting. ? 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 He has been on dual antiplatelet therapy for a year. He was evaluated by his cardiology at the [facility] and recommendation was to stop the Brilinta. He denies chest pain or shortness of breath. ? ?PROBLEM LIST: ?Problem List Date Reviewed: 06/24/2021  ?Noted  ?Preop cardiovascular exam 08/11/2021  ?Healthcare maintenance 06/24/2021  ?Overview  ?MEDICARE WELLNESS VISIT ? ?PROVIDERS RENDERING CARE ?Dr. Ouida Sills, Dr. Josefa Half, [place] ? ?FUNCTIONAL ASSESSMENT  ?(1) Hearing: Demonstrates normal hearing in conversation.  ?(2) Risk of Falls: No reports of falls or abnormal balance. Gait is observed to be good upon observation.  ?(3) Home Safety; Home is safe and secure ?(4) Activities of Daily Living; Household chores and grooming are managed without problems. Personal finances are managed without problems.  ? ?DEPRESSION SCREENING ?There does not seem to be loss of interest in activities nor excess crying or changes in sleep or appetite.  ? ?COGNITIVE SCREENING ?Orientation is appropriate as are responses to questions and general conversation. No reports of forgetfulness or losing things.  ? ?PREVENTION PLAN ?Aggressive screening is not age appropriate ?Glaucoma: Eye exam yearly ?Pneumonia: Unclear  vaccination status ?Shingles: Shingrix approximately 2017 at the [place] ?Influenza: Flu vaccine yearly  ?Smoking Cessation ? ?[place] ?Activity as tolerated ? ?END OF LIFE CARE WANTS ?Full code  ? ? ? ? ?Frazier Richards MD ? ? ? ? ? ?Diabetic polyneuropathy associated with type 2 diabetes mellitus (CMS-HCC) 06/24/2021  ?CAD (coronary artery disease) 09/02/2020  ?Overview  ?4.0 x 18 mm Resolute onyx DES 07/23/2020 on 07/27/2020, staged 3.5 x 38 mm Resolute Onyx 07/29/2020, [place], [place], History STEMI ? ? ?Essential hypertension 09/02/2020  ?Pure hypercholesterolemia 09/02/2020  ?Type 2 diabetes mellitus with stage 3a chronic kidney disease (CMS-HCC) 09/02/2020  ? ?GENERAL REVIEW OF SYSTEMS:  ? ?General ROS: negative for - chills, fatigue, fever, weight gain or weight loss ?Allergy and Immunology ROS: negative for - hives  ?Hematological and Lymphatic ROS: negative for - bleeding problems or bruising, negative for palpable nodes ?Endocrine ROS: negative for - heat or cold intolerance, hair changes ?Respiratory ROS: negative for - cough, shortness of breath or wheezing ?Cardiovascular ROS: no chest pain or palpitations ?GI ROS: negative for nausea, vomiting, abdominal pain, diarrhea, constipation ?Musculoskeletal ROS: negative for - joint swelling or muscle pain ?Neurological ROS: negative for - confusion, syncope ?Dermatological ROS: negative for pruritus and rash ?Psychiatric: negative for anxiety, depression, difficulty sleeping and memory loss ? ?MEDICATIONS: ?Current Outpatient Medications  ?Medication Sig Dispense Refill  ? apoaequorin (PREVAGEN) capsule Take 1 capsule by mouth once daily  ? aspirin 81 MG EC tablet Take 81 mg by mouth once daily  ? atorvastatin (LIPITOR) 80 MG tablet Take 80 mg by mouth once daily  ? betamethasone dipropionate (DIPROSONE) 0.05 %  ointment Apply topically 2 (two) times daily as needed  ? blood glucose diagnostic (GLUCOSE BLOOD) test strip USE 1 STRIP FOR  TESTING AS DIRECTED EVERY DAY TO TEST BLOOD SUGAR  ? cetirizine (ZYRTEC) 10 MG tablet Take 10 mg by mouth once daily  ? empagliflozin (JARDIANCE) 25 mg tablet Take 12.5 mg by mouth once daily  ? gabapentin (NEURONTIN) 100 MG capsule Take 1 capsule (100 mg total) by mouth 3 (three) times daily 90 capsule 11  ? Herbal Supplement Take 1 capsule by mouth once daily Nerve Control 911-290 mg Passion Fruit Herb Powder, 220 mg Marshmallow Root Powder, 200 mg 4:1 Corydalis Lutea, 100 mg 20:1 Prickly Pear, 90 mg California Poppy Seed  ? losartan (COZAAR) 25 MG tablet Take 1 tablet by mouth once daily  ? meclizine (ANTIVERT) 25 mg tablet  ? metFORMIN (GLUMETZA) 500 MG (MOD) ER tablet Take 2 tablets (1,000 mg total) by mouth once daily (Patient taking differently: Take 500 mg by mouth once daily) 180 tablet 1  ? metoprolol succinate (TOPROL-XL) 50 MG XL tablet Take 25 mg by mouth once daily  ? multivitamin capsule Take 1 capsule by mouth once daily  ? nitroGLYcerin (NITROSTAT) 0.4 MG SL tablet Place 1 tablet under the tongue every 5 (five) minutes as needed  ? omega 3-dha-epa-fish oil 1,200 (144-216) mg Cap Take 1 tablet by mouth once daily  ? tamsulosin (FLOMAX) 0.4 mg capsule Take 1 capsule by mouth once daily  ? ticagrelor (BRILINTA) 90 mg tablet Take 1 tablet (90 mg total) by mouth 2 (two) times daily 60 tablet 3  ? ?No current facility-administered medications for this visit.  ? ?ALLERGIES: ?Patient has no known allergies. ? ?PAST MEDICAL HISTORY: ?Past Medical History:  ?Diagnosis Date  ? Diabetes (CMS-HCC)  ? History of myocardial infarction  ? Hypertension  ? ?PAST SURGICAL HISTORY: ?Past Surgical History:  ?Procedure Laterality Date  ? REPLACEMENT TOTAL KNEE Right 03/2013  ? CARDIAC CATHETERIZATION  ? ? ?FAMILY HISTORY: ?History reviewed. No pertinent family history.  ? ?SOCIAL HISTORY: ?Social History  ? ?Socioeconomic History  ? Marital status: Married  ?Tobacco Use  ? Smoking status: Never  ? Smokeless tobacco:  Never  ?Vaping Use  ? Vaping Use: Never used  ?Substance and Sexual Activity  ? Alcohol use: Never  ? Drug use: Never  ? Sexual activity: Yes  ? ?PHYSICAL EXAM: ?Vitals:  ?08/17/21 0921  ?BP: 116/65  ?Pulse: (!) 47  ? ?Body mass index is 26.98 kg/m?. ?Weight: 85.3 kg (188 lb)  ? ?GENERAL: Alert, active, oriented x3 ? ?HEENT: Pupils equal reactive to light. Extraocular movements are intact. Sclera clear. Palpebral conjunctiva normal red color.Pharynx clear. ? ?NECK: Supple with no palpable mass and no adenopathy. ? ?LUNGS: Sound clear with no rales rhonchi or wheezes. ? ?HEART: Regular rhythm S1 and S2 without murmur. ? ?ABDOMEN: Soft and depressible, nontender with no palpable mass, no hepatomegaly. Diastases recti. Large scar in the mid abdomen. Right inguinal hernia, reducible. ? ?EXTREMITIES: Well-developed well-nourished symmetrical with no dependent edema. ? ?NEUROLOGICAL: Awake alert oriented, facial expression symmetrical, moving all extremities. ? ?REVIEW OF DATA: ?I have reviewed the following data today: ?Initial consult on 06/24/2021  ?Component Date Value  ? Thyroid Stimulating Horm* 06/24/2021 1.777  ? Vitamin B12 06/24/2021 363  ? Cholesterol, Total 06/24/2021 125  ? Triglyceride 06/24/2021 159  ? HDL (High Density Lipopr* 06/24/2021 42.6  ? LDL Calculated 06/24/2021 51  ? VLDL Cholesterol 06/24/2021 32  ? Cholesterol/HDL Ratio 06/24/2021  2.9  ? Glucose 06/24/2021 146 (H)  ? Sodium 06/24/2021 142  ? Potassium 06/24/2021 4.7  ? Chloride 06/24/2021 106  ? Carbon Dioxide (CO2) 06/24/2021 31.3  ? Urea Nitrogen (BUN) 06/24/2021 23  ? Creatinine 06/24/2021 1.3  ? Glomerular Filtration Ra* 06/24/2021 53 (L)  ? Calcium 06/24/2021 9.6  ? AST 06/24/2021 17  ? ALT 06/24/2021 21  ? Alk Phos (alkaline Phosp* 06/24/2021 92  ? Albumin 06/24/2021 4.3  ? Bilirubin, Total 06/24/2021 0.7  ? Protein, Total 06/24/2021 6.3  ? A/G Ratio 06/24/2021 2.2  ? Hemoglobin A1C 06/24/2021 8.3 (H)  ? Average Blood Glucose (C*  06/24/2021 192  ? Creatinine, Random Urine 06/24/2021 59.2  ? Urine Albumin, Random 06/24/2021 8  ? Urine Albumin/Creatinine* 06/24/2021 13.5  ? ? ?ASSESSMENT: ?Carlos Mathis is a 81 y.o. male presenting for consultation

## 2021-09-01 NOTE — Op Note (Signed)
Preoperative diagnosis: Right inguinal hernia.  ? ?Postoperative diagnosis: Right inguinal hernia. ? ?Procedure: Robotic assisted Laparoscopic Transabdominal preperitoneal laparoscopic (TAPP) repair of right inguinal hernia. ? ?Anesthesia: GETA ? ?Surgeon: Dr. Windell Moment ? ?Wound Classification: Clean ? ?Indications:  Patient is a 81 y.o. male developed a symptomatic right inguinal hernia. Repair was indicated. ? ?Findings: ?1. Right indirect Inguinal hernia identified ?2. Vas deferens and cord structures identified and preserved ?3. Bard Extra Large 3D Max MID Anatomical mesh used for repair ?4. Adequate hemostasis.  ? ? ? ? ? ? ? ? ? ? ?Description of procedure: The patient was taken to the operating room and the correct side of surgery was verified. The patient was placed supine with arms tucked at the sides. After obtaining adequate anesthesia, the patient?s abdomen was prepped and draped in standard sterile fashion. The patient was placed in the Trendelenburg position. A time-out was completed verifying correct patient, procedure, site, positioning, and implant(s) and/or special equipment prior to beginning this procedure. A Veress needle was placed at the umbilicus and pneumoperitoneum created with insufflation of carbon dioxide to 15 mmHg. After the Veress needle was removed, an 8-mm trocar was placed on epigastric area and the 30? angled laparoscope inserted. Two 8-mm trocars were then placed lateral to the rectus sheath under direct visualization. Both inguinal regions were inspected and the median umbilical ligament, medial umbilical ligament, and lateral umbilical fold were identified.  The robotic arms were docked. The robotic scope was inserted and the pelvic area anatomy targeted.  ?The peritoneum was incised with scissors along a line 6 cm above the superior edge of the hernia defect, extending from the median umbilical ligament to the anterior superior iliac spine. The peritoneal flap was mobilized  inferiorly using blunt and sharp dissection. The inferior epigastric vessels were exposed and the pubic symphysis was identified. Cooper?s ligament was dissected to its junction with the iliac vein. The dissection was continued inferiorly to the iliopubic tract, with care taken to avoid injury to the femoral branch of the genitofemoral nerve and the lateral femoral cutaneous nerve. The cord structures were parietalized. The hernia was identified and reduced by gentle traction.  ?The hernia sac was noted mobilized from the cord structures and reduced into the peritoneal cavity.  ?An extra large piece of mesh was rolled longitudinally into a compact cylinder and passed through a trocar. The cylinder was placed along the inferior aspect of the working space and unrolled into place to completely cover the direct, indirect, and femoral spaces. The mesh was secured into place superiorly to the anterior abdominal wall and inferiorly and medially to Cooper?s ligament with absorbable sutures. Care was taken to avoid the inferolateral triangles containing the iliac vessels and genital nerves. The peritoneal flap was closed over the mesh and secured with suture in similar positions of safety. After ensuring adequate hemostasis, the trocars were removed and the pneumoperitoneum allowed to escape. The trocar incisions were closed using monocryl and skin adhesive dressings applied.  ?The patient tolerated the procedure well and was taken to the postanesthesia care unit in stable condition.  ? ?Specimen: None ? ?Complications: None ? ?Estimated Blood Loss: 5 mL ? ?

## 2021-09-01 NOTE — Discharge Instructions (Signed)

## 2021-09-01 NOTE — Anesthesia Preprocedure Evaluation (Signed)
Anesthesia Evaluation  ?Patient identified by MRN, date of birth, ID band ?Patient awake ? ? ? ?Reviewed: ?Allergy & Precautions, NPO status , Patient's Chart, lab work & pertinent test results ? ?Airway ?Mallampati: II ? ?TM Distance: >3 FB ?Neck ROM: Full ? ? ? Dental ? ?(+) Upper Dentures, Partial Lower ?  ?Pulmonary ?neg pulmonary ROS, sleep apnea and Continuous Positive Airway Pressure Ventilation , former smoker,  ?  ?Pulmonary exam normal ? ?+ decreased breath sounds ? ? ? ? ? Cardiovascular ?Exercise Tolerance: Good ?hypertension, Pt. on medications ?+ CAD, + Past MI and + Cardiac Stents  ?negative cardio ROS ?Normal cardiovascular exam ?Rhythm:Regular Rate:Normal ? ? ?  ?Neuro/Psych ? Headaches, negative neurological ROS ? negative psych ROS  ? GI/Hepatic ?negative GI ROS, Neg liver ROS,   ?Endo/Other  ?negative endocrine ROSdiabetes, Well Controlled, Type 2, Oral Hypoglycemic Agents ? Renal/GU ?  ? ?  ?Musculoskeletal ? ?(+) Arthritis ,  ? Abdominal ?(+) + obese,   ?Peds ?negative pediatric ROS ?(+)  Hematology ?negative hematology ROS ?(+)   ?Anesthesia Other Findings ?Past Medical History: ?No date: Arthritis ?No date: BPH (benign prostatic hyperplasia) ?No date: CKD (chronic kidney disease), stage III ([place]) ?No date: Coronary artery disease ?No date: GERD (gastroesophageal reflux disease) ?No date: History of hiatal hernia ?No date: HLD (hyperlipidemia) ?No date: Hypertension ?No date: Long term current use of antithrombotics/antiplatelets ?    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    Comment:  a.) MI while living in [place]; underwent PCI placing  ?             4.0 x 18 mm and 3.5 x 38 mm Resolute Onyx DES  (vessels  ?             unknown/unspecified). ?No date: T2DM (type 2 diabetes mellitus) ([place]) ? ?Past Surgical History: ?No date: CARDIAC CATHETERIZATION ?No date: CATARACT EXTRACTION; Right ?No date: HERNIA REPAIR; Left ?    Comment:  inguinal ?No date: JOINT REPLACEMENT ?    Comment:  tka ?No date: UMBILICAL HERNIA REPAIR ? ?BMI   ? Body Mass Index: 25.83 kg/m?  ?  ? ? Reproductive/Obstetrics ?negative OB ROS ? ?  ? ? ? ? ? ? ? ? ? ? ? ? ? ?  ?  ? ? ? ? ? ? ? ? ?Anesthesia Physical ?Anesthesia Plan ? ?ASA: 3 ? ?Anesthesia Plan: General  ? ?Post-op Pain Management:   ? ?Induction: Intravenous ? ?PONV Risk Score and Plan: Ondansetron, Dexamethasone, Midazolam and Treatment may vary due to age or medical condition ? ?Airway Management Planned: Oral ETT ? ?Additional Equipment:  ? ?Intra-op Plan:  ? ?Post-operative Plan: Extubation in OR ? ?Informed Consent: I have reviewed the patients History and Physical, chart, labs and discussed the procedure including the risks, benefits and alternatives for the proposed anesthesia with the patient or authorized representative who has indicated his/her understanding and acceptance.  ? ? ? ?Dental Advisory Given ? ?Plan Discussed with: CRNA and Surgeon ? ?Anesthesia Plan Comments:   ? ? ? ? ? ? ?Anesthesia Quick Evaluation ? ?

## 2021-09-01 NOTE — Anesthesia Postprocedure Evaluation (Signed)
Anesthesia Post Note ? ?Patient: Carlos Mathis ? ?Procedure(s) Performed: XI ROBOTIC ASSISTED RIGHT INGUINAL HERNIA (Right: Groin) ?INSERTION OF MESH ? ?Patient location during evaluation: PACU ?Anesthesia Type: General ?Level of consciousness: awake and alert ?Pain management: satisfactory to patient ?Vital Signs Assessment: post-procedure vital signs reviewed and stable ?Respiratory status: respiratory function stable ?Cardiovascular status: stable ?Anesthetic complications: no ? ? ?No notable events documented. ? ? ?Last Vitals:  ?Vitals:  ? 09/01/21 1350 09/01/21 1355  ?BP: 110/69   ?Pulse: 78 73  ?Resp: 16 20  ?Temp:    ?SpO2: 92% 90%  ?  ?Last Pain:  ?Vitals:  ? 09/01/21 1345  ?TempSrc:   ?PainSc: 0-No pain  ? ? ?  ?  ?  ?  ?  ?  ? ?VAN STAVEREN,Jolonda Gomm ? ? ? ? ?

## 2021-09-01 NOTE — Transfer of Care (Signed)
Immediate Anesthesia Transfer of Care Note ? ?Patient: Carlos Mathis ? ?Procedure(s) Performed: XI ROBOTIC ASSISTED RIGHT INGUINAL HERNIA (Right: Groin) ?INSERTION OF MESH ? ?Patient Location: PACU ? ?Anesthesia Type:General ? ?Level of Consciousness: sedated ? ?Airway & Oxygen Therapy: Patient Spontanous Breathing and Patient connected to face mask oxygen ? ?Post-op Assessment: Report given to RN and Post -op Vital signs reviewed and stable ? ?Post vital signs: Reviewed ? ?Last Vitals:  ?Vitals Value Taken Time  ?BP 109/74 09/01/21 1317  ?Temp    ?Pulse 81 09/01/21 1318  ?Resp 29 09/01/21 1318  ?SpO2 97 % 09/01/21 1318  ?Vitals shown include unvalidated device data. ? ?Last Pain:  ?Vitals:  ? 09/01/21 0943  ?TempSrc: Oral  ?PainSc: 0-No pain  ?   ? ?  ? ?Complications: No notable events documented. ?

## 2021-09-01 NOTE — Anesthesia Procedure Notes (Signed)
Procedure Name: Intubation ?Date/Time: 09/01/2021 11:41 AM ?Performed by: Rolla Plate, CRNA ?Pre-anesthesia Checklist: Patient identified, Patient being monitored, Timeout performed, Emergency Drugs available and Suction available ?Patient Re-evaluated:Patient Re-evaluated prior to induction ?Oxygen Delivery Method: Circle system utilized ?Preoxygenation: Pre-oxygenation with 100% oxygen ?Induction Type: IV induction ?Ventilation: Mask ventilation without difficulty ?Laryngoscope Size: Mac and 3 ?Grade View: Grade I ?Tube type: Oral ?Tube size: 7.5 mm ?Number of attempts: 1 ?Airway Equipment and Method: Stylet ?Placement Confirmation: ETT inserted through vocal cords under direct vision, positive ETCO2 and breath sounds checked- equal and bilateral ?Secured at: 22 cm ?Tube secured with: Tape ?Dental Injury: Teeth and Oropharynx as per pre-operative assessment  ? ? ? ? ?

## 2021-09-02 ENCOUNTER — Encounter: Payer: Self-pay | Admitting: General Surgery

## 2021-10-27 ENCOUNTER — Other Ambulatory Visit: Payer: Self-pay | Admitting: Neurology

## 2021-10-27 DIAGNOSIS — G3184 Mild cognitive impairment, so stated: Secondary | ICD-10-CM

## 2021-11-11 ENCOUNTER — Ambulatory Visit
Admission: RE | Admit: 2021-11-11 | Discharge: 2021-11-11 | Disposition: A | Discharge status: Home / Self Care | Payer: Medicare Other | Source: Ambulatory Visit | Attending: Neurology | Admitting: Neurology

## 2021-11-11 DIAGNOSIS — G3184 Mild cognitive impairment, so stated: Secondary | ICD-10-CM | POA: Diagnosis present

## 2022-02-16 ENCOUNTER — Ambulatory Visit: Payer: Medicare Other | Attending: Neurology

## 2022-02-16 DIAGNOSIS — G20 Parkinson's disease: Secondary | ICD-10-CM | POA: Diagnosis present

## 2022-02-16 DIAGNOSIS — R2681 Unsteadiness on feet: Secondary | ICD-10-CM | POA: Diagnosis present

## 2022-02-16 DIAGNOSIS — R278 Other lack of coordination: Secondary | ICD-10-CM | POA: Insufficient documentation

## 2022-02-16 NOTE — Therapy (Unsigned)
OUTPATIENT OCCUPATIONAL THERAPY NEURO/LSVT BIG EVALUATION  Patient Name: Carlos Mathis MRN: [id number] DOB:1941-02-19, 81 y.o., male Today's Date: 02/16/2022  PCP: Dr. Frazier Richards REFERRING PROVIDER: Dr. Jennings Books   OT End of Session - 02/16/22 1935     Visit Number 1    Number of Visits 17    Date for OT Re-Evaluation 05/11/22    OT Start Time 0830    OT Stop Time 0930    OT Time Calculation (min) 60 min    Activity Tolerance Patient tolerated treatment well    Behavior During Therapy WFL for tasks assessed/performed             Past Medical History:  Diagnosis Date   Arthritis    BPH (benign prostatic hyperplasia)    CKD (chronic kidney disease), stage III (HCC)    Coronary artery disease    GERD (gastroesophageal reflux disease)    History of hiatal hernia    HLD (hyperlipidemia)    Hypertension    Long term current use of antithrombotics/antiplatelets    a.) DAPT therapy (ASA+ ticagrelor)   MCI (mild cognitive impairment) with memory loss    a.) takes apoaequorin   Migraines    OSA on CPAP    Pneumonia    Skin cancer, basal cell    STEMI (ST elevation myocardial infarction) ([place]) 07/27/2020   a.) MI while living in [place]; underwent PCI placing 4.0 x 18 mm and 3.5 x 38 mm Resolute Onyx DES (vessels unknown/unspecified).   T2DM (type 2 diabetes mellitus) ([place])    Past Surgical History:  Procedure Laterality Date   CARDIAC CATHETERIZATION     CATARACT EXTRACTION Right    HERNIA REPAIR Left    inguinal   INSERTION OF MESH  09/01/2021   Procedure: INSERTION OF MESH;  Surgeon: Herbert Pun, MD;  Location: [facility] ORS;  Service: General;;   JOINT REPLACEMENT     tka   UMBILICAL HERNIA REPAIR     There are no problems to display for this patient.   ONSET DATE: 01/31/22 (date of referral)  REFERRING DIAG: Parkinson's Disease  THERAPY DIAG:  Parkinson's disease ([place])  Other lack of coordination  Unsteadiness on feet  Rationale for  Evaluation and Treatment Rehabilitation  SUBJECTIVE:   SUBJECTIVE STATEMENT: "I go to the [employer] 3x a week." Pt accompanied by: self and and spouse , [name]  PERTINENT HISTORY: Per chart, Memory Loss: concerns for Lewy body dementia in a patient with episodic confusion, parkinsonism, dream enactment, hyposmia, visual hallucinations, behavioral changes. Memory loss starting 2018 with cognitive decline over 2022. Patient reports difficulty remembering conversations, difficulty recognizing old friends, getting lost while driving. Per spouse patient can become aggressive behind the wheel. Patient reports visual hallucinations (dark areas out of his periphery) and audio hallucinations. Wife assists with medications and finances. Has become more emotional over the last year and sometimes becomes angry with spouse which is unusual. Denies alcohol use or difficulty sleeping. Significant procrastiantion, low motivation.    PRECAUTIONS: Fall  WEIGHT BEARING RESTRICTIONS No  PAIN:  Are you having pain? Yes: NPRS scale: 5/10 Pain location: bilat feet, L worse than R Pain description: "feels like foot is being brushed." Aggravating factors: eating more sugar, prolonged walking or standing Relieving factors: otc pain meds, rest, elevation   FALLS: Has patient fallen in last 6 months? No, but several stumbles; Pt reports he tends to lose his balance forward and struggles to walk down a ramp.  Pt states he's been  caught by another person to prevent a fall while walking down a ramp.  LIVING ENVIRONMENT: Lives with: lives with their spouse Lives in: Other house, 2 levels (upstairs is bonus room but pt rarely goes up there) Stairs: Yes: External: 4 steps; bilateral but cannot reach both Has following equipment at home: Single point cane, uses in community settings in a crowd  PLOF:  retired from [employer], [employer]  PATIENT GOALS "To get to where I can stand up without falling down.  Get better  balance."   OBJECTIVE:  ABC Scale: 60.6% self confident in doing activity without losing balance Berg: 43/56 (low fall risk), but significant challenge with turning to look behind, 360 turn, standing with 1 foot in front, and single leg stance. Tug: 10 sec without AD (normal) 360 Turn test: 5 sec to the R (increased fall risk); Pt also attempted to turn to the L requiring close SBA d/t very wide BOS and small balance check   HAND DOMINANCE: Right  ADLs:  Transfers/ambulation related to ADLs: modified indep-indep with transfers.  Pt walks without AD in the home. Eating: indep, able to cut food Grooming: indep UB Dressing: trouble with buttons, extra time  LB Dressing: trouble with tighter socks  Toileting: indep Bathing: modified indep Tub Shower transfers: modified indep Equipment:  walk in shower with built in seat, 2 grab bars, hand held shower hose   IADLs: Shopping: modified indep with a short list  Light housekeeping: shared with spouse Meal Prep: spouse manages all meals (spouse doesn't want him in the kitchen, but pt can make a light snack and use microwave) Community mobility: Pt uses a cane in a crowd Medication management: spouse uses pill Environmental education officer and manages all meds at baseline Financial management: spouse manages all finances at baseline Handwriting: 100% legible  MOBILITY STATUS: difficulty carrying objects with ambulation; when more fatigued, pt shuffles and has anterior lean  POSTURE COMMENTS:  Anterior lean with ambulation , erect sitting and standing Sitting balance: Moves/returns truncal midpoint >2 inches in all planes  ACTIVITY TOLERANCE: Activity tolerance: increased shuffling when fatigued  FUNCTIONAL OUTCOME MEASURES: FOTO: To be assessed next session  UPPER EXTREMITY ROM    BUE ROM WNL   UPPER EXTREMITY MMT:    BUEs grossly 5/5  HAND FUNCTION: Grip strength: Right: 50 lbs; Left: 59 lbs, Lateral pinch: Right: 17 lbs, Left: 17 lbs, and 3  point pinch: Right: 13 lbs, Left: 12 lbs  COORDINATION: Finger Nose Finger test: [facility] bilat 9 Hole Peg test: Right: 26 sec; Left: 35 sec Mild tremors; worse at night when pt is more tired, worse on the R  SENSATION: Light touch: Pt reports occasional numbness in hands  often when driving; hx of peripheral neuropathy affecting bilat feet  EDEMA: none  MUSCLE TONE: RUE: Within functional limits and LUE: Within functional limits  COGNITION: Overall cognitive status: Impaired, STM deficits, visual and auditory hallucinations at times, sleep disturbances, per chart; spouse is consistent to help keep pt on task during eval  VISION: Subjective report: Hx of cataract removal on the R, planning to have removal on the L Baseline vision: Wears glasses for reading only Occasional visual hallucinations, per chart   PRAXIS: Impaired: Motor planning, L hand more delayed/clumsy with [facility] tasks  OBSERVATIONS:  Increased shuffling of gait with audible "scuffing" of shoes with each step, and anterior lean when ambulating back to therapy clinic upon arrival (no cues).  Improved functional mobility when pt knew he was being recorded, noting  no shuffling and more erect posture.  TODAY'S TREATMENT:  Evaluation completed, goals established. Pt appropriate, willing, and motivated for LSVT BIG program participation with full support from spouse.  Pt and spouse have 2 trips planned in the coming month and will likely plan to begin program mid October to ensure consistency with program requirements of 4 days a week, over 4 consecutive weeks, for 1 hour sessions.  PATIENT EDUCATION: Education details: Role of OT/goals of LSVT program and intense nature and need for consistency with attendance to maximize benefits of program Person educated: Patient and Spouse Education method: Explanation and Verbal cues Education comprehension: verbalized understanding   HOME EXERCISE PROGRAM: To be initiated next  session    GOALS: Goals reviewed with patient? Yes  SHORT TERM GOALS: Target date: 03/30/22  Pt will perform maximal daily exercise with min vc and handout. Baseline:  Will initiate next visit Goal status: INITIAL  2.  Pt will turn in place (360*) <4 sec with good stability. Baseline: 5 sec, requires close supv d/t balance checks; performs with wide BOS. Goal status: INITIAL  3.  Pt will don a tight sock (compression sock) with modified indep Baseline: Pt reports difficulty donning "tight" socks Goal status: INITIAL  LONG TERM GOALS: Target date: 05/11/22  Pt will increase FOTO score by 5 or more points to indicate increase in perceived functional performance with daily tasks.  Baseline: Will complete FOTO next session Goal status: INITIAL  2.  Pt will safely carry a loaded box or basket with good stability and reduced FOF. Baseline: Requires close supv-min A  Goal status: INITIAL  3.  Pt will increase level of confidence to 70% (confidence that pt will not lose balance) when walking in a crowd where people rapidly walk by.   Baseline: 50% confident Goal status: INITIAL  4.  Pt will confidently walk up/down a ramp with use of rail with modified indep. Baseline: Requires min A to amb down a ramp, close supv to amb up ramp Goal status: INITIAL  5.  Pt will increase dexterity in bilat hands to manage clothing fasteners more efficiently. Baseline: difficulty with fastening small buttons  Goal status: INITIAL  6.  Pt will increase level of confidence to 70% (confidence that pt will not lose balance) when transferring in/out of a car. Baseline: 50% confident Goal status: INITIAL  ASSESSMENT:  CLINICAL IMPRESSION: Patient is an 81 y.o. male who was seen today for occupational therapy evaluation d/t functional decline related to Parkinson's Disease vs. Parkinsonism and evaluation for LSVT BIG program.  Pt presents with shuffling gait, frequent LOB, FOF, good BUE strength but  noted [place] deficits and more pronounced on the L, all of which have impacted safety and efficiency with ADL/IADL tasks.  Pt has had STM deficits since 2018, and further cognitive decline in 2022.  Spouse assists with cognitive components of ADLs/IADLs.  Pt works out at [employer] at [employer] a week, and is very motivated to participate in the LSVT program.  Pt and spouse understand the intensive nature and scheduling with this program, and are motivated to proceed.  Planning to begin program mid Oct as pt/spouse have a trip planned in Sept that would interrupt the 4 week frequency of LSVT program, so pt was advised to schedule when he could attend 4 days a week for 4 consecutive weeks.  Pt/spouse in agreement with plan.    PERFORMANCE DEFICITS in functional skills including ADLs, IADLs, coordination, dexterity, proprioception, sensation, pain, [place],  [facility], mobility, balance, body mechanics, endurance, decreased knowledge of precautions, decreased knowledge of use of DME, and vision, cognitive skills including attention, memory, safety awareness, and thought. IMPAIRMENTS are limiting patient from ADLs, IADLs, rest and sleep, leisure, and social participation.   COMORBIDITIES may have co-morbidities  that affects occupational performance. Patient will benefit from skilled OT to address above impairments and improve overall function.  MODIFICATION OR ASSISTANCE TO COMPLETE EVALUATION: Min-Moderate modification of tasks or assist with assess necessary to complete an evaluation.  OT OCCUPATIONAL PROFILE AND HISTORY: Problem focused assessment: Including review of records relating to presenting problem.  CLINICAL DECISION MAKING: Moderate - several treatment options, min-mod task modification necessary  REHAB POTENTIAL: Good  EVALUATION COMPLEXITY: Moderate    PLAN: OT FREQUENCY: 2x/week  OT DURATION: 12 weeks  PLANNED INTERVENTIONS: self care/ADL training, therapeutic exercise, therapeutic  activity, neuromuscular re-education, gait training, balance training, functional mobility training, patient/family education, cognitive remediation/compensation, energy conservation, and DME and/or AE instructions  RECOMMENDED OTHER SERVICES: N/A  CONSULTED AND AGREED WITH PLAN OF CARE: Patient and family member/caregiver  PLAN FOR NEXT SESSION: Initiate maximal daily exercises, initiate functional component tasks, and hierarchy tasks   Leta Speller, MS, OTR/L  Darleene Cleaver, OT 02/16/2022, 7:46 PM

## 2022-04-09 ENCOUNTER — Emergency Department
Admission: EM | Admit: 2022-04-09 | Discharge: 2022-04-09 | Disposition: A | Discharge status: Home / Self Care | Payer: Medicare Other | Attending: Emergency Medicine | Admitting: Emergency Medicine

## 2022-04-09 ENCOUNTER — Other Ambulatory Visit: Payer: Self-pay

## 2022-04-09 DIAGNOSIS — M545 Low back pain, unspecified: Secondary | ICD-10-CM | POA: Insufficient documentation

## 2022-04-09 DIAGNOSIS — I129 Hypertensive chronic kidney disease with stage 1 through stage 4 chronic kidney disease, or unspecified chronic kidney disease: Secondary | ICD-10-CM | POA: Insufficient documentation

## 2022-04-09 DIAGNOSIS — E119 Type 2 diabetes mellitus without complications: Secondary | ICD-10-CM | POA: Insufficient documentation

## 2022-04-09 DIAGNOSIS — N183 Chronic kidney disease, stage 3 unspecified: Secondary | ICD-10-CM | POA: Insufficient documentation

## 2022-04-09 DIAGNOSIS — I2510 Atherosclerotic heart disease of native coronary artery without angina pectoris: Secondary | ICD-10-CM | POA: Diagnosis not present

## 2022-04-09 DIAGNOSIS — Z85828 Personal history of other malignant neoplasm of skin: Secondary | ICD-10-CM | POA: Insufficient documentation

## 2022-04-09 MED ORDER — LIDOCAINE 5 % EX PTCH
1.0000 | MEDICATED_PATCH | CUTANEOUS | Status: DC
  Administered 2022-04-09: 1 via TRANSDERMAL
  Filled 2022-04-09: qty 1

## 2022-04-09 MED ORDER — OXYCODONE HCL 5 MG PO TABS: 5.0000 mg | ORAL_TABLET | Freq: Three times a day (TID) | ORAL | 0 refills | Status: AC | PRN

## 2022-04-09 MED ORDER — OXYCODONE-ACETAMINOPHEN 5-325 MG PO TABS
1.0000 | ORAL_TABLET | Freq: Once | ORAL | Status: AC
  Administered 2022-04-09: 1 via ORAL
  Filled 2022-04-09: qty 1

## 2022-04-09 MED ORDER — LIDOCAINE 5 % EX PTCH: 1.0000 | MEDICATED_PATCH | CUTANEOUS | 0 refills | Status: AC

## 2022-04-09 NOTE — ED Provider Notes (Signed)
[facility] Provider Note    Event Date/Time   First MD Initiated Contact with Patient 04/09/22 1316     (approximate)   History   Back Pain   HPI  Strider Vallance is a 81 y.o. male with past medical history of hyperlipidemia, coronary disease, CKD BPH who presents with back pain.  Symptoms started about 1 week ago.  Pain is in the low back wraps around bilaterally.  Denies any preceding trauma.  They were on a bus trip last week and it was painful with sitting for long periods of time.  Is clearly worse with movement.  Denies any radiation of symptoms denies any numbness or tingling in his lower extremities no bowel bladder incontinence no fevers chills.  Denies any history of cancer or recent weight loss.  He has been taking Tylenol and cyclobenzaprine for it with minimal relief.  He starting physical therapy on Monday for balance issues related to his peripheral neuropathy and wanted to be evaluated prior to starting physical therapy to make sure he can do physical therapy.     Past Medical History:  Diagnosis Date   Arthritis    BPH (benign prostatic hyperplasia)    CKD (chronic kidney disease), stage III (HCC)    Coronary artery disease    GERD (gastroesophageal reflux disease)    History of hiatal hernia    HLD (hyperlipidemia)    Hypertension    Long term current use of antithrombotics/antiplatelets    a.) DAPT therapy (ASA+ ticagrelor)   MCI (mild cognitive impairment) with memory loss    a.) takes apoaequorin   Migraines    OSA on CPAP    Pneumonia    Skin cancer, basal cell    STEMI (ST elevation myocardial infarction) ([place]) 07/27/2020   a.) MI while living in [place]; underwent PCI placing 4.0 x 18 mm and 3.5 x 38 mm Resolute Onyx DES (vessels unknown/unspecified).   T2DM (type 2 diabetes mellitus) ([place])     There are no problems to display for this patient.    Physical Exam  Triage Vital Signs: ED Triage Vitals  Enc Vitals Group      BP 04/09/22 1210 (!) 141/80     Pulse Rate 04/09/22 1210 85     Resp 04/09/22 1210 16     Temp 04/09/22 1210 98 F (36.7 C)     Temp Source 04/09/22 1210 Oral     SpO2 04/09/22 1210 97 %     Weight 04/09/22 1219 190 lb (86.2 kg)     Height 04/09/22 1219 '5\' 10"'$  (1.778 m)     Head Circumference --      Peak Flow --      Pain Score 04/09/22 1218 0     Pain Loc --      Pain Edu? --      Excl. in [place]? --     Most recent vital signs: Vitals:   04/09/22 1210  BP: (!) 141/80  Pulse: 85  Resp: 16  Temp: 98 F (36.7 C)  SpO2: 97%     General: Awake, no distress.  CV:  Good peripheral perfusion.  Resp:  Normal effort.  Abd:  No distention.  Neuro:             Awake, Alert, Oriented x 3  Other:  No midline or lumbar paraspinal tenderness in the lumbar region, no overlying skin changes 5 out of 5 strength with hip flexion, plantarflexion and dorsiflexion, sensation  grossly intact in bilateral lower extremities, patient is able to ambulate   ED Results / Procedures / Treatments  Labs (all labs ordered are listed, but only abnormal results are displayed) Labs Reviewed - No data to display   EKG     RADIOLOGY    PROCEDURES:  Critical Care performed: No  Procedures   MEDICATIONS ORDERED IN ED: Medications  lidocaine (LIDODERM) 5 % 1 patch (has no administration in time range)  oxyCODONE-acetaminophen (PERCOCET/ROXICET) 5-325 MG per tablet 1 tablet (has no administration in time range)     IMPRESSION / MDM / ASSESSMENT AND PLAN / ED COURSE  I reviewed the triage vital signs and the nursing notes.                              Patient's presentation is most consistent with acute, uncomplicated illness.  Differential diagnosis includes, but is not limited to, musculoskeletal back pain including lumbar strain, DJD, spinal stenosis, less likely spinal epidural abscess, tumor, low suspicion for cauda equina syndrome  The patient is a 81 year old male presenting  with 1 week of atraumatic low back pain.  The pain is bilateral and wraps around there is no preceding trauma.  He has no radicular symptoms no numbness tingling or weakness or bowel bladder incontinence to suggest cauda equina syndrome.  He is otherwise been well without fevers or night sweats or weight loss.  On exam there is no midline tenderness he has full strength and sensation in his lower extremities and is able to ambulate.  Suspect musculoskeletal back pain.  Feel that x-ray or CT will be of low yield today.  He does not require urgent MRI given no symptoms of cord compression and low suspicion for infectious process.  I encouraged patient to continue with physical therapy starting on Monday and explained that this is actually usually quite helpful for back pain but just to let the physical therapist know that he is also having pain in the back.  Recommended ongoing Tylenol use and I have also prescribed Lidoderm patch and oxycodone just for breakthrough pain and we discussed the risks with opiates.  Recommended PCP follow-up.       FINAL CLINICAL IMPRESSION(S) / ED DIAGNOSES   Final diagnoses:  Acute bilateral low back pain without sciatica     Rx / DC Orders   ED Discharge Orders          Ordered    lidocaine (LIDODERM) 5 %  Every 24 hours        04/09/22 1413    oxyCODONE (ROXICODONE) 5 MG immediate release tablet  Every 8 hours PRN        04/09/22 1413             Note:  This document was prepared using Dragon voice recognition software and may include unintentional dictation errors.   Rada Hay, MD 04/09/22 [phone]

## 2022-04-09 NOTE — ED Triage Notes (Signed)
Pt to ED for bilateral lower back pain since about 1 week. Pain intermittent, when pain comes it is too painful to walk. Pt uses cane. At this moment pain is 0/10, pain aggravated by movement and getting up/down. Denies radiation into legs, denies urinary symptoms, injuries or recent falls.  Wife states pt is supposed to start PT in 2 days, they are unsure if he should do it.

## 2022-04-09 NOTE — Discharge Instructions (Addendum)
It is okay for you to start physical therapy as long as you are some no of the back pain.  Please follow-up with your primary care for further management.  Please return to the emergency department if you develop numbness or weakness in your legs or are unable to urinate or have a bowel movement.  Can continue to take Tylenol for the pain as well as the Lidoderm patch.  For severe pain you can take the oxycodone but just do not drive after taking this and please be aware that it can make you constipated and sleepy.  Do not take the cyclobenzaprine while you are taking the oxycodone.

## 2022-04-09 NOTE — ED Provider Triage Note (Signed)
Emergency Medicine Provider Triage Evaluation Note  Imran Nuon , a 81 y.o. male h/o HTN, diabetes was evaluated in triage.  Pt complains of low back pain x1 week. Reports worse with certain movement. No radiation. Has been taking tylenol and flexeril. No problems with urination or moving bowels. Able to walk. Has been using heating pad with relief. Currently no pain   Review of Systems  Positive: Back pain Negative: Incontinence/retention, abdominal pain, dysuria  Physical Exam  BP (!) 141/80 (BP Location: Left Arm)   Pulse 85   Temp 98 F (36.7 C) (Oral)   Resp 16   SpO2 97%  Gen:   Awake, no distress   Resp:  Normal effort  MSK:   Moves extremities without difficulty  Other:  No midline tenderness, normal strength and sensation in bilateral lower extremities  Medical Decision Making  Medically screening exam initiated at 12:11 PM.  Appropriate orders placed.  Makaveli Hoard was informed that the remainder of the evaluation will be completed by another provider, this initial triage assessment does not replace that evaluation, and the importance of remaining in the ED until their evaluation is complete.     Marquette Old, PA-C 04/09/22 1226

## 2022-04-09 NOTE — ED Notes (Signed)
Ambulatory with cane. No signs of distress.

## 2022-04-11 ENCOUNTER — Ambulatory Visit: Payer: Medicare Other | Attending: Neurology

## 2022-04-11 DIAGNOSIS — R278 Other lack of coordination: Secondary | ICD-10-CM | POA: Diagnosis present

## 2022-04-11 DIAGNOSIS — G20B1 Parkinson's disease with dyskinesia, without mention of fluctuations: Secondary | ICD-10-CM | POA: Insufficient documentation

## 2022-04-11 DIAGNOSIS — R2681 Unsteadiness on feet: Secondary | ICD-10-CM | POA: Insufficient documentation

## 2022-04-11 DIAGNOSIS — G20B2 Parkinson's disease with dyskinesia, with fluctuations: Secondary | ICD-10-CM | POA: Diagnosis present

## 2022-04-11 DIAGNOSIS — M6281 Muscle weakness (generalized): Secondary | ICD-10-CM | POA: Diagnosis present

## 2022-04-11 NOTE — Therapy (Signed)
OUTPATIENT OCCUPATIONAL THERAPY NEURO/LSVT BIG TREATMENT NOTE  Patient Name: Carlos Mathis MRN: [id number] DOB:May 06, 1941, 81 y.o., male Today's Date: 04/11/2022  PCP: Dr. Frazier Richards REFERRING PROVIDER: Dr. Jennings Books   OT End of Session - 04/11/22 1134     Visit Number 2    Number of Visits 17    Date for OT Re-Evaluation 05/11/22    OT Start Time 1000    OT Stop Time 1100    OT Time Calculation (min) 60 min    Activity Tolerance Patient tolerated treatment well    Behavior During Therapy WFL for tasks assessed/performed             Past Medical History:  Diagnosis Date   Arthritis    BPH (benign prostatic hyperplasia)    CKD (chronic kidney disease), stage III ([place])    Coronary artery disease    GERD (gastroesophageal reflux disease)    History of hiatal hernia    HLD (hyperlipidemia)    Hypertension    Long term current use of antithrombotics/antiplatelets    a.) DAPT therapy (ASA+ ticagrelor)   MCI (mild cognitive impairment) with memory loss    a.) takes apoaequorin   Migraines    OSA on CPAP    Pneumonia    Skin cancer, basal cell    STEMI (ST elevation myocardial infarction) ([place]) 07/27/2020   a.) MI while living in [place]; underwent PCI placing 4.0 x 18 mm and 3.5 x 38 mm Resolute Onyx DES (vessels unknown/unspecified).   T2DM (type 2 diabetes mellitus) ([place])    Past Surgical History:  Procedure Laterality Date   CARDIAC CATHETERIZATION     CATARACT EXTRACTION Right    HERNIA REPAIR Left    inguinal   INSERTION OF MESH  09/01/2021   Procedure: INSERTION OF MESH;  Surgeon: Herbert Pun, MD;  Location: [facility] ORS;  Service: General;;   JOINT REPLACEMENT     tka   UMBILICAL HERNIA REPAIR     There are no problems to display for this patient.   ONSET DATE: 01/31/22 (date of referral)  REFERRING DIAG: Parkinson's Disease  THERAPY DIAG:  Parkinson's disease with dyskinesia, unspecified whether manifestations fluctuate  Other lack  of coordination  Unsteadiness on feet  Rationale for Evaluation and Treatment Rehabilitation  SUBJECTIVE:   SUBJECTIVE STATEMENT: Pt reports he's still having a lot of back pain today.  Pt was in the ED this week on 04/09/22 for severe back pain.  Pt reports that it feels like spasms in his lower back, but that he is wanting to try and do what he can do today for his therapy session. Pt accompanied by: self and and spouse , [name]  PERTINENT HISTORY: Per chart, Memory Loss: concerns for Lewy body dementia in a patient with episodic confusion, parkinsonism, dream enactment, hyposmia, visual hallucinations, behavioral changes. Memory loss starting 2018 with cognitive decline over 2022. Patient reports difficulty remembering conversations, difficulty recognizing old friends, getting lost while driving. Per spouse patient can become aggressive behind the wheel. Patient reports visual hallucinations (dark areas out of his periphery) and audio hallucinations. Wife assists with medications and finances. Has become more emotional over the last year and sometimes becomes angry with spouse which is unusual. Denies alcohol use or difficulty sleeping. Significant procrastiantion, low motivation.    PRECAUTIONS: Fall  WEIGHT BEARING RESTRICTIONS No  PAIN:  Are you having pain? Yes: NPRS scale: 3-4/10 pain at rest; when it "catches" 10/10 Pain location: low back Pain description: spasm, grabbing  Aggravating factors: standing/walking Relieving factors: heat, rest, pain meds  FALLS: Has patient fallen in last 6 months? No, but several stumbles; Pt reports he tends to lose his balance forward and struggles to walk down a ramp.  Pt states he's been caught by another person to prevent a fall while walking down a ramp.  LIVING ENVIRONMENT: Lives with: lives with their spouse Lives in: Other house, 2 levels (upstairs is bonus room but pt rarely goes up there) Stairs: Yes: External: 4 steps; bilateral but  cannot reach both Has following equipment at home: Single point cane, uses in community settings in a crowd  PLOF:  retired from [employer], [employer]  PATIENT GOALS "To get to where I can stand up without falling down.  Get better balance."   OBJECTIVE:  ABC Scale: 60.6% self confident in doing activity without losing balance Berg: 43/56 (low fall risk), but significant challenge with turning to look behind, 360 turn, standing with 1 foot in front, and single leg stance. Tug: 10 sec without AD (normal) 360 Turn test: 5 sec to the R (increased fall risk); Pt also attempted to turn to the L requiring close SBA d/t very wide BOS and small balance check   04/11/22: 6 MWT: 720 ft (min guard with gait belt, followed by transport chair) BP and HR WNL before/after.    HAND DOMINANCE: Right   MOBILITY STATUS: difficulty carrying objects with ambulation; when more fatigued, pt shuffles and has anterior lean  POSTURE COMMENTS:  Anterior lean with ambulation , erect sitting and standing Sitting balance: Moves/returns truncal midpoint >2 inches in all planes  ACTIVITY TOLERANCE: Activity tolerance: increased shuffling when fatigued  FUNCTIONAL OUTCOME MEASURES: FOTO: To be assessed next session  UPPER EXTREMITY ROM    BUE ROM WNL   UPPER EXTREMITY MMT:    BUEs grossly 5/5  HAND FUNCTION: Grip strength: Right: 50 lbs; Left: 59 lbs, Lateral pinch: Right: 17 lbs, Left: 17 lbs, and 3 point pinch: Right: 13 lbs, Left: 12 lbs  COORDINATION: Finger Nose Finger test: [facility] bilat 9 Hole Peg test: Right: 26 sec; Left: 35 sec  COGNITION: Overall cognitive status: Impaired, STM deficits, visual and auditory hallucinations at times, sleep disturbances, per chart; spouse is consistent to help keep pt on task during eval  VISION: Subjective report: Hx of cataract removal on the R, planning to have removal on the L Baseline vision: Wears glasses for reading only Occasional visual  hallucinations, per chart   PRAXIS: Impaired: Motor planning, L hand more delayed/clumsy with [facility] tasks  OBSERVATIONS:  Increased shuffling of gait with audible "scuffing" of shoes with each step, and anterior lean when ambulating back to therapy clinic upon arrival (no cues).  Improved functional mobility when pt knew he was being recorded, noting no shuffling and more erect posture.  TODAY'S TREATMENT:  Therapeutic Exercise: Completed 6 MWT: 720 ft.  Pt required min guard with gait belt, followed by transport chair from a second person d/t pt reporting limited mobility today d/t back pain.  VS taken before and after; BP and HR both WNL (pre: 127/80, HR 92; post 121/78, HR 84).  Post VS taken several min after d/t machine malfunction, so OT took VS manually.  Noted excessive shuffling during test.  Pt responded well to cues for lifting feet to prevent shuffling, but required repeated cues as he did not maintain "big walking." Performed postural stretch and hold with feet and back against wall for a 1 min hold x2.  Pt required visual and tactile cues for tucking chin to push head posteriorly against wall (difficulty maintaining but able to just barely touch wall with posterior head for a few seconds).   Self Care: Practiced buttoning small buttons on a shirt laid out on table top.  Unbuttoned/buttoned 6 buttons x 3 sets.   Neuro re-ed:  LSVT: Patient seen for LSVT Daily Session Maximal Daily Exercises (in the adapted/seated position today d/t severity of back pain) for facilitation/coordination of movement. Maximum Sustained Movements are designed to rescale the amplitude of movement output for generalization to daily functional activities. Performed as follows for 1 set of 10 repetitions each multi-directional sustained movements in the seated position d/t severity of back pain today. 1) Floor to ceiling  2) Side to side multidirectional   Repetitive movements performed in standing (performed  in the adapted/seated position today d/t severity of back pain) and are designed to provide retraining effort needed for sustained muscle activation in tasks. Performed as follows for 1 set of 10 repetitions each of multi-directional repetitive movements: 3) Step and reach forward step- 4) Step and reach sideways step-   5) Step and reach backwards step- 6) Rock and reach forward/backward-  7) Rock and reach sideways-   Functional Component Task: 1.Sit to stand BIG functional component task with supervision 5 reps 2. Buttoning/unbuttoning small buttons on a shirt: managed 6 buttons x3 sets.  Instructed in 10 finger flicks before set 4 and 5 and encouraged flicks in prep for FM tasks at home.  Good return demo.  Below not yet completed 3. Carrying a full laundry basket 4. Walking up/down a ramp 5. Transfer in/out of car  Patient not yet given daily carryover task to complete d/t severity of back pain.  Patient to practice doing (TBD)  BIG ambulation: 6 MWT, see above under therapeutic exercise  PATIENT EDUCATION: Education details: Maximal daily exercises in the adapted/seated position Person educated: pt Education method: explanation, demo, written handout Education comprehension: verbalized and demonstrated understanding with verbal and tactile cues   HOME EXERCISE PROGRAM: Will issue handout next session; Encouraged pt to rest his back today and use heat as needed for pain management.  GOALS: Goals reviewed with patient? Yes  SHORT TERM GOALS: Target date: 03/30/22  Pt will perform maximal daily exercise with min vc and handout. Baseline:  Will initiate next visit Goal status: INITIAL  2.  Pt will turn in place (360*) <4 sec with good stability. Baseline: 5 sec, requires close supv d/t balance checks; performs with wide BOS. Goal status: INITIAL  3.  Pt will don a tight sock (compression sock) with modified indep Baseline: Pt reports difficulty donning "tight"  socks Goal status: INITIAL  LONG TERM GOALS: Target date: 05/11/22  Pt will increase FOTO score by 5 or more points to indicate increase in perceived functional performance with daily tasks.  Baseline: Will complete FOTO next session Goal status: INITIAL  2.  Pt will safely carry a loaded box or basket with good stability and reduced FOF. Baseline: Requires close supv-min A  Goal status: INITIAL  3.  Pt will increase level of confidence to 70% (confidence that pt will not lose balance) when walking in a crowd where people rapidly walk by.   Baseline: 50% confident Goal status: INITIAL  4.  Pt will confidently walk up/down a ramp with use of rail with modified indep. Baseline: Requires min A to amb down a ramp, close supv to amb up ramp Goal status: INITIAL  5.  Pt will increase dexterity in bilat hands to manage clothing fasteners more efficiently. Baseline: difficulty with fastening small buttons  Goal status: INITIAL  6.  Pt will increase level of confidence to 70% (confidence that pt will not lose balance) when transferring in/out of a car. Baseline: 50% confident Goal status: INITIAL  ASSESSMENT:  CLINICAL IMPRESSION: Pt arrived today with cane and transport chair d/t experiencing severe back pain since his recent trip to [place].  Pt was at the ED for this pain on 04/09/22, and pain determined to be likely muscular in nature.  Pt describes low back pain as "grabbing" and like a spasm.  OT provided frequent rest breaks with activity, and reinforced pt to perform all activities within his tolerable pain range.  OT instructed pt today in maximal daily exercises in the adapted/seated position d/t increased back pain in standing.  Pt tolerated well with frequent rest breaks, and performed all exercises with initial demo, and then mod vc and tactile cues for modeling big hand and foot positions.  Educated pt that typically these exercises would be performed 2x daily, but advised pt  that 1x only today is encouraged d/t back pain.  Pt was able to complete the 6 MWT, requiring min guard and transport chair followed behind from a second person.  On pt's last lap, pt took 1 brief standing rest break and had 1 small knee buckle with a quick onset of back pain, but was then able to continue and finish test.  Pt reported a perceived Bigness effort of 3/10 today (somewhat difficult-moderate effort).  Plan to progress maximal daily exercises to standing next session as pt was educated that our goal is to work in the 7-9 range for effort.  Suspect pt was in the 6-7 range today d/t pt requiring frequent rest and water breaks.  Pt will continue to benefit from skilled OT for progression of maximal daily exercises, hierarchy tasks, and functional component tasks for an overall goal of calibrating movements for pt to automatically use bigger/larger amplitude movements in everyday living, as well as to improve strength and balance and decrease risk of falling.  PERFORMANCE DEFICITS in functional skills including ADLs, IADLs, coordination, dexterity, proprioception, sensation, pain, FMC, GMC, mobility, balance, body mechanics, endurance, decreased knowledge of precautions, decreased knowledge of use of DME, and vision, cognitive skills including attention, memory, safety awareness, and thought. IMPAIRMENTS are limiting patient from ADLs, IADLs, rest and sleep, leisure, and social participation.   COMORBIDITIES may have co-morbidities  that affects occupational performance. Patient will benefit from skilled OT to address above impairments and improve overall function.  MODIFICATION OR ASSISTANCE TO COMPLETE EVALUATION: Min-Moderate modification of tasks or assist with assess necessary to complete an evaluation.  OT OCCUPATIONAL PROFILE AND HISTORY: Problem focused assessment: Including review of records relating to presenting problem.  CLINICAL DECISION MAKING: Moderate - several treatment options,  min-mod task modification necessary  REHAB POTENTIAL: Good  EVALUATION COMPLEXITY: Moderate    PLAN: OT FREQUENCY: 2x/week  OT DURATION: 12 weeks  PLANNED INTERVENTIONS: self care/ADL training, therapeutic exercise, therapeutic activity, neuromuscular re-education, gait training, balance training, functional mobility training, patient/family education, cognitive remediation/compensation, energy conservation, and DME and/or AE instructions  RECOMMENDED OTHER SERVICES: N/A  CONSULTED AND AGREED WITH PLAN OF CARE: Patient and family member/caregiver  PLAN FOR NEXT SESSION: Initiate maximal daily exercises, initiate functional component tasks, and hierarchy tasks   Leta Speller, MS, OTR/L  Darleene Cleaver, OT 04/11/2022, 11:38 AM

## 2022-04-13 ENCOUNTER — Ambulatory Visit: Payer: Medicare Other

## 2022-04-13 DIAGNOSIS — G20B1 Parkinson's disease with dyskinesia, without mention of fluctuations: Secondary | ICD-10-CM | POA: Diagnosis not present

## 2022-04-13 DIAGNOSIS — M6281 Muscle weakness (generalized): Secondary | ICD-10-CM

## 2022-04-13 DIAGNOSIS — R278 Other lack of coordination: Secondary | ICD-10-CM

## 2022-04-13 DIAGNOSIS — G20B2 Parkinson's disease with dyskinesia, with fluctuations: Secondary | ICD-10-CM

## 2022-04-14 ENCOUNTER — Ambulatory Visit: Payer: Medicare Other

## 2022-04-14 DIAGNOSIS — M6281 Muscle weakness (generalized): Secondary | ICD-10-CM

## 2022-04-14 DIAGNOSIS — R278 Other lack of coordination: Secondary | ICD-10-CM

## 2022-04-14 DIAGNOSIS — G20B2 Parkinson's disease with dyskinesia, with fluctuations: Secondary | ICD-10-CM

## 2022-04-14 DIAGNOSIS — G20B1 Parkinson's disease with dyskinesia, without mention of fluctuations: Secondary | ICD-10-CM | POA: Diagnosis not present

## 2022-04-15 ENCOUNTER — Ambulatory Visit: Payer: Medicare Other

## 2022-04-15 DIAGNOSIS — G20B1 Parkinson's disease with dyskinesia, without mention of fluctuations: Secondary | ICD-10-CM | POA: Diagnosis not present

## 2022-04-15 DIAGNOSIS — R278 Other lack of coordination: Secondary | ICD-10-CM

## 2022-04-15 DIAGNOSIS — G20B2 Parkinson's disease with dyskinesia, with fluctuations: Secondary | ICD-10-CM

## 2022-04-15 DIAGNOSIS — M6281 Muscle weakness (generalized): Secondary | ICD-10-CM

## 2022-04-17 NOTE — Therapy (Addendum)
OUTPATIENT OCCUPATIONAL THERAPY NEURO/LSVT BIG TREATMENT NOTE  Patient Name: Carlos Mathis MRN: [id number] DOB:12-10-1940, 81 y.o., male Today's Date: 04/17/2022  PCP: Dr. Frazier Richards REFERRING PROVIDER: Dr. Jennings Books   OT End of Session - 04/17/22 1927     Visit Number 5    Number of Visits 17    Date for OT Re-Evaluation 05/11/22    OT Start Time 1000    OT Stop Time 1100    OT Time Calculation (min) 60 min    Equipment Utilized During Treatment [facility]    Activity Tolerance Patient tolerated treatment well    Behavior During Therapy WFL for tasks assessed/performed             Past Medical History:  Diagnosis Date   Arthritis    BPH (benign prostatic hyperplasia)    CKD (chronic kidney disease), stage III ([place])    Coronary artery disease    GERD (gastroesophageal reflux disease)    History of hiatal hernia    HLD (hyperlipidemia)    Hypertension    Long term current use of antithrombotics/antiplatelets    a.) DAPT therapy (ASA+ ticagrelor)   MCI (mild cognitive impairment) with memory loss    a.) takes apoaequorin   Migraines    OSA on CPAP    Pneumonia    Skin cancer, basal cell    STEMI (ST elevation myocardial infarction) ([place]) 07/27/2020   a.) MI while living in [place]; underwent PCI placing 4.0 x 18 mm and 3.5 x 38 mm Resolute Onyx DES (vessels unknown/unspecified).   T2DM (type 2 diabetes mellitus) ([place])    Past Surgical History:  Procedure Laterality Date   CARDIAC CATHETERIZATION     CATARACT EXTRACTION Right    HERNIA REPAIR Left    inguinal   INSERTION OF MESH  09/01/2021   Procedure: INSERTION OF MESH;  Surgeon: Herbert Pun, MD;  Location: [facility] ORS;  Service: General;;   JOINT REPLACEMENT     tka   UMBILICAL HERNIA REPAIR     There are no problems to display for this patient.   ONSET DATE: 01/31/22 (date of referral)  REFERRING DIAG: Parkinson's Disease  THERAPY DIAG:  Muscle weakness (generalized)  Other lack of  coordination  Parkinson's disease with dyskinesia and fluctuating manifestations  Rationale for Evaluation and Treatment Rehabilitation  SUBJECTIVE:   SUBJECTIVE STATEMENT: Pt was pleased to be able to make it down to the therapy clinic today with his cane and no transport chair.  Pt accompanied by: self  PERTINENT HISTORY: Per chart, Memory Loss: concerns for Lewy body dementia in a patient with episodic confusion, parkinsonism, dream enactment, hyposmia, visual hallucinations, behavioral changes. Memory loss starting 2018 with cognitive decline over 2022. Patient reports difficulty remembering conversations, difficulty recognizing old friends, getting lost while driving. Per spouse patient can become aggressive behind the wheel. Patient reports visual hallucinations (dark areas out of his periphery) and audio hallucinations. Wife assists with medications and finances. Has become more emotional over the last year and sometimes becomes angry with spouse which is unusual. Denies alcohol use or difficulty sleeping. Significant procrastiantion, low motivation.    PRECAUTIONS: Fall  WEIGHT BEARING RESTRICTIONS No  PAIN:  Are you having pain? Yes: NPRS scale: 2/10 Pain location: low back Pain description: spasm, grabbing Aggravating factors: standing/walking Relieving factors: heat, rest, pain meds  FALLS: Has patient fallen in last 6 months? No, but several stumbles; Pt reports he tends to lose his balance forward and struggles to walk down a  ramp.  Pt states he's been caught by another person to prevent a fall while walking down a ramp.  LIVING ENVIRONMENT: Lives with: lives with their spouse Lives in: Other house, 2 levels (upstairs is bonus room but pt rarely goes up there) Stairs: Yes: External: 4 steps; bilateral but cannot reach both Has following equipment at home: Single point cane, uses in community settings in a crowd  PLOF:  retired from [employer], [employer]  PATIENT GOALS  "To get to where I can stand up without falling down.  Get better balance."   OBJECTIVE:  ABC Scale: 60.6% self confident in doing activity without losing balance Berg: 43/56 (low fall risk), but significant challenge with turning to look behind, 360 turn, standing with 1 foot in front, and single leg stance. Tug: 10 sec without AD (normal) 360 Turn test: 5 sec to the R (increased fall risk); Pt also attempted to turn to the L requiring close SBA d/t very wide BOS and small balance check   04/11/22: 6 MWT: 720 ft (min guard with gait belt, followed by transport chair) BP and HR WNL before/after.    HAND DOMINANCE: Right   MOBILITY STATUS: difficulty carrying objects with ambulation; when more fatigued, pt shuffles and has anterior lean  POSTURE COMMENTS:  Anterior lean with ambulation , erect sitting and standing Sitting balance: Moves/returns truncal midpoint >2 inches in all planes  ACTIVITY TOLERANCE: Activity tolerance: increased shuffling when fatigued  FUNCTIONAL OUTCOME MEASURES: FOTO: To be assessed next session  UPPER EXTREMITY ROM    BUE ROM WNL   UPPER EXTREMITY MMT:    BUEs grossly 5/5  HAND FUNCTION: Grip strength: Right: 50 lbs; Left: 59 lbs, Lateral pinch: Right: 17 lbs, Left: 17 lbs, and 3 point pinch: Right: 13 lbs, Left: 12 lbs  COORDINATION: Finger Nose Finger test: WFL bilat 9 Hole Peg test: Right: 26 sec; Left: 35 sec   TODAY'S TREATMENT: Neuro re-ed:  LSVT: Patient seen for LSVT Daily Session Maximal Daily Exercises (in the adapted/standing position today with chair for UE support) for facilitation/coordination of movement. Maximum Sustained Movements are designed to rescale the amplitude of movement output for generalization to daily functional activities. Performed as follows for 1 set of 10 repetitions each multi-directional sustained movements. 1) Floor to ceiling (improving reach to floor) 2) Side to side multidirectional (tactile cues for  outstretched back leg 50% of the time)  Repetitive movements performed in standing (performed in the adapted/standing position today with chair for UE support) and are designed to provide retraining effort needed for sustained muscle activation in tasks. Performed as follows for 1 set of 10 repetitions each of multi-directional repetitive movements: 3) Step and reach forward step- 4) Step and reach sideways step-   5) Step and reach backwards step- 6) Rock and reach forward/backward-  7) Rock and reach sideways-   Wall stretch completed for erect posture: 3 reps of 1 min hold, tactile and vc for slight chin tuck and posterior shift of head to wall, shoulders back.   Completed seated marches x3 sets 10 reps each, using hands outstretched for a target for knees to increase lift height.  Encouraged pt complete these at home daily to work towards increased foot clearance for Big walking and car transfer.  Pt verbalized understanding. Big walking outside on sidewalk with close supv, several feet on grassy area (min guard) to reach a bench for seated rest break: cues for neutral head, foot clearance with simulated marching, and erect standing  posture.  Pt currently performs 1 technique at a time, not all 3.    Functional Component Task: 1.Sit to stand with supervision 10 reps, min vc for big reach upon sitting and standing.  Able to perform without support of chair upon standing. 2. Buttoning/unbuttoning small buttons on a shirt: managed 6 buttons x3 sets.  Unable to recall compensatory warm up method for fingers to prep for a FM task being finger flicks; vc to perform 10 finger flicks before buttoning today; good return demo.  Hierarchy Tasks 3. Carrying a full laundry basket (not yet completed) 4. Walking up/down a ramp; close supv with cane up ramp, min guard with cane down the ramp; max vc for increasing foot clearance with each step (cues to think about marching), neutral head, slow pace, and  erect posture when amb down ramp.  5 reps up/down ramp. 5. Transfer into car 1x, min vc for exaggerated lift of each leg when transferring in/out of the car; good foot clearance for BLEs.   PATIENT EDUCATION: Education details: Maximal daily exercises in the adapted/standing position, ramp negotiation, car transfer technique. Person educated: pt Education method: explanation, demo, written handout Education comprehension: verbalized and demonstrated understanding with verbal and tactile cues   HOME EXERCISE PROGRAM: Pt given daily carryover task of maximal daily exercises in the adapted position (in standing with chair for UE support), Big walking, and seated marches.  GOALS: Goals reviewed with patient? Yes  SHORT TERM GOALS: Target date: 03/30/22  Pt will perform maximal daily exercise with min vc and handout. Baseline:  Will initiate next visit Goal status: INITIAL  2.  Pt will turn in place (360*) <4 sec with good stability. Baseline: 5 sec, requires close supv d/t balance checks; performs with wide BOS. Goal status: INITIAL  3.  Pt will don a tight sock (compression sock) with modified indep Baseline: Pt reports difficulty donning "tight" socks Goal status: INITIAL  LONG TERM GOALS: Target date: 05/11/22  Pt will increase FOTO score by 5 or more points to indicate increase in perceived functional performance with daily tasks.  Baseline: Will complete FOTO next session Goal status: INITIAL  2.  Pt will safely carry a loaded box or basket with good stability and reduced FOF. Baseline: Requires close supv-min A  Goal status: INITIAL  3.  Pt will increase level of confidence to 70% (confidence that pt will not lose balance) when walking in a crowd where people rapidly walk by.   Baseline: 50% confident Goal status: INITIAL  4.  Pt will confidently walk up/down a ramp with use of rail with modified indep. Baseline: Requires min A to amb down a ramp, close supv to amb up  ramp Goal status: INITIAL  5.  Pt will increase dexterity in bilat hands to manage clothing fasteners more efficiently. Baseline: difficulty with fastening small buttons  Goal status: INITIAL  6.  Pt will increase level of confidence to 70% (confidence that pt will not lose balance) when transferring in/out of a car. Baseline: 50% confident Goal status: INITIAL  ASSESSMENT:  CLINICAL IMPRESSION: Pt was pleased to be able to make it down to the therapy clinic today with his cane and no transport chair.  Back is still catching intermittently during transfers or walking, so gait belt is used to reduce fall risk, but reach to floor is improving with floor to ceiling exercise.  With sideways rock and reach, pt has little rotation at hips and shoulders, noting inability to look behind him  in either direction.  Pt required fewer seated rest breaks with maximal daily exercises this date.  Pt continues to require max vc and tactile cues for form, and coordinating arm/leg movements correctly with each exercise.  Pt requiring fewer cues for big hand movements, and fewer vc (mod vc today) for picking up feet instead of sliding them during step and reach exercises.  Pt did not require transport chair today to move from basement to first floor for ramp practice.  With walking up/down ramp, pt requires min guard with cane, and max vc and tactile cues for neutral head (pt looks down at floor without cues), shoulders back, and foot clearance to avoid shuffling.  Increased shuffling continues to be present with fatigue.  With big walking, pt continues to require cues for foot clearance, neutral head position, and erect posture; pt currently performs 1 technique at a time, not all 3.  Pt reported that he tried his maximal daily exercises last night, but stated that he wasn't sure how well he performed them.  OT provided positive reinforcement for the fact that he attempted them on his own, and encouraged pt that they  will get easier with repetition.  Pt reported that he feels like he looks funny when he picks up his feet to walk, but OT reinforced that these bigger movements will feel unnatural at first, become more natural with repetition, and that these movements are normal movement patterns despite the awkwardness that pt feels.  OT provided education on program goal of recalibrating his movements.  Pt will continue to benefit from skilled OT for progression of maximal daily exercises, hierarchy tasks, and functional component tasks for an overall goal of calibrating movements for pt to automatically use bigger/larger amplitude movements in everyday living, as well as to improve strength and balance and decrease risk of falling.  PERFORMANCE DEFICITS in functional skills including ADLs, IADLs, coordination, dexterity, proprioception, sensation, pain, FMC, GMC, mobility, balance, body mechanics, endurance, decreased knowledge of precautions, decreased knowledge of use of DME, and vision, cognitive skills including attention, memory, safety awareness, and thought. IMPAIRMENTS are limiting patient from ADLs, IADLs, rest and sleep, leisure, and social participation.   COMORBIDITIES may have co-morbidities  that affects occupational performance. Patient will benefit from skilled OT to address above impairments and improve overall function.  MODIFICATION OR ASSISTANCE TO COMPLETE EVALUATION: Min-Moderate modification of tasks or assist with assess necessary to complete an evaluation.  OT OCCUPATIONAL PROFILE AND HISTORY: Problem focused assessment: Including review of records relating to presenting problem.  CLINICAL DECISION MAKING: Moderate - several treatment options, min-mod task modification necessary  REHAB POTENTIAL: Good  EVALUATION COMPLEXITY: Moderate    PLAN: OT FREQUENCY: 2x/week  OT DURATION: 12 weeks  PLANNED INTERVENTIONS: self care/ADL training, therapeutic exercise, therapeutic activity,  neuromuscular re-education, gait training, balance training, functional mobility training, patient/family education, cognitive remediation/compensation, energy conservation, and DME and/or AE instructions  RECOMMENDED OTHER SERVICES: N/A  CONSULTED AND AGREED WITH PLAN OF CARE: Patient and family member/caregiver  PLAN FOR NEXT SESSION: Initiate maximal daily exercises, initiate functional component tasks, and hierarchy tasks   Leta Speller, MS, OTR/L  Darleene Cleaver, OT 04/17/2022, 7:28 PM

## 2022-04-17 NOTE — Therapy (Addendum)
OUTPATIENT OCCUPATIONAL THERAPY NEURO/LSVT BIG TREATMENT NOTE  Patient Name: Carlos Mathis MRN: [id number] DOB:April 17, 1941, 81 y.o., male Today's Date: 04/17/2022  PCP: Dr. Frazier Richards REFERRING PROVIDER: Dr. Jennings Books   OT End of Session - 04/17/22 [place]     Visit Number 4    Number of Visits 17    Date for OT Re-Evaluation 05/11/22    OT Start Time 0830    OT Stop Time 0930    OT Time Calculation (min) 60 min    Equipment Utilized During Treatment [facility]    Activity Tolerance Patient tolerated treatment well    Behavior During Therapy WFL for tasks assessed/performed             Past Medical History:  Diagnosis Date   Arthritis    BPH (benign prostatic hyperplasia)    CKD (chronic kidney disease), stage III ([place])    Coronary artery disease    GERD (gastroesophageal reflux disease)    History of hiatal hernia    HLD (hyperlipidemia)    Hypertension    Long term current use of antithrombotics/antiplatelets    a.) DAPT therapy (ASA+ ticagrelor)   MCI (mild cognitive impairment) with memory loss    a.) takes apoaequorin   Migraines    OSA on CPAP    Pneumonia    Skin cancer, basal cell    STEMI (ST elevation myocardial infarction) ([place]) 07/27/2020   a.) MI while living in [place]; underwent PCI placing 4.0 x 18 mm and 3.5 x 38 mm Resolute Onyx DES (vessels unknown/unspecified).   T2DM (type 2 diabetes mellitus) ([place])    Past Surgical History:  Procedure Laterality Date   CARDIAC CATHETERIZATION     CATARACT EXTRACTION Right    HERNIA REPAIR Left    inguinal   INSERTION OF MESH  09/01/2021   Procedure: INSERTION OF MESH;  Surgeon: Herbert Pun, MD;  Location: [facility] ORS;  Service: General;;   JOINT REPLACEMENT     tka   UMBILICAL HERNIA REPAIR     There are no problems to display for this patient.   ONSET DATE: 01/31/22 (date of referral)  REFERRING DIAG: Parkinson's Disease  THERAPY DIAG:  Muscle weakness (generalized)  Other lack of  coordination  Parkinson's disease with dyskinesia and fluctuating manifestations  Rationale for Evaluation and Treatment Rehabilitation  SUBJECTIVE:  Pt reports back is still catching but continues to improve.  SUBJECTIVE STATEMENT: Pt reports he thinks the exercises are helping to loosen up his back a little bit.   Pt accompanied by: self  PERTINENT HISTORY: Per chart, Memory Loss: concerns for Lewy body dementia in a patient with episodic confusion, parkinsonism, dream enactment, hyposmia, visual hallucinations, behavioral changes. Memory loss starting 2018 with cognitive decline over 2022. Patient reports difficulty remembering conversations, difficulty recognizing old friends, getting lost while driving. Per spouse patient can become aggressive behind the wheel. Patient reports visual hallucinations (dark areas out of his periphery) and audio hallucinations. Wife assists with medications and finances. Has become more emotional over the last year and sometimes becomes angry with spouse which is unusual. Denies alcohol use or difficulty sleeping. Significant procrastiantion, low motivation.    PRECAUTIONS: Fall  WEIGHT BEARING RESTRICTIONS No  PAIN:  Are you having pain? Yes: NPRS scale: 2/10 Pain location: low back Pain description: spasm, grabbing Aggravating factors: standing/walking Relieving factors: heat, rest, pain meds  FALLS: Has patient fallen in last 6 months? No, but several stumbles; Pt reports he tends to lose his balance forward and  struggles to walk down a ramp.  Pt states he's been caught by another person to prevent a fall while walking down a ramp.  LIVING ENVIRONMENT: Lives with: lives with their spouse Lives in: Other house, 2 levels (upstairs is bonus room but pt rarely goes up there) Stairs: Yes: External: 4 steps; bilateral but cannot reach both Has following equipment at home: Single point cane, uses in community settings in a crowd  PLOF:  retired from  [employer], [employer]  PATIENT GOALS "To get to where I can stand up without falling down.  Get better balance."   OBJECTIVE:  ABC Scale: 60.6% self confident in doing activity without losing balance Berg: 43/56 (low fall risk), but significant challenge with turning to look behind, 360 turn, standing with 1 foot in front, and single leg stance. Tug: 10 sec without AD (normal) 360 Turn test: 5 sec to the R (increased fall risk); Pt also attempted to turn to the L requiring close SBA d/t very wide BOS and small balance check   04/11/22: 6 MWT: 720 ft (min guard with gait belt, followed by transport chair) BP and HR WNL before/after.    HAND DOMINANCE: Right   MOBILITY STATUS: difficulty carrying objects with ambulation; when more fatigued, pt shuffles and has anterior lean  POSTURE COMMENTS:  Anterior lean with ambulation , erect sitting and standing Sitting balance: Moves/returns truncal midpoint >2 inches in all planes  ACTIVITY TOLERANCE: Activity tolerance: increased shuffling when fatigued  FUNCTIONAL OUTCOME MEASURES: FOTO: To be assessed next session  UPPER EXTREMITY ROM    BUE ROM WNL   UPPER EXTREMITY MMT:    BUEs grossly 5/5  HAND FUNCTION: Grip strength: Right: 50 lbs; Left: 59 lbs, Lateral pinch: Right: 17 lbs, Left: 17 lbs, and 3 point pinch: Right: 13 lbs, Left: 12 lbs  COORDINATION: Finger Nose Finger test: WFL bilat 9 Hole Peg test: Right: 26 sec; Left: 35 sec   TODAY'S TREATMENT: Neuro re-ed:  LSVT: Patient seen for LSVT Daily Session Maximal Daily Exercises (in the adapted/standing position today with chair for UE support) for facilitation/coordination of movement. Maximum Sustained Movements are designed to rescale the amplitude of movement output for generalization to daily functional activities. Performed as follows for 1 set of 10 repetitions each multi-directional sustained movements. 1) Floor to ceiling  2) Side to side multidirectional    Repetitive movements performed in standing (performed in the adapted/standing position today with chair for UE support) and are designed to provide retraining effort needed for sustained muscle activation in tasks. Performed as follows for 1 set of 10 repetitions each of multi-directional repetitive movements: 3) Step and reach forward step- 4) Step and reach sideways step-   5) Step and reach backwards step- 6) Rock and reach forward/backward-  7) Rock and reach sideways-   Wall stretch completed for erect posture: 3 reps of 1 min hold, tactile and vc for slight chin tuck and posterior shift of head to wall, shoulders back.    Functional Component Task: 1.Sit to stand with supervision 5 reps, min vc for big reach upon sitting and standing.  Able to perform without support of chair upon standing. 2. Buttoning/unbuttoning small buttons on a shirt: managed 6 buttons x3 sets.  Min vc for 10 finger flicks with reminder to perform flicks at home before FM tasks as needed for reducing "catching" feeling that pt sometimes reports that he feels in his fingers.  Good return demo.  Hierarchy Tasks 3. Carrying a full  laundry basket (not yet completed) 4. Walking up/down a ramp; min guard with cane and max vc for increasing foot clearance with each step (cues to think about marching), netural head, slow pace, and erect posture when amb down ramp.  5 reps up/down ramp. 5. Transfer in/out of car x3 reps, min vc for exaggerated lift of each leg when transferring in/out of the car.   PATIENT EDUCATION: Education details: Maximal daily exercises in the adapted/standing position, ramp negotiation, car transfer technique. Person educated: pt Education method: explanation, demo, written handout Education comprehension: verbalized and demonstrated understanding with verbal and tactile cues   HOME EXERCISE PROGRAM: Pt given daily carryover task of maximal daily exercises in the adapted position (in  standing with chair for UE support), Big walking; add seated marches to HEP for increasing foot clearance for Big walking. GOALS: Goals reviewed with patient? Yes  SHORT TERM GOALS: Target date: 03/30/22  Pt will perform maximal daily exercise with min vc and handout. Baseline:  Will initiate next visit Goal status: INITIAL  2.  Pt will turn in place (360*) <4 sec with good stability. Baseline: 5 sec, requires close supv d/t balance checks; performs with wide BOS. Goal status: INITIAL  3.  Pt will don a tight sock (compression sock) with modified indep Baseline: Pt reports difficulty donning "tight" socks Goal status: INITIAL  LONG TERM GOALS: Target date: 05/11/22  Pt will increase FOTO score by 5 or more points to indicate increase in perceived functional performance with daily tasks.  Baseline: Will complete FOTO next session Goal status: INITIAL  2.  Pt will safely carry a loaded box or basket with good stability and reduced FOF. Baseline: Requires close supv-min A  Goal status: INITIAL  3.  Pt will increase level of confidence to 70% (confidence that pt will not lose balance) when walking in a crowd where people rapidly walk by.   Baseline: 50% confident Goal status: INITIAL  4.  Pt will confidently walk up/down a ramp with use of rail with modified indep. Baseline: Requires min A to amb down a ramp, close supv to amb up ramp Goal status: INITIAL  5.  Pt will increase dexterity in bilat hands to manage clothing fasteners more efficiently. Baseline: difficulty with fastening small buttons  Goal status: INITIAL  6.  Pt will increase level of confidence to 70% (confidence that pt will not lose balance) when transferring in/out of a car. Baseline: 50% confident Goal status: INITIAL  ASSESSMENT:  CLINICAL IMPRESSION: Pt reported that his back continues to loosen up a little.  Back is still catching intermittently during transfers or walking, so gait belt is used to  reduce fall risk.  Pt continues to require frequent but short rest breaks with maximal daily exercises.  Pt continues to require max vc and tactile cues for form, and coordinating arm/leg movements correctly with each exercise.  Pt required fewer cues for big hand movements, but repeated max vc for picking up feet instead of sliding them during step and reach exercises.  Used transport chair from clinic to ramp upstairs to allow for rest break before ambulating up/down ramp.  With walking up/down ramp, pt requires min guard with cane, and max vc and tactile cues for neutral head (pt looks down at floor without cues), shoulders back, and foot clearance to avoid shuffling.  With transitions from maximal daily exercises, to Big walking, and ramp training, pt remembers to exaggerate his steps to clear feet and prevent shuffling, but requires  max vc to maintain, and shuffling is very significant by 2nd and 3rd set of walking up/down ramp.  Pt will continue to benefit from skilled OT for progression of maximal daily exercises, hierarchy tasks, and functional component tasks for an overall goal of calibrating movements for pt to automatically use bigger/larger amplitude movements in everyday living, as well as to improve strength and balance and decrease risk of falling.  PERFORMANCE DEFICITS in functional skills including ADLs, IADLs, coordination, dexterity, proprioception, sensation, pain, FMC, GMC, mobility, balance, body mechanics, endurance, decreased knowledge of precautions, decreased knowledge of use of DME, and vision, cognitive skills including attention, memory, safety awareness, and thought. IMPAIRMENTS are limiting patient from ADLs, IADLs, rest and sleep, leisure, and social participation.   COMORBIDITIES may have co-morbidities  that affects occupational performance. Patient will benefit from skilled OT to address above impairments and improve overall function.  MODIFICATION OR ASSISTANCE TO COMPLETE  EVALUATION: Min-Moderate modification of tasks or assist with assess necessary to complete an evaluation.  OT OCCUPATIONAL PROFILE AND HISTORY: Problem focused assessment: Including review of records relating to presenting problem.  CLINICAL DECISION MAKING: Moderate - several treatment options, min-mod task modification necessary  REHAB POTENTIAL: Good  EVALUATION COMPLEXITY: Moderate    PLAN: OT FREQUENCY: 2x/week  OT DURATION: 12 weeks  PLANNED INTERVENTIONS: self care/ADL training, therapeutic exercise, therapeutic activity, neuromuscular re-education, gait training, balance training, functional mobility training, patient/family education, cognitive remediation/compensation, energy conservation, and DME and/or AE instructions  RECOMMENDED OTHER SERVICES: N/A  CONSULTED AND AGREED WITH PLAN OF CARE: Patient and family member/caregiver  PLAN FOR NEXT SESSION: Initiate maximal daily exercises, initiate functional component tasks, and hierarchy tasks   Leta Speller, MS, OTR/L  Darleene Cleaver, OT 04/17/2022, 6:27 PM

## 2022-04-17 NOTE — Therapy (Addendum)
OUTPATIENT OCCUPATIONAL THERAPY NEURO/LSVT BIG TREATMENT NOTE  Patient Name: Carlos Mathis MRN: [id number] DOB:13-May-1941, 81 y.o., male Today's Date: 04/17/2022  PCP: Dr. Frazier Richards REFERRING PROVIDER: Dr. Jennings Books   OT End of Session - 04/17/22 1801     Visit Number 3    Number of Visits 17    Date for OT Re-Evaluation 05/11/22    OT Start Time 0830    OT Stop Time 0930    OT Time Calculation (min) 60 min    Equipment Utilized During Treatment [facility]    Activity Tolerance Patient tolerated treatment well    Behavior During Therapy WFL for tasks assessed/performed             Past Medical History:  Diagnosis Date   Arthritis    BPH (benign prostatic hyperplasia)    CKD (chronic kidney disease), stage III ([place])    Coronary artery disease    GERD (gastroesophageal reflux disease)    History of hiatal hernia    HLD (hyperlipidemia)    Hypertension    Long term current use of antithrombotics/antiplatelets    a.) DAPT therapy (ASA+ ticagrelor)   MCI (mild cognitive impairment) with memory loss    a.) takes apoaequorin   Migraines    OSA on CPAP    Pneumonia    Skin cancer, basal cell    STEMI (ST elevation myocardial infarction) ([place]) 07/27/2020   a.) MI while living in [place]; underwent PCI placing 4.0 x 18 mm and 3.5 x 38 mm Resolute Onyx DES (vessels unknown/unspecified).   T2DM (type 2 diabetes mellitus) ([place])    Past Surgical History:  Procedure Laterality Date   CARDIAC CATHETERIZATION     CATARACT EXTRACTION Right    HERNIA REPAIR Left    inguinal   INSERTION OF MESH  09/01/2021   Procedure: INSERTION OF MESH;  Surgeon: Herbert Pun, MD;  Location: [facility] ORS;  Service: General;;   JOINT REPLACEMENT     tka   UMBILICAL HERNIA REPAIR     There are no problems to display for this patient.   ONSET DATE: 01/31/22 (date of referral)  REFERRING DIAG: Parkinson's Disease  THERAPY DIAG:  Muscle weakness (generalized)  Other lack of  coordination  Parkinson's disease with dyskinesia and fluctuating manifestations  Rationale for Evaluation and Treatment Rehabilitation  SUBJECTIVE:   SUBJECTIVE STATEMENT: Pt reports he thinks the exercises are helping to loosen up his back a little bit.   Pt accompanied by: self  PERTINENT HISTORY: Per chart, Memory Loss: concerns for Lewy body dementia in a patient with episodic confusion, parkinsonism, dream enactment, hyposmia, visual hallucinations, behavioral changes. Memory loss starting 2018 with cognitive decline over 2022. Patient reports difficulty remembering conversations, difficulty recognizing old friends, getting lost while driving. Per spouse patient can become aggressive behind the wheel. Patient reports visual hallucinations (dark areas out of his periphery) and audio hallucinations. Wife assists with medications and finances. Has become more emotional over the last year and sometimes becomes angry with spouse which is unusual. Denies alcohol use or difficulty sleeping. Significant procrastiantion, low motivation.    PRECAUTIONS: Fall  WEIGHT BEARING RESTRICTIONS No  PAIN:  Are you having pain? Yes: NPRS scale: 2/10 Pain location: low back Pain description: spasm, grabbing Aggravating factors: standing/walking Relieving factors: heat, rest, pain meds  FALLS: Has patient fallen in last 6 months? No, but several stumbles; Pt reports he tends to lose his balance forward and struggles to walk down a ramp.  Pt states he's  been caught by another person to prevent a fall while walking down a ramp.  LIVING ENVIRONMENT: Lives with: lives with their spouse Lives in: Other house, 2 levels (upstairs is bonus room but pt rarely goes up there) Stairs: Yes: External: 4 steps; bilateral but cannot reach both Has following equipment at home: Single point cane, uses in community settings in a crowd  PLOF:  retired from [employer], [employer]  PATIENT GOALS "To get to where I  can stand up without falling down.  Get better balance."   OBJECTIVE:  ABC Scale: 60.6% self confident in doing activity without losing balance Berg: 43/56 (low fall risk), but significant challenge with turning to look behind, 360 turn, standing with 1 foot in front, and single leg stance. Tug: 10 sec without AD (normal) 360 Turn test: 5 sec to the R (increased fall risk); Pt also attempted to turn to the L requiring close SBA d/t very wide BOS and small balance check   04/11/22: 6 MWT: 720 ft (min guard with gait belt, followed by transport chair) BP and HR WNL before/after.    HAND DOMINANCE: Right   MOBILITY STATUS: difficulty carrying objects with ambulation; when more fatigued, pt shuffles and has anterior lean  POSTURE COMMENTS:  Anterior lean with ambulation , erect sitting and standing Sitting balance: Moves/returns truncal midpoint >2 inches in all planes  ACTIVITY TOLERANCE: Activity tolerance: increased shuffling when fatigued  FUNCTIONAL OUTCOME MEASURES: FOTO: To be assessed next session  UPPER EXTREMITY ROM    BUE ROM WNL   UPPER EXTREMITY MMT:    BUEs grossly 5/5  HAND FUNCTION: Grip strength: Right: 50 lbs; Left: 59 lbs, Lateral pinch: Right: 17 lbs, Left: 17 lbs, and 3 point pinch: Right: 13 lbs, Left: 12 lbs  COORDINATION: Finger Nose Finger test: WFL bilat 9 Hole Peg test: Right: 26 sec; Left: 35 sec   TODAY'S TREATMENT: Neuro re-ed:  LSVT: Patient seen for LSVT Daily Session Maximal Daily Exercises (in the adapted/standing position today with chair for UE support) for facilitation/coordination of movement. Maximum Sustained Movements are designed to rescale the amplitude of movement output for generalization to daily functional activities. Performed as follows for 1 set of 10 repetitions each multi-directional sustained movements in the seated position. 1) Floor to ceiling  2) Side to side multidirectional   Repetitive movements performed in  standing (performed in the adapted/standing position today with chair for UE support) and are designed to provide retraining effort needed for sustained muscle activation in tasks. Performed as follows for 1 set of 10 repetitions each of multi-directional repetitive movements: 3) Step and reach forward step- 4) Step and reach sideways step-   5) Step and reach backwards step- 6) Rock and reach forward/backward-  7) Rock and reach sideways-   Wall stretch completed for erect posture: 3 reps of 1 min hold, tactile and vc for slight chin tuck and posterior shift of head to wall, shoulders back.    Functional Component Task: 1.Sit to stand with supervision 5 reps, min vc for big reach upon sitting and standing 2. Buttoning/unbuttoning small buttons on a shirt: managed 6 buttons x3 sets.  Instructed in 10 finger flicks before set 4 and 5 and encouraged flicks in prep for FM tasks at home.  Good return demo.  Hierarchy Tasks 3. Carrying a full laundry basket (not yet addressed) 4. Walking up/down a ramp; min guard with cane and max vc for increasing foot clearance with each step (cues to think about  marching), neutral head, slow pace, and erect posture when amb down ramp.  3 reps up/down ramp. 5. Transfer in/out of car x3 reps, vc for exaggerated lift of each leg when transferring in/out of the car.  Pt given daily carryover task of maximal daily exercises in the adapted position (in standing with chair for UE support) and Big walking.  PATIENT EDUCATION: Education details: Maximal daily exercises in the adapted/seated position, ramp negotiation, car transfer technique. Person educated: pt Education method: explanation, demo, written handout Education comprehension: verbalized and demonstrated understanding with verbal and tactile cues   HOME EXERCISE PROGRAM: Issued handout for maximal daily exercises in the adapted position (standing using chair for UE support)  GOALS: Goals reviewed with  patient? Yes  SHORT TERM GOALS: Target date: 03/30/22  Pt will perform maximal daily exercise with min vc and handout. Baseline:  Will initiate next visit Goal status: INITIAL  2.  Pt will turn in place (360*) <4 sec with good stability. Baseline: 5 sec, requires close supv d/t balance checks; performs with wide BOS. Goal status: INITIAL  3.  Pt will don a tight sock (compression sock) with modified indep Baseline: Pt reports difficulty donning "tight" socks Goal status: INITIAL  LONG TERM GOALS: Target date: 05/11/22  Pt will increase FOTO score by 5 or more points to indicate increase in perceived functional performance with daily tasks.  Baseline: Will complete FOTO next session Goal status: INITIAL  2.  Pt will safely carry a loaded box or basket with good stability and reduced FOF. Baseline: Requires close supv-min A  Goal status: INITIAL  3.  Pt will increase level of confidence to 70% (confidence that pt will not lose balance) when walking in a crowd where people rapidly walk by.   Baseline: 50% confident Goal status: INITIAL  4.  Pt will confidently walk up/down a ramp with use of rail with modified indep. Baseline: Requires min A to amb down a ramp, close supv to amb up ramp Goal status: INITIAL  5.  Pt will increase dexterity in bilat hands to manage clothing fasteners more efficiently. Baseline: difficulty with fastening small buttons  Goal status: INITIAL  6.  Pt will increase level of confidence to 70% (confidence that pt will not lose balance) when transferring in/out of a car. Baseline: 50% confident Goal status: INITIAL  ASSESSMENT:  CLINICAL IMPRESSION: Pt reported that his back had loosened up a little after his OT session yesterday with the maximal daily exercises.  Pt reported that his back is still catching, but not as badly.  Pt was able to progress to completion of maximal daily exercises in the standing position today, using chair for UE support.   Pt required max vc and tactile cues for form, and coordination arm/leg movements correctly with each exercise.  Pt required fewer cues for big hand movements, but repeated max vc for picking up feet instead of sliding them during step and reach exercises.  Used transport chair from clinic to ramp upstairs to allow for rest break before ambulating up/down ramp.  With walking up/down ramp, pt requires min guard with cane, and max vc and tactile cues for neutral head, shoulders back, and foot clearance to avoid shuffling.  Pt's pace increased down the ramp, requiring min A for control and stability, but improved with cues for erect posture to prevent the feeling of falling forward down the ramp.  Pt reports heavy legs with car transfers.  Feet tend to scuff the side of the  car when stepping into the car d/t poor foot clearance.  Pt had good carryover with cues for Big stepping into the car for foot clearance.  Pt will continue to benefit from skilled OT for progression of maximal daily exercises, hierarchy tasks, and functional component tasks for an overall goal of calibrating movements for pt to automatically use bigger/larger amplitude movements in everyday living, as well as to improve strength and balance and decrease risk of falling.  PERFORMANCE DEFICITS in functional skills including ADLs, IADLs, coordination, dexterity, proprioception, sensation, pain, FMC, GMC, mobility, balance, body mechanics, endurance, decreased knowledge of precautions, decreased knowledge of use of DME, and vision, cognitive skills including attention, memory, safety awareness, and thought. IMPAIRMENTS are limiting patient from ADLs, IADLs, rest and sleep, leisure, and social participation.   COMORBIDITIES may have co-morbidities  that affects occupational performance. Patient will benefit from skilled OT to address above impairments and improve overall function.  MODIFICATION OR ASSISTANCE TO COMPLETE EVALUATION: Min-Moderate  modification of tasks or assist with assess necessary to complete an evaluation.  OT OCCUPATIONAL PROFILE AND HISTORY: Problem focused assessment: Including review of records relating to presenting problem.  CLINICAL DECISION MAKING: Moderate - several treatment options, min-mod task modification necessary  REHAB POTENTIAL: Good  EVALUATION COMPLEXITY: Moderate    PLAN: OT FREQUENCY: 2x/week  OT DURATION: 12 weeks  PLANNED INTERVENTIONS: self care/ADL training, therapeutic exercise, therapeutic activity, neuromuscular re-education, gait training, balance training, functional mobility training, patient/family education, cognitive remediation/compensation, energy conservation, and DME and/or AE instructions  RECOMMENDED OTHER SERVICES: N/A  CONSULTED AND AGREED WITH PLAN OF CARE: Patient and family member/caregiver  PLAN FOR NEXT SESSION: Initiate maximal daily exercises, initiate functional component tasks, and hierarchy tasks   Leta Speller, MS, OTR/L  Darleene Cleaver, OT 04/17/2022, 6:03 PM

## 2022-04-19 ENCOUNTER — Ambulatory Visit: Payer: Medicare Other

## 2022-04-19 DIAGNOSIS — M6281 Muscle weakness (generalized): Secondary | ICD-10-CM

## 2022-04-19 DIAGNOSIS — R278 Other lack of coordination: Secondary | ICD-10-CM

## 2022-04-19 DIAGNOSIS — G20B1 Parkinson's disease with dyskinesia, without mention of fluctuations: Secondary | ICD-10-CM | POA: Diagnosis not present

## 2022-04-19 DIAGNOSIS — G20B2 Parkinson's disease with dyskinesia, with fluctuations: Secondary | ICD-10-CM

## 2022-04-19 NOTE — Therapy (Signed)
OUTPATIENT OCCUPATIONAL THERAPY NEURO/LSVT BIG TREATMENT NOTE  Patient Name: Carlos Mathis MRN: [id number] DOB:01-Oct-1940, 81 y.o., male Today's Date: 04/19/2022  PCP: Dr. Frazier Richards REFERRING PROVIDER: Dr. Jennings Books   OT End of Session - 04/19/22 1442     Visit Number 6    Number of Visits 17    Date for OT Re-Evaluation 05/11/22    OT Start Time 0830    OT Stop Time 0930    OT Time Calculation (min) 60 min    Equipment Utilized During Treatment [facility]    Activity Tolerance Patient tolerated treatment well    Behavior During Therapy WFL for tasks assessed/performed             Past Medical History:  Diagnosis Date   Arthritis    BPH (benign prostatic hyperplasia)    CKD (chronic kidney disease), stage III ([place])    Coronary artery disease    GERD (gastroesophageal reflux disease)    History of hiatal hernia    HLD (hyperlipidemia)    Hypertension    Long term current use of antithrombotics/antiplatelets    a.) DAPT therapy (ASA+ ticagrelor)   MCI (mild cognitive impairment) with memory loss    a.) takes apoaequorin   Migraines    OSA on CPAP    Pneumonia    Skin cancer, basal cell    STEMI (ST elevation myocardial infarction) ([place]) 07/27/2020   a.) MI while living in [place]; underwent PCI placing 4.0 x 18 mm and 3.5 x 38 mm Resolute Onyx DES (vessels unknown/unspecified).   T2DM (type 2 diabetes mellitus) ([place])    Past Surgical History:  Procedure Laterality Date   CARDIAC CATHETERIZATION     CATARACT EXTRACTION Right    HERNIA REPAIR Left    inguinal   INSERTION OF MESH  09/01/2021   Procedure: INSERTION OF MESH;  Surgeon: Herbert Pun, MD;  Location: [facility] ORS;  Service: General;;   JOINT REPLACEMENT     tka   UMBILICAL HERNIA REPAIR     There are no problems to display for this patient.   ONSET DATE: 01/31/22 (date of referral)  REFERRING DIAG: Parkinson's Disease  THERAPY DIAG:  Muscle weakness (generalized)  Other lack of  coordination  Parkinson's disease with dyskinesia and fluctuating manifestations  Rationale for Evaluation and Treatment Rehabilitation  SUBJECTIVE:   SUBJECTIVE STATEMENT: Pt was pleased to be able to make it down to the therapy clinic today with his cane and no transport chair.  Pt accompanied by: self  PERTINENT HISTORY: Per chart, Memory Loss: concerns for Lewy body dementia in a patient with episodic confusion, parkinsonism, dream enactment, hyposmia, visual hallucinations, behavioral changes. Memory loss starting 2018 with cognitive decline over 2022. Patient reports difficulty remembering conversations, difficulty recognizing old friends, getting lost while driving. Per spouse patient can become aggressive behind the wheel. Patient reports visual hallucinations (dark areas out of his periphery) and audio hallucinations. Wife assists with medications and finances. Has become more emotional over the last year and sometimes becomes angry with spouse which is unusual. Denies alcohol use or difficulty sleeping. Significant procrastiantion, low motivation.    PRECAUTIONS: Fall  WEIGHT BEARING RESTRICTIONS No  PAIN:  Are you having pain? Yes: NPRS scale: 2/10 Pain location: low back Pain description: spasm, grabbing Aggravating factors: standing/walking Relieving factors: heat, rest, pain meds  FALLS: Has patient fallen in last 6 months? No, but several stumbles; Pt reports he tends to lose his balance forward and struggles to walk down a  ramp.  Pt states he's been caught by another person to prevent a fall while walking down a ramp.  LIVING ENVIRONMENT: Lives with: lives with their spouse Lives in: Other house, 2 levels (upstairs is bonus room but pt rarely goes up there) Stairs: Yes: External: 4 steps; bilateral but cannot reach both Has following equipment at home: Single point cane, uses in community settings in a crowd  PLOF:  retired from [employer], [employer]  PATIENT GOALS  "To get to where I can stand up without falling down.  Get better balance."   OBJECTIVE:  ABC Scale: 60.6% self confident in doing activity without losing balance Berg: 43/56 (low fall risk), but significant challenge with turning to look behind, 360 turn, standing with 1 foot in front, and single leg stance. Tug: 10 sec without AD (normal) 360 Turn test: 5 sec to the R (increased fall risk); Pt also attempted to turn to the L requiring close SBA d/t very wide BOS and small balance check   04/11/22: 6 MWT: 720 ft (min guard with gait belt, followed by transport chair) BP and HR WNL before/after.    HAND DOMINANCE: Right   MOBILITY STATUS: difficulty carrying objects with ambulation; when more fatigued, pt shuffles and has anterior lean  POSTURE COMMENTS:  Anterior lean with ambulation , erect sitting and standing Sitting balance: Moves/returns truncal midpoint >2 inches in all planes  ACTIVITY TOLERANCE: Activity tolerance: increased shuffling when fatigued  FUNCTIONAL OUTCOME MEASURES: FOTO: To be assessed next session  UPPER EXTREMITY ROM    BUE ROM WNL   UPPER EXTREMITY MMT:    BUEs grossly 5/5  HAND FUNCTION: Grip strength: Right: 50 lbs; Left: 59 lbs, Lateral pinch: Right: 17 lbs, Left: 17 lbs, and 3 point pinch: Right: 13 lbs, Left: 12 lbs  COORDINATION: Finger Nose Finger test: WFL bilat 9 Hole Peg test: Right: 26 sec; Left: 35 sec   TODAY'S TREATMENT: Neuro re-ed:  LSVT: Patient seen for LSVT Daily Session Maximal Daily Exercises (in the adapted/standing position today with chair for UE support) for facilitation/coordination of movement. Maximum Sustained Movements are designed to rescale the amplitude of movement output for generalization to daily functional activities. Performed as follows for 1 set of 10 repetitions each multi-directional sustained movements. 1) Floor to ceiling (improving reach to floor) 2) Side to side multidirectional (tactile cues for  outstretched back leg 50% of the time)  Repetitive movements performed in standing (performed in the adapted/standing position today with chair for UE support) and are designed to provide retraining effort needed for sustained muscle activation in tasks. Performed as follows for 1 set of 10 repetitions each of multi-directional repetitive movements: 3) Step and reach forward step- 4) Step and reach sideways step-   5) Step and reach backwards step- 6) Rock and reach forward/backward-  7) Rock and reach sideways-   Wall stretch completed for erect posture: 3 reps of 1 min hold, tactile and vc for slight chin tuck and posterior shift of head to wall, shoulders back.   Completed seated marches x3 sets 10 reps each, using hands outstretched for a target for knees to increase lift height.  Encouraged pt complete these at home daily to work towards increased foot clearance for Big walking and car transfer.  Pt verbalized understanding. Big walking outside on sidewalk with close supv, several feet on grassy area (min guard) to reach a bench for seated rest break: cues for neutral head, foot clearance with simulated marching, and erect standing  posture.  Pt currently performs 1 technique at a time, not all 3.    Functional Component Task: 1.Sit to stand with supervision 10 reps, min vc for big reach upon sitting and standing.  Able to perform without support of chair upon standing. 2. Buttoning/unbuttoning small buttons on a shirt: managed 6 buttons x3 sets.  Unable to recall compensatory warm up method for fingers to prep for a FM task being finger flicks; vc to perform 10 finger flicks before buttoning today; good return demo.  Hierarchy Tasks 3. Carrying a full laundry basket (not yet completed) 4. Walking up/down a ramp; close supv with cane up ramp, min guard with cane down the ramp; max vc for increasing foot clearance with each step (cues to think about marching), neutral head, slow pace, and  erect posture when amb down ramp.  5 reps up/down ramp. 5. Transfer into car 1x, min vc for exaggerated lift of each leg when transferring in/out of the car; good foot clearance for BLEs.   PATIENT EDUCATION: Education details: Maximal daily exercises in the adapted/standing position, ramp negotiation, car transfer technique. Person educated: pt Education method: explanation, demo, written handout Education comprehension: verbalized and demonstrated understanding with verbal and tactile cues   HOME EXERCISE PROGRAM: Pt given daily carryover task of maximal daily exercises in the adapted position (in standing with chair for UE support), Big walking, and seated marches.  GOALS: Goals reviewed with patient? Yes  SHORT TERM GOALS: Target date: 03/30/22  Pt will perform maximal daily exercise with min vc and handout. Baseline:  Will initiate next visit Goal status: INITIAL  2.  Pt will turn in place (360*) <4 sec with good stability. Baseline: 5 sec, requires close supv d/t balance checks; performs with wide BOS. Goal status: INITIAL  3.  Pt will don a tight sock (compression sock) with modified indep Baseline: Pt reports difficulty donning "tight" socks Goal status: INITIAL  LONG TERM GOALS: Target date: 05/11/22  Pt will increase FOTO score by 5 or more points to indicate increase in perceived functional performance with daily tasks.  Baseline: Will complete FOTO next session Goal status: INITIAL  2.  Pt will safely carry a loaded box or basket with good stability and reduced FOF. Baseline: Requires close supv-min A  Goal status: INITIAL  3.  Pt will increase level of confidence to 70% (confidence that pt will not lose balance) when walking in a crowd where people rapidly walk by.   Baseline: 50% confident Goal status: INITIAL  4.  Pt will confidently walk up/down a ramp with use of rail with modified indep. Baseline: Requires min A to amb down a ramp, close supv to amb up  ramp Goal status: INITIAL  5.  Pt will increase dexterity in bilat hands to manage clothing fasteners more efficiently. Baseline: difficulty with fastening small buttons  Goal status: INITIAL  6.  Pt will increase level of confidence to 70% (confidence that pt will not lose balance) when transferring in/out of a car. Baseline: 50% confident Goal status: INITIAL  ASSESSMENT:  CLINICAL IMPRESSION: Pt was pleased to be able to make it down to the therapy clinic today with his cane and no transport chair.  Back is still catching intermittently during transfers or walking, so gait belt is used to reduce fall risk, but reach to floor is improving with floor to ceiling exercise.  With sideways rock and reach, pt has little rotation at hips and shoulders, noting inability to look behind him  in either direction.  Pt required fewer seated rest breaks with maximal daily exercises this date.  Pt continues to require max vc and tactile cues for form, and coordinating arm/leg movements correctly with each exercise.  Pt requiring fewer cues for big hand movements, and fewer vc (mod vc today) for picking up feet instead of sliding them during step and reach exercises.  Pt did not require transport chair today to move from basement to first floor for ramp practice.  With walking up/down ramp, pt requires min guard with cane, and max vc and tactile cues for neutral head (pt looks down at floor without cues), shoulders back, and foot clearance to avoid shuffling.  Increased shuffling continues to be present with fatigue.  With big walking, pt continues to require cues for foot clearance, neutral head position, and erect posture; pt currently performs 1 technique at a time, not all 3.  Pt reported that he tried his maximal daily exercises last night, but stated that he wasn't sure how well he performed them.  OT provided positive reinforcement for the fact that he attempted them on his own, and encouraged pt that they  will get easier with repetition.  Pt reported that he feels like he looks funny when he picks up his feet to walk, but OT reinforced that these bigger movements will feel unnatural at first, become more natural with repetition, and that these movements are normal movement patterns despite the awkwardness that pt feels.  OT provided education on program goal of recalibrating his movements.  Pt will continue to benefit from skilled OT for progression of maximal daily exercises, hierarchy tasks, and functional component tasks for an overall goal of calibrating movements for pt to automatically use bigger/larger amplitude movements in everyday living, as well as to improve strength and balance and decrease risk of falling.  PERFORMANCE DEFICITS in functional skills including ADLs, IADLs, coordination, dexterity, proprioception, sensation, pain, FMC, GMC, mobility, balance, body mechanics, endurance, decreased knowledge of precautions, decreased knowledge of use of DME, and vision, cognitive skills including attention, memory, safety awareness, and thought. IMPAIRMENTS are limiting patient from ADLs, IADLs, rest and sleep, leisure, and social participation.   COMORBIDITIES may have co-morbidities  that affects occupational performance. Patient will benefit from skilled OT to address above impairments and improve overall function.  MODIFICATION OR ASSISTANCE TO COMPLETE EVALUATION: Min-Moderate modification of tasks or assist with assess necessary to complete an evaluation.  OT OCCUPATIONAL PROFILE AND HISTORY: Problem focused assessment: Including review of records relating to presenting problem.  CLINICAL DECISION MAKING: Moderate - several treatment options, min-mod task modification necessary  REHAB POTENTIAL: Good  EVALUATION COMPLEXITY: Moderate    PLAN: OT FREQUENCY: 2x/week  OT DURATION: 12 weeks  PLANNED INTERVENTIONS: self care/ADL training, therapeutic exercise, therapeutic activity,  neuromuscular re-education, gait training, balance training, functional mobility training, patient/family education, cognitive remediation/compensation, energy conservation, and DME and/or AE instructions  RECOMMENDED OTHER SERVICES: N/A  CONSULTED AND AGREED WITH PLAN OF CARE: Patient and family member/caregiver  PLAN FOR NEXT SESSION: Initiate maximal daily exercises, initiate functional component tasks, and hierarchy tasks   Leta Speller, MS, OTR/L  Darleene Cleaver, OT 04/19/2022, 2:43 PM

## 2022-04-20 ENCOUNTER — Ambulatory Visit: Payer: Medicare Other | Attending: Neurology

## 2022-04-20 DIAGNOSIS — R2681 Unsteadiness on feet: Secondary | ICD-10-CM | POA: Insufficient documentation

## 2022-04-20 DIAGNOSIS — R278 Other lack of coordination: Secondary | ICD-10-CM | POA: Insufficient documentation

## 2022-04-20 DIAGNOSIS — G20B1 Parkinson's disease with dyskinesia, without mention of fluctuations: Secondary | ICD-10-CM | POA: Diagnosis not present

## 2022-04-20 DIAGNOSIS — M6281 Muscle weakness (generalized): Secondary | ICD-10-CM | POA: Insufficient documentation

## 2022-04-21 ENCOUNTER — Ambulatory Visit: Payer: Medicare Other

## 2022-04-21 DIAGNOSIS — R2681 Unsteadiness on feet: Secondary | ICD-10-CM

## 2022-04-21 DIAGNOSIS — G20B1 Parkinson's disease with dyskinesia, without mention of fluctuations: Secondary | ICD-10-CM

## 2022-04-21 DIAGNOSIS — R278 Other lack of coordination: Secondary | ICD-10-CM

## 2022-04-21 NOTE — Therapy (Signed)
OUTPATIENT OCCUPATIONAL THERAPY NEURO/LSVT BIG TREATMENT NOTE  Patient Name: Carlos Mathis MRN: [id number] DOB:[birth year], 81 y.o., male Today's Date: 04/21/2022  PCP: Dr. Frazier Richards REFERRING PROVIDER: Dr. Jennings Books   OT End of Session - 04/21/22 1242     Visit Number 7    Number of Visits 17    Date for OT Re-Evaluation 05/11/22    OT Start Time 0830    OT Stop Time 0930    OT Time Calculation (min) 60 min    Equipment Utilized During Treatment [facility]    Activity Tolerance Patient tolerated treatment well    Behavior During Therapy WFL for tasks assessed/performed             Past Medical History:  Diagnosis Date   Arthritis    BPH (benign prostatic hyperplasia)    CKD (chronic kidney disease), stage III ([place])    Coronary artery disease    GERD (gastroesophageal reflux disease)    History of hiatal hernia    HLD (hyperlipidemia)    Hypertension    Long term current use of antithrombotics/antiplatelets    a.) DAPT therapy (ASA+ ticagrelor)   MCI (mild cognitive impairment) with memory loss    a.) takes apoaequorin   Migraines    OSA on CPAP    Pneumonia    Skin cancer, basal cell    STEMI (ST elevation myocardial infarction) ([place]) 07/27/2020   a.) MI while living in [place]; underwent PCI placing 4.0 x 18 mm and 3.5 x 38 mm Resolute Onyx DES (vessels unknown/unspecified).   T2DM (type 2 diabetes mellitus) ([place])    Past Surgical History:  Procedure Laterality Date   CARDIAC CATHETERIZATION     CATARACT EXTRACTION Right    HERNIA REPAIR Left    inguinal   INSERTION OF MESH  09/01/2021   Procedure: INSERTION OF MESH;  Surgeon: Herbert Pun, MD;  Location: [facility] ORS;  Service: General;;   JOINT REPLACEMENT     tka   UMBILICAL HERNIA REPAIR     There are no problems to display for this patient.   ONSET DATE: 01/31/22 (date of referral)  REFERRING DIAG: Parkinson's Disease  THERAPY DIAG:  Parkinson's disease with dyskinesia, unspecified  whether manifestations fluctuate  Unsteadiness on feet  Other lack of coordination  Rationale for Evaluation and Treatment Rehabilitation  SUBJECTIVE:   SUBJECTIVE STATEMENT: Pt reports he's got a busy today today, leaving from therapy to go to a [employer].   Pt accompanied by: self  PERTINENT HISTORY: Per chart, Memory Loss: concerns for Lewy body dementia in a patient with episodic confusion, parkinsonism, dream enactment, hyposmia, visual hallucinations, behavioral changes. Memory loss starting 2018 with cognitive decline over 2022. Patient reports difficulty remembering conversations, difficulty recognizing old friends, getting lost while driving. Per spouse patient can become aggressive behind the wheel. Patient reports visual hallucinations (dark areas out of his periphery) and audio hallucinations. Wife assists with medications and finances. Has become more emotional over the last year and sometimes becomes angry with spouse which is unusual. Denies alcohol use or difficulty sleeping. Significant procrastiantion, low motivation.    PRECAUTIONS: Fall  WEIGHT BEARING RESTRICTIONS No  PAIN:  Are you having pain? Yes: NPRS scale: 2/10 Pain location: low back Pain description: spasm, grabbing Aggravating factors: standing/walking Relieving factors: heat, rest, pain meds  FALLS: Has patient fallen in last 6 months? No, but several stumbles; Pt reports he tends to lose his balance forward and struggles to walk down a ramp.  Pt  states he's been caught by another person to prevent a fall while walking down a ramp.  LIVING ENVIRONMENT: Lives with: lives with their spouse Lives in: Other house, 2 levels (upstairs is bonus room but pt rarely goes up there) Stairs: Yes: External: 4 steps; bilateral but cannot reach both Has following equipment at home: Single point cane, uses in community settings in a crowd  PLOF:  retired from [employer], [employer]  PATIENT GOALS "To get to  where I can stand up without falling down.  Get better balance."   OBJECTIVE:  ABC Scale: 60.6% self confident in doing activity without losing balance Berg: 43/56 (low fall risk), but significant challenge with turning to look behind, 360 turn, standing with 1 foot in front, and single leg stance. Tug: 10 sec without AD (normal) 360 Turn test: 5 sec to the R (increased fall risk); Pt also attempted to turn to the L requiring close SBA d/t very wide BOS and small balance check   04/11/22: 6 MWT: 720 ft (min guard with gait belt, followed by transport chair) BP and HR WNL before/after.    HAND DOMINANCE: Right   MOBILITY STATUS: difficulty carrying objects with ambulation; when more fatigued, pt shuffles and has anterior lean  POSTURE COMMENTS:  Anterior lean with ambulation , erect sitting and standing Sitting balance: Moves/returns truncal midpoint >2 inches in all planes  ACTIVITY TOLERANCE: Activity tolerance: increased shuffling when fatigued  FUNCTIONAL OUTCOME MEASURES: FOTO: To be assessed next session  UPPER EXTREMITY ROM    BUE ROM WNL   UPPER EXTREMITY MMT:    BUEs grossly 5/5  HAND FUNCTION: Grip strength: Right: 50 lbs; Left: 59 lbs, Lateral pinch: Right: 17 lbs, Left: 17 lbs, and 3 point pinch: Right: 13 lbs, Left: 12 lbs  COORDINATION: Finger Nose Finger test: WFL bilat 9 Hole Peg test: Right: 26 sec; Left: 35 sec   TODAY'S TREATMENT: Neuro re-ed:  LSVT: Patient seen for LSVT Daily Session Maximal Daily Exercises (in the adapted/standing position today with chair for UE support) for facilitation/coordination of movement. Maximum Sustained Movements are designed to rescale the amplitude of movement output for generalization to daily functional activities. Performed as follows for 1 set of 10 repetitions each multi-directional sustained movements. 1) Floor to ceiling (improving reach to floor) 2) Side to side multidirectional (tactile cues for outstretched  back leg 25% of the time)  Repetitive movements performed in standing (performed in the adapted/standing position today with chair for UE support) and are designed to provide retraining effort needed for sustained muscle activation in tasks. Performed as follows for 1 set of 10 repetitions each of multi-directional repetitive movements: 3) Step and reach forward step- mod vc to pick up L foot when stepping back to neutral 4) Step and reach sideways step- mod vc to pick up L foot when stepping back to neutral  5) Step and reach backwards step- mod vc to pick up L foot when stepping back to neutral 6) Rock and reach forward/backward- min tactile cues for big arm swing backward with return to erect posture during forward reach 7) Rock and reach sideways- max vc, mod tactile cues for big arms, palms up, and to look over the shoulder  Wall stretch completed for erect posture: 3 reps of 1 min hold, min tactile and vc for slight chin tuck and posterior shift of head to wall, shoulders back.   Completed seated marches x3 sets 15 reps each, using hands outstretched for a target for knees  to increase lift height.   Completed standing marches with min guard, cane in R hand tapping foot with each march on top of cone x3 sets 10 reps each foot. Facilitated Big walking with step over 4 cones x 5 reps back and forth, max repeated cues to prevent circumducted swing of foot around cone.  3-4 cones knocked over. Alternating steps on 1 stair x10 each, bilat hands rails, cues for exaggerated lift to avoid foot scuffing on edge of stair.  Functional Component Task: 1.Sit to stand with supervision 15 reps, min vc for big reach upon sitting and full erect standing with each rep.   2. Buttoning/unbuttoning small buttons on a shirt: x3 reps with 6 buttons.  Pt with good recall to perform finger flicks prior to [facility] activity without prompting.  Hierarchy Tasks 3. Carrying a full laundry basket (not yet completed) 4.  Walking up/down a ramp: not completed today (focus on Big walking) 5. Transfer into car: simulation only; reviewed exaggerated lift of each leg when transferring into/out of car; carryover seated marches daily at home to ease lifting legs up to car and with big walking  PATIENT EDUCATION: Education details: Maximal daily exercises in the adapted/standing position,car transfer technique. Person educated: pt Education method: explanation, demo, written handout Education comprehension: verbalized and demonstrated understanding with verbal and tactile cues   HOME EXERCISE PROGRAM: Pt given daily carryover task of maximal daily exercises in the adapted position (in standing with chair for UE support), Big walking, and seated marches.  GOALS: Goals reviewed with patient? Yes  SHORT TERM GOALS: Target date: 03/30/22  Pt will perform maximal daily exercise with min vc and handout. Baseline:  Will initiate next visit Goal status: INITIAL  2.  Pt will turn in place (360*) <4 sec with good stability. Baseline: 5 sec, requires close supv d/t balance checks; performs with wide BOS. Goal status: INITIAL  3.  Pt will don a tight sock (compression sock) with modified indep Baseline: Pt reports difficulty donning "tight" socks Goal status: INITIAL  LONG TERM GOALS: Target date: 05/11/22  Pt will increase FOTO score by 5 or more points to indicate increase in perceived functional performance with daily tasks.  Baseline: Will complete FOTO next session Goal status: INITIAL  2.  Pt will safely carry a loaded box or basket with good stability and reduced FOF. Baseline: Requires close supv-min A  Goal status: INITIAL  3.  Pt will increase level of confidence to 70% (confidence that pt will not lose balance) when walking in a crowd where people rapidly walk by.   Baseline: 50% confident Goal status: INITIAL  4.  Pt will confidently walk up/down a ramp with use of rail with modified  indep. Baseline: Requires min A to amb down a ramp, close supv to amb up ramp Goal status: INITIAL  5.  Pt will increase dexterity in bilat hands to manage clothing fasteners more efficiently. Baseline: difficulty with fastening small buttons  Goal status: INITIAL  6.  Pt will increase level of confidence to 70% (confidence that pt will not lose balance) when transferring in/out of a car. Baseline: 50% confident Goal status: INITIAL  ASSESSMENT:  CLINICAL IMPRESSION: Introduced standing marches with foot taps to cone, stepping over cones, and alternating steps on 1 stair to promote increased foot clearance with carry over to big walking.  Pt requires mod-max vc to avoid compensatory circumduction of leg to swing foot around cone, which he did 50% of the time.  3-4  cones knocked over during standing marches and walking over cones.  Pt is improving with stepping each foot back to middle with maximal daily exercises in the sustained and the repetitive multidirectional movements, now requiring mod vc.  Pt demonstrating improved rotation at torso, not yet able to look behind him but can slightly look over R shoulder during this movement, and slightly better when rotating to the L.  Pt will continue to benefit from skilled OT for progression of maximal daily exercises, hierarchy tasks, and functional component tasks for an overall goal of calibrating movements for pt to automatically use bigger/larger amplitude movements in everyday living, as well as to improve strength and balance and decrease risk of falling.  PERFORMANCE DEFICITS in functional skills including ADLs, IADLs, coordination, dexterity, proprioception, sensation, pain, FMC, GMC, mobility, balance, body mechanics, endurance, decreased knowledge of precautions, decreased knowledge of use of DME, and vision, cognitive skills including attention, memory, safety awareness, and thought. IMPAIRMENTS are limiting patient from ADLs, IADLs, rest and  sleep, leisure, and social participation.   COMORBIDITIES may have co-morbidities  that affects occupational performance. Patient will benefit from skilled OT to address above impairments and improve overall function.  MODIFICATION OR ASSISTANCE TO COMPLETE EVALUATION: Min-Moderate modification of tasks or assist with assess necessary to complete an evaluation.  OT OCCUPATIONAL PROFILE AND HISTORY: Problem focused assessment: Including review of records relating to presenting problem.  CLINICAL DECISION MAKING: Moderate - several treatment options, min-mod task modification necessary  REHAB POTENTIAL: Good  EVALUATION COMPLEXITY: Moderate    PLAN: OT FREQUENCY: 2x/week  OT DURATION: 12 weeks  PLANNED INTERVENTIONS: self care/ADL training, therapeutic exercise, therapeutic activity, neuromuscular re-education, gait training, balance training, functional mobility training, patient/family education, cognitive remediation/compensation, energy conservation, and DME and/or AE instructions  RECOMMENDED OTHER SERVICES: N/A  CONSULTED AND AGREED WITH PLAN OF CARE: Patient and family member/caregiver  PLAN FOR NEXT SESSION: Initiate maximal daily exercises, initiate functional component tasks, and hierarchy tasks   Leta Speller, MS, OTR/L  Darleene Cleaver, OT 04/21/2022, 12:46 PM

## 2022-04-21 NOTE — Therapy (Signed)
OUTPATIENT OCCUPATIONAL THERAPY NEURO/LSVT BIG TREATMENT NOTE  Patient Name: Carlos Mathis MRN: [id number] DOB:12/28/1940, 81 y.o., male Today's Date: 04/21/2022  PCP: Dr. Frazier Richards REFERRING PROVIDER: Dr. Jennings Books   OT End of Session - 04/21/22 1242     Visit Number 7    Number of Visits 17    Date for OT Re-Evaluation 05/11/22    OT Start Time 0830    OT Stop Time 0930    OT Time Calculation (min) 60 min    Equipment Utilized During Treatment [facility]    Activity Tolerance Patient tolerated treatment well    Behavior During Therapy WFL for tasks assessed/performed             Past Medical History:  Diagnosis Date   Arthritis    BPH (benign prostatic hyperplasia)    CKD (chronic kidney disease), stage III ([place])    Coronary artery disease    GERD (gastroesophageal reflux disease)    History of hiatal hernia    HLD (hyperlipidemia)    Hypertension    Long term current use of antithrombotics/antiplatelets    a.) DAPT therapy (ASA+ ticagrelor)   MCI (mild cognitive impairment) with memory loss    a.) takes apoaequorin   Migraines    OSA on CPAP    Pneumonia    Skin cancer, basal cell    STEMI (ST elevation myocardial infarction) ([place]) 07/27/2020   a.) MI while living in [place]; underwent PCI placing 4.0 x 18 mm and 3.5 x 38 mm Resolute Onyx DES (vessels unknown/unspecified).   T2DM (type 2 diabetes mellitus) ([place])    Past Surgical History:  Procedure Laterality Date   CARDIAC CATHETERIZATION     CATARACT EXTRACTION Right    HERNIA REPAIR Left    inguinal   INSERTION OF MESH  09/01/2021   Procedure: INSERTION OF MESH;  Surgeon: Herbert Pun, MD;  Location: [facility] ORS;  Service: General;;   JOINT REPLACEMENT     tka   UMBILICAL HERNIA REPAIR     There are no problems to display for this patient.   ONSET DATE: 01/31/22 (date of referral)  REFERRING DIAG: Parkinson's Disease  THERAPY DIAG:  Parkinson's disease with dyskinesia, unspecified  whether manifestations fluctuate  Unsteadiness on feet  Other lack of coordination  Rationale for Evaluation and Treatment Rehabilitation  SUBJECTIVE:   SUBJECTIVE STATEMENT: Pt arrived without his cane today.  Pt stated that some days he doesn't feel like he needs it and today is one of those days.   Pt accompanied by: self  PERTINENT HISTORY: Per chart, Memory Loss: concerns for Lewy body dementia in a patient with episodic confusion, parkinsonism, dream enactment, hyposmia, visual hallucinations, behavioral changes. Memory loss starting 2018 with cognitive decline over 2022. Patient reports difficulty remembering conversations, difficulty recognizing old friends, getting lost while driving. Per spouse patient can become aggressive behind the wheel. Patient reports visual hallucinations (dark areas out of his periphery) and audio hallucinations. Wife assists with medications and finances. Has become more emotional over the last year and sometimes becomes angry with spouse which is unusual. Denies alcohol use or difficulty sleeping. Significant procrastiantion, low motivation.    PRECAUTIONS: Fall  WEIGHT BEARING RESTRICTIONS No  PAIN:  Are you having pain? Yes: NPRS scale: 1-2/10 Pain location: low back Pain description: spasm, grabbing Aggravating factors: standing/walking Relieving factors: heat, rest, pain meds  FALLS: Has patient fallen in last 6 months? No, but several stumbles; Pt reports he tends to lose his balance forward  and struggles to walk down a ramp.  Pt states he's been caught by another person to prevent a fall while walking down a ramp.  LIVING ENVIRONMENT: Lives with: lives with their spouse Lives in: Other house, 2 levels (upstairs is bonus room but pt rarely goes up there) Stairs: Yes: External: 4 steps; bilateral but cannot reach both Has following equipment at home: Single point cane, uses in community settings in a crowd  PLOF:  retired from [employer], [employer]  PATIENT GOALS "To get to where I can stand up without falling down.  Get better balance."   OBJECTIVE:  ABC Scale: 60.6% self confident in doing activity without losing balance Berg: 43/56 (low fall risk), but significant challenge with turning to look behind, 360 turn, standing with 1 foot in front, and single leg stance. Tug: 10 sec without AD (normal) 360 Turn test: 5 sec to the R (increased fall risk); Pt also attempted to turn to the L requiring close SBA d/t very wide BOS and small balance check   04/11/22: 6 MWT: 720 ft (min guard with gait belt, followed by transport chair) BP and HR WNL before/after.    HAND DOMINANCE: Right   MOBILITY STATUS: difficulty carrying objects with ambulation; when more fatigued, pt shuffles and has anterior lean  POSTURE COMMENTS:  Anterior lean with ambulation , erect sitting and standing Sitting balance: Moves/returns truncal midpoint >2 inches in all planes  ACTIVITY TOLERANCE: Activity tolerance: increased shuffling when fatigued  FUNCTIONAL OUTCOME MEASURES: FOTO: To be assessed next session  UPPER EXTREMITY ROM    BUE ROM WNL   UPPER EXTREMITY MMT:    BUEs grossly 5/5  HAND FUNCTION: Grip strength: Right: 50 lbs; Left: 59 lbs, Lateral pinch: Right: 17 lbs, Left: 17 lbs, and 3 point pinch: Right: 13 lbs, Left: 12 lbs  COORDINATION: Finger Nose Finger test: WFL bilat 9 Hole Peg test: Right: 26 sec; Left: 35 sec   TODAY'S TREATMENT: Neuro re-ed:  LSVT: Patient seen for LSVT Daily Session Maximal Daily Exercises (in the adapted/standing position today with chair for UE support) for facilitation/coordination of movement. Maximum Sustained Movements are designed to rescale the amplitude of movement output for generalization to daily functional activities. Performed as follows for 1 set of 10 repetitions each multi-directional sustained movements. 1) Floor to ceiling (improving reach to floor) 2) Side to side  multidirectional (tactile cues for outstretched back leg 15% of the time), min vc today to step foot back to starting position rather than slide  Repetitive movements performed in standing (performed in the adapted/standing position today with chair for UE support) and are designed to provide retraining effort needed for sustained muscle activation in tasks. Performed as follows for 1 set of 10 repetitions each of multi-directional repetitive movements: 3) Step and reach forward step- min vc to pick up L foot when stepping back to neutral 4) Step and reach sideways step- min vc to pick up L foot when stepping back to neutral  5) Step and reach backwards step- min vc to pick up L foot when stepping back to neutral 6) Rock and reach forward/backward- min tactile cues for big arm swing backward with return to erect posture during forward reach 7) Rock and reach sideways- max vc, mod tactile cues for big arms, palms up, and to look over the shoulder; performed today without support of chair.  Wall stretch completed for erect posture: 3 reps of 1 min hold, min tactile and vc for slight chin  tuck and posterior shift of head to wall, shoulders back.   Completed seated marches x3 sets 20 reps each, using hands outstretched for a target for knees to increase lift height.  Knee lift decreases bilat by rep 15. Facilitated Big walking with step over 4 cones x 5 reps back and forth, max repeated cues to prevent circumducted swing of foot around cone.  0 cones knocked over today. 360 degree turns x3 each way with min guard within circle of cones.  Cues for picking up feet in each direction.  Functional Component Task: 1.Sit to stand with supervision 15 reps, min vc for big reach upon sitting and full erect standing with each rep.   2. Buttoning/unbuttoning small buttons on a shirt: x3 reps with 6 buttons.  Pt with good recall to perform finger flicks prior to [facility] activity without prompting.  Hierarchy Tasks 3.  Carrying a full laundry basket (not yet completed) 4. Walking up/down a ramp: not completed today (focus on Big walking) 5. Transfer into car: Not completed today  PATIENT EDUCATION: Education details: Maximal daily exercises in the adapted/standing position,car transfer technique. Person educated: pt Education method: explanation, demo, written handout Education comprehension: verbalized and demonstrated understanding with verbal and tactile cues   HOME EXERCISE PROGRAM: Pt given daily carryover task of maximal daily exercises in the adapted position (in standing with chair for UE support), Big walking, and seated marches.  GOALS: Goals reviewed with patient? Yes  SHORT TERM GOALS: Target date: 03/30/22  Pt will perform maximal daily exercise with min vc and handout. Baseline:  Will initiate next visit Goal status: INITIAL  2.  Pt will turn in place (360*) <4 sec with good stability. Baseline: 5 sec, requires close supv d/t balance checks; performs with wide BOS. Goal status: INITIAL  3.  Pt will don a tight sock (compression sock) with modified indep Baseline: Pt reports difficulty donning "tight" socks Goal status: INITIAL  LONG TERM GOALS: Target date: 05/11/22  Pt will increase FOTO score by 5 or more points to indicate increase in perceived functional performance with daily tasks.  Baseline: Will complete FOTO next session Goal status: INITIAL  2.  Pt will safely carry a loaded box or basket with good stability and reduced FOF. Baseline: Requires close supv-min A  Goal status: INITIAL  3.  Pt will increase level of confidence to 70% (confidence that pt will not lose balance) when walking in a crowd where people rapidly walk by.   Baseline: 50% confident Goal status: INITIAL  4.  Pt will confidently walk up/down a ramp with use of rail with modified indep. Baseline: Requires min A to amb down a ramp, close supv to amb up ramp Goal status: INITIAL  5.  Pt will  increase dexterity in bilat hands to manage clothing fasteners more efficiently. Baseline: difficulty with fastening small buttons  Goal status: INITIAL  6.  Pt will increase level of confidence to 70% (confidence that pt will not lose balance) when transferring in/out of a car. Baseline: 50% confident Goal status: INITIAL  ASSESSMENT:  CLINICAL IMPRESSION: Pt was able to increase seated marches from 15 to 20 reps today, though knee lift decreased in height by rep 15.  Pt continues to require less frequent cues for stepping L foot back to neutral/starting position instead of sliding with sustained and repetitive movements with maximal daily exercises.  Pt arrived without cane today as he stated that he was feeling good and didn't need it, so min  guard was provided with cone activities today.  Pt is demonstrating more erect posture with fewer cues during Big walking today, but ongoing cues for foot clearance to limit shuffling.  Pt will continue to benefit from skilled OT for progression of maximal daily exercises, hierarchy tasks, and functional component tasks for an overall goal of calibrating movements for pt to automatically use bigger/larger amplitude movements in everyday living, as well as to improve strength and balance and decrease risk of falling.  PERFORMANCE DEFICITS in functional skills including ADLs, IADLs, coordination, dexterity, proprioception, sensation, pain, FMC, GMC, mobility, balance, body mechanics, endurance, decreased knowledge of precautions, decreased knowledge of use of DME, and vision, cognitive skills including attention, memory, safety awareness, and thought. IMPAIRMENTS are limiting patient from ADLs, IADLs, rest and sleep, leisure, and social participation.   COMORBIDITIES may have co-morbidities  that affects occupational performance. Patient will benefit from skilled OT to address above impairments and improve overall function.  MODIFICATION OR ASSISTANCE TO  COMPLETE EVALUATION: Min-Moderate modification of tasks or assist with assess necessary to complete an evaluation.  OT OCCUPATIONAL PROFILE AND HISTORY: Problem focused assessment: Including review of records relating to presenting problem.  CLINICAL DECISION MAKING: Moderate - several treatment options, min-mod task modification necessary  REHAB POTENTIAL: Good  EVALUATION COMPLEXITY: Moderate    PLAN: OT FREQUENCY: 2x/week  OT DURATION: 12 weeks  PLANNED INTERVENTIONS: self care/ADL training, therapeutic exercise, therapeutic activity, neuromuscular re-education, gait training, balance training, functional mobility training, patient/family education, cognitive remediation/compensation, energy conservation, and DME and/or AE instructions  RECOMMENDED OTHER SERVICES: N/A  CONSULTED AND AGREED WITH PLAN OF CARE: Patient and family member/caregiver  PLAN FOR NEXT SESSION: Initiate maximal daily exercises, initiate functional component tasks, and hierarchy tasks   Leta Speller, MS, OTR/L  Darleene Cleaver, OT 04/21/2022, 12:46 PM

## 2022-04-22 ENCOUNTER — Ambulatory Visit: Payer: Medicare Other

## 2022-04-22 DIAGNOSIS — M6281 Muscle weakness (generalized): Secondary | ICD-10-CM

## 2022-04-22 DIAGNOSIS — R278 Other lack of coordination: Secondary | ICD-10-CM

## 2022-04-22 DIAGNOSIS — G20B1 Parkinson's disease with dyskinesia, without mention of fluctuations: Secondary | ICD-10-CM

## 2022-04-23 NOTE — Therapy (Signed)
OUTPATIENT OCCUPATIONAL THERAPY NEURO/LSVT BIG TREATMENT NOTE  Patient Name: Carlos Mathis MRN: [id number] DOB:1941/06/09, 81 y.o., male Today's Date: 04/23/2022  PCP: Dr. Frazier Richards REFERRING PROVIDER: Dr. Jennings Books   OT End of Session - 04/23/22 1907     Visit Number 9    Number of Visits 17    Date for OT Re-Evaluation 05/11/22    OT Start Time 0830    OT Stop Time 0930    OT Time Calculation (min) 60 min    Equipment Utilized During Treatment [facility]    Activity Tolerance Patient tolerated treatment well    Behavior During Therapy WFL for tasks assessed/performed             Past Medical History:  Diagnosis Date   Arthritis    BPH (benign prostatic hyperplasia)    CKD (chronic kidney disease), stage III (HCC)    Coronary artery disease    GERD (gastroesophageal reflux disease)    History of hiatal hernia    HLD (hyperlipidemia)    Hypertension    Long term current use of antithrombotics/antiplatelets    a.) DAPT therapy (ASA+ ticagrelor)   MCI (mild cognitive impairment) with memory loss    a.) takes apoaequorin   Migraines    OSA on CPAP    Pneumonia    Skin cancer, basal cell    STEMI (ST elevation myocardial infarction) ([place]) 07/27/2020   a.) MI while living in [place]; underwent PCI placing 4.0 x 18 mm and 3.5 x 38 mm Resolute Onyx DES (vessels unknown/unspecified).   T2DM (type 2 diabetes mellitus) ([place])    Past Surgical History:  Procedure Laterality Date   CARDIAC CATHETERIZATION     CATARACT EXTRACTION Right    HERNIA REPAIR Left    inguinal   INSERTION OF MESH  09/01/2021   Procedure: INSERTION OF MESH;  Surgeon: Herbert Pun, MD;  Location: [facility] ORS;  Service: General;;   JOINT REPLACEMENT     tka   UMBILICAL HERNIA REPAIR     There are no problems to display for this patient.   ONSET DATE: 01/31/22 (date of referral)  REFERRING DIAG: Parkinson's Disease  THERAPY DIAG:  Other lack of coordination  Parkinson's disease  with dyskinesia, unspecified whether manifestations fluctuate  Muscle weakness (generalized)  Rationale for Evaluation and Treatment Rehabilitation  SUBJECTIVE:   SUBJECTIVE STATEMENT: Pt reports that he scheduled his cataract sx on an "off" day from OT the last week of therapy, and MD office advised pt can go ahead with OT that week as long as he avoids lifting. Pt accompanied by: self  PERTINENT HISTORY: Per chart, Memory Loss: concerns for Lewy body dementia in a patient with episodic confusion, parkinsonism, dream enactment, hyposmia, visual hallucinations, behavioral changes. Memory loss starting 2018 with cognitive decline over 2022. Patient reports difficulty remembering conversations, difficulty recognizing old friends, getting lost while driving. Per spouse patient can become aggressive behind the wheel. Patient reports visual hallucinations (dark areas out of his periphery) and audio hallucinations. Wife assists with medications and finances. Has become more emotional over the last year and sometimes becomes angry with spouse which is unusual. Denies alcohol use or difficulty sleeping. Significant procrastiantion, low motivation.    PRECAUTIONS: Fall  WEIGHT BEARING RESTRICTIONS No  PAIN:  Are you having pain? Yes: NPRS scale: 1-2/10 Pain location: low back Pain description: spasm, grabbing Aggravating factors: standing/walking Relieving factors: heat, rest, pain meds  FALLS: Has patient fallen in last 6 months? No, but several stumbles;  Pt reports he tends to lose his balance forward and struggles to walk down a ramp.  Pt states he's been caught by another person to prevent a fall while walking down a ramp.  LIVING ENVIRONMENT: Lives with: lives with their spouse Lives in: Other house, 2 levels (upstairs is bonus room but pt rarely goes up there) Stairs: Yes: External: 4 steps; bilateral but cannot reach both Has following equipment at home: Single point cane, uses in  community settings in a crowd  PLOF:  retired from [employer], [employer]  PATIENT GOALS "To get to where I can stand up without falling down.  Get better balance."   OBJECTIVE:  ABC Scale: 60.6% self confident in doing activity without losing balance Berg: 43/56 (low fall risk), but significant challenge with turning to look behind, 360 turn, standing with 1 foot in front, and single leg stance. Tug: 10 sec without AD (normal) 360 Turn test: 5 sec to the R (increased fall risk); Pt also attempted to turn to the L requiring close SBA d/t very wide BOS and small balance check   04/11/22: 6 MWT: 720 ft (min guard with gait belt, followed by transport chair) BP and HR WNL before/after.    HAND DOMINANCE: Right   MOBILITY STATUS: difficulty carrying objects with ambulation; when more fatigued, pt shuffles and has anterior lean  POSTURE COMMENTS:  Anterior lean with ambulation , erect sitting and standing Sitting balance: Moves/returns truncal midpoint >2 inches in all planes  ACTIVITY TOLERANCE: Activity tolerance: increased shuffling when fatigued  FUNCTIONAL OUTCOME MEASURES: FOTO: To be assessed next session  UPPER EXTREMITY ROM    BUE ROM WNL   UPPER EXTREMITY MMT:    BUEs grossly 5/5  HAND FUNCTION: Grip strength: Right: 50 lbs; Left: 59 lbs, Lateral pinch: Right: 17 lbs, Left: 17 lbs, and 3 point pinch: Right: 13 lbs, Left: 12 lbs  COORDINATION: Finger Nose Finger test: WFL bilat 9 Hole Peg test: Right: 26 sec; Left: 35 sec   TODAY'S TREATMENT: Neuro re-ed:  LSVT: Patient seen for LSVT Daily Session Maximal Daily Exercises (in the adapted/standing position today with chair for UE support) for facilitation/coordination of movement. Maximum Sustained Movements are designed to rescale the amplitude of movement output for generalization to daily functional activities. Performed as follows for 1 set of 10 repetitions each multi-directional sustained movements. 1) Floor  to ceiling (improving reach to floor) 2) Side to side multidirectional (tactile cues for outstretched back leg 15% of the time), min vc today to step foot back to starting position rather than slide  Repetitive movements performed in standing (performed in the adapted/standing position today with chair for UE support) and are designed to provide retraining effort needed for sustained muscle activation in tasks. Performed as follows for 1 set of 10 repetitions each of multi-directional repetitive movements: 3) Step and reach forward step- initial min vc and visual cue to pick up L foot when stepping back to neutral 4) Step and reach sideways step- initial min vc to pick up L foot when stepping back to neutral  5) Step and reach backwards step- initial min vc to pick up L foot when stepping back to neutral 6) Rock and reach forward/backward- initial min tactile cues for big arm swing backward with return to erect posture during forward reach 7) Rock and reach sideways- mod vc, min tactile cues for big arms, palms up, and to look over the shoulder; performed today without support of chair.  Wall stretch completed  for erect posture: 3 reps of 1 min hold, min tactile and vc for slight chin tuck and posterior shift of head to wall, shoulders back.   Facilitated Big walking with step over 4 cones x 5 reps back and forth, max repeated cues to prevent circumducted swing of foot around cone.  2 cones knocked over today.  Functional Component Task: 1.Sit to stand with supervision 20 reps, initial min vc for big reach upon sitting and full erect standing with each rep.   2. Buttoning/unbuttoning small buttons on a shirt: x3 reps with 6 buttons.  Pt with good recall to perform finger flicks prior to [facility] activity without prompting.  Hierarchy Tasks 3. Carrying a full laundry basket; practiced carrying small rectangular basket 2 hands, view of feet obstructed.  Pt required mod vc to avoid shuffling feet. 4.  Walking up/down a ramp: not completed today (focus on Big walking) 5. Transfer into car: Not completed today  PATIENT EDUCATION: Education details: Maximal daily exercises in the adapted/standing position,car transfer technique. Person educated: pt Education method: explanation, demo, written handout Education comprehension: verbalized and demonstrated understanding with verbal and tactile cues   HOME EXERCISE PROGRAM: Pt given daily carryover task of maximal daily exercises in the adapted position (in standing with chair for UE support), Big walking, and seated marches.  GOALS: Goals reviewed with patient? Yes  SHORT TERM GOALS: Target date: 03/30/22  Pt will perform maximal daily exercise with min vc and handout. Baseline:  Will initiate next visit Goal status: INITIAL  2.  Pt will turn in place (360*) <4 sec with good stability. Baseline: 5 sec, requires close supv d/t balance checks; performs with wide BOS. Goal status: INITIAL  3.  Pt will don a tight sock (compression sock) with modified indep Baseline: Pt reports difficulty donning "tight" socks Goal status: INITIAL  LONG TERM GOALS: Target date: 05/11/22  Pt will increase FOTO score by 5 or more points to indicate increase in perceived functional performance with daily tasks.  Baseline: Will complete FOTO next session Goal status: INITIAL  2.  Pt will safely carry a loaded box or basket with good stability and reduced FOF. Baseline: Requires close supv-min A  Goal status: INITIAL  3.  Pt will increase level of confidence to 70% (confidence that pt will not lose balance) when walking in a crowd where people rapidly walk by.   Baseline: 50% confident Goal status: INITIAL  4.  Pt will confidently walk up/down a ramp with use of rail with modified indep. Baseline: Requires min A to amb down a ramp, close supv to amb up ramp Goal status: INITIAL  5.  Pt will increase dexterity in bilat hands to manage clothing  fasteners more efficiently. Baseline: difficulty with fastening small buttons  Goal status: INITIAL  6.  Pt will increase level of confidence to 70% (confidence that pt will not lose balance) when transferring in/out of a car. Baseline: 50% confident Goal status: INITIAL  ASSESSMENT:  CLINICAL IMPRESSION: Pt steadily increasing STS reps today; completed 20.  Avoided seated marches today to limit back discomfort, but pt was able to tolerate standing marches to tap cones without pain.  Pt required less cueing to initiate each maximal daily exercise today and was able to add the cognitive component of keeping his 10 count with intermittent min vc.  Less cues needed for big step back to starting position with each exercise to avoid sliding feet.  Ongoing cues for foot clearance to limit shuffling  with ambulation, and shuffling increases with conversation or other multi-tasking, ie, carrying basket.  Pt will continue to benefit from skilled OT for progression of maximal daily exercises, hierarchy tasks, and functional component tasks for an overall goal of calibrating movements for pt to automatically use bigger/larger amplitude movements in everyday living, as well as to improve strength and balance and decrease risk of falling.  PERFORMANCE DEFICITS in functional skills including ADLs, IADLs, coordination, dexterity, proprioception, sensation, pain, FMC, GMC, mobility, balance, body mechanics, endurance, decreased knowledge of precautions, decreased knowledge of use of DME, and vision, cognitive skills including attention, memory, safety awareness, and thought. IMPAIRMENTS are limiting patient from ADLs, IADLs, rest and sleep, leisure, and social participation.   COMORBIDITIES may have co-morbidities  that affects occupational performance. Patient will benefit from skilled OT to address above impairments and improve overall function.  MODIFICATION OR ASSISTANCE TO COMPLETE EVALUATION: Min-Moderate  modification of tasks or assist with assess necessary to complete an evaluation.  OT OCCUPATIONAL PROFILE AND HISTORY: Problem focused assessment: Including review of records relating to presenting problem.  CLINICAL DECISION MAKING: Moderate - several treatment options, min-mod task modification necessary  REHAB POTENTIAL: Good  EVALUATION COMPLEXITY: Moderate    PLAN: OT FREQUENCY: 2x/week  OT DURATION: 12 weeks  PLANNED INTERVENTIONS: self care/ADL training, therapeutic exercise, therapeutic activity, neuromuscular re-education, gait training, balance training, functional mobility training, patient/family education, cognitive remediation/compensation, energy conservation, and DME and/or AE instructions  RECOMMENDED OTHER SERVICES: N/A  CONSULTED AND AGREED WITH PLAN OF CARE: Patient and family member/caregiver  PLAN FOR NEXT SESSION: Initiate maximal daily exercises, initiate functional component tasks, and hierarchy tasks   Leta Speller, MS, OTR/L  Darleene Cleaver, OT 04/23/2022, 7:08 PM

## 2022-04-26 ENCOUNTER — Encounter: Payer: Self-pay | Admitting: Ophthalmology

## 2022-04-26 ENCOUNTER — Ambulatory Visit: Payer: Medicare Other

## 2022-04-26 DIAGNOSIS — G20B1 Parkinson's disease with dyskinesia, without mention of fluctuations: Secondary | ICD-10-CM

## 2022-04-26 DIAGNOSIS — R2681 Unsteadiness on feet: Secondary | ICD-10-CM

## 2022-04-26 DIAGNOSIS — R278 Other lack of coordination: Secondary | ICD-10-CM

## 2022-04-26 NOTE — Therapy (Unsigned)
OUTPATIENT OCCUPATIONAL THERAPY NEURO/LSVT BIG TREATMENT NOTE  Patient Name: Carlos Mathis MRN: [id number] DOB:09/22/1940, 81 y.o., male Today's Date: 04/23/2022  PCP: Dr. Frazier Richards REFERRING PROVIDER: Dr. Jennings Books   OT End of Session - 04/23/22 1907     Visit Number 9    Number of Visits 17    Date for OT Re-Evaluation 05/11/22    OT Start Time 0830    OT Stop Time 0930    OT Time Calculation (min) 60 min    Equipment Utilized During Treatment [facility]    Activity Tolerance Patient tolerated treatment well    Behavior During Therapy WFL for tasks assessed/performed             Past Medical History:  Diagnosis Date   Arthritis    BPH (benign prostatic hyperplasia)    CKD (chronic kidney disease), stage III (HCC)    Coronary artery disease    GERD (gastroesophageal reflux disease)    History of hiatal hernia    HLD (hyperlipidemia)    Hypertension    Long term current use of antithrombotics/antiplatelets    a.) DAPT therapy (ASA+ ticagrelor)   MCI (mild cognitive impairment) with memory loss    a.) takes apoaequorin   Migraines    OSA on CPAP    Pneumonia    Skin cancer, basal cell    STEMI (ST elevation myocardial infarction) ([place]) 07/27/2020   a.) MI while living in [place]; underwent PCI placing 4.0 x 18 mm and 3.5 x 38 mm Resolute Onyx DES (vessels unknown/unspecified).   T2DM (type 2 diabetes mellitus) ([place])    Past Surgical History:  Procedure Laterality Date   CARDIAC CATHETERIZATION     CATARACT EXTRACTION Right    HERNIA REPAIR Left    inguinal   INSERTION OF MESH  09/01/2021   Procedure: INSERTION OF MESH;  Surgeon: Herbert Pun, MD;  Location: [facility] ORS;  Service: General;;   JOINT REPLACEMENT     tka   UMBILICAL HERNIA REPAIR     There are no problems to display for this patient.   ONSET DATE: 01/31/22 (date of referral)  REFERRING DIAG: Parkinson's Disease  THERAPY DIAG:  Other lack of coordination  Parkinson's disease  with dyskinesia, unspecified whether manifestations fluctuate  Muscle weakness (generalized)  Rationale for Evaluation and Treatment Rehabilitation  SUBJECTIVE:   SUBJECTIVE STATEMENT: Pt reports that he scheduled his cataract sx on an "off" day from OT the last week of therapy, and MD office advised pt can go ahead with OT that week as long as he avoids lifting. Pt accompanied by: self  PERTINENT HISTORY: Per chart, Memory Loss: concerns for Lewy body dementia in a patient with episodic confusion, parkinsonism, dream enactment, hyposmia, visual hallucinations, behavioral changes. Memory loss starting 2018 with cognitive decline over 2022. Patient reports difficulty remembering conversations, difficulty recognizing old friends, getting lost while driving. Per spouse patient can become aggressive behind the wheel. Patient reports visual hallucinations (dark areas out of his periphery) and audio hallucinations. Wife assists with medications and finances. Has become more emotional over the last year and sometimes becomes angry with spouse which is unusual. Denies alcohol use or difficulty sleeping. Significant procrastiantion, low motivation.    PRECAUTIONS: Fall  WEIGHT BEARING RESTRICTIONS No  PAIN:  Are you having pain? Yes: NPRS scale: 1-2/10 Pain location: low back Pain description: spasm, grabbing Aggravating factors: standing/walking Relieving factors: heat, rest, pain meds  FALLS: Has patient fallen in last 6 months? No, but several stumbles;  Pt reports he tends to lose his balance forward and struggles to walk down a ramp.  Pt states he's been caught by another person to prevent a fall while walking down a ramp.  LIVING ENVIRONMENT: Lives with: lives with their spouse Lives in: Other house, 2 levels (upstairs is bonus room but pt rarely goes up there) Stairs: Yes: External: 4 steps; bilateral but cannot reach both Has following equipment at home: Single point cane, uses in  community settings in a crowd  PLOF:  retired from [employer], [employer]  PATIENT GOALS "To get to where I can stand up without falling down.  Get better balance."   OBJECTIVE:  ABC Scale: 60.6% self confident in doing activity without losing balance Berg: 43/56 (low fall risk), but significant challenge with turning to look behind, 360 turn, standing with 1 foot in front, and single leg stance. Tug: 10 sec without AD (normal) 360 Turn test: 5 sec to the R (increased fall risk); Pt also attempted to turn to the L requiring close SBA d/t very wide BOS and small balance check   04/11/22: 6 MWT: 720 ft (min guard with gait belt, followed by transport chair) BP and HR WNL before/after.    HAND DOMINANCE: Right   MOBILITY STATUS: difficulty carrying objects with ambulation; when more fatigued, pt shuffles and has anterior lean  POSTURE COMMENTS:  Anterior lean with ambulation , erect sitting and standing Sitting balance: Moves/returns truncal midpoint >2 inches in all planes  ACTIVITY TOLERANCE: Activity tolerance: increased shuffling when fatigued  FUNCTIONAL OUTCOME MEASURES: FOTO: To be assessed next session 63.5 11/7  UPPER EXTREMITY ROM    BUE ROM WNL   UPPER EXTREMITY MMT:    BUEs grossly 5/5  HAND FUNCTION: Grip strength: Right: 50 lbs; Left: 59 lbs, Lateral pinch: Right: 17 lbs, Left: 17 lbs, and 3 point pinch: Right: 13 lbs, Left: 12 lbs  COORDINATION: Finger Nose Finger test: [facility] bilat 9 Hole Peg test: Right: 26 sec; Left: 35 sec   TODAY'S TREATMENT: Neuro re-ed:  LSVT: Patient seen for LSVT Daily Session Maximal Daily Exercises (in the adapted/standing position today with chair for UE support) for facilitation/coordination of movement. Maximum Sustained Movements are designed to rescale the amplitude of movement output for generalization to daily functional activities. Performed as follows for 1 set of 10 repetitions each multi-directional sustained  movements. 1) Floor to ceiling (improving reach to floor) 2) Side to side multidirectional (tactile cues for outstretched back leg 15% of the time), min vc today to step foot back to starting position rather than slide  Repetitive movements performed in standing (performed in the adapted/standing position today with chair for UE support) and are designed to provide retraining effort needed for sustained muscle activation in tasks. Performed as follows for 1 set of 10 repetitions each of multi-directional repetitive movements: 3) Step and reach forward step- initial min vc and visual cue to pick up L foot when stepping back to neutral 4) Step and reach sideways step- initial min vc to pick up L foot when stepping back to neutral  5) Step and reach backwards step- initial min vc to pick up L foot when stepping back to neutral 6) Rock and reach forward/backward- initial min tactile cues for big arm swing backward with return to erect posture during forward reach 7) Rock and reach sideways- mod vc, min tactile cues for big arms, palms up, and to look over the shoulder; performed today without support of chair.  Wall  stretch completed for erect posture: 3 reps of 1 min hold, min tactile and vc for slight chin tuck and posterior shift of head to wall, shoulders back.   Facilitated Big walking with step over 4 cones x 5 reps back and forth, max repeated cues to prevent circumducted swing of foot around cone.  2 cones knocked over today.  Functional Component Task: 1.Sit to stand with supervision 20 reps, initial min vc for big reach upon sitting and full erect standing with each rep.   2. Buttoning/unbuttoning small buttons on a shirt: x3 reps with 6 buttons.  Pt with good recall to perform finger flicks prior to [facility] activity without prompting.  Hierarchy Tasks 3. Carrying a full laundry basket; practiced carrying small rectangular basket 2 hands, view of feet obstructed.  Pt required mod vc to avoid  shuffling feet. 4. Walking up/down a ramp: not completed today (focus on Big walking) 5. Transfer into car: Not completed today  PATIENT EDUCATION: Education details: Maximal daily exercises in the adapted/standing position,car transfer technique. Person educated: pt Education method: explanation, demo, written handout Education comprehension: verbalized and demonstrated understanding with verbal and tactile cues   HOME EXERCISE PROGRAM: Pt given daily carryover task of maximal daily exercises in the adapted position (in standing with chair for UE support), Big walking, and seated marches.  GOALS: Goals reviewed with patient? Yes  SHORT TERM GOALS: Target date: 03/30/22  Pt will perform maximal daily exercise with min vc and handout. Baseline:  Will initiate next visit Goal status: INITIAL  2.  Pt will turn in place (360*) <4 sec with good stability. Baseline: 5 sec, requires close supv d/t balance checks; performs with wide BOS. Goal status: INITIAL  3.  Pt will don a tight sock (compression sock) with modified indep Baseline: Pt reports difficulty donning "tight" socks Goal status: INITIAL  LONG TERM GOALS: Target date: 05/11/22  Pt will increase FOTO score by 5 or more points to indicate increase in perceived functional performance with daily tasks.  Baseline: Will complete FOTO next session Goal status: INITIAL  2.  Pt will safely carry a loaded box or basket with good stability and reduced FOF. Baseline: Requires close supv-min A  Goal status: INITIAL  3.  Pt will increase level of confidence to 70% (confidence that pt will not lose balance) when walking in a crowd where people rapidly walk by.   Baseline: 50% confident Goal status: INITIAL  4.  Pt will confidently walk up/down a ramp with use of rail with modified indep. Baseline: Requires min A to amb down a ramp, close supv to amb up ramp Goal status: INITIAL  5.  Pt will increase dexterity in bilat hands to  manage clothing fasteners more efficiently. Baseline: difficulty with fastening small buttons Goal status: INITIAL  6.  Pt will increase level of confidence to 70% (confidence that pt will not lose balance) when transferring in/out of a car. Baseline: 50% confident, 80% Goal status: INITIAL  ASSESSMENT:  CLINICAL IMPRESSION: Pt steadily increasing STS reps today; completed 20.  Avoided seated marches today to limit back discomfort, but pt was able to tolerate standing marches to tap cones without pain.  Pt required less cueing to initiate each maximal daily exercise today and was able to add the cognitive component of keeping his 10 count with intermittent min vc.  Less cues needed for big step back to starting position with each exercise to avoid sliding feet.  Ongoing cues for foot clearance to  limit shuffling with ambulation, and shuffling increases with conversation or other multi-tasking, ie, carrying basket.  Pt will continue to benefit from skilled OT for progression of maximal daily exercises, hierarchy tasks, and functional component tasks for an overall goal of calibrating movements for pt to automatically use bigger/larger amplitude movements in everyday living, as well as to improve strength and balance and decrease risk of falling.  PERFORMANCE DEFICITS in functional skills including ADLs, IADLs, coordination, dexterity, proprioception, sensation, pain, FMC, GMC, mobility, balance, body mechanics, endurance, decreased knowledge of precautions, decreased knowledge of use of DME, and vision, cognitive skills including attention, memory, safety awareness, and thought. IMPAIRMENTS are limiting patient from ADLs, IADLs, rest and sleep, leisure, and social participation.   COMORBIDITIES may have co-morbidities  that affects occupational performance. Patient will benefit from skilled OT to address above impairments and improve overall function.  MODIFICATION OR ASSISTANCE TO COMPLETE  EVALUATION: Min-Moderate modification of tasks or assist with assess necessary to complete an evaluation.  OT OCCUPATIONAL PROFILE AND HISTORY: Problem focused assessment: Including review of records relating to presenting problem.  CLINICAL DECISION MAKING: Moderate - several treatment options, min-mod task modification necessary  REHAB POTENTIAL: Good  EVALUATION COMPLEXITY: Moderate    PLAN: OT FREQUENCY: 2x/week  OT DURATION: 12 weeks  PLANNED INTERVENTIONS: self care/ADL training, therapeutic exercise, therapeutic activity, neuromuscular re-education, gait training, balance training, functional mobility training, patient/family education, cognitive remediation/compensation, energy conservation, and DME and/or AE instructions  RECOMMENDED OTHER SERVICES: N/A  CONSULTED AND AGREED WITH PLAN OF CARE: Patient and family member/caregiver  PLAN FOR NEXT SESSION: Initiate maximal daily exercises, initiate functional component tasks, and hierarchy tasks   Leta Speller, MS, OTR/L  Darleene Cleaver, OT 04/23/2022, 7:08 PM

## 2022-04-27 ENCOUNTER — Ambulatory Visit: Payer: Medicare Other

## 2022-04-27 DIAGNOSIS — G20B1 Parkinson's disease with dyskinesia, without mention of fluctuations: Secondary | ICD-10-CM

## 2022-04-27 DIAGNOSIS — R278 Other lack of coordination: Secondary | ICD-10-CM

## 2022-04-27 DIAGNOSIS — R2681 Unsteadiness on feet: Secondary | ICD-10-CM

## 2022-04-28 ENCOUNTER — Ambulatory Visit: Payer: Medicare Other

## 2022-04-28 DIAGNOSIS — G20B1 Parkinson's disease with dyskinesia, without mention of fluctuations: Secondary | ICD-10-CM

## 2022-04-28 DIAGNOSIS — R2681 Unsteadiness on feet: Secondary | ICD-10-CM

## 2022-04-28 DIAGNOSIS — R278 Other lack of coordination: Secondary | ICD-10-CM

## 2022-04-28 NOTE — Discharge Instructions (Signed)

## 2022-04-29 ENCOUNTER — Ambulatory Visit: Payer: Medicare Other

## 2022-04-29 DIAGNOSIS — G20B1 Parkinson's disease with dyskinesia, without mention of fluctuations: Secondary | ICD-10-CM

## 2022-04-29 DIAGNOSIS — R2681 Unsteadiness on feet: Secondary | ICD-10-CM

## 2022-04-29 DIAGNOSIS — R278 Other lack of coordination: Secondary | ICD-10-CM

## 2022-04-29 NOTE — Therapy (Signed)
OUTPATIENT OCCUPATIONAL THERAPY NEURO/LSVT BIG TREATMENT NOTE  Patient Name: Carlos Mathis MRN: [id number] DOB:1941-05-21, 81 y.o., male Today's Date: 04/29/2022  PCP: Dr. Frazier Richards REFERRING PROVIDER: Dr. Jennings Books   OT End of Session - 04/29/22 0809     Visit Number 11    Number of Visits 17    Date for OT Re-Evaluation 05/11/22    OT Start Time 0830    OT Stop Time 0930    OT Time Calculation (min) 60 min    Activity Tolerance Patient tolerated treatment well    Behavior During Therapy WFL for tasks assessed/performed              Past Medical History:  Diagnosis Date   Arthritis    BPH (benign prostatic hyperplasia)    CKD (chronic kidney disease), stage III (HCC)    Coronary artery disease    GERD (gastroesophageal reflux disease)    History of hiatal hernia    HLD (hyperlipidemia)    Hypertension    Long term current use of antithrombotics/antiplatelets    a.) DAPT therapy (ASA+ ticagrelor)   MCI (mild cognitive impairment) with memory loss    a.) takes apoaequorin   Migraines    OSA on CPAP    Pneumonia    Skin cancer, basal cell    STEMI (ST elevation myocardial infarction) ([place]) 07/27/2020   a.) MI while living in [place]; underwent PCI placing 4.0 x 18 mm and 3.5 x 38 mm Resolute Onyx DES (vessels unknown/unspecified).   T2DM (type 2 diabetes mellitus) ([place])    Vertigo    Wears dentures    Full upper, partial lower   Wears hearing aid in both ears    Past Surgical History:  Procedure Laterality Date   CARDIAC CATHETERIZATION     CATARACT EXTRACTION Right    HERNIA REPAIR Left    inguinal   INSERTION OF MESH  09/01/2021   Procedure: INSERTION OF MESH;  Surgeon: Herbert Pun, MD;  Location: [facility] ORS;  Service: General;;   JOINT REPLACEMENT     tka   UMBILICAL HERNIA REPAIR     There are no problems to display for this patient.   ONSET DATE: 01/31/22 (date of referral)  REFERRING DIAG: Parkinson's Disease  THERAPY DIAG:   Other lack of coordination  Parkinson's disease with dyskinesia, unspecified whether manifestations fluctuate  Unsteadiness on feet  Rationale for Evaluation and Treatment Rehabilitation  SUBJECTIVE:   SUBJECTIVE STATEMENT: Pt reports doing well this morning.   Pt accompanied by: self  PERTINENT HISTORY: Per chart, Memory Loss: concerns for Lewy body dementia in a patient with episodic confusion, parkinsonism, dream enactment, hyposmia, visual hallucinations, behavioral changes. Memory loss starting 2018 with cognitive decline over 2022. Patient reports difficulty remembering conversations, difficulty recognizing old friends, getting lost while driving. Per spouse patient can become aggressive behind the wheel. Patient reports visual hallucinations (dark areas out of his periphery) and audio hallucinations. Wife assists with medications and finances. Has become more emotional over the last year and sometimes becomes angry with spouse which is unusual. Denies alcohol use or difficulty sleeping. Significant procrastiantion, low motivation.    PRECAUTIONS: Fall  WEIGHT BEARING RESTRICTIONS No  PAIN:  Are you having pain? Yes: NPRS scale: 2/10 Pain location: low back Pain description: spasm, grabbing Aggravating factors: standing/walking Relieving factors: heat, rest, pain meds  FALLS: Has patient fallen in last 6 months? No, but several stumbles; Pt reports he tends to lose his balance forward and struggles to  walk down a ramp.  Pt states he's been caught by another person to prevent a fall while walking down a ramp.  LIVING ENVIRONMENT: Lives with: lives with their spouse Lives in: Other house, 2 levels (upstairs is bonus room but pt rarely goes up there) Stairs: Yes: External: 4 steps; bilateral but cannot reach both Has following equipment at home: Single point cane, uses in community settings in a crowd  PLOF:  retired from [employer], [employer]  PATIENT GOALS "To get to  where I can stand up without falling down.  Get better balance."   OBJECTIVE:  ABC Scale: 60.6% self confident in doing activity without losing balance; 04/26/22: 62.5% Berg: 43/56 (low fall risk), but significant challenge with turning to look behind, 360 turn, standing with 1 foot in front, and single leg stance Berg: 04/26/22: 52/56 Tug: 10 sec without AD (normal) 360 Turn test: 5 sec to the R (increased fall risk); Pt also attempted to turn to the L requiring close SBA d/t very wide BOS and small balance check   04/11/22: 6 MWT: 720 ft (min guard with gait belt, followed by transport chair) BP and HR WNL before/after.   04/26/22: 6 MWT: 1,060 ft supv-intermittent min guard without AD  HAND DOMINANCE: Right   MOBILITY STATUS: difficulty carrying objects with ambulation; when more fatigued, pt shuffles and has anterior lean  POSTURE COMMENTS:  Anterior lean with ambulation , erect sitting and standing Sitting balance: Moves/returns truncal midpoint >2 inches in all planes  ACTIVITY TOLERANCE: Activity tolerance: increased shuffling when fatigued  FUNCTIONAL OUTCOME MEASURES: FOTO: To be assessed next session 04/11/22: 50 04/26/22: 63.5   UPPER EXTREMITY ROM    BUE ROM WNL   UPPER EXTREMITY MMT:    BUEs grossly 5/5  HAND FUNCTION: Grip strength: Right: 50 lbs; Left: 59 lbs, Lateral pinch: Right: 17 lbs, Left: 17 lbs, and 3 point pinch: Right: 13 lbs, Left: 12 lbs  COORDINATION: Finger Nose Finger test: [facility] bilat 9 Hole Peg test: Right: 26 sec; Left: 35 sec   TODAY'S TREATMENT: Neuro re-ed:  LSVT: Patient seen for LSVT Daily Session Maximal Daily Exercises for facilitation/coordination of movement. Maximum Sustained Movements are designed to rescale the amplitude of movement output for generalization to daily functional activities. Performed as follows for 1 set of 10 repetitions each multi-directional sustained movements. 1) Floor to ceiling (improving reach to  floor) 2) Side to side multidirectional: min vc today to step foot back to starting position rather than slide  Repetitive movements performed in standing and are designed to provide retraining effort needed for sustained muscle activation in tasks. Performed as follows for 1 set of 10 repetitions each of multi-directional repetitive movements: 3) Step and reach forward step- repeated vc and visual cue for big stepping back to neutral 4) Step and reach sideways step-repeated vc and visual cue for big stepping back to neutral  5) Step and reach backwards step- initial min tactile cue for form and technique 6) Rock and reach forward/backward-max tactile and vc for form and technique  7) Rock and reach sideways- close supv, intermittent min guard with mod tactile and vc for form.  Wall stretch completed for erect posture: 3 reps of 1 min hold, min vc for form Seated marches x2 sets 15 reps, intermittent min vc for maximal knee lift each side Standing marches with cone taps x3 sets 10 reps each. Big walking: indoor/outdoor, constant cues for foot clearance.  Functional Component Task: 1.Sit to stand with supervision  20 reps, intermittent vc for full erect standing with each rep.   2. Buttoning/unbuttoning small buttons on a shirt: not completed today  Hierarchy Tasks 3. Carrying a full laundry basket: Not completed today 4. Walking up/down a ramp: outside paved ramps with mod vc for erect posture, higher foot clearance with each step, and min guard. 5. Transfer into car: Not completed today  PATIENT EDUCATION: Education details: Maximal daily exercises (no chair for support) Person educated: pt Education method: explanation, demo, written handout Education comprehension: verbalized and demonstrated understanding with verbal and tactile cues   HOME EXERCISE PROGRAM: Pt given daily carryover task of maximal daily exercises in the adapted position (in standing with chair for UE support),  Big walking, and seated marches.  GOALS: Goals reviewed with patient? Yes  SHORT TERM GOALS: Target date: 03/30/22  Pt will perform maximal daily exercise with min vc and handout. Baseline:  Will initiate next visit; 04/26/22: pt performs with min-mod vc (max vc for rock and reach) Goal status: ongoing  2.  Pt will turn in place (360*) <4 sec with good stability. Baseline: 5 sec, requires close supv d/t balance checks; performs with wide BOS; 04/26/22: turns each direction in 4 sec with close supv Goal status: ongoing  3.  Pt will don a tight sock (compression sock) with modified indep Baseline: Pt reports difficulty donning "tight" socks; 04/26/22: moderately difficult Goal status: ongoing  LONG TERM GOALS: Target date: 05/11/22  Pt will increase FOTO score by 5 or more points to indicate increase in perceived functional performance with daily tasks.  Baseline: Will complete FOTO next session; 04/13/22: 50; 04/26/22: 63 Goal status: ongoing  2.  Pt will safely carry a loaded box or basket with good stability and reduced FOF. Baseline: Requires close supv-min A; 04/26/22: close supv Goal status: ongoing  3.  Pt will increase level of confidence to 70% (confidence that pt will not lose balance) when walking in a crowd where people rapidly walk by.   Baseline: 50% confident; 04/26/22: 50% Goal status: ongoing  4.  Pt will confidently walk up/down a ramp with use of rail with modified indep. Baseline: Requires min A to amb down a ramp, close supv to amb up ramp; 04/26/22: min guard down, supv up  Goal status: ongoing  5.  Pt will increase dexterity in bilat hands to manage clothing fasteners more efficiently. Baseline: difficulty with fastening small buttons; 04/26/22: pt more consistently using finger flicks and demos good ability to manage small buttons, but reports still struggling with a bottom button on a polo shirt. Goal status: ongoing  6.  Pt will increase level of confidence to  70% (confidence that pt will not lose balance) when transferring in/out of a car. Baseline: 50% confident, 04/26/22: 80% Goal status: ongoing  ASSESSMENT:  CLINICAL IMPRESSION: Pt continues to tolerate standard maximal daily exercises well, requiring adapted version for rock and reach only.  Pt continues to require mod vc for erect walking posture, and constant cues for foot clearance.  Pt continues to be challenged with multitasking and distractibility, making erect standing, foot clearance, and big arm swing difficult to coordinate simultaneously for any distance longer than 20-30 ft.  Participated in outside paved ramp negotiation with mod vc for erect posture, higher foot clearance with each step, and min guard. Pt will continue to benefit from skilled OT for progression of maximal daily exercises, hierarchy tasks, and functional component tasks for an overall goal of calibrating movements for pt to automatically  use bigger/larger amplitude movements in everyday living, as well as to improve strength and balance and decrease risk of falling.  PERFORMANCE DEFICITS in functional skills including ADLs, IADLs, coordination, dexterity, proprioception, sensation, pain, FMC, GMC, mobility, balance, body mechanics, endurance, decreased knowledge of precautions, decreased knowledge of use of DME, and vision, cognitive skills including attention, memory, safety awareness, and thought. IMPAIRMENTS are limiting patient from ADLs, IADLs, rest and sleep, leisure, and social participation.   COMORBIDITIES may have co-morbidities  that affects occupational performance. Patient will benefit from skilled OT to address above impairments and improve overall function.  MODIFICATION OR ASSISTANCE TO COMPLETE EVALUATION: Min-Moderate modification of tasks or assist with assess necessary to complete an evaluation.  OT OCCUPATIONAL PROFILE AND HISTORY: Problem focused assessment: Including review of records relating to  presenting problem.  CLINICAL DECISION MAKING: Moderate - several treatment options, min-mod task modification necessary  REHAB POTENTIAL: Good  EVALUATION COMPLEXITY: Moderate    PLAN: OT FREQUENCY: 2x/week  OT DURATION: 12 weeks  PLANNED INTERVENTIONS: self care/ADL training, therapeutic exercise, therapeutic activity, neuromuscular re-education, gait training, balance training, functional mobility training, patient/family education, cognitive remediation/compensation, energy conservation, and DME and/or AE instructions  RECOMMENDED OTHER SERVICES: N/A  CONSULTED AND AGREED WITH PLAN OF CARE: Patient and family member/caregiver  PLAN FOR NEXT SESSION: Initiate maximal daily exercises, initiate functional component tasks, and hierarchy tasks   Leta Speller, MS, OTR/L  Darleene Cleaver, OT 04/29/2022, 8:12 AM

## 2022-04-30 NOTE — Therapy (Signed)
OUTPATIENT OCCUPATIONAL THERAPY NEURO/LSVT BIG TREATMENT NOTE  Patient Name: Carlos Mathis MRN: [id number] DOB:April 27, 1941, 81 y.o., male Today's Date: 04/30/2022  PCP: Dr. Frazier Richards REFERRING PROVIDER: Dr. Jennings Books   OT End of Session - 04/30/22 1857     Visit Number 13    Number of Visits 17    Date for OT Re-Evaluation 05/11/22    OT Start Time 0830    OT Stop Time 0930    OT Time Calculation (min) 60 min    Activity Tolerance Patient tolerated treatment well    Behavior During Therapy WFL for tasks assessed/performed              Past Medical History:  Diagnosis Date   Arthritis    BPH (benign prostatic hyperplasia)    CKD (chronic kidney disease), stage III (HCC)    Coronary artery disease    GERD (gastroesophageal reflux disease)    History of hiatal hernia    HLD (hyperlipidemia)    Hypertension    Long term current use of antithrombotics/antiplatelets    a.) DAPT therapy (ASA+ ticagrelor)   MCI (mild cognitive impairment) with memory loss    a.) takes apoaequorin   Migraines    OSA on CPAP    Pneumonia    Skin cancer, basal cell    STEMI (ST elevation myocardial infarction) ([place]) 07/27/2020   a.) MI while living in [place]; underwent PCI placing 4.0 x 18 mm and 3.5 x 38 mm Resolute Onyx DES (vessels unknown/unspecified).   T2DM (type 2 diabetes mellitus) ([place])    Vertigo    Wears dentures    Full upper, partial lower   Wears hearing aid in both ears    Past Surgical History:  Procedure Laterality Date   CARDIAC CATHETERIZATION     CATARACT EXTRACTION Right    HERNIA REPAIR Left    inguinal   INSERTION OF MESH  09/01/2021   Procedure: INSERTION OF MESH;  Surgeon: Herbert Pun, MD;  Location: [facility] ORS;  Service: General;;   JOINT REPLACEMENT     tka   UMBILICAL HERNIA REPAIR     There are no problems to display for this patient.   ONSET DATE: 01/31/22 (date of referral)  REFERRING DIAG: Parkinson's Disease  THERAPY DIAG:   Other lack of coordination  Parkinson's disease with dyskinesia, unspecified whether manifestations fluctuate  Unsteadiness on feet  Rationale for Evaluation and Treatment Rehabilitation  SUBJECTIVE:   SUBJECTIVE STATEMENT: Pt reports that he's looking forward to driving his old car in a [employer] parade this weekend if the weather holds up.   Pt accompanied by: self  PERTINENT HISTORY: Per chart, Memory Loss: concerns for Lewy body dementia in a patient with episodic confusion, parkinsonism, dream enactment, hyposmia, visual hallucinations, behavioral changes. Memory loss starting 2018 with cognitive decline over 2022. Patient reports difficulty remembering conversations, difficulty recognizing old friends, getting lost while driving. Per spouse patient can become aggressive behind the wheel. Patient reports visual hallucinations (dark areas out of his periphery) and audio hallucinations. Wife assists with medications and finances. Has become more emotional over the last year and sometimes becomes angry with spouse which is unusual. Denies alcohol use or difficulty sleeping. Significant procrastiantion, low motivation.    PRECAUTIONS: Fall  WEIGHT BEARING RESTRICTIONS No  PAIN:  Are you having pain? Yes: NPRS scale: 2/10 Pain location: low back Pain description: spasm, grabbing Aggravating factors: standing/walking Relieving factors: heat, rest, pain meds  FALLS: Has patient fallen in last 6  months? No, but several stumbles; Pt reports he tends to lose his balance forward and struggles to walk down a ramp.  Pt states he's been caught by another person to prevent a fall while walking down a ramp.  LIVING ENVIRONMENT: Lives with: lives with their spouse Lives in: Other house, 2 levels (upstairs is bonus room but pt rarely goes up there) Stairs: Yes: External: 4 steps; bilateral but cannot reach both Has following equipment at home: Single point cane, uses in community settings in  a crowd  PLOF:  retired from [employer], [employer]  PATIENT GOALS "To get to where I can stand up without falling down.  Get better balance."   OBJECTIVE:  ABC Scale: 60.6% self confident in doing activity without losing balance; 04/26/22: 62.5% Berg: 43/56 (low fall risk), but significant challenge with turning to look behind, 360 turn, standing with 1 foot in front, and single leg stance Berg: 04/26/22: 52/56 Tug: 10 sec without AD (normal) 360 Turn test: 5 sec to the R (increased fall risk); Pt also attempted to turn to the L requiring close SBA d/t very wide BOS and small balance check   04/11/22: 6 MWT: 720 ft (min guard with gait belt, followed by transport chair) BP and HR WNL before/after.   04/26/22: 6 MWT: 1,060 ft supv-intermittent min guard without AD  HAND DOMINANCE: Right   MOBILITY STATUS: difficulty carrying objects with ambulation; when more fatigued, pt shuffles and has anterior lean  POSTURE COMMENTS:  Anterior lean with ambulation , erect sitting and standing Sitting balance: Moves/returns truncal midpoint >2 inches in all planes  ACTIVITY TOLERANCE: Activity tolerance: increased shuffling when fatigued  FUNCTIONAL OUTCOME MEASURES: FOTO: To be assessed next session 04/11/22: 50 04/26/22: 63.5   UPPER EXTREMITY ROM    BUE ROM WNL   UPPER EXTREMITY MMT:    BUEs grossly 5/5  HAND FUNCTION: Grip strength: Right: 50 lbs; Left: 59 lbs, Lateral pinch: Right: 17 lbs, Left: 17 lbs, and 3 point pinch: Right: 13 lbs, Left: 12 lbs  COORDINATION: Finger Nose Finger test: [facility] bilat 9 Hole Peg test: Right: 26 sec; Left: 35 sec   TODAY'S TREATMENT: Neuro re-ed:  LSVT: Patient seen for LSVT Daily Session Maximal Daily Exercises for facilitation/coordination of movement. Maximum Sustained Movements are designed to rescale the amplitude of movement output for generalization to daily functional activities. Performed as follows for 1 set of 10 repetitions each  multi-directional sustained movements. 1) Floor to ceiling  2) Side to side multidirectional: min tactile cues today to step foot back to starting position rather than slide  Repetitive movements performed in standing and are designed to provide retraining effort needed for sustained muscle activation in tasks. Performed as follows for 1 set of 10 repetitions each of multi-directional repetitive movements: 3) Step and reach forward step- mod vc and visual cue for big stepping back to neutral 4) Step and reach sideways step- mod vc and visual cue for big stepping back to neutral  5) Step and reach backwards step- mod vc for form and technique 6) Rock and reach forward/backward- initial min visual cue for form and technique (adapted positioning) 7) Rock and reach sideways- close supv, intermittent min guard with mod tactile and vc for form.  Wall stretch completed for erect posture: 3 reps of 1 min hold, min vc for form Big arm swings with hands touching wall behind him with each back swing for tactile/proprioceptive cue x3 sets 10 reps each Seated marches x2 sets 15  reps, intermittent min vc for maximal knee lift each side Standing marches with cone taps x3 sets 10 reps each. Big walking: indoor with constant cues for foot clearance.  Completed high reps in 10 ft distance, initiating quick "stops" to reset when pt dragged feet.  Practiced turning around cones with each 10 ft distance with mod vc to increase foot clearance during turns. Practiced 360 turns within 4 cones with cues for marching steps to turn R/L x5 reps each direction, close supv.   Functional Component Task: 1.Sit to stand with supervision 10 reps, initial min visual and vc for full erect standing and big arms with each rep.   2. Buttoning/unbuttoning small buttons on a shirt: not completed today  Hierarchy Tasks 3. Carrying a full laundry basket: Not completed today 4. Walking up/down a ramp: indoor with mod vc for erect  posture, higher foot clearance with each step, and min guard.  Initiated frequent quick stops to reset with foot dragging. 5. Transfer into car: Not completed today  PATIENT EDUCATION: Education details: Maximal daily exercises (chair for support with rock and reach sideways) Person educated: pt Education method: explanation, demo, written handout Education comprehension: verbalized and demonstrated understanding with verbal and tactile cues   HOME EXERCISE PROGRAM: Pt given daily carryover task of maximal daily exercises in the adapted position (in standing with chair for UE support), Big walking, and seated marches.  GOALS: Goals reviewed with patient? Yes  SHORT TERM GOALS: Target date: 03/30/22  Pt will perform maximal daily exercise with min vc and handout. Baseline:  Will initiate next visit; 04/26/22: pt performs with min-mod vc (max vc for rock and reach) Goal status: ongoing  2.  Pt will turn in place (360*) <4 sec with good stability. Baseline: 5 sec, requires close supv d/t balance checks; performs with wide BOS; 04/26/22: turns each direction in 4 sec with close supv Goal status: ongoing  3.  Pt will don a tight sock (compression sock) with modified indep Baseline: Pt reports difficulty donning "tight" socks; 04/26/22: moderately difficult Goal status: ongoing  LONG TERM GOALS: Target date: 05/11/22  Pt will increase FOTO score by 5 or more points to indicate increase in perceived functional performance with daily tasks.  Baseline: Will complete FOTO next session; 04/13/22: 50; 04/26/22: 63 Goal status: ongoing  2.  Pt will safely carry a loaded box or basket with good stability and reduced FOF. Baseline: Requires close supv-min A; 04/26/22: close supv Goal status: ongoing  3.  Pt will increase level of confidence to 70% (confidence that pt will not lose balance) when walking in a crowd where people rapidly walk by.   Baseline: 50% confident; 04/26/22: 50% Goal status:  ongoing  4.  Pt will confidently walk up/down a ramp with use of rail with modified indep. Baseline: Requires min A to amb down a ramp, close supv to amb up ramp; 04/26/22: min guard down, supv up  Goal status: ongoing  5.  Pt will increase dexterity in bilat hands to manage clothing fasteners more efficiently. Baseline: difficulty with fastening small buttons; 04/26/22: pt more consistently using finger flicks and demos good ability to manage small buttons, but reports still struggling with a bottom button on a polo shirt. Goal status: ongoing  6.  Pt will increase level of confidence to 70% (confidence that pt will not lose balance) when transferring in/out of a car. Baseline: 50% confident, 04/26/22: 80% Goal status: ongoing  ASSESSMENT: Increased rest periods provided during maximal daily  exercises this date as pt reported L chest "twinge."  Pt stated that he had this yesterday, but not constantly, and he wondered if it was indigestion.  OT took VS at that time.  HR 88, 02 97% on room air, and BP 119/73, with pt reporting pain of 1/10 in L chest.  After several min of rest, pt was able to continue exercises without any c/o pain.  Constant monitoring of symptoms throughout session.  VS taken end of session and all continued to present as normal range for activity with HR 98, 02 96%, and BP 124/70.  Pt reported no pain by end of session.  OT encouraged pt to monitor symptoms and notify MD as needed over the weekend, and pt verbalized that he'd use his nitro on his key chain as needed.  Pt continues to require mod vc to be consistent with big stepping rather than sliding foot back to neutral during maximal daily exercises.  OT continues to initiate quick "stops" during big walking, turning corners, or turning in place if pt shuffles feet, effective to help pt to reset and lift feet.  Pt will continue to benefit from skilled OT for progression of maximal daily exercises, hierarchy tasks, and functional  component tasks for an overall goal of calibrating movements for pt to automatically use bigger/larger amplitude movements in everyday living, as well as to improve strength and balance and decrease risk of falling.  PERFORMANCE DEFICITS in functional skills including ADLs, IADLs, coordination, dexterity, proprioception, sensation, pain, FMC, GMC, mobility, balance, body mechanics, endurance, decreased knowledge of precautions, decreased knowledge of use of DME, and vision, cognitive skills including attention, memory, safety awareness, and thought. IMPAIRMENTS are limiting patient from ADLs, IADLs, rest and sleep, leisure, and social participation.   COMORBIDITIES may have co-morbidities  that affects occupational performance. Patient will benefit from skilled OT to address above impairments and improve overall function.  MODIFICATION OR ASSISTANCE TO COMPLETE EVALUATION: Min-Moderate modification of tasks or assist with assess necessary to complete an evaluation.  OT OCCUPATIONAL PROFILE AND HISTORY: Problem focused assessment: Including review of records relating to presenting problem.  CLINICAL DECISION MAKING: Moderate - several treatment options, min-mod task modification necessary  REHAB POTENTIAL: Good  EVALUATION COMPLEXITY: Moderate    PLAN: OT FREQUENCY: 2x/week  OT DURATION: 12 weeks  PLANNED INTERVENTIONS: self care/ADL training, therapeutic exercise, therapeutic activity, neuromuscular re-education, gait training, balance training, functional mobility training, patient/family education, cognitive remediation/compensation, energy conservation, and DME and/or AE instructions  RECOMMENDED OTHER SERVICES: N/A  CONSULTED AND AGREED WITH PLAN OF CARE: Patient and family member/caregiver  PLAN FOR NEXT SESSION: Initiate maximal daily exercises, initiate functional component tasks, and hierarchy tasks   Leta Speller, MS, OTR/L  Darleene Cleaver, OT 04/30/2022, 6:58  PM

## 2022-04-30 NOTE — Therapy (Addendum)
OUTPATIENT OCCUPATIONAL THERAPY NEURO/LSVT BIG TREATMENT NOTE  Patient Name: Carlos Mathis MRN: [id number] DOB:Feb 11, 1941, 81 y.o., male Today's Date: 04/30/2022  PCP: Dr. Frazier Richards REFERRING PROVIDER: Dr. Jennings Books   OT End of Session - 04/30/22 1838     Visit Number 12    Number of Visits 17    Date for OT Re-Evaluation 05/11/22    OT Start Time 0830    OT Stop Time 0930    OT Time Calculation (min) 60 min    Activity Tolerance Patient tolerated treatment well    Behavior During Therapy WFL for tasks assessed/performed              Past Medical History:  Diagnosis Date   Arthritis    BPH (benign prostatic hyperplasia)    CKD (chronic kidney disease), stage III (HCC)    Coronary artery disease    GERD (gastroesophageal reflux disease)    History of hiatal hernia    HLD (hyperlipidemia)    Hypertension    Long term current use of antithrombotics/antiplatelets    a.) DAPT therapy (ASA+ ticagrelor)   MCI (mild cognitive impairment) with memory loss    a.) takes apoaequorin   Migraines    OSA on CPAP    Pneumonia    Skin cancer, basal cell    STEMI (ST elevation myocardial infarction) ([place]) 07/27/2020   a.) MI while living in [place]; underwent PCI placing 4.0 x 18 mm and 3.5 x 38 mm Resolute Onyx DES (vessels unknown/unspecified).   T2DM (type 2 diabetes mellitus) ([place])    Vertigo    Wears dentures    Full upper, partial lower   Wears hearing aid in both ears    Past Surgical History:  Procedure Laterality Date   CARDIAC CATHETERIZATION     CATARACT EXTRACTION Right    HERNIA REPAIR Left    inguinal   INSERTION OF MESH  09/01/2021   Procedure: INSERTION OF MESH;  Surgeon: Herbert Pun, MD;  Location: [facility] ORS;  Service: General;;   JOINT REPLACEMENT     tka   UMBILICAL HERNIA REPAIR     There are no problems to display for this patient.   ONSET DATE: 01/31/22 (date of referral)  REFERRING DIAG: Parkinson's Disease  THERAPY DIAG:   Other lack of coordination  Parkinson's disease with dyskinesia, unspecified whether manifestations fluctuate  Unsteadiness on feet  Rationale for Evaluation and Treatment Rehabilitation  SUBJECTIVE:   SUBJECTIVE STATEMENT: Pt continues to report some mild back pain, radiating down to the R hip.  Pt accompanied by: self  PERTINENT HISTORY: Per chart, Memory Loss: concerns for Lewy body dementia in a patient with episodic confusion, parkinsonism, dream enactment, hyposmia, visual hallucinations, behavioral changes. Memory loss starting 2018 with cognitive decline over 2022. Patient reports difficulty remembering conversations, difficulty recognizing old friends, getting lost while driving. Per spouse patient can become aggressive behind the wheel. Patient reports visual hallucinations (dark areas out of his periphery) and audio hallucinations. Wife assists with medications and finances. Has become more emotional over the last year and sometimes becomes angry with spouse which is unusual. Denies alcohol use or difficulty sleeping. Significant procrastiantion, low motivation.    PRECAUTIONS: Fall  WEIGHT BEARING RESTRICTIONS No  PAIN:  Are you having pain? Yes: NPRS scale: 2/10 Pain location: low back Pain description: spasm, grabbing Aggravating factors: standing/walking Relieving factors: heat, rest, pain meds  FALLS: Has patient fallen in last 6 months? No, but several stumbles; Pt reports he tends to  lose his balance forward and struggles to walk down a ramp.  Pt states he's been caught by another person to prevent a fall while walking down a ramp.  LIVING ENVIRONMENT: Lives with: lives with their spouse Lives in: Other house, 2 levels (upstairs is bonus room but pt rarely goes up there) Stairs: Yes: External: 4 steps; bilateral but cannot reach both Has following equipment at home: Single point cane, uses in community settings in a crowd  PLOF:  retired from [employer],  [employer]  PATIENT GOALS "To get to where I can stand up without falling down.  Get better balance."   OBJECTIVE:  ABC Scale: 60.6% self confident in doing activity without losing balance; 04/26/22: 62.5% Berg: 43/56 (low fall risk), but significant challenge with turning to look behind, 360 turn, standing with 1 foot in front, and single leg stance Berg: 04/26/22: 52/56 Tug: 10 sec without AD (normal) 360 Turn test: 5 sec to the R (increased fall risk); Pt also attempted to turn to the L requiring close SBA d/t very wide BOS and small balance check   04/11/22: 6 MWT: 720 ft (min guard with gait belt, followed by transport chair) BP and HR WNL before/after.   04/26/22: 6 MWT: 1,060 ft supv-intermittent min guard without AD  HAND DOMINANCE: Right   MOBILITY STATUS: difficulty carrying objects with ambulation; when more fatigued, pt shuffles and has anterior lean  POSTURE COMMENTS:  Anterior lean with ambulation , erect sitting and standing Sitting balance: Moves/returns truncal midpoint >2 inches in all planes  ACTIVITY TOLERANCE: Activity tolerance: increased shuffling when fatigued  FUNCTIONAL OUTCOME MEASURES: FOTO: To be assessed next session 04/11/22: 50 04/26/22: 63.5   UPPER EXTREMITY ROM    BUE ROM WNL   UPPER EXTREMITY MMT:    BUEs grossly 5/5  HAND FUNCTION: Grip strength: Right: 50 lbs; Left: 59 lbs, Lateral pinch: Right: 17 lbs, Left: 17 lbs, and 3 point pinch: Right: 13 lbs, Left: 12 lbs  COORDINATION: Finger Nose Finger test: [facility] bilat 9 Hole Peg test: Right: 26 sec; Left: 35 sec   TODAY'S TREATMENT: Neuro re-ed:  LSVT: Patient seen for LSVT Daily Session Maximal Daily Exercises for facilitation/coordination of movement. Maximum Sustained Movements are designed to rescale the amplitude of movement output for generalization to daily functional activities. Performed as follows for 1 set of 10 repetitions each multi-directional sustained movements. 1) Floor to  ceiling  2) Side to side multidirectional: min tactile cues today to step foot back to starting position rather than slide  Repetitive movements performed in standing and are designed to provide retraining effort needed for sustained muscle activation in tasks. Performed as follows for 1 set of 10 repetitions each of multi-directional repetitive movements: 3) Step and reach forward step- mod vc and visual cue for big stepping back to neutral 4) Step and reach sideways step- mod vc and visual cue for big stepping back to neutral  5) Step and reach backwards step- mod vc for form and technique 6) Rock and reach forward/backward- initial min visual cue for form and technique (adapted positioning) 7) Rock and reach sideways- close supv, intermittent min guard with mod tactile and vc for form.  Wall stretch completed for erect posture: 3 reps of 1 min hold, min vc for form Big arm swings with hands touching wall behind him with each back swing for tactile/proprioceptive cue x3 sets 10 reps each Seated marches x2 sets 15 reps, intermittent min vc for maximal knee lift each side  Standing marches with cone taps x3 sets 10 reps each. Big walking: indoor with constant cues for foot clearance.  Completed high reps in 10 ft distance, initiating quick "stops" to reset when pt dragged feet.    Functional Component Task: 1.Sit to stand with supervision 10 reps, intermittent vc for full erect standing with each rep.   2. Buttoning/unbuttoning small buttons on a shirt: not completed today  Hierarchy Tasks 3. Carrying a full laundry basket: Not completed today 4. Walking up/down a ramp: indoor with mod vc for erect posture, higher foot clearance with each step, and min guard.  Initiated frequent quick stops to reset with foot dragging. 5. Transfer into car: Not completed today  PATIENT EDUCATION: Education details: Maximal daily exercises (chair for support with rock and reach sideways) Person educated:  pt Education method: explanation, demo, written handout Education comprehension: verbalized and demonstrated understanding with verbal and tactile cues   HOME EXERCISE PROGRAM: Pt given daily carryover task of maximal daily exercises in the adapted position (in standing with chair for UE support), Big walking, and seated marches.  GOALS: Goals reviewed with patient? Yes  SHORT TERM GOALS: Target date: 03/30/22  Pt will perform maximal daily exercise with min vc and handout. Baseline:  Will initiate next visit; 04/26/22: pt performs with min-mod vc (max vc for rock and reach) Goal status: ongoing  2.  Pt will turn in place (360*) <4 sec with good stability. Baseline: 5 sec, requires close supv d/t balance checks; performs with wide BOS; 04/26/22: turns each direction in 4 sec with close supv Goal status: ongoing  3.  Pt will don a tight sock (compression sock) with modified indep Baseline: Pt reports difficulty donning "tight" socks; 04/26/22: moderately difficult Goal status: ongoing  LONG TERM GOALS: Target date: 05/11/22  Pt will increase FOTO score by 5 or more points to indicate increase in perceived functional performance with daily tasks.  Baseline: Will complete FOTO next session; 04/13/22: 50; 04/26/22: 63 Goal status: ongoing  2.  Pt will safely carry a loaded box or basket with good stability and reduced FOF. Baseline: Requires close supv-min A; 04/26/22: close supv Goal status: ongoing  3.  Pt will increase level of confidence to 70% (confidence that pt will not lose balance) when walking in a crowd where people rapidly walk by.   Baseline: 50% confident; 04/26/22: 50% Goal status: ongoing  4.  Pt will confidently walk up/down a ramp with use of rail with modified indep. Baseline: Requires min A to amb down a ramp, close supv to amb up ramp; 04/26/22: min guard down, supv up  Goal status: ongoing  5.  Pt will increase dexterity in bilat hands to manage clothing  fasteners more efficiently. Baseline: difficulty with fastening small buttons; 04/26/22: pt more consistently using finger flicks and demos good ability to manage small buttons, but reports still struggling with a bottom button on a polo shirt. Goal status: ongoing  6.  Pt will increase level of confidence to 70% (confidence that pt will not lose balance) when transferring in/out of a car. Baseline: 50% confident, 04/26/22: 80% Goal status: ongoing  ASSESSMENT:  CLINICAL IMPRESSION: Pt continues to tolerate standard maximal daily exercises well, requiring adapted version for rock and reach only.  Pt continues to require mod vc to be consistent with big stepping rather than sliding foot back to neutral during maximal daily exercises.  Pt continues to be challenged with multitasking and distractibility, making erect standing, foot clearance, and big  arm swing difficult to coordinate simultaneously for any distance longer than 20-30 ft.  OT initiated quick "stops" during big walking if pt shuffled feet, effective to help pt to reset and lift feet.  Pt will continue to benefit from skilled OT for progression of maximal daily exercises, hierarchy tasks, and functional component tasks for an overall goal of calibrating movements for pt to automatically use bigger/larger amplitude movements in everyday living, as well as to improve strength and balance and decrease risk of falling.  PERFORMANCE DEFICITS in functional skills including ADLs, IADLs, coordination, dexterity, proprioception, sensation, pain, FMC, GMC, mobility, balance, body mechanics, endurance, decreased knowledge of precautions, decreased knowledge of use of DME, and vision, cognitive skills including attention, memory, safety awareness, and thought. IMPAIRMENTS are limiting patient from ADLs, IADLs, rest and sleep, leisure, and social participation.   COMORBIDITIES may have co-morbidities  that affects occupational performance. Patient will  benefit from skilled OT to address above impairments and improve overall function.  MODIFICATION OR ASSISTANCE TO COMPLETE EVALUATION: Min-Moderate modification of tasks or assist with assess necessary to complete an evaluation.  OT OCCUPATIONAL PROFILE AND HISTORY: Problem focused assessment: Including review of records relating to presenting problem.  CLINICAL DECISION MAKING: Moderate - several treatment options, min-mod task modification necessary  REHAB POTENTIAL: Good  EVALUATION COMPLEXITY: Moderate    PLAN: OT FREQUENCY: 2x/week  OT DURATION: 12 weeks  PLANNED INTERVENTIONS: self care/ADL training, therapeutic exercise, therapeutic activity, neuromuscular re-education, gait training, balance training, functional mobility training, patient/family education, cognitive remediation/compensation, energy conservation, and DME and/or AE instructions  RECOMMENDED OTHER SERVICES: N/A  CONSULTED AND AGREED WITH PLAN OF CARE: Patient and family member/caregiver  PLAN FOR NEXT SESSION: Initiate maximal daily exercises, initiate functional component tasks, and hierarchy tasks   Leta Speller, MS, OTR/L  Darleene Cleaver, OT 04/30/2022, 6:40 PM

## 2022-05-02 ENCOUNTER — Ambulatory Visit: Payer: Medicare Other

## 2022-05-02 DIAGNOSIS — R278 Other lack of coordination: Secondary | ICD-10-CM

## 2022-05-02 DIAGNOSIS — R2681 Unsteadiness on feet: Secondary | ICD-10-CM

## 2022-05-02 DIAGNOSIS — G20B1 Parkinson's disease with dyskinesia, without mention of fluctuations: Secondary | ICD-10-CM

## 2022-05-02 NOTE — Therapy (Signed)
OUTPATIENT OCCUPATIONAL THERAPY NEURO/LSVT BIG TREATMENT NOTE  Patient Name: Carlos Mathis MRN: [id number] DOB:1941-04-22, 81 y.o., male Today's Date: 05/02/2022  PCP: Dr. Frazier Richards REFERRING PROVIDER: Dr. Jennings Books   OT End of Session - 05/02/22 1126     Visit Number 14    Number of Visits 17    Date for OT Re-Evaluation 05/11/22    OT Start Time 0830    OT Stop Time 0930    OT Time Calculation (min) 60 min    Activity Tolerance Patient tolerated treatment well    Behavior During Therapy WFL for tasks assessed/performed              Past Medical History:  Diagnosis Date   Arthritis    BPH (benign prostatic hyperplasia)    CKD (chronic kidney disease), stage III ([place])    Coronary artery disease    GERD (gastroesophageal reflux disease)    History of hiatal hernia    HLD (hyperlipidemia)    Hypertension    Long term current use of antithrombotics/antiplatelets    a.) DAPT therapy (ASA+ ticagrelor)   MCI (mild cognitive impairment) with memory loss    a.) takes apoaequorin   Migraines    OSA on CPAP    Pneumonia    Skin cancer, basal cell    STEMI (ST elevation myocardial infarction) ([place]) 07/27/2020   a.) MI while living in [place]; underwent PCI placing 4.0 x 18 mm and 3.5 x 38 mm Resolute Onyx DES (vessels unknown/unspecified).   T2DM (type 2 diabetes mellitus) ([place])    Vertigo    Wears dentures    Full upper, partial lower   Wears hearing aid in both ears    Past Surgical History:  Procedure Laterality Date   CARDIAC CATHETERIZATION     CATARACT EXTRACTION Right    HERNIA REPAIR Left    inguinal   INSERTION OF MESH  09/01/2021   Procedure: INSERTION OF MESH;  Surgeon: Herbert Pun, MD;  Location: [facility] ORS;  Service: General;;   JOINT REPLACEMENT     tka   UMBILICAL HERNIA REPAIR     There are no problems to display for this patient.   ONSET DATE: 01/31/22 (date of referral)  REFERRING DIAG: Parkinson's Disease  THERAPY DIAG:   Other lack of coordination  Parkinson's disease with dyskinesia, unspecified whether manifestations fluctuate  Unsteadiness on feet  Rationale for Evaluation and Treatment Rehabilitation  SUBJECTIVE:   SUBJECTIVE STATEMENT: Pt reported that he didn't make it to the [employer] parade on Saturday because of the rain, but he was pleased to be recognized in church yesterday.  Pt accompanied by: self  PERTINENT HISTORY: Per chart, Memory Loss: concerns for Lewy body dementia in a patient with episodic confusion, parkinsonism, dream enactment, hyposmia, visual hallucinations, behavioral changes. Memory loss starting 2018 with cognitive decline over 2022. Patient reports difficulty remembering conversations, difficulty recognizing old friends, getting lost while driving. Per spouse patient can become aggressive behind the wheel. Patient reports visual hallucinations (dark areas out of his periphery) and audio hallucinations. Wife assists with medications and finances. Has become more emotional over the last year and sometimes becomes angry with spouse which is unusual. Denies alcohol use or difficulty sleeping. Significant procrastiantion, low motivation.    PRECAUTIONS: Fall  WEIGHT BEARING RESTRICTIONS No  PAIN:  Are you having pain? Yes: NPRS scale: 1/10 Pain location: low back Pain description: spasm, grabbing Aggravating factors: standing/walking Relieving factors: heat, rest, pain meds  FALLS: Has patient  fallen in last 6 months? No, but several stumbles; Pt reports he tends to lose his balance forward and struggles to walk down a ramp.  Pt states he's been caught by another person to prevent a fall while walking down a ramp.  LIVING ENVIRONMENT: Lives with: lives with their spouse Lives in: Other house, 2 levels (upstairs is bonus room but pt rarely goes up there) Stairs: Yes: External: 4 steps; bilateral but cannot reach both Has following equipment at home: Single point cane,  uses in community settings in a crowd  PLOF:  retired from [employer], [employer]  PATIENT GOALS "To get to where I can stand up without falling down.  Get better balance."   OBJECTIVE:  ABC Scale: 60.6% self confident in doing activity without losing balance; 04/26/22: 62.5% Berg: 43/56 (low fall risk), but significant challenge with turning to look behind, 360 turn, standing with 1 foot in front, and single leg stance Berg: 04/26/22: 52/56 Tug: 10 sec without AD (normal) 360 Turn test: 5 sec to the R (increased fall risk); Pt also attempted to turn to the L requiring close SBA d/t very wide BOS and small balance check   04/11/22: 6 MWT: 720 ft (min guard with gait belt, followed by transport chair) BP and HR WNL before/after.   04/26/22: 6 MWT: 1,060 ft supv-intermittent min guard without AD  HAND DOMINANCE: Right   MOBILITY STATUS: difficulty carrying objects with ambulation; when more fatigued, pt shuffles and has anterior lean  POSTURE COMMENTS:  Anterior lean with ambulation , erect sitting and standing Sitting balance: Moves/returns truncal midpoint >2 inches in all planes  ACTIVITY TOLERANCE: Activity tolerance: increased shuffling when fatigued  FUNCTIONAL OUTCOME MEASURES: FOTO: To be assessed next session 04/11/22: 50 04/26/22: 63.5   UPPER EXTREMITY ROM    BUE ROM WNL   UPPER EXTREMITY MMT:    BUEs grossly 5/5  HAND FUNCTION: Grip strength: Right: 50 lbs; Left: 59 lbs, Lateral pinch: Right: 17 lbs, Left: 17 lbs, and 3 point pinch: Right: 13 lbs, Left: 12 lbs  COORDINATION: Finger Nose Finger test: [facility] bilat 9 Hole Peg test: Right: 26 sec; Left: 35 sec   TODAY'S TREATMENT: Neuro re-ed:  LSVT: Patient seen for LSVT Daily Session Maximal Daily Exercises for facilitation/coordination of movement. Maximum Sustained Movements are designed to rescale the amplitude of movement output for generalization to daily functional activities. Performed as follows for 1  set of 10 repetitions each multi-directional sustained movements. 1) Floor to ceiling  2) Side to side multidirectional: min tactile cues today to step foot back to starting position rather than slide  Repetitive movements performed in standing and are designed to provide retraining effort needed for sustained muscle activation in tasks. Performed as follows for 1 set of 10 repetitions each of multi-directional repetitive movements: 3) Step and reach forward step- min vc and visual cue for big stepping back to neutral 4) Step and reach sideways step- min tactile cue for arm position, min vc and visual cue for big stepping back to neutral  5) Step and reach backwards step- min vc for form and technique 6) Rock and reach forward/backward- initial min visual cue for form and technique (adapted positioning) 7) Rock and reach sideways- close supv, min tactile cues for form/technique.  Wall stretch completed for erect posture: 3 reps of 1 min hold Big arm swings with hands touching wall behind him with each back swing for tactile/proprioceptive cue x3 sets 10 reps each, intermittent min vc for  erect standing posture Seated marches x2 sets 15 reps, intermittent min vc for maximal knee lift on R side only. Standing marches with cone taps x3 sets 10 reps each.   Big walking: indoor with mod cues for foot clearance.  Completed high reps in 10 ft distance, initiating quick "stops" to reset when pt dragged feet.  Practiced figure 8 around cones with each 10 ft distance with mod vc to increase foot clearance during turns. Practiced 360 turns within 4 cones with cues for marching steps to turn R/L x3 reps each direction, close supv.   Functional Component Task: 1.Sit to stand with supervision 10 reps, initial min visual and vc for full erect standing and big arms with each rep.   2. Buttoning/unbuttoning small buttons on a shirt: not completed today 3. Donning/doffing socks: x2 each foot; pt wearing  soft/cotton socks.  Able to don with figure 4 each way, no difficulty.  Pt states that his tighter socks are more difficulty to gather and pull up foot.  Encouraged pt to bring those socks within him to next tx session.  Hierarchy Tasks 3. Carrying a full laundry basket: Carried Armed forces training and education officer with 5# dumbbell 10 ft distances x3 reps with focus on foot clearance and erect posture with each step; min vc 4. Walking up/down a ramp: indoor with min vc for erect posture and marching steps up the ramp, intermittent tactile cues for erect posture walking down the ramp.  Also required cues to slow pace and increase foot clearance for better control down the ramp.  Able to complete without hand rail and close supv x3 reps. 5. Transfer into car: Not completed today  PATIENT EDUCATION: Education details: Maximal daily exercises (chair for support with rock and reach sideways) Person educated: pt Education method: explanation, demo, written handout Education comprehension: verbalized and demonstrated understanding with verbal and tactile cues   HOME EXERCISE PROGRAM: Pt given daily carryover task of maximal daily exercises in the adapted position (in standing with chair for UE support), Big walking, and seated marches.  GOALS: Goals reviewed with patient? Yes  SHORT TERM GOALS: Target date: 03/30/22  Pt will perform maximal daily exercise with min vc and handout. Baseline:  Will initiate next visit; 04/26/22: pt performs with min-mod vc (max vc for rock and reach) Goal status: ongoing  2.  Pt will turn in place (360*) <4 sec with good stability. Baseline: 5 sec, requires close supv d/t balance checks; performs with wide BOS; 04/26/22: turns each direction in 4 sec with close supv Goal status: ongoing  3.  Pt will don a tight sock (compression sock) with modified indep Baseline: Pt reports difficulty donning "tight" socks; 04/26/22: moderately difficult Goal status: ongoing  LONG TERM GOALS: Target  date: 05/11/22  Pt will increase FOTO score by 5 or more points to indicate increase in perceived functional performance with daily tasks.  Baseline: Will complete FOTO next session; 04/13/22: 50; 04/26/22: 63 Goal status: ongoing  2.  Pt will safely carry a loaded box or basket with good stability and reduced FOF. Baseline: Requires close supv-min A; 04/26/22: close supv Goal status: ongoing  3.  Pt will increase level of confidence to 70% (confidence that pt will not lose balance) when walking in a crowd where people rapidly walk by.   Baseline: 50% confident; 04/26/22: 50% Goal status: ongoing  4.  Pt will confidently walk up/down a ramp with use of rail with modified indep. Baseline: Requires min A to amb down a  ramp, close supv to amb up ramp; 04/26/22: min guard down, supv up  Goal status: ongoing  5.  Pt will increase dexterity in bilat hands to manage clothing fasteners more efficiently. Baseline: difficulty with fastening small buttons; 04/26/22: pt more consistently using finger flicks and demos good ability to manage small buttons, but reports still struggling with a bottom button on a polo shirt. Goal status: ongoing  6.  Pt will increase level of confidence to 70% (confidence that pt will not lose balance) when transferring in/out of a car. Baseline: 50% confident, 04/26/22: 80% Goal status: ongoing  ASSESSMENT: Pt was able to perform 3 sets of 10 standing marches without knocking down cones, knocking down a cone and restarting only 1 time (previous session required 5-6 attempts to complete 3 sets without knocking down cones.  Added 5lb weight to basket today to simulate carrying laundry basket.  Pt required min vc for posture and foot clearance when carrying basket reps of 10 ft distances.  Less cueing for foot clearance when turning corners in a figure 8 pattern over 10 ft.  Focussed today on combining foot clearance and maintaining erect posture with these 10 ft distances.  Pt  continues to respond well to quick stops to cue him when feet drag, allowing pt to stop and reset and begin again with marching steps.  Pt is steadily improving ability to negotiate ramps.  Pt was able to amb down ramp today x3 with supv, 1-2 tactile cues for erect posture, slowing speed, and increasing foot clearance.  Pt reported that he could tell that when he stands up straight this helps to slow his pace down the ramp for better control.  Pt will continue to benefit from skilled OT for progression of maximal daily exercises, hierarchy tasks, and functional component tasks for an overall goal of calibrating movements for pt to automatically use bigger/larger amplitude movements in everyday living, as well as to improve strength and balance and decrease risk of falling.  PERFORMANCE DEFICITS in functional skills including ADLs, IADLs, coordination, dexterity, proprioception, sensation, pain, FMC, GMC, mobility, balance, body mechanics, endurance, decreased knowledge of precautions, decreased knowledge of use of DME, and vision, cognitive skills including attention, memory, safety awareness, and thought. IMPAIRMENTS are limiting patient from ADLs, IADLs, rest and sleep, leisure, and social participation.   COMORBIDITIES may have co-morbidities  that affects occupational performance. Patient will benefit from skilled OT to address above impairments and improve overall function.  MODIFICATION OR ASSISTANCE TO COMPLETE EVALUATION: Min-Moderate modification of tasks or assist with assess necessary to complete an evaluation.  OT OCCUPATIONAL PROFILE AND HISTORY: Problem focused assessment: Including review of records relating to presenting problem.  CLINICAL DECISION MAKING: Moderate - several treatment options, min-mod task modification necessary  REHAB POTENTIAL: Good  EVALUATION COMPLEXITY: Moderate    PLAN: OT FREQUENCY: 2x/week  OT DURATION: 12 weeks  PLANNED INTERVENTIONS: self care/ADL  training, therapeutic exercise, therapeutic activity, neuromuscular re-education, gait training, balance training, functional mobility training, patient/family education, cognitive remediation/compensation, energy conservation, and DME and/or AE instructions  RECOMMENDED OTHER SERVICES: N/A  CONSULTED AND AGREED WITH PLAN OF CARE: Patient and family member/caregiver  PLAN FOR NEXT SESSION: Initiate maximal daily exercises, initiate functional component tasks, and hierarchy tasks   Leta Speller, MS, OTR/L  Darleene Cleaver, OT 05/02/2022, 11:27 AM

## 2022-05-03 ENCOUNTER — Encounter
Admission: RE | Disposition: A | Discharge status: Home / Self Care | Payer: Self-pay | Source: Ambulatory Visit | Attending: Ophthalmology

## 2022-05-03 ENCOUNTER — Ambulatory Visit
Admission: RE | Admit: 2022-05-03 | Discharge: 2022-05-03 | Disposition: A | Discharge status: Home / Self Care | Payer: Medicare Other | Source: Ambulatory Visit | Attending: Ophthalmology | Admitting: Ophthalmology

## 2022-05-03 ENCOUNTER — Other Ambulatory Visit: Payer: Self-pay

## 2022-05-03 ENCOUNTER — Ambulatory Visit: Payer: Medicare Other | Admitting: Anesthesiology

## 2022-05-03 ENCOUNTER — Encounter: Payer: Self-pay | Admitting: Ophthalmology

## 2022-05-03 DIAGNOSIS — Z96659 Presence of unspecified artificial knee joint: Secondary | ICD-10-CM | POA: Diagnosis not present

## 2022-05-03 DIAGNOSIS — Z85828 Personal history of other malignant neoplasm of skin: Secondary | ICD-10-CM | POA: Diagnosis not present

## 2022-05-03 DIAGNOSIS — N400 Enlarged prostate without lower urinary tract symptoms: Secondary | ICD-10-CM | POA: Insufficient documentation

## 2022-05-03 DIAGNOSIS — Z7902 Long term (current) use of antithrombotics/antiplatelets: Secondary | ICD-10-CM | POA: Diagnosis not present

## 2022-05-03 DIAGNOSIS — G3184 Mild cognitive impairment, so stated: Secondary | ICD-10-CM | POA: Insufficient documentation

## 2022-05-03 DIAGNOSIS — I129 Hypertensive chronic kidney disease with stage 1 through stage 4 chronic kidney disease, or unspecified chronic kidney disease: Secondary | ICD-10-CM | POA: Insufficient documentation

## 2022-05-03 DIAGNOSIS — N183 Chronic kidney disease, stage 3 unspecified: Secondary | ICD-10-CM | POA: Diagnosis not present

## 2022-05-03 DIAGNOSIS — I2510 Atherosclerotic heart disease of native coronary artery without angina pectoris: Secondary | ICD-10-CM | POA: Diagnosis not present

## 2022-05-03 DIAGNOSIS — I252 Old myocardial infarction: Secondary | ICD-10-CM | POA: Insufficient documentation

## 2022-05-03 DIAGNOSIS — E785 Hyperlipidemia, unspecified: Secondary | ICD-10-CM | POA: Diagnosis not present

## 2022-05-03 DIAGNOSIS — G20A1 Parkinson's disease without dyskinesia, without mention of fluctuations: Secondary | ICD-10-CM | POA: Insufficient documentation

## 2022-05-03 DIAGNOSIS — Z7982 Long term (current) use of aspirin: Secondary | ICD-10-CM | POA: Diagnosis not present

## 2022-05-03 DIAGNOSIS — Z79899 Other long term (current) drug therapy: Secondary | ICD-10-CM | POA: Insufficient documentation

## 2022-05-03 DIAGNOSIS — Z7984 Long term (current) use of oral hypoglycemic drugs: Secondary | ICD-10-CM | POA: Insufficient documentation

## 2022-05-03 DIAGNOSIS — Z955 Presence of coronary angioplasty implant and graft: Secondary | ICD-10-CM | POA: Diagnosis not present

## 2022-05-03 DIAGNOSIS — E1136 Type 2 diabetes mellitus with diabetic cataract: Secondary | ICD-10-CM | POA: Insufficient documentation

## 2022-05-03 DIAGNOSIS — G4733 Obstructive sleep apnea (adult) (pediatric): Secondary | ICD-10-CM | POA: Diagnosis not present

## 2022-05-03 DIAGNOSIS — Z87891 Personal history of nicotine dependence: Secondary | ICD-10-CM | POA: Insufficient documentation

## 2022-05-03 HISTORY — PX: CATARACT EXTRACTION W/PHACO: SHX586

## 2022-05-03 HISTORY — DX: Presence of dental prosthetic device (complete) (partial): Z97.2

## 2022-05-03 HISTORY — DX: Dizziness and giddiness: R42

## 2022-05-03 HISTORY — DX: Presence of external hearing-aid: Z97.4

## 2022-05-03 LAB — GLUCOSE, CAPILLARY: Glucose-Capillary: 182 mg/dL — ABNORMAL HIGH (ref 70–99)

## 2022-05-03 SURGERY — PHACOEMULSIFICATION, CATARACT, WITH IOL INSERTION
Anesthesia: Monitor Anesthesia Care | Site: Eye | Laterality: Left

## 2022-05-03 MED ORDER — ARMC OPHTHALMIC DILATING DROPS
1.0000 | OPHTHALMIC | Status: DC | PRN
  Administered 2022-05-03 (×3): 1 via OPHTHALMIC

## 2022-05-03 MED ORDER — SIGHTPATH DOSE#1 BSS IO SOLN
INTRAOCULAR | Status: DC | PRN
  Administered 2022-05-03: 15 mL

## 2022-05-03 MED ORDER — BRIMONIDINE TARTRATE-TIMOLOL 0.2-0.5 % OP SOLN
OPHTHALMIC | Status: DC | PRN
  Administered 2022-05-03: 1 [drp] via OPHTHALMIC

## 2022-05-03 MED ORDER — LACTATED RINGERS IV SOLN: INTRAVENOUS | Status: DC

## 2022-05-03 MED ORDER — SIGHTPATH DOSE#1 BSS IO SOLN
INTRAOCULAR | Status: DC | PRN
  Administered 2022-05-03: 64 mL via OPHTHALMIC

## 2022-05-03 MED ORDER — SIGHTPATH DOSE#1 BSS IO SOLN
INTRAOCULAR | Status: DC | PRN
  Administered 2022-05-03: 1 mL

## 2022-05-03 MED ORDER — FENTANYL CITRATE (PF) 100 MCG/2ML IJ SOLN
INTRAMUSCULAR | Status: DC | PRN
  Administered 2022-05-03: 50 ug via INTRAVENOUS

## 2022-05-03 MED ORDER — TETRACAINE HCL 0.5 % OP SOLN
1.0000 [drp] | OPHTHALMIC | Status: DC | PRN
  Administered 2022-05-03 (×2): 1 [drp] via OPHTHALMIC

## 2022-05-03 MED ORDER — TETRACAINE HCL 0.5 % OP SOLN
1.0000 [drp] | OPHTHALMIC | Status: DC | PRN
  Administered 2022-05-03: 1 [drp] via OPHTHALMIC

## 2022-05-03 MED ORDER — MIDAZOLAM HCL 2 MG/2ML IJ SOLN
INTRAMUSCULAR | Status: DC | PRN
  Administered 2022-05-03: 1 mg via INTRAVENOUS

## 2022-05-03 MED ORDER — MOXIFLOXACIN HCL 0.5 % OP SOLN
OPHTHALMIC | Status: DC | PRN
  Administered 2022-05-03: .2 mL via OPHTHALMIC

## 2022-05-03 MED ORDER — SIGHTPATH DOSE#1 NA CHONDROIT SULF-NA HYALURON 40-17 MG/ML IO SOLN
INTRAOCULAR | Status: DC | PRN
  Administered 2022-05-03: 1 mL via INTRAOCULAR

## 2022-05-03 SURGICAL SUPPLY — 17 items
CANNULA ANT/CHMB 27G (MISCELLANEOUS) IMPLANT
CANNULA ANT/CHMB 27GA (MISCELLANEOUS) IMPLANT
CATARACT SUITE SIGHTPATH (MISCELLANEOUS) ×1 IMPLANT
FEE CATARACT SUITE SIGHTPATH (MISCELLANEOUS) ×1 IMPLANT
GLOVE SURG ENC TEXT LTX SZ8 (GLOVE) ×1 IMPLANT
GLOVE SURG TRIUMPH 8.0 PF LTX (GLOVE) ×1 IMPLANT
LENS IOL EYHANCE TORIC   21.5 ×1 IMPLANT
LENS IOL EYHANCE TORIC  21.5 ×1 IMPLANT
LENS IOL EYHANCE TRC 300 21.5 IMPLANT
NDL FILTER BLUNT 18X1 1/2 (NEEDLE) ×1 IMPLANT
NEEDLE FILTER BLUNT 18X1 1/2 (NEEDLE) ×1 IMPLANT
PACK VIT ANT 23G (MISCELLANEOUS) IMPLANT
RING MALYGIN (MISCELLANEOUS) IMPLANT
SUT ETHILON 10-0 CS-B-6CS-B-6 (SUTURE)
SUTURE EHLN 10-0 CS-B-6CS-B-6 (SUTURE) IMPLANT
SYR 3ML LL SCALE MARK (SYRINGE) ×1 IMPLANT
WATER STERILE IRR 250ML POUR (IV SOLUTION) ×1 IMPLANT

## 2022-05-03 NOTE — Op Note (Signed)
PREOPERATIVE DIAGNOSIS:  Nuclear sclerotic cataract of the left eye.   POSTOPERATIVE DIAGNOSIS:  Nuclear sclerotic cataract of the left eye.   OPERATIVE PROCEDURE: Procedure(s): CATARACT EXTRACTION PHACO AND INTRAOCULAR LENS PLACEMENT (IOC) LEFT DIABETIC   SURGEON:  Birder Robson, MD.   ANESTHESIA: 1.      Managed anesthesia care. 2.     0.64m os Shugarcaine was instilled following the paracentesis [email or web address]   COMPLICATIONS:  None.   TECHNIQUE:   Stop and chop    DESCRIPTION OF PROCEDURE:  The patient was examined and consented in the preoperative holding area where the aforementioned topical anesthesia was applied to the left eye.  The patient was brought back to the Operating Room where he was sat upright on the gurney and given a target to fixate upon while the eye was marked at the 3:00 and 9:00 position.  The patient was then reclined on the operating table.  The eye was prepped and draped in the usual sterile ophthalmic fashion and a lid speculum was placed. A paracentesis was created with the side port blade and the anterior chamber was filled with viscoelastic. A near clear corneal incision was performed with the steel keratome. A continuous curvilinear capsulorrhexis was performed with a cystotome followed by the capsulorrhexis forceps. Hydrodissection and hydrodelineation were carried out with BSS on a blunt cannula. The lens was removed in a stop and chop technique and the remaining cortical material was removed with the irrigation-aspiration handpiece. The eye was inflated with viscoelastic and the ZCT lens was placed in the eye and rotated to within a few degrees of the predetermined orientation.  The remaining viscoelastic was removed from the eye.  The Sinskey hook was used to rotate the toric lens into its final resting place at 161 degrees.  0.1 ml of Vigamox was placed in the anterior chamber. The eye was inflated to a physiologic pressure and found to be watertight.  The  eye was dressed with Vigamox and CombiganThe patient was given protective glasses to wear throughout the day and a shield with which to sleep tonight. The patient was also given drops with which to begin a drop regimen today and will follow-up with me in one day. Implant Name Type Inv. Item Serial No. Manufacturer Lot No. LRB No. Used Action  LENS IOL EYHANCE TORIC   21.5 - [id number] LENS IOL EYHANCE TORIC   21.5 [id number] Implanted   Procedure(s) with comments: CATARACT EXTRACTION PHACO AND INTRAOCULAR LENS PLACEMENT (IOC) LEFT DIABETIC (Left) - 14.77 1:24.0  Electronically signed: WBirder Robson11/14/202310:20 AM

## 2022-05-03 NOTE — Anesthesia Preprocedure Evaluation (Signed)
Anesthesia Evaluation  Patient identified by MRN, date of birth, ID band Patient awake    Reviewed: Allergy & Precautions, NPO status , Patient's Chart, lab work & pertinent test results  History of Anesthesia Complications Negative for: history of anesthetic complications  Airway Mallampati: II  TM Distance: >3 FB Neck ROM: Full    Dental  (+) Upper Dentures, Partial Lower, Dental Advidsory Given   Pulmonary neg pulmonary ROS, neg shortness of breath, sleep apnea and Continuous Positive Airway Pressure Ventilation , neg COPD, neg recent URI, former smoker   Pulmonary exam normal  + decreased breath sounds      Cardiovascular Exercise Tolerance: Good hypertension, Pt. on medications (-) angina + CAD, + Past MI and + Cardiac Stents  negative cardio ROS Normal cardiovascular exam(-) dysrhythmias  Rhythm:Regular Rate:Normal     Neuro/Psych  Headaches, neg Seizures Parkinson's dz  negative psych ROS   GI/Hepatic Neg liver ROS, hiatal hernia,GERD  ,,  Endo/Other  negative endocrine ROSdiabetes, Well Controlled, Type 2, Oral Hypoglycemic Agents    Renal/GU CRFRenal disease     Musculoskeletal  (+) Arthritis ,    Abdominal  (+) + obese  Peds negative pediatric ROS (+)  Hematology negative hematology ROS (+)   Anesthesia Other Findings Past Medical History: No date: Arthritis No date: BPH (benign prostatic hyperplasia) No date: CKD (chronic kidney disease), stage III ([facility]) No date: Coronary artery disease No date: GERD (gastroesophageal reflux disease) No date: History of hiatal hernia No date: HLD (hyperlipidemia) No date: Hypertension No date: Long term current use of antithrombotics/antiplatelets     Comment:  a.) DAPT therapy (ASA+ ticagrelor) No date: MCI (mild cognitive impairment) with memory loss     Comment:  a.) takes apoaequorin No date: Migraines No date: OSA on CPAP No date: Pneumonia No date: Skin  cancer, basal cell 07/27/2020: STEMI (ST elevation myocardial infarction) ([place])     Comment:  a.) MI while living in [place]; underwent PCI placing               4.0 x 18 mm and 3.5 x 38 mm Resolute Onyx DES (vessels               unknown/unspecified). No date: T2DM (type 2 diabetes mellitus) ([place])  Past Surgical History: No date: CARDIAC CATHETERIZATION No date: CATARACT EXTRACTION; Right No date: HERNIA REPAIR; Left     Comment:  inguinal No date: JOINT REPLACEMENT     Comment:  tka No date: UMBILICAL HERNIA REPAIR  BMI    Body Mass Index: 25.83 kg/m      Reproductive/Obstetrics negative OB ROS                             Anesthesia Physical Anesthesia Plan  ASA: 3  Anesthesia Plan: MAC   Post-op Pain Management:    Induction: Intravenous  PONV Risk Score and Plan: Midazolam and Treatment may vary due to age or medical condition  Airway Management Planned: Natural Airway and Nasal Cannula  Additional Equipment:   Intra-op Plan:   Post-operative Plan:   Informed Consent: I have reviewed the patients History and Physical, chart, labs and discussed the procedure including the risks, benefits and alternatives for the proposed anesthesia with the patient or authorized representative who has indicated his/her understanding and acceptance.     Dental Advisory Given  Plan Discussed with: CRNA and Surgeon  Anesthesia Plan Comments:  Anesthesia Quick Evaluation  

## 2022-05-03 NOTE — Anesthesia Postprocedure Evaluation (Signed)
Anesthesia Post Note  Patient: Carlos Mathis  Procedure(s) Performed: CATARACT EXTRACTION PHACO AND INTRAOCULAR LENS PLACEMENT (IOC) LEFT DIABETIC (Left: Eye)  Patient location during evaluation: PACU Anesthesia Type: MAC Level of consciousness: awake and alert Pain management: pain level controlled Vital Signs Assessment: post-procedure vital signs reviewed and stable Respiratory status: spontaneous breathing, nonlabored ventilation, respiratory function stable and patient connected to nasal cannula oxygen Cardiovascular status: stable and blood pressure returned to baseline Postop Assessment: no apparent nausea or vomiting Anesthetic complications: no   No notable events documented.   Last Vitals:  Vitals:   05/03/22 1022 05/03/22 1028  BP: 114/70 101/76  Pulse: 96   Resp:  11  Temp: 36.8 C   SpO2: 96%     Last Pain:  Vitals:   05/03/22 1028  TempSrc:   PainSc: 0-No pain                 Martha Clan

## 2022-05-03 NOTE — Transfer of Care (Signed)
Immediate Anesthesia Transfer of Care Note  Patient: Carlos Mathis  Procedure(s) Performed: CATARACT EXTRACTION PHACO AND INTRAOCULAR LENS PLACEMENT (IOC) LEFT DIABETIC (Left: Eye)  Patient Location: PACU  Anesthesia Type: MAC  Level of Consciousness: awake, alert  and patient cooperative  Airway and Oxygen Therapy: Patient Spontanous Breathing and Patient connected to supplemental oxygen  Post-op Assessment: Post-op Vital signs reviewed, Patient's Cardiovascular Status Stable, Respiratory Function Stable, Patent Airway and No signs of Nausea or vomiting  Post-op Vital Signs: Reviewed and stable  Complications: No notable events documented.

## 2022-05-03 NOTE — H&P (Signed)
[facility]   Primary Care Physician:  Kirk Ruths, MD Ophthalmologist: Dr. George Ina  Pre-Procedure History & Physical: HPI:  Carlos Mathis is a 81 y.o. male here for cataract surgery.   Past Medical History:  Diagnosis Date   Arthritis    BPH (benign prostatic hyperplasia)    CKD (chronic kidney disease), stage III (HCC)    Coronary artery disease    GERD (gastroesophageal reflux disease)    History of hiatal hernia    HLD (hyperlipidemia)    Hypertension    Long term current use of antithrombotics/antiplatelets    a.) DAPT therapy (ASA+ ticagrelor)   MCI (mild cognitive impairment) with memory loss    a.) takes apoaequorin   Migraines    OSA on CPAP    Pneumonia    Skin cancer, basal cell    STEMI (ST elevation myocardial infarction) ([place]) 07/27/2020   a.) MI while living in [place]; underwent PCI placing 4.0 x 18 mm and 3.5 x 38 mm Resolute Onyx DES (vessels unknown/unspecified).   T2DM (type 2 diabetes mellitus) ([place])    Vertigo    Wears dentures    Full upper, partial lower   Wears hearing aid in both ears     Past Surgical History:  Procedure Laterality Date   CARDIAC CATHETERIZATION     CATARACT EXTRACTION Right    HERNIA REPAIR Left    inguinal   INSERTION OF MESH  09/01/2021   Procedure: INSERTION OF MESH;  Surgeon: Herbert Pun, MD;  Location: [facility] ORS;  Service: General;;   JOINT REPLACEMENT     tka   UMBILICAL HERNIA REPAIR      Prior to Admission medications   Medication Sig Start Date End Date Taking? Authorizing Provider  aspirin 81 MG EC tablet Take 81 mg by mouth daily. 10/06/04  Yes [provider]  atorvastatin (LIPITOR) 40 MG tablet Take 40 mg by mouth every evening. 07/09/19  Yes [provider]  donepezil (ARICEPT ODT) 10 MG disintegrating tablet Take 5 mg by mouth at bedtime.   Yes [provider]  empagliflozin (JARDIANCE) 25 MG TABS tablet Take 12.5 mg by mouth every morning.   Yes  [provider]  glucose blood ([employer] BLOOD GLUCOSE TEST) test strip USE 1 STRIP FOR TESTING AS DIRECTED EVERY DAY TO TEST BLOOD SUGAR 05/18/20  Yes [provider]  losartan (COZAAR) 25 MG tablet Take 25 mg by mouth every evening. 08/13/20  Yes [provider]  meclizine (ANTIVERT) 25 MG tablet Take 25 mg by mouth 3 (three) times daily as needed for dizziness.   Yes [provider]  metFORMIN (GLUMETZA) 500 MG (MOD) 24 hr tablet Take 500 mg by mouth every evening.   Yes [provider]  metoprolol succinate (TOPROL-XL) 50 MG 24 hr tablet 25 mg every morning. 01/17/06  Yes [provider]  Multiple Vitamin (MULTIVITAMIN) capsule Take 1 capsule by mouth daily.   Yes [provider]  nitroGLYCERIN (NITROSTAT) 0.4 MG SL tablet Place 0.4 mg under the tongue every 5 (five) minutes as needed for chest pain. 08/13/20  Yes [provider]  Omega-3 Fatty Acids (OMEGA-3 FISH OIL) 1200 MG CAPS Take 1,200 mg by mouth daily.   Yes [provider]  OVER THE COUNTER MEDICATION Take 1 capsule by mouth daily. Nerve Control 911   Yes [provider]  tamsulosin (FLOMAX) 0.4 MG CAPS capsule Take 0.4 mg by mouth every evening.   Yes [provider]  glucose blood test strip USE 1 STRIP FOR TESTING AS DIRECTED EVERY DAY TO TEST BLOOD SUGAR 05/18/20   [provider]  lidocaine (LIDODERM) 5 % Place 1 patch onto the skin daily. Remove & Discard patch within 12 hours or as directed by MD Patient not taking: Reported on 04/26/2022 04/09/22   Rada Hay, MD  rivastigmine (EXELON) 1.5 MG capsule Take 1.5 mg by mouth 2 (two) times daily. Patient not taking: Reported on 04/26/2022    [provider]    Allergies as of 02/17/2022 - Review Complete 09/01/2021  Allergen Reaction Noted   Atorvastatin  12/28/2004   Meperidine hcl Nausea Only 08/14/2008    History reviewed. No pertinent family  history.  Social History   Socioeconomic History   Marital status: Married    Spouse name: Not on file   Number of children: Not on file   Years of education: Not on file   Highest education level: Not on file  Occupational History   Not on file  Tobacco Use   Smoking status: Former    Packs/day: 1.00    Years: 20.00    Total pack years: 20.00    Types: Cigarettes    Quit date: 09/11/1983    Years since quitting: 38.6   Smokeless tobacco: Former    Types: Chew    Quit date: 09/11/1983  Vaping Use   Vaping Use: Never used  Substance and Sexual Activity   Alcohol use: Not Currently   Drug use: Not Currently   Sexual activity: Not on file  Other Topics Concern   Not on file  Social History Narrative   Not on file   Social Determinants of Health   Financial Resource Strain: Not on file  Food Insecurity: Not on file  Transportation Needs: Not on file  Physical Activity: Not on file  Stress: Not on file  Social Connections: Not on file  Intimate Partner Violence: Not on file    Review of Systems: See HPI, otherwise negative ROS  Physical Exam: BP 119/68   Pulse 84   Temp (!) 97.5 F (36.4 C) (Temporal)   Resp 18   Ht '5\' 10"'$  (1.778 m)   Wt 84.8 kg   SpO2 96%   BMI 26.83 kg/m  General:   Alert, cooperative in NAD Head:  Normocephalic and atraumatic. Respiratory:  Normal work of breathing. Cardiovascular:  RRR  Impression/Plan: Carlos Mathis is here for cataract surgery.  Risks, benefits, limitations, and alternatives regarding cataract surgery have been reviewed with the patient.  Questions have been answered.  All parties agreeable.   Birder Robson, MD  05/03/2022, 9:44 AM

## 2022-05-04 ENCOUNTER — Encounter: Payer: Self-pay | Admitting: Ophthalmology

## 2022-05-04 ENCOUNTER — Ambulatory Visit: Payer: Medicare Other

## 2022-05-04 DIAGNOSIS — R2681 Unsteadiness on feet: Secondary | ICD-10-CM

## 2022-05-04 DIAGNOSIS — R278 Other lack of coordination: Secondary | ICD-10-CM

## 2022-05-04 DIAGNOSIS — G20B1 Parkinson's disease with dyskinesia, without mention of fluctuations: Secondary | ICD-10-CM

## 2022-05-04 NOTE — Therapy (Signed)
OUTPATIENT OCCUPATIONAL THERAPY NEURO/LSVT BIG TREATMENT NOTE  Patient Name: Carlos Mathis MRN: [id number] DOB:06-06-1941, 81 y.o., male Today's Date: 05/04/2022  PCP: Dr. Frazier Richards REFERRING PROVIDER: Dr. Jennings Books   OT End of Session - 05/04/22 1045     Visit Number 15    Number of Visits 17    Date for OT Re-Evaluation 05/11/22    OT Start Time 0830    OT Stop Time 0930    OT Time Calculation (min) 60 min    Activity Tolerance Patient tolerated treatment well    Behavior During Therapy WFL for tasks assessed/performed              Past Medical History:  Diagnosis Date   Arthritis    BPH (benign prostatic hyperplasia)    CKD (chronic kidney disease), stage III ([place])    Coronary artery disease    GERD (gastroesophageal reflux disease)    History of hiatal hernia    HLD (hyperlipidemia)    Hypertension    Long term current use of antithrombotics/antiplatelets    a.) DAPT therapy (ASA+ ticagrelor)   MCI (mild cognitive impairment) with memory loss    a.) takes apoaequorin   Migraines    OSA on CPAP    Pneumonia    Skin cancer, basal cell    STEMI (ST elevation myocardial infarction) ([place]) 07/27/2020   a.) MI while living in [place]; underwent PCI placing 4.0 x 18 mm and 3.5 x 38 mm Resolute Onyx DES (vessels unknown/unspecified).   T2DM (type 2 diabetes mellitus) ([place])    Vertigo    Wears dentures    Full upper, partial lower   Wears hearing aid in both ears    Past Surgical History:  Procedure Laterality Date   CARDIAC CATHETERIZATION     CATARACT EXTRACTION Right    CATARACT EXTRACTION W/PHACO Left 05/03/2022   Procedure: CATARACT EXTRACTION PHACO AND INTRAOCULAR LENS PLACEMENT (IOC) LEFT DIABETIC;  Surgeon: Birder Robson, MD;  Location: [place];  Service: Ophthalmology;  Laterality: Left;  14.77 1:24.0   HERNIA REPAIR Left    inguinal   INSERTION OF MESH  09/01/2021   Procedure: INSERTION OF MESH;  Surgeon: Herbert Pun, MD;  Location: [facility];  Service: General;;   JOINT REPLACEMENT     tka   UMBILICAL HERNIA REPAIR     There are no problems to display for this patient.   ONSET DATE: 01/31/22 (date of referral)  REFERRING DIAG: Parkinson's Disease  THERAPY DIAG:  Other lack of coordination  Parkinson's disease with dyskinesia, unspecified whether manifestations fluctuate  Unsteadiness on feet  Rationale for Evaluation and Treatment Rehabilitation  SUBJECTIVE:   SUBJECTIVE STATEMENT: Pt reports his L eye cataract sx went well yesterday.  Pt is to avoid bending.  Exercises will be modified today and remaining 2 sessions to maintain activity precautions post sx.   Pt accompanied by: self  PERTINENT HISTORY: Per chart, Memory Loss: concerns for Lewy body dementia in a patient with episodic confusion, parkinsonism, dream enactment, hyposmia, visual hallucinations, behavioral changes. Memory loss starting 2018 with cognitive decline over 2022. Patient reports difficulty remembering conversations, difficulty recognizing old friends, getting lost while driving. Per spouse patient can become aggressive behind the wheel. Patient reports visual hallucinations (dark areas out of his periphery) and audio hallucinations. Wife assists with medications and finances. Has become more emotional over the last year and sometimes becomes angry with spouse which is unusual. Denies alcohol use or difficulty sleeping. Significant procrastiantion,  low motivation.    PRECAUTIONS: Fall  WEIGHT BEARING RESTRICTIONS No  PAIN:  Are you having pain? No, just some discomfort in the L eye.  Pt reports it feels like something is in it since cataract removal yesterday.  FALLS: Has patient fallen in last 6 months? No, but several stumbles; Pt reports he tends to lose his balance forward and struggles to walk down a ramp.  Pt states he's been caught by another person to prevent a fall while walking down a ramp.  LIVING  ENVIRONMENT: Lives with: lives with their spouse Lives in: Other house, 2 levels (upstairs is bonus room but pt rarely goes up there) Stairs: Yes: External: 4 steps; bilateral but cannot reach both Has following equipment at home: Single point cane, uses in community settings in a crowd  PLOF:  retired from [employer], [employer]  PATIENT GOALS "To get to where I can stand up without falling down.  Get better balance."   OBJECTIVE:  ABC Scale: 60.6% self confident in doing activity without losing balance; 04/26/22: 62.5% Berg: 43/56 (low fall risk), but significant challenge with turning to look behind, 360 turn, standing with 1 foot in front, and single leg stance Berg: 04/26/22: 52/56 Tug: 10 sec without AD (normal) 360 Turn test: 5 sec to the R (increased fall risk); Pt also attempted to turn to the L requiring close SBA d/t very wide BOS and small balance check   04/11/22: 6 MWT: 720 ft (min guard with gait belt, followed by transport chair) BP and HR WNL before/after.   04/26/22: 6 MWT: 1,060 ft supv-intermittent min guard without AD  HAND DOMINANCE: Right   MOBILITY STATUS: difficulty carrying objects with ambulation; when more fatigued, pt shuffles and has anterior lean  POSTURE COMMENTS:  Anterior lean with ambulation , erect sitting and standing Sitting balance: Moves/returns truncal midpoint >2 inches in all planes  ACTIVITY TOLERANCE: Activity tolerance: increased shuffling when fatigued  FUNCTIONAL OUTCOME MEASURES: FOTO: To be assessed next session 04/11/22: 50 04/26/22: 63.5   UPPER EXTREMITY ROM    BUE ROM WNL   UPPER EXTREMITY MMT:    BUEs grossly 5/5  HAND FUNCTION: Grip strength: Right: 50 lbs; Left: 59 lbs, Lateral pinch: Right: 17 lbs, Left: 17 lbs, and 3 point pinch: Right: 13 lbs, Left: 12 lbs  COORDINATION: Finger Nose Finger test: [facility] bilat 9 Hole Peg test: Right: 26 sec; Left: 35 sec   TODAY'S TREATMENT: Neuro re-ed:  LSVT: Patient seen  for LSVT Daily Session Maximal Daily Exercises for facilitation/coordination of movement. Maximum Sustained Movements are designed to rescale the amplitude of movement output for generalization to daily functional activities. Performed as follows for 1 set of 10 repetitions each multi-directional sustained movements. 1) Floor to ceiling (modified to avoid reach to floor; starting position with arms out, then up, then wide) 2) Side to side multidirectional: min tactile cues today to step foot back to starting position rather than slide  Repetitive movements performed in standing and are designed to provide retraining effort needed for sustained muscle activation in tasks. Performed as follows for 1 set of 10 repetitions each of multi-directional repetitive movements: 3) Step and reach forward step- min vc and visual cue for big stepping back to neutral 4) Step and reach sideways step- min tactile cue for arm position, min vc and visual cue for big stepping back to neutral  5) Step and reach backwards step- min vc for form and technique (avoided bending down on the back  swing) 6) Rock and reach forward/backward- initial min visual cue for form and technique (adapted positioning); (avoided bending down on the back swing) 7) Rock and reach sideways- close supv, min tactile cues for form/technique.  Wall stretch completed for erect posture: 3 reps of 1 min hold Big arm swings with hands touching wall behind him with each back swing for tactile/proprioceptive cue x3 sets 10 reps each, intermittent min vc for erect standing posture Cone drills: Standing marches with cone taps x3 sets 10 reps each.   Practiced 360 turns within 4 cones with cues for marching steps to turn R/L x5 reps each direction, close supv, max vc for erect posture and foot clearance Weaved in/around 4 cones x7 reps; mod vc for foot clearance and erect standing posture Stepped over cones to simulate tandem steps, max vc for foot  clearance over cones rather than stepping sideways around cones, min guard 7 reps.  Functional Component Task: 1.Sit to stand with supervision 5 reps; cued pt to avoid bending to maintain post surgery activity guidelines.   2. Buttoning/unbuttoning small buttons on a shirt: Pt completed at home today with button up shirt; reported no issues. 3. Donning/doffing socks: Pt states he forgot his dress socks today  Hierarchy Tasks 3. Carrying a full laundry basket: not completed today (pt is to avoid lifting) 4. Walking up/down a ramp: indoor with min tactile cues for erect posture and mod vc for marching steps up the ramp, mod tactile cues for erect posture walking down the ramp.  Also required cues to slow pace and increase foot clearance for better control down the ramp.  Able to complete without hand rail and close supv x3 reps. 5. Transfer into car: Not completed today  PATIENT EDUCATION: Education details: Maximal daily exercises (chair for support with rock and reach sideways) Person educated: pt Education method: explanation, demo, written handout Education comprehension: verbalized and demonstrated understanding with verbal and tactile cues   HOME EXERCISE PROGRAM: Pt given daily carryover task of maximal daily exercises in the adapted position (in standing with chair for UE support), Big walking, and seated marches.  GOALS: Goals reviewed with patient? Yes  SHORT TERM GOALS: Target date: 03/30/22  Pt will perform maximal daily exercise with min vc and handout. Baseline:  Will initiate next visit; 04/26/22: pt performs with min-mod vc (max vc for rock and reach) Goal status: ongoing  2.  Pt will turn in place (360*) <4 sec with good stability. Baseline: 5 sec, requires close supv d/t balance checks; performs with wide BOS; 04/26/22: turns each direction in 4 sec with close supv Goal status: ongoing  3.  Pt will don a tight sock (compression sock) with modified indep Baseline: Pt  reports difficulty donning "tight" socks; 04/26/22: moderately difficult Goal status: ongoing  LONG TERM GOALS: Target date: 05/11/22  Pt will increase FOTO score by 5 or more points to indicate increase in perceived functional performance with daily tasks.  Baseline: Will complete FOTO next session; 04/13/22: 50; 04/26/22: 63 Goal status: ongoing  2.  Pt will safely carry a loaded box or basket with good stability and reduced FOF. Baseline: Requires close supv-min A; 04/26/22: close supv Goal status: ongoing  3.  Pt will increase level of confidence to 70% (confidence that pt will not lose balance) when walking in a crowd where people rapidly walk by.   Baseline: 50% confident; 04/26/22: 50% Goal status: ongoing  4.  Pt will confidently walk up/down a ramp with use of  rail with modified indep. Baseline: Requires min A to amb down a ramp, close supv to amb up ramp; 04/26/22: min guard down, supv up  Goal status: ongoing  5.  Pt will increase dexterity in bilat hands to manage clothing fasteners more efficiently. Baseline: difficulty with fastening small buttons; 04/26/22: pt more consistently using finger flicks and demos good ability to manage small buttons, but reports still struggling with a bottom button on a polo shirt. Goal status: ongoing  6.  Pt will increase level of confidence to 70% (confidence that pt will not lose balance) when transferring in/out of a car. Baseline: 50% confident, 04/26/22: 80% Goal status: ongoing  ASSESSMENT: Pt had cataract sx yesterday in L eye.  Adapted exercises today to avoid bending to maintain post surgical activity guidelines.  Good tolerance to maximal daily exercises, continuing to require intermittent vc and tactile cues to initiate big steps rather than sliding feet back to starting position.  Pt improving with standing marches while tapping cones, but requires extensive cues for mobility over and around cones to increase foot clearance.  Pt is  increasing his ability to self correct when he avoids conversation, but when talking, but struggles to multi task and maintain good body mechanics with his walking.  Pt reported that he is starting to catch himself at home if he's dragging his feet and will make a correction.  Pt will continue to benefit from skilled OT for progression of maximal daily exercises, hierarchy tasks, and functional component tasks for an overall goal of calibrating movements for pt to automatically use bigger/larger amplitude movements in everyday living, as well as to improve strength and balance and decrease risk of falling.  PERFORMANCE DEFICITS in functional skills including ADLs, IADLs, coordination, dexterity, proprioception, sensation, pain, FMC, GMC, mobility, balance, body mechanics, endurance, decreased knowledge of precautions, decreased knowledge of use of DME, and vision, cognitive skills including attention, memory, safety awareness, and thought. IMPAIRMENTS are limiting patient from ADLs, IADLs, rest and sleep, leisure, and social participation.   COMORBIDITIES may have co-morbidities  that affects occupational performance. Patient will benefit from skilled OT to address above impairments and improve overall function.  MODIFICATION OR ASSISTANCE TO COMPLETE EVALUATION: Min-Moderate modification of tasks or assist with assess necessary to complete an evaluation.  OT OCCUPATIONAL PROFILE AND HISTORY: Problem focused assessment: Including review of records relating to presenting problem.  CLINICAL DECISION MAKING: Moderate - several treatment options, min-mod task modification necessary  REHAB POTENTIAL: Good  EVALUATION COMPLEXITY: Moderate    PLAN: OT FREQUENCY: 2x/week  OT DURATION: 12 weeks  PLANNED INTERVENTIONS: self care/ADL training, therapeutic exercise, therapeutic activity, neuromuscular re-education, gait training, balance training, functional mobility training, patient/family education,  cognitive remediation/compensation, energy conservation, and DME and/or AE instructions  RECOMMENDED OTHER SERVICES: N/A  CONSULTED AND AGREED WITH PLAN OF CARE: Patient and family member/caregiver  PLAN FOR NEXT SESSION: Initiate maximal daily exercises, initiate functional component tasks, and hierarchy tasks   Leta Speller, MS, OTR/L  Darleene Cleaver, OT 05/04/2022, 10:47 AM

## 2022-05-05 ENCOUNTER — Ambulatory Visit: Payer: Medicare Other

## 2022-05-05 DIAGNOSIS — G20B1 Parkinson's disease with dyskinesia, without mention of fluctuations: Secondary | ICD-10-CM

## 2022-05-05 DIAGNOSIS — R278 Other lack of coordination: Secondary | ICD-10-CM

## 2022-05-05 DIAGNOSIS — R2681 Unsteadiness on feet: Secondary | ICD-10-CM

## 2022-05-06 ENCOUNTER — Ambulatory Visit: Payer: Medicare Other

## 2022-05-06 DIAGNOSIS — R2681 Unsteadiness on feet: Secondary | ICD-10-CM

## 2022-05-06 DIAGNOSIS — G20B1 Parkinson's disease with dyskinesia, without mention of fluctuations: Secondary | ICD-10-CM

## 2022-05-06 DIAGNOSIS — R278 Other lack of coordination: Secondary | ICD-10-CM

## 2022-05-06 NOTE — Therapy (Signed)
OUTPATIENT OCCUPATIONAL THERAPY NEURO/LSVT BIG TREATMENT NOTE  Patient Name: Carlos Mathis MRN: [id number] DOB:[birth year], 82 y.o., male Today's Date: 05/06/2022  PCP: Dr. Frazier Richards REFERRING PROVIDER: Dr. Jennings Books   OT End of Session - 05/06/22 0837     Visit Number 17    Number of Visits 17    Date for OT Re-Evaluation 05/11/22    OT Start Time 0830    OT Stop Time 0930    OT Time Calculation (min) 60 min    Activity Tolerance Patient tolerated treatment well    Behavior During Therapy WFL for tasks assessed/performed              Past Medical History:  Diagnosis Date   Arthritis    BPH (benign prostatic hyperplasia)    CKD (chronic kidney disease), stage III ([place])    Coronary artery disease    GERD (gastroesophageal reflux disease)    History of hiatal hernia    HLD (hyperlipidemia)    Hypertension    Long term current use of antithrombotics/antiplatelets    a.) DAPT therapy (ASA+ ticagrelor)   MCI (mild cognitive impairment) with memory loss    a.) takes apoaequorin   Migraines    OSA on CPAP    Pneumonia    Skin cancer, basal cell    STEMI (ST elevation myocardial infarction) ([place]) 07/27/2020   a.) MI while living in [place]; underwent PCI placing 4.0 x 18 mm and 3.5 x 38 mm Resolute Onyx DES (vessels unknown/unspecified).   T2DM (type 2 diabetes mellitus) ([place])    Vertigo    Wears dentures    Full upper, partial lower   Wears hearing aid in both ears    Past Surgical History:  Procedure Laterality Date   CARDIAC CATHETERIZATION     CATARACT EXTRACTION Right    CATARACT EXTRACTION W/PHACO Left 05/03/2022   Procedure: CATARACT EXTRACTION PHACO AND INTRAOCULAR LENS PLACEMENT (IOC) LEFT DIABETIC;  Surgeon: Birder Robson, MD;  Location: [facility];  Service: Ophthalmology;  Laterality: Left;  14.77 1:24.0   HERNIA REPAIR Left    inguinal   INSERTION OF MESH  09/01/2021   Procedure: INSERTION OF MESH;  Surgeon: Herbert Pun, MD;  Location: [facility];  Service: General;;   JOINT REPLACEMENT     tka   UMBILICAL HERNIA REPAIR     There are no problems to display for this patient.   ONSET DATE: 01/31/22 (date of referral)  REFERRING DIAG: Parkinson's Disease  THERAPY DIAG:  Parkinson's disease with dyskinesia, unspecified whether manifestations fluctuate  Unsteadiness on feet  Other lack of coordination  Rationale for Evaluation and Treatment Rehabilitation  SUBJECTIVE:   SUBJECTIVE STATEMENT: Pt reports his L eye cataract sx went well yesterday.  Pt is to avoid bending.  Exercises will be modified today and remaining 2 sessions to maintain activity precautions post sx.   Pt accompanied by: self  PERTINENT HISTORY: Per chart, Memory Loss: concerns for Lewy body dementia in a patient with episodic confusion, parkinsonism, dream enactment, hyposmia, visual hallucinations, behavioral changes. Memory loss starting 2018 with cognitive decline over 2022. Patient reports difficulty remembering conversations, difficulty recognizing old friends, getting lost while driving. Per spouse patient can become aggressive behind the wheel. Patient reports visual hallucinations (dark areas out of his periphery) and audio hallucinations. Wife assists with medications and finances. Has become more emotional over the last year and sometimes becomes angry with spouse which is unusual. Denies alcohol use or difficulty sleeping. Significant procrastiantion,  low motivation.    PRECAUTIONS: Fall  WEIGHT BEARING RESTRICTIONS No  PAIN:  Are you having pain? No, just some discomfort in the L eye.  Pt reports it feels like something is in it since cataract removal yesterday.  FALLS: Has patient fallen in last 6 months? No, but several stumbles; Pt reports he tends to lose his balance forward and struggles to walk down a ramp.  Pt states he's been caught by another person to prevent a fall while walking down a ramp.  LIVING  ENVIRONMENT: Lives with: lives with their spouse Lives in: Other house, 2 levels (upstairs is bonus room but pt rarely goes up there) Stairs: Yes: External: 4 steps; bilateral but cannot reach both Has following equipment at home: Single point cane, uses in community settings in a crowd  PLOF:  retired from [employer], [employer]  PATIENT GOALS "To get to where I can stand up without falling down.  Get better balance."   OBJECTIVE:  ABC Scale: 60.6% self confident in doing activity without losing balance; 04/26/22: 62.5% Berg: 43/56 (low fall risk), but significant challenge with turning to look behind, 360 turn, standing with 1 foot in front, and single leg stance Berg: 04/26/22: 52/56 Tug: 10 sec without AD (normal) 360 Turn test: 5 sec to the R (increased fall risk); Pt also attempted to turn to the L requiring close SBA d/t very wide BOS and small balance check   04/11/22: 6 MWT: 720 ft (min guard with gait belt, followed by transport chair) BP and HR WNL before/after.   04/26/22: 6 MWT: 1,060 ft supv-intermittent min guard without AD  HAND DOMINANCE: Right   MOBILITY STATUS: difficulty carrying objects with ambulation; when more fatigued, pt shuffles and has anterior lean  POSTURE COMMENTS:  Anterior lean with ambulation , erect sitting and standing Sitting balance: Moves/returns truncal midpoint >2 inches in all planes  ACTIVITY TOLERANCE: Activity tolerance: increased shuffling when fatigued  FUNCTIONAL OUTCOME MEASURES: FOTO: To be assessed next session 04/11/22: 50 04/26/22: 63.5   UPPER EXTREMITY ROM    BUE ROM WNL   UPPER EXTREMITY MMT:    BUEs grossly 5/5  HAND FUNCTION: Grip strength: Right: 50 lbs; Left: 59 lbs, Lateral pinch: Right: 17 lbs, Left: 17 lbs, and 3 point pinch: Right: 13 lbs, Left: 12 lbs  COORDINATION: Finger Nose Finger test: [facility] bilat 9 Hole Peg test: Right: 26 sec; Left: 35 sec   TODAY'S TREATMENT: Neuro re-ed:  LSVT: Patient seen  for LSVT Daily Session Maximal Daily Exercises for facilitation/coordination of movement. Maximum Sustained Movements are designed to rescale the amplitude of movement output for generalization to daily functional activities. Performed as follows for 1 set of 10 repetitions each multi-directional sustained movements. 1) Floor to ceiling (modified to avoid reach to floor; starting position with arms out, then up, then wide) 2) Side to side multidirectional: min tactile cues today to step foot back to starting position rather than slide  Repetitive movements performed in standing and are designed to provide retraining effort needed for sustained muscle activation in tasks. Performed as follows for 1 set of 10 repetitions each of multi-directional repetitive movements: 3) Step and reach forward step- min vc and visual cue for big stepping back to neutral 4) Step and reach sideways step- min tactile cue for arm position, min vc and visual cue for big stepping back to neutral  5) Step and reach backwards step- min vc for form and technique (avoided bending down on the back  swing) 6) Rock and reach forward/backward- initial min visual cue for form and technique (adapted positioning); (avoided bending down on the back swing) 7) Rock and reach sideways- close supv, min tactile cues for form/technique.  Wall stretch completed for erect posture: 3 reps of 1 min hold Big arm swings with hands touching wall behind him with each back swing for tactile/proprioceptive cue x3 sets 10 reps each, intermittent min vc for erect standing posture Cone drills: Standing marches with cone taps x3 sets 10 reps each.   Practiced 360 turns within 4 cones with cues for marching steps to turn R/L x5 reps each direction, close supv, max vc for erect posture and foot clearance Weaved in/around 4 cones x7 reps; mod vc for foot clearance and erect standing posture Stepped over cones to simulate tandem steps, max vc for foot  clearance over cones rather than stepping sideways around cones, min guard 7 reps.  Functional Component Task: 1.Sit to stand with supervision 5 reps; cued pt to avoid bending to maintain post surgery activity guidelines.   2. Buttoning/unbuttoning small buttons on a shirt: Pt completed at home today with button up shirt; reported no issues. 3. Donning/doffing socks: Pt states he forgot his dress socks today  Hierarchy Tasks 3. Carrying a full laundry basket: not completed today (pt is to avoid lifting) 4. Walking up/down a ramp: indoor with min tactile cues for erect posture and mod vc for marching steps up the ramp, mod tactile cues for erect posture walking down the ramp.  Also required cues to slow pace and increase foot clearance for better control down the ramp.  Able to complete without hand rail and close supv x3 reps. 5. Transfer into car: Not completed today  PATIENT EDUCATION: Education details: Maximal daily exercises (chair for support with rock and reach sideways) Person educated: pt Education method: explanation, demo, written handout Education comprehension: verbalized and demonstrated understanding with verbal and tactile cues   HOME EXERCISE PROGRAM: Pt given daily carryover task of maximal daily exercises in the adapted position (in standing with chair for UE support), Big walking, and seated marches.  GOALS: Goals reviewed with patient? Yes  SHORT TERM GOALS: Target date: 03/30/22  Pt will perform maximal daily exercise with min vc and handout. Baseline:  Will initiate next visit; 04/26/22: pt performs with min-mod vc (max vc for rock and reach) Goal status: ongoing  2.  Pt will turn in place (360*) <4 sec with good stability. Baseline: 5 sec, requires close supv d/t balance checks; performs with wide BOS; 04/26/22: turns each direction in 4 sec with close supv Goal status: ongoing  3.  Pt will don a tight sock (compression sock) with modified indep Baseline: Pt  reports difficulty donning "tight" socks; 04/26/22: moderately difficult Goal status: ongoing  LONG TERM GOALS: Target date: 05/11/22  Pt will increase FOTO score by 5 or more points to indicate increase in perceived functional performance with daily tasks.  Baseline: Will complete FOTO next session; 04/13/22: 50; 04/26/22: 63 Goal status: ongoing  2.  Pt will safely carry a loaded box or basket with good stability and reduced FOF. Baseline: Requires close supv-min A; 04/26/22: close supv Goal status: ongoing  3.  Pt will increase level of confidence to 70% (confidence that pt will not lose balance) when walking in a crowd where people rapidly walk by.   Baseline: 50% confident; 04/26/22: 50% Goal status: ongoing  4.  Pt will confidently walk up/down a ramp with use of  rail with modified indep. Baseline: Requires min A to amb down a ramp, close supv to amb up ramp; 04/26/22: min guard down, supv up  Goal status: ongoing  5.  Pt will increase dexterity in bilat hands to manage clothing fasteners more efficiently. Baseline: difficulty with fastening small buttons; 04/26/22: pt more consistently using finger flicks and demos good ability to manage small buttons, but reports still struggling with a bottom button on a polo shirt. Goal status: ongoing  6.  Pt will increase level of confidence to 70% (confidence that pt will not lose balance) when transferring in/out of a car. Baseline: 50% confident, 04/26/22: 80% Goal status: ongoing  ASSESSMENT: Pt had cataract sx yesterday in L eye.  Adapted exercises today to avoid bending to maintain post surgical activity guidelines.  Good tolerance to maximal daily exercises, continuing to require intermittent vc and tactile cues to initiate big steps rather than sliding feet back to starting position.  Pt improving with standing marches while tapping cones, but requires extensive cues for mobility over and around cones to increase foot clearance.  Pt is  increasing his ability to self correct when he avoids conversation, but when talking, but struggles to multi task and maintain good body mechanics with his walking.  Pt reported that he is starting to catch himself at home if he's dragging his feet and will make a correction.  Pt will continue to benefit from skilled OT for progression of maximal daily exercises, hierarchy tasks, and functional component tasks for an overall goal of calibrating movements for pt to automatically use bigger/larger amplitude movements in everyday living, as well as to improve strength and balance and decrease risk of falling.  PERFORMANCE DEFICITS in functional skills including ADLs, IADLs, coordination, dexterity, proprioception, sensation, pain, FMC, GMC, mobility, balance, body mechanics, endurance, decreased knowledge of precautions, decreased knowledge of use of DME, and vision, cognitive skills including attention, memory, safety awareness, and thought. IMPAIRMENTS are limiting patient from ADLs, IADLs, rest and sleep, leisure, and social participation.   COMORBIDITIES may have co-morbidities  that affects occupational performance. Patient will benefit from skilled OT to address above impairments and improve overall function.  MODIFICATION OR ASSISTANCE TO COMPLETE EVALUATION: Min-Moderate modification of tasks or assist with assess necessary to complete an evaluation.  OT OCCUPATIONAL PROFILE AND HISTORY: Problem focused assessment: Including review of records relating to presenting problem.  CLINICAL DECISION MAKING: Moderate - several treatment options, min-mod task modification necessary  REHAB POTENTIAL: Good  EVALUATION COMPLEXITY: Moderate    PLAN: OT FREQUENCY: 2x/week  OT DURATION: 12 weeks  PLANNED INTERVENTIONS: self care/ADL training, therapeutic exercise, therapeutic activity, neuromuscular re-education, gait training, balance training, functional mobility training, patient/family education,  cognitive remediation/compensation, energy conservation, and DME and/or AE instructions  RECOMMENDED OTHER SERVICES: N/A  CONSULTED AND AGREED WITH PLAN OF CARE: Patient and family member/caregiver  PLAN FOR NEXT SESSION: Initiate maximal daily exercises, initiate functional component tasks, and hierarchy tasks   Leta Speller, MS, OTR/L  Darleene Cleaver, OT 05/06/2022, 8:37 AM

## 2022-05-07 NOTE — Therapy (Signed)
OUTPATIENT OCCUPATIONAL THERAPY NEURO/LSVT BIG TREATMENT NOTE  Patient Name: Carlos Mathis MRN: [id number] DOB:Feb 19, 1941, 81 y.o., male Today's Date: 05/07/2022  PCP: Dr. Frazier Richards REFERRING PROVIDER: Dr. Jennings Books   OT End of Session - 05/07/22 1856     Visit Number 16    Number of Visits 17    Date for OT Re-Evaluation 05/11/22    OT Start Time 0830    OT Stop Time 0930    OT Time Calculation (min) 60 min    Activity Tolerance Patient tolerated treatment well    Behavior During Therapy WFL for tasks assessed/performed              Past Medical History:  Diagnosis Date   Arthritis    BPH (benign prostatic hyperplasia)    CKD (chronic kidney disease), stage III (HCC)    Coronary artery disease    GERD (gastroesophageal reflux disease)    History of hiatal hernia    HLD (hyperlipidemia)    Hypertension    Long term current use of antithrombotics/antiplatelets    a.) DAPT therapy (ASA+ ticagrelor)   MCI (mild cognitive impairment) with memory loss    a.) takes apoaequorin   Migraines    OSA on CPAP    Pneumonia    Skin cancer, basal cell    STEMI (ST elevation myocardial infarction) ([place]) 07/27/2020   a.) MI while living in [place]; underwent PCI placing 4.0 x 18 mm and 3.5 x 38 mm Resolute Onyx DES (vessels unknown/unspecified).   T2DM (type 2 diabetes mellitus) ([place])    Vertigo    Wears dentures    Full upper, partial lower   Wears hearing aid in both ears    Past Surgical History:  Procedure Laterality Date   CARDIAC CATHETERIZATION     CATARACT EXTRACTION Right    CATARACT EXTRACTION W/PHACO Left 05/03/2022   Procedure: CATARACT EXTRACTION PHACO AND INTRAOCULAR LENS PLACEMENT (IOC) LEFT DIABETIC;  Surgeon: Birder Robson, MD;  Location: [facility];  Service: Ophthalmology;  Laterality: Left;  14.77 1:24.0   HERNIA REPAIR Left    inguinal   INSERTION OF MESH  09/01/2021   Procedure: INSERTION OF MESH;  Surgeon: Herbert Pun, MD;  Location: [facility];  Service: General;;   JOINT REPLACEMENT     tka   UMBILICAL HERNIA REPAIR     There are no problems to display for this patient.   ONSET DATE: 01/31/22 (date of referral)  REFERRING DIAG: Parkinson's Disease  THERAPY DIAG:  Parkinson's disease with dyskinesia, unspecified whether manifestations fluctuate  Other lack of coordination  Unsteadiness on feet  Rationale for Evaluation and Treatment Rehabilitation  SUBJECTIVE:   SUBJECTIVE STATEMENT: Pt reports his wife is still driving him to therapy to let his eye rest.  Pt accompanied by: self  PERTINENT HISTORY: Per chart, Memory Loss: concerns for Lewy body dementia in a patient with episodic confusion, parkinsonism, dream enactment, hyposmia, visual hallucinations, behavioral changes. Memory loss starting 2018 with cognitive decline over 2022. Patient reports difficulty remembering conversations, difficulty recognizing old friends, getting lost while driving. Per spouse patient can become aggressive behind the wheel. Patient reports visual hallucinations (dark areas out of his periphery) and audio hallucinations. Wife assists with medications and finances. Has become more emotional over the last year and sometimes becomes angry with spouse which is unusual. Denies alcohol use or difficulty sleeping. Significant procrastiantion, low motivation.    PRECAUTIONS: Fall  WEIGHT BEARING RESTRICTIONS No  PAIN:  Are you having  pain? No, just some discomfort in the L eye.  Pt reports it feels like something is in it since cataract removal yesterday.  FALLS: Has patient fallen in last 6 months? No, but several stumbles; Pt reports he tends to lose his balance forward and struggles to walk down a ramp.  Pt states he's been caught by another person to prevent a fall while walking down a ramp.  LIVING ENVIRONMENT: Lives with: lives with their spouse Lives in: Other house, 2 levels (upstairs is bonus room but  pt rarely goes up there) Stairs: Yes: External: 4 steps; bilateral but cannot reach both Has following equipment at home: Single point cane, uses in community settings in a crowd  PLOF:  retired from [employer], [employer]  PATIENT GOALS "To get to where I can stand up without falling down.  Get better balance."   OBJECTIVE:  ABC Scale: 60.6% self confident in doing activity without losing balance; 04/26/22: 62.5% Berg: 43/56 (low fall risk), but significant challenge with turning to look behind, 360 turn, standing with 1 foot in front, and single leg stance Berg: 04/26/22: 52/56 Tug: 10 sec without AD (normal) 360 Turn test: 5 sec to the R (increased fall risk); Pt also attempted to turn to the L requiring close SBA d/t very wide BOS and small balance check   04/11/22: 6 MWT: 720 ft (min guard with gait belt, followed by transport chair) BP and HR WNL before/after.   04/26/22: 6 MWT: 1,060 ft supv-intermittent min guard without AD  HAND DOMINANCE: Right   MOBILITY STATUS: difficulty carrying objects with ambulation; when more fatigued, pt shuffles and has anterior lean  POSTURE COMMENTS:  Anterior lean with ambulation , erect sitting and standing Sitting balance: Moves/returns truncal midpoint >2 inches in all planes  ACTIVITY TOLERANCE: Activity tolerance: increased shuffling when fatigued  FUNCTIONAL OUTCOME MEASURES: FOTO: To be assessed next session 04/11/22: 50 04/26/22: 63.5   UPPER EXTREMITY ROM    BUE ROM WNL   UPPER EXTREMITY MMT:    BUEs grossly 5/5  HAND FUNCTION: Grip strength: Right: 50 lbs; Left: 59 lbs, Lateral pinch: Right: 17 lbs, Left: 17 lbs, and 3 point pinch: Right: 13 lbs, Left: 12 lbs  COORDINATION: Finger Nose Finger test: [facility] bilat 9 Hole Peg test: Right: 26 sec; Left: 35 sec   TODAY'S TREATMENT: Neuro re-ed:  LSVT: Patient seen for LSVT Daily Session Maximal Daily Exercises for facilitation/coordination of movement. Maximum Sustained  Movements are designed to rescale the amplitude of movement output for generalization to daily functional activities. Performed as follows for 1 set of 10 repetitions each multi-directional sustained movements. 1) Floor to ceiling (modified to avoid reach to floor; starting position with arms out, then up, then wide) 2) Side to side multidirectional: min tactile cues today to step foot back to starting position rather than slide  Repetitive movements performed in standing and are designed to provide retraining effort needed for sustained muscle activation in tasks. Performed as follows for 1 set of 10 repetitions each of multi-directional repetitive movements: 3) Step and reach forward step- min vc and visual cue for big stepping back to neutral 4) Step and reach sideways step- min tactile cue for arm position, min vc and visual cue for big stepping back to neutral  5) Step and reach backwards step- min vc for form and technique (avoided bending down on the back swing) 6) Rock and reach forward/backward- initial min visual cue for form and technique (adapted positioning); (avoided bending  down on the back swing) 7) Rock and reach sideways- close supv, min tactile cues for form/technique.  Wall stretch completed for erect posture: 3 reps of 1 min hold Big arm swings with hands touching wall behind him with each back swing for tactile/proprioceptive cue x3 sets 10 reps each, intermittent min vc for erect standing posture Cone drills: Standing marches with cone taps x3 sets 10 reps each.   Practiced 360 turns within 4 cones with cues for marching steps to turn R/L x5 reps each direction, close supv, max vc for erect posture and foot clearance Weaved in/around 4 cones x7 reps; mod vc for foot clearance and erect standing posture Stepped over cones to simulate tandem steps, max vc for foot clearance over cones rather than stepping sideways around cones, min guard 7 reps.  Functional Component  Task: 1.Sit to stand with supervision 5 reps; cued pt to avoid bending to maintain post surgery activity guidelines.   2. Buttoning/unbuttoning small buttons on a shirt: Pt completed at home today with button up shirt; reported no issues. 3. Donning/doffing socks: Pt able to don/doff dress socks today with figure 4 position on edge of mat table.  Pt reported he has some smaller ones at home that are more challenging to don.  Encouraged pt to buy larger size for easier donning.  Pt agreed as he states they leave a mark on his leg as they are too tight.  Hierarchy Tasks 3. Carrying a full laundry basket: not completed today (pt is to avoid lifting) 4. Walking up/down a ramp: indoor with min tactile cues for erect posture and mod vc for marching steps up the ramp, mod tactile cues for erect posture walking down the ramp.  Also required cues to slow pace and increase foot clearance for better control down the ramp.  Able to complete without hand rail and close supv x3 reps. 5. Transfer into car: Not completed today  PATIENT EDUCATION: Education details: Maximal daily exercises (chair for support with rock and reach sideways) Person educated: pt Education method: explanation, demo, written handout Education comprehension: verbalized and demonstrated understanding with verbal and tactile cues   HOME EXERCISE PROGRAM: Pt given daily carryover task of maximal daily exercises in the adapted position (in standing with chair for UE support), Big walking, and seated marches.  GOALS: Goals reviewed with patient? Yes  SHORT TERM GOALS: Target date: 03/30/22  Pt will perform maximal daily exercise with min vc and handout. Baseline:  Will initiate next visit; 04/26/22: pt performs with min-mod vc (max vc for rock and reach) Goal status: ongoing  2.  Pt will turn in place (360*) <4 sec with good stability. Baseline: 5 sec, requires close supv d/t balance checks; performs with wide BOS; 04/26/22: turns  each direction in 4 sec with close supv Goal status: ongoing  3.  Pt will don a tight sock (compression sock) with modified indep Baseline: Pt reports difficulty donning "tight" socks; 04/26/22: moderately difficult Goal status: ongoing  LONG TERM GOALS: Target date: 05/11/22  Pt will increase FOTO score by 5 or more points to indicate increase in perceived functional performance with daily tasks.  Baseline: Will complete FOTO next session; 04/13/22: 50; 04/26/22: 63 Goal status: ongoing  2.  Pt will safely carry a loaded box or basket with good stability and reduced FOF. Baseline: Requires close supv-min A; 04/26/22: close supv Goal status: ongoing  3.  Pt will increase level of confidence to 70% (confidence that pt will not lose  balance) when walking in a crowd where people rapidly walk by.   Baseline: 50% confident; 04/26/22: 50% Goal status: ongoing  4.  Pt will confidently walk up/down a ramp with use of rail with modified indep. Baseline: Requires min A to amb down a ramp, close supv to amb up ramp; 04/26/22: min guard down, supv up  Goal status: ongoing  5.  Pt will increase dexterity in bilat hands to manage clothing fasteners more efficiently. Baseline: difficulty with fastening small buttons; 04/26/22: pt more consistently using finger flicks and demos good ability to manage small buttons, but reports still struggling with a bottom button on a polo shirt. Goal status: ongoing  6.  Pt will increase level of confidence to 70% (confidence that pt will not lose balance) when transferring in/out of a car. Baseline: 50% confident, 04/26/22: 80% Goal status: ongoing  ASSESSMENT: Continued to adapt exercises today to avoid bending to maintain post surgical activity guidelines.  Good tolerance to maximal daily exercises, continuing to require intermittent vc and tactile cues to initiate big steps rather than sliding feet back to starting position.  Pt is increasing his ability to self  correct when he avoids conversation, but when talking, but struggles to multi task and maintain good body mechanics with his walking.  Good ability to don dress socks today.  Pt plans to buy larger size to replace the tighter socks that give him trouble at home.  Planning discharge assessment tomorrow which will complete pt's 4 weeks of LSVT program.  Pt plans to transition to a [employer] next week.    PERFORMANCE DEFICITS in functional skills including ADLs, IADLs, coordination, dexterity, proprioception, sensation, pain, FMC, GMC, mobility, balance, body mechanics, endurance, decreased knowledge of precautions, decreased knowledge of use of DME, and vision, cognitive skills including attention, memory, safety awareness, and thought. IMPAIRMENTS are limiting patient from ADLs, IADLs, rest and sleep, leisure, and social participation.   COMORBIDITIES may have co-morbidities  that affects occupational performance. Patient will benefit from skilled OT to address above impairments and improve overall function.  MODIFICATION OR ASSISTANCE TO COMPLETE EVALUATION: Min-Moderate modification of tasks or assist with assess necessary to complete an evaluation.  OT OCCUPATIONAL PROFILE AND HISTORY: Problem focused assessment: Including review of records relating to presenting problem.  CLINICAL DECISION MAKING: Moderate - several treatment options, min-mod task modification necessary  REHAB POTENTIAL: Good  EVALUATION COMPLEXITY: Moderate    PLAN: OT FREQUENCY: 2x/week  OT DURATION: 12 weeks  PLANNED INTERVENTIONS: self care/ADL training, therapeutic exercise, therapeutic activity, neuromuscular re-education, gait training, balance training, functional mobility training, patient/family education, cognitive remediation/compensation, energy conservation, and DME and/or AE instructions  RECOMMENDED OTHER SERVICES: N/A  CONSULTED AND AGREED WITH PLAN OF CARE: Patient and family  member/caregiver  PLAN FOR NEXT SESSION: Initiate maximal daily exercises, initiate functional component tasks, and hierarchy tasks   Leta Speller, MS, OTR/L  Darleene Cleaver, OT 05/07/2022, 6:59 PM

## 2022-06-03 IMAGING — CR DG CHEST 2V
1 series · 2 of 2 positions shown · non-contrast
Comparison: None.

CLINICAL DATA: Cough and congestion

EXAM:
CHEST - 2 VIEW

[Series 1: dg chest 2 view · 0.14mm/px · 2 of 2 slices shown]
[im 1/2]
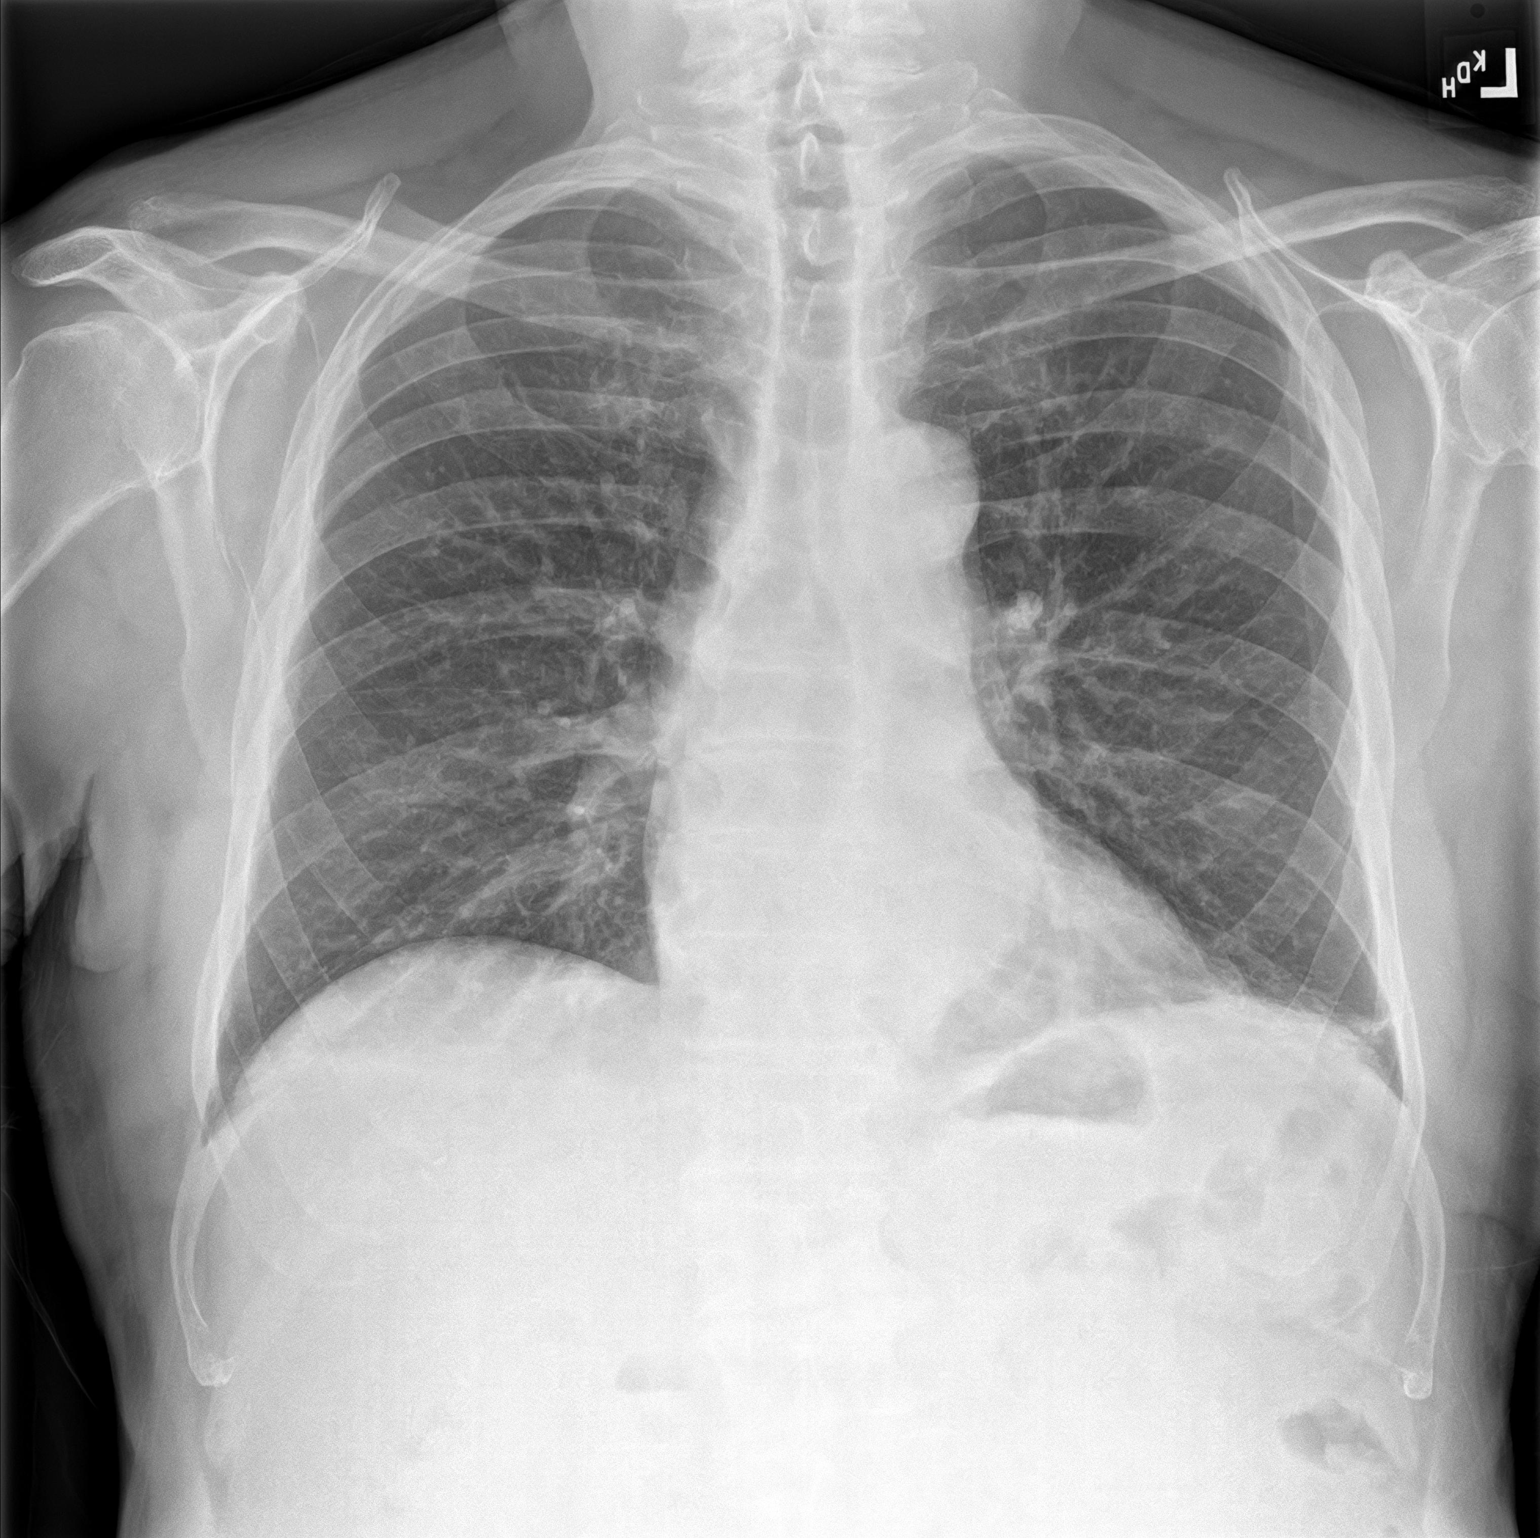
[im 2/2]
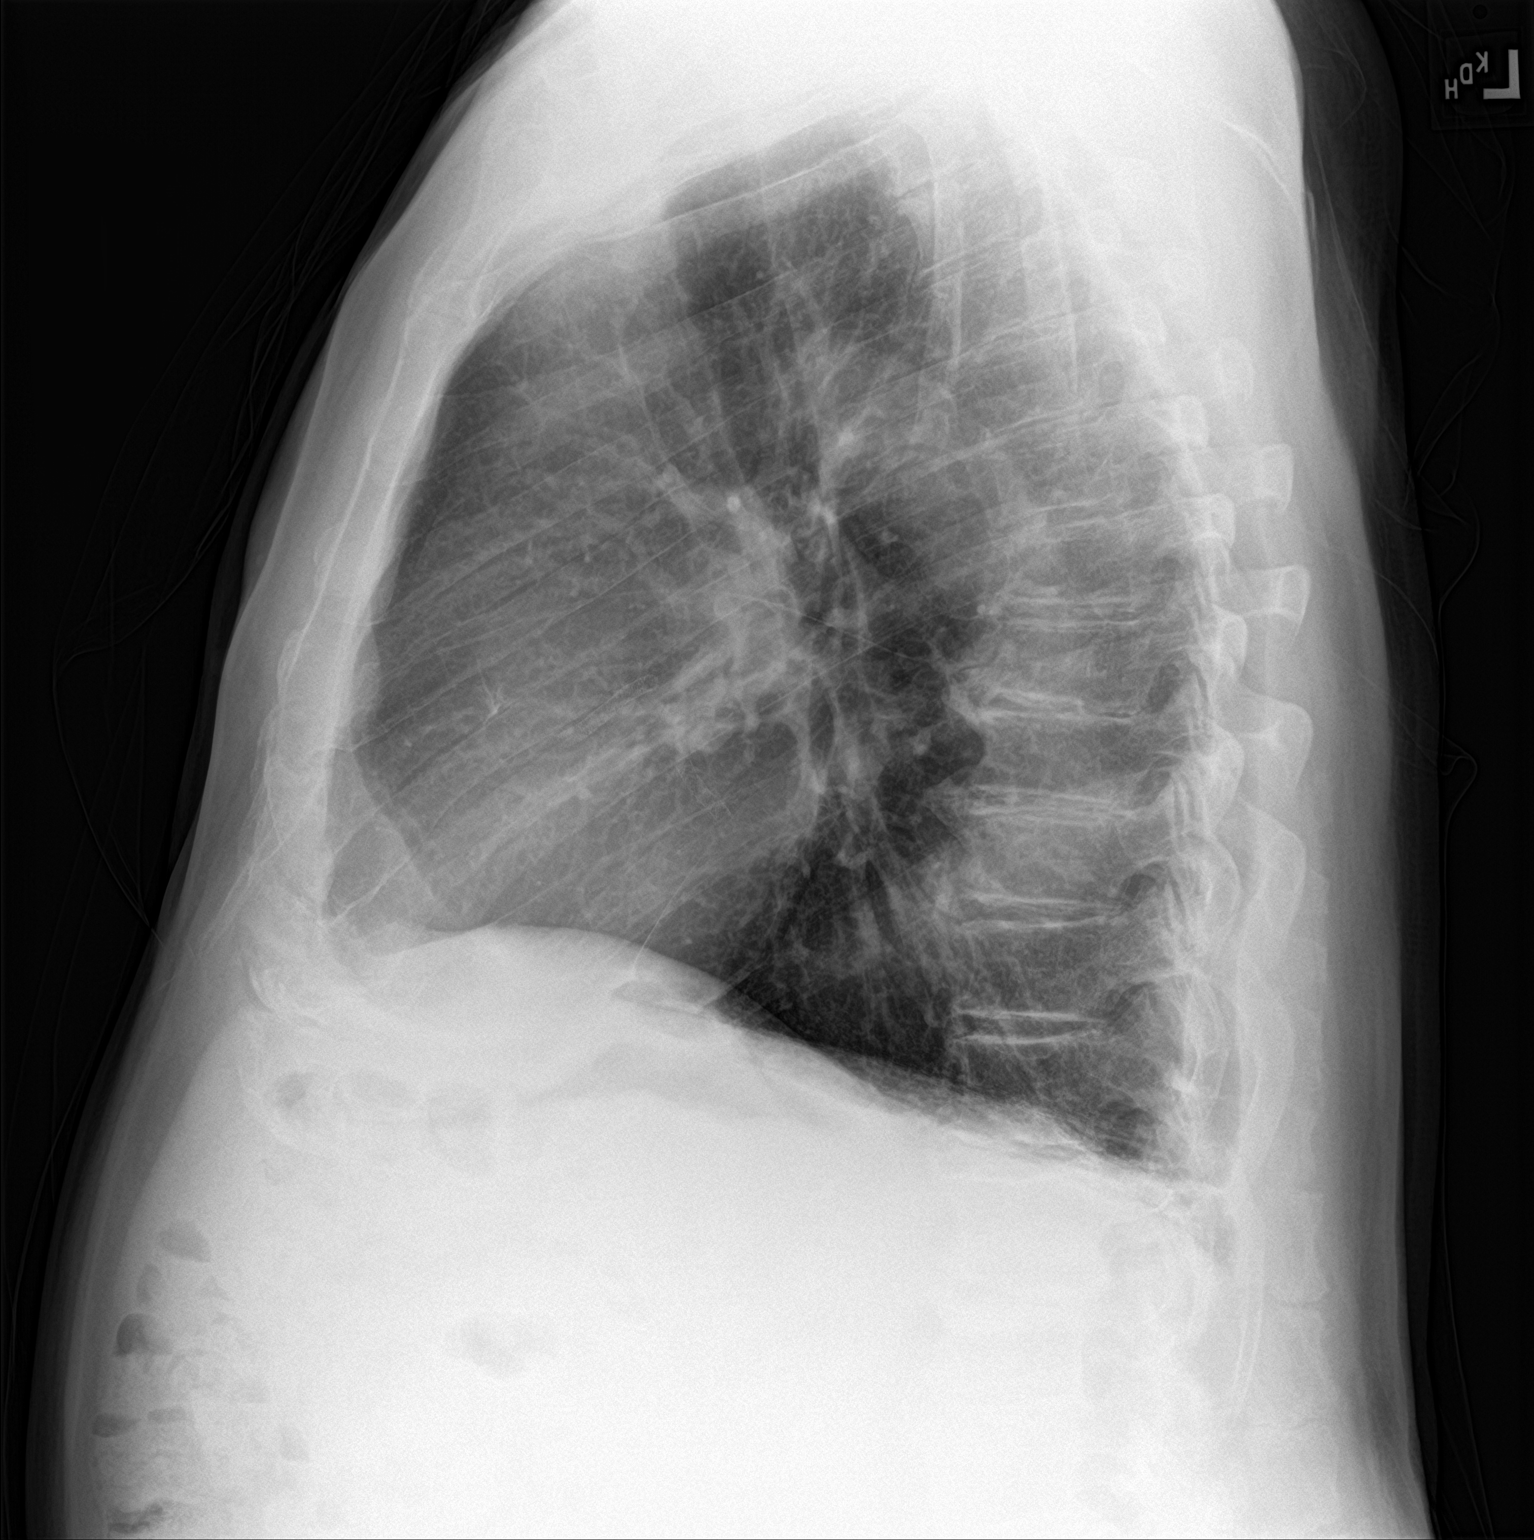

[2 of 2 positions shown; findings below may reference images not displayed]

FINDINGS: There is atelectatic change in the left base. The lungs otherwise
are clear. The heart size and pulmonary vascularity are normal. No
adenopathy. There is degenerative change in the thoracic spine.
IMPRESSION: Left base atelectasis.  Lungs elsewhere clear.  Heart size normal.

## 2022-09-02 ENCOUNTER — Encounter: Payer: Self-pay | Admitting: Unknown Physician Specialty

## 2022-09-02 DIAGNOSIS — T17908A Unspecified foreign body in respiratory tract, part unspecified causing other injury, initial encounter: Secondary | ICD-10-CM

## 2022-09-09 ENCOUNTER — Other Ambulatory Visit: Payer: Self-pay | Admitting: Otolaryngology

## 2022-09-09 DIAGNOSIS — T17928A Food in respiratory tract, part unspecified causing other injury, initial encounter: Secondary | ICD-10-CM

## 2022-09-09 DIAGNOSIS — R633 Feeding difficulties, unspecified: Secondary | ICD-10-CM

## 2022-09-09 DIAGNOSIS — R1310 Dysphagia, unspecified: Secondary | ICD-10-CM

## 2022-09-20 ENCOUNTER — Other Ambulatory Visit: Payer: Self-pay | Admitting: Otolaryngology

## 2022-09-20 DIAGNOSIS — R1310 Dysphagia, unspecified: Secondary | ICD-10-CM

## 2022-09-20 DIAGNOSIS — R633 Feeding difficulties, unspecified: Secondary | ICD-10-CM

## 2022-09-20 DIAGNOSIS — W44F3XA Food entering into or through a natural orifice, initial encounter: Secondary | ICD-10-CM

## 2022-09-29 ENCOUNTER — Ambulatory Visit
Admission: RE | Admit: 2022-09-29 | Discharge: 2022-09-29 | Disposition: A | Discharge status: Home / Self Care | Payer: Medicare Other | Source: Ambulatory Visit | Attending: Otolaryngology | Admitting: Otolaryngology

## 2022-09-29 DIAGNOSIS — I129 Hypertensive chronic kidney disease with stage 1 through stage 4 chronic kidney disease, or unspecified chronic kidney disease: Secondary | ICD-10-CM | POA: Diagnosis not present

## 2022-09-29 DIAGNOSIS — R0989 Other specified symptoms and signs involving the circulatory and respiratory systems: Secondary | ICD-10-CM | POA: Insufficient documentation

## 2022-09-29 DIAGNOSIS — I252 Old myocardial infarction: Secondary | ICD-10-CM | POA: Insufficient documentation

## 2022-09-29 DIAGNOSIS — G4733 Obstructive sleep apnea (adult) (pediatric): Secondary | ICD-10-CM | POA: Diagnosis not present

## 2022-09-29 DIAGNOSIS — R1312 Dysphagia, oropharyngeal phase: Secondary | ICD-10-CM | POA: Insufficient documentation

## 2022-09-29 DIAGNOSIS — W44F3XA Food entering into or through a natural orifice, initial encounter: Secondary | ICD-10-CM | POA: Diagnosis not present

## 2022-09-29 DIAGNOSIS — E785 Hyperlipidemia, unspecified: Secondary | ICD-10-CM | POA: Insufficient documentation

## 2022-09-29 DIAGNOSIS — N400 Enlarged prostate without lower urinary tract symptoms: Secondary | ICD-10-CM | POA: Diagnosis not present

## 2022-09-29 DIAGNOSIS — R633 Feeding difficulties, unspecified: Secondary | ICD-10-CM | POA: Diagnosis present

## 2022-09-29 DIAGNOSIS — R1310 Dysphagia, unspecified: Secondary | ICD-10-CM

## 2022-09-29 DIAGNOSIS — I2510 Atherosclerotic heart disease of native coronary artery without angina pectoris: Secondary | ICD-10-CM | POA: Diagnosis not present

## 2022-09-29 DIAGNOSIS — Z7902 Long term (current) use of antithrombotics/antiplatelets: Secondary | ICD-10-CM | POA: Diagnosis not present

## 2022-09-29 DIAGNOSIS — T17928A Food in respiratory tract, part unspecified causing other injury, initial encounter: Secondary | ICD-10-CM | POA: Diagnosis present

## 2022-09-29 DIAGNOSIS — G20A1 Parkinson's disease without dyskinesia, without mention of fluctuations: Secondary | ICD-10-CM | POA: Insufficient documentation

## 2022-09-29 DIAGNOSIS — E1122 Type 2 diabetes mellitus with diabetic chronic kidney disease: Secondary | ICD-10-CM | POA: Diagnosis not present

## 2022-09-29 DIAGNOSIS — K219 Gastro-esophageal reflux disease without esophagitis: Secondary | ICD-10-CM | POA: Diagnosis not present

## 2022-09-29 NOTE — Progress Notes (Signed)
Modified Barium Swallow Study  Patient Details  Name: Carlos Mathis MRN: [id number] Date of Birth: Feb 20, 1941  Today's Date: 09/29/2022  Modified Barium Swallow completed.  Full report located under Chart Review in the Imaging Section.  History of Present Illness Pt is a 81yo male w/ past medical history including MCI(mild Cognitive decline), Hiatal Hernia, GERD, Denture wear, hyperlipidemia, coronary disease, CKD, CAD, STEMI, BPH.  Pt has a recent dx of Parkinson's Dis and is being followed by OT/Boxing Program addressing movement. Pt's vocal quality was strong and speech clear in conversation -- pt a frequent talker.  Pt eats a regular diet at home; denies any recent weight loss. Pt denied any tx for pneumonia in 6-12 months.  Pt's complaint was getting "choked on my own saliva sometimes". OM exam revealed no lingual fasciculations; no OM weakness/asymmetry. Upper Denture plate, lower partial.    Clinical Impression Pt presents for MBSS today. Pt reports inconsistent episodes of "choking on my saliva"; he could not describe any consistent/current episodes of choking when drinking liquids or eating foods. Pt has Baseline dxs of GERD and Hiatal Hernia as well as MCI and recently dx'd Parkinson's Dis. -- pt stated he is NOT taking medication for the Parkinson's Dis.   During OM exam, no lingual fasciculations; no OM weakness/asymmetry. Upper Denture plate, lower partial.   Patient presents with functional oropharyngeal swallowing w/ No oropharyngeal phase dysphagia noted during this exam; no sensorimotor deficits. No laryngeal penetration nor aspiration occurred during this study.     Oral stage is characterized by appropriate lip closure, bolus preparation and containment, and timely anterior to posterior transit. Swallow initiation occurs at the level of the BOT-valleculae w/ trial consistencies.      Pharyngeal stage is noted for functional tongue base retraction, hyolaryngeal excursion, and  appropriate pharyngeal constriction. Epiglottic deflection is complete; there is No laryngeal penetration nor aspiration. No pharyngeal residue remaining post swallows. Pharyngeal stripping wave is complete.      Amplitude/duration of cricopharyngeus opening is WFL. There is adequate/complete clearance through the cervical esophagus. Of Note: apparent "bumpy" presentation along the posterior distal, cervical esophageal wall from potential slight osteophytes; this did not impede bolus flow and clearance.  Barium tablet was not tested -- pt stated he could swallow "a handful" at a time. Education provided on the close proximity of the airway entrance and the esophagus when swallowing; when saliva collects in the pharynx possibly and its close proximity to the airway entrance also. Encouraged him to be more aware of when the incidents occur and if he is distracted. Also, alert his PCP if he notes any increased saliva he feels he cannot control/manage w/ swallowing.        Consistencies tested were thin liquids x2 tsps, 1 cup sip, 3 sequential sips, nectar x1 tsp, 1 cup sip, 2 sequential cup sips, honey x1 tsp, pudding x1 tsp, regular solid (1/2 graham cracker with pudding) x1.        Recommend patient continue a regular diet with meats/foods cut small and foods moistened well; thin liquids. Reduce talking and distractions during meals/eating and drinking. Encouraged general aspiration and REFLUX precautions. Factors that may increase risk of adverse event in presence of aspiration Carlos Mathis & Clearance Carlos Mathis 2021):  (none)  Swallow Evaluation Recommendations Recommendations: PO diet PO Diet Recommendation: Regular;Thin liquids (Level 0) Liquid Administration via: Cup;Straw Medication Administration: Whole meds with liquid (as tolerates) Supervision: Patient able to self-feed Swallowing strategies  : Minimize environmental distractions;Slow rate;Small bites/sips Postural changes: Position pt  fully upright for  meals;Stay upright 30-60 min after meals (GERD precautions) Oral care recommendations: Oral care BID (2x/day);Pt independent with oral care Recommended consults:  (continue to follow w/ Neurology for Parkinson's Dis.)         Jerilynn Som, MS, CCC-SLP Speech Language Pathologist Rehab Services; [facility] [phone] (ascom) Carlos Mathis 09/29/2022,6:14 PM

## 2022-11-16 ENCOUNTER — Encounter: Payer: Self-pay | Admitting: Podiatry

## 2022-11-16 ENCOUNTER — Ambulatory Visit (INDEPENDENT_AMBULATORY_CARE_PROVIDER_SITE_OTHER): Payer: Medicare Other | Admitting: Podiatry

## 2022-11-16 DIAGNOSIS — M179 Osteoarthritis of knee, unspecified: Secondary | ICD-10-CM | POA: Insufficient documentation

## 2022-11-16 DIAGNOSIS — E1142 Type 2 diabetes mellitus with diabetic polyneuropathy: Secondary | ICD-10-CM | POA: Diagnosis not present

## 2022-11-16 DIAGNOSIS — Z8611 Personal history of tuberculosis: Secondary | ICD-10-CM | POA: Insufficient documentation

## 2022-11-16 DIAGNOSIS — Z8679 Personal history of other diseases of the circulatory system: Secondary | ICD-10-CM | POA: Insufficient documentation

## 2022-11-16 DIAGNOSIS — J329 Chronic sinusitis, unspecified: Secondary | ICD-10-CM | POA: Insufficient documentation

## 2022-11-16 DIAGNOSIS — I219 Acute myocardial infarction, unspecified: Secondary | ICD-10-CM | POA: Insufficient documentation

## 2022-11-16 DIAGNOSIS — E119 Type 2 diabetes mellitus without complications: Secondary | ICD-10-CM | POA: Insufficient documentation

## 2022-11-16 DIAGNOSIS — F03A0 Unspecified dementia, mild, without behavioral disturbance, psychotic disturbance, mood disturbance, and anxiety: Secondary | ICD-10-CM | POA: Insufficient documentation

## 2022-11-16 DIAGNOSIS — Z719 Counseling, unspecified: Secondary | ICD-10-CM | POA: Insufficient documentation

## 2022-11-16 DIAGNOSIS — H8110 Benign paroxysmal vertigo, unspecified ear: Secondary | ICD-10-CM | POA: Insufficient documentation

## 2022-11-16 DIAGNOSIS — I129 Hypertensive chronic kidney disease with stage 1 through stage 4 chronic kidney disease, or unspecified chronic kidney disease: Secondary | ICD-10-CM | POA: Insufficient documentation

## 2022-11-16 DIAGNOSIS — N471 Phimosis: Secondary | ICD-10-CM | POA: Insufficient documentation

## 2022-11-16 DIAGNOSIS — I259 Chronic ischemic heart disease, unspecified: Secondary | ICD-10-CM | POA: Insufficient documentation

## 2022-11-16 DIAGNOSIS — F411 Generalized anxiety disorder: Secondary | ICD-10-CM | POA: Insufficient documentation

## 2022-11-16 DIAGNOSIS — G4733 Obstructive sleep apnea (adult) (pediatric): Secondary | ICD-10-CM | POA: Insufficient documentation

## 2022-11-16 DIAGNOSIS — R519 Headache, unspecified: Secondary | ICD-10-CM | POA: Insufficient documentation

## 2022-11-16 DIAGNOSIS — K589 Irritable bowel syndrome without diarrhea: Secondary | ICD-10-CM | POA: Insufficient documentation

## 2022-11-16 DIAGNOSIS — N481 Balanitis: Secondary | ICD-10-CM | POA: Insufficient documentation

## 2022-11-16 DIAGNOSIS — K219 Gastro-esophageal reflux disease without esophagitis: Secondary | ICD-10-CM | POA: Insufficient documentation

## 2022-11-16 DIAGNOSIS — I499 Cardiac arrhythmia, unspecified: Secondary | ICD-10-CM | POA: Insufficient documentation

## 2022-11-16 DIAGNOSIS — H903 Sensorineural hearing loss, bilateral: Secondary | ICD-10-CM | POA: Insufficient documentation

## 2022-11-16 DIAGNOSIS — G2581 Restless legs syndrome: Secondary | ICD-10-CM | POA: Insufficient documentation

## 2022-11-18 NOTE — Progress Notes (Signed)
  Subjective:  Patient ID: Carlos Mathis, male    DOB: 1941-04-08,  MRN: [id number]  Chief Complaint  Patient presents with   Peripheral Neuropathy    [facility] and numbess in legs both feet pt has neuropathy   Diabetes    Diabetic foot care    82 y.o. male presents with the above complaint. History confirmed with patient.  Previously on gabapentin for this he has burning tingling pain.  Gabapentin gave him too many side effects.  Objective:  Physical Exam: warm, good capillary refill, no trophic changes or ulcerative lesions, normal DP and PT pulses, and diffuse polyneuropathy with loss of protective sensation distally to the midfoot  Assessment:   1. Diabetic polyneuropathy associated with type 2 diabetes mellitus (HCC)   2. Encounter for diabetic foot exam [facility])      Plan:  Patient was evaluated and treated and all questions answered.  Patient educated on diabetes. Discussed proper diabetic foot care and discussed risks and complications of disease. Educated patient in depth on reasons to return to the office immediately should he/she discover anything concerning or new on the feet. All questions answered. Discussed proper shoes as well.   We discussed treatment of his polyneuropathy.  Too many side effects.  He is not interested in trying Lyrica either due to this.  We discussed using OTC topical medications.  He will return as needed at this has further issues I advised him to inspect his feet daily avoid going barefoot and notify me if any ulcerations develop  Return if symptoms worsen or fail to improve.

## 2023-01-25 ENCOUNTER — Telehealth: Payer: Self-pay | Admitting: Gastroenterology

## 2023-01-25 NOTE — Telephone Encounter (Signed)
Patient and his wife [name]) called in because they was worried about the weather. I sent her the Mychart link for her to setup her. They want to change the office to an Mychart visit.

## 2023-01-26 ENCOUNTER — Encounter (INDEPENDENT_AMBULATORY_CARE_PROVIDER_SITE_OTHER): Payer: Medicare Other | Admitting: Gastroenterology

## 2023-01-26 NOTE — Progress Notes (Signed)
[name]  [address]  [address]  [place], [place] [address]  Main: [phone]  Fax: [phone]   Gastroenterology Consultation  Referring Provider:     Lauro Regulus, MD Primary Care Physician:  Lauro Regulus, MD Reason for Consultation:     aspiration        HPI:   Virtual Visit via video  Note  I connected with patient on 01/26/23 at  3:00 PM EDT by video  and verified that I am speaking with the correct person using two identifiers.   I discussed the limitations, risks, security and privacy concerns of performing an evaluation and management service by video and the availability of in person appointments. I also discussed with the patient that there may be a patient responsible charge related to this service. The patient expressed understanding and agreed to proceed.  Location of the patient: Home Location of provider: Home Participating persons: Patient and provider only   History of Present Illness: No chief complaint on file.    Carlos Mathis is a 82 y.o. y/o male referred for aspiration.  History of choking on saliva  09/29/2022 modified barium swallow history of Parkinson's disease normal swallow function no laryngeal penetration or tracheal aspiration.  History of cognitive decline over 2022 exposed to agent orange in [place]  Past Medical History:  Diagnosis Date   Arthritis    BPH (benign prostatic hyperplasia)    CKD (chronic kidney disease), stage III (HCC)    Coronary artery disease    GERD (gastroesophageal reflux disease)    History of hiatal hernia    HLD (hyperlipidemia)    Hypertension    Long term current use of antithrombotics/antiplatelets    a.) DAPT therapy (ASA+ ticagrelor)   MCI (mild cognitive impairment) with memory loss    a.) takes apoaequorin   Migraines    OSA on CPAP    Pneumonia    Skin cancer, basal cell    STEMI (ST elevation myocardial infarction) (HCC) 07/27/2020   a.) MI while living in [place];  underwent PCI placing 4.0 x 18 mm and 3.5 x 38 mm Resolute Onyx DES (vessels unknown/unspecified).   T2DM (type 2 diabetes mellitus) (HCC)    Vertigo    Wears dentures    Full upper, partial lower   Wears hearing aid in both ears     Past Surgical History:  Procedure Laterality Date   CARDIAC CATHETERIZATION     CATARACT EXTRACTION Right    CATARACT EXTRACTION W/PHACO Left 05/03/2022   Procedure: CATARACT EXTRACTION PHACO AND INTRAOCULAR LENS PLACEMENT (IOC) LEFT DIABETIC;  Surgeon: Galen Manila, MD;  Location: [facility];  Service: Ophthalmology;  Laterality: Left;  14.77 1:24.0   HERNIA REPAIR Left    inguinal   INSERTION OF MESH  09/01/2021   Procedure: INSERTION OF MESH;  Surgeon: Carolan Shiver, MD;  Location: [facility];  Service: General;;   JOINT REPLACEMENT     tka   UMBILICAL HERNIA REPAIR      Prior to Admission medications   Medication Sig Start Date End Date Taking? Authorizing Provider  aspirin 81 MG EC tablet Take 81 mg by mouth daily. 10/06/04   [provider]  atorvastatin (LIPITOR) 40 MG tablet Take 40 mg by mouth every evening. 07/09/19   [provider]  colchicine 0.6 MG tablet 1.2mg  x 1 then 0.6mg  1 hour later x 1. Total 1.8mg  dose / attack. 07/23/20   [provider]  diclofenac (VOLTAREN)  75 MG EC tablet     [provider]  donepezil (ARICEPT ODT) 10 MG disintegrating tablet Take 5 mg by mouth at bedtime.    [provider]  empagliflozin (JARDIANCE) 25 MG TABS tablet Take 12.5 mg by mouth every morning.    [provider]  gentamicin ointment (GARAMYCIN) 0.1 % SMARTSIG:1 Topical Daily 09/01/22   [provider]  glipiZIDE (GLUCOTROL) 10 MG tablet     [provider]  glucose blood ([employer] BLOOD GLUCOSE TEST) test strip USE 1 STRIP FOR TESTING AS DIRECTED EVERY DAY TO TEST BLOOD SUGAR 05/18/20   [provider]  glucose blood test strip USE 1 STRIP FOR TESTING AS  DIRECTED EVERY DAY TO TEST BLOOD SUGAR 05/18/20   [provider]  hydrochlorothiazide (HYDRODIURIL) 12.5 MG tablet     [provider]  lidocaine (LIDODERM) 5 % Place 1 patch onto the skin daily. Remove & Discard patch within 12 hours or as directed by MD Patient not taking: Reported on 04/26/2022 04/09/22   Georga Hacking, MD  losartan (COZAAR) 25 MG tablet Take 25 mg by mouth every evening. 08/13/20   [provider]  meclizine (ANTIVERT) 25 MG tablet Take 25 mg by mouth 3 (three) times daily as needed for dizziness.    [provider]  meloxicam (MOBIC) 7.5 MG tablet Take by mouth. 07/23/20   [provider]  metFORMIN (GLUMETZA) 500 MG (MOD) 24 hr tablet Take 500 mg by mouth every evening.    [provider]  metoprolol succinate (TOPROL-XL) 50 MG 24 hr tablet 25 mg every morning. 01/17/06   [provider]  mirabegron ER (MYRBETRIQ) 25 MG TB24 tablet TAKE ONE TABLET BY MOUTH EVERY DAY FOR OVERACTIVE BLADDER 10/10/22   [provider]  mometasone (ELOCON) 0.1 % lotion Apply topically. 09/01/22   [provider]  Multiple Vitamin (MULTIVITAMIN) capsule Take 1 capsule by mouth daily.    [provider]  nitroGLYCERIN (NITROSTAT) 0.4 MG SL tablet Place 0.4 mg under the tongue every 5 (five) minutes as needed for chest pain. 08/13/20   [provider]  Omega-3 Fatty Acids (OMEGA-3 FISH OIL) 1200 MG CAPS Take 1,200 mg by mouth daily.    [provider]  omeprazole (PRILOSEC) 40 MG capsule Take 1 capsule by mouth daily. 12/07/15   [provider]  OVER THE COUNTER MEDICATION Take 1 capsule by mouth daily. Nerve Control 911    [provider]  rivastigmine (EXELON) 1.5 MG capsule Take 1.5 mg by mouth 2 (two) times daily. Patient not taking: Reported on 04/26/2022    [provider]  tamsulosin (FLOMAX) 0.4 MG CAPS capsule Take 0.4 mg by mouth every evening.    [provider]  triamcinolone cream (KENALOG) 0.1 % APPLY MODERATE AMOUNT TOPICALLY TWO TIMES A DAY AS NEEDED TO FORESKIN. REPLACES BETAMETHASONE 09/20/22   [provider]    No family history on file.   Social History   Tobacco Use   Smoking status: Former    Current packs/day: 0.00    Average packs/day: 1 pack/day for 20.0 years (20.0 ttl pk-yrs)    Types: Cigarettes    Start date: 09/11/1963    Quit date: 09/11/1983    Years since quitting: 39.4   Smokeless tobacco: Former    Types: Chew    Quit date: 09/11/1983  Vaping Use   Vaping status: Never Used  Substance Use Topics   Alcohol use: Not Currently  Drug use: Not Currently    Allergies as of 01/26/2023 - Review Complete 11/16/2022  Allergen Reaction Noted   Meperidine hcl Nausea Only 08/14/2008    Review of Systems:    All systems reviewed and negative except where noted in HPI. General Appearance:    Alert, cooperative, no distress, appears stated age  Head:    Normocephalic, without obvious abnormality, atraumatic  Eyes:    PERRL, conjunctiva/corneas clear,  Ears:    Grossly normal hearing    Neurologic:   Grossly appears normal     Observations/Objective:  Labs: CBC    Component Value Date/Time   WBC 7.0 07/31/2021 0936   RBC 4.87 07/31/2021 0936   HGB 16.0 07/31/2021 0936   HCT 48.4 07/31/2021 0936   PLT 172 07/31/2021 0936   MCV 99.4 07/31/2021 0936   MCH 32.9 07/31/2021 0936   MCHC 33.1 07/31/2021 0936   RDW 12.2 07/31/2021 0936   CMP     Component Value Date/Time   NA 139 07/31/2021 0936   K 4.3 07/31/2021 0936   CL 107 07/31/2021 0936   CO2 26 07/31/2021 0936   GLUCOSE 154 (H) 07/31/2021 0936   BUN 20 07/31/2021 0936   CREATININE 1.06 07/31/2021 0936   CALCIUM 9.3 07/31/2021 0936   GFRNONAA >60 07/31/2021 0936    Imaging Studies: No results found.  Assessment and Plan:   Carlos Mathis is a 82 y.o. y/o male has been referred for ***    Plan :   ***    I discussed  the assessment and treatment plan with the patient. The patient was provided an opportunity to ask questions and all were answered. The patient agreed with the plan and demonstrated an understanding of the instructions.   The patient was advised to call back or seek an in-person evaluation if the symptoms worsen or if the condition fails to improve as anticipated.  I provided *** minutes of face-to-face time during this encounter.   Dr [name] MD,MRCP [facility]) Gastroenterology/Hepatology Pager: [phone]   Speech recognition software was used to dictate the above note.

## 2023-01-27 ENCOUNTER — Telehealth: Payer: Medicare Other | Admitting: Gastroenterology

## 2023-01-27 DIAGNOSIS — R1319 Other dysphagia: Secondary | ICD-10-CM

## 2023-01-27 NOTE — Progress Notes (Signed)
Carlos Mathis  [address]  [address]  [place], [place] [address]  Main: [phone]  Fax: [phone]   Gastroenterology Consultation  Referring Provider:     Lauro Regulus, MD Primary Care Physician:  Carlos Regulus, MD Reason for Consultation:     dysphagia         HPI:   Virtual Visit via video  Note  I connected with patient on 01/27/23 at  1:30 PM EDT by video  and verified that I am speaking with the correct person using two identifiers.   I discussed the limitations, risks, security and privacy concerns of performing an evaluation and management service by video and the availability of in person appointments. I also discussed with the patient that there may be a patient responsible charge related to this service. The patient expressed understanding and agreed to proceed.  Location of the patient: Home Location of provider: Home Participating persons: Patient and provider only   History of Present Illness: Chief Complaint  Patient presents with   Dysphagia     Carlos Mathis is a 82 y.o. y/o male  referred for aspiration.  History of choking on saliva   09/29/2022 modified barium swallow history of Parkinson's disease normal swallow function no laryngeal penetration or tracheal aspiration.  History of cognitive decline over 2022 exposed to agent orange in [place].  He states that he has been having difficulty swallowing for a few years no real progression recently.  He does however think his Parkinson's has progressed slowly over the years.  He states he has difficulty swallowing solids and liquids more for liquids at times and makes him cough gag also affect swallowing his saliva at times.  He does admit to having a dry mouth.  No unintentional weight loss.  No regurgitation.  No recent upper endoscopy.  Past Medical History:  Diagnosis Date   Arthritis    BPH (benign prostatic hyperplasia)    CKD (chronic kidney disease), stage III (HCC)     Coronary artery disease    GERD (gastroesophageal reflux disease)    History of hiatal hernia    HLD (hyperlipidemia)    Hypertension    Long term current use of antithrombotics/antiplatelets    a.) DAPT therapy (ASA+ ticagrelor)   MCI (mild cognitive impairment) with memory loss    a.) takes apoaequorin   Migraines    OSA on CPAP    Pneumonia    Skin cancer, basal cell    STEMI (ST elevation myocardial infarction) (HCC) 07/27/2020   a.) MI while living in [place]; underwent PCI placing 4.0 x 18 mm and 3.5 x 38 mm Resolute Onyx DES (vessels unknown/unspecified).   T2DM (type 2 diabetes mellitus) (HCC)    Vertigo    Wears dentures    Full upper, partial lower   Wears hearing aid in both ears     Past Surgical History:  Procedure Laterality Date   CARDIAC CATHETERIZATION     CATARACT EXTRACTION Right    CATARACT EXTRACTION W/PHACO Left 05/03/2022   Procedure: CATARACT EXTRACTION PHACO AND INTRAOCULAR LENS PLACEMENT (IOC) LEFT DIABETIC;  Surgeon: Galen Manila, MD;  Location: [facility];  Service: Ophthalmology;  Laterality: Left;  14.77 1:24.0   HERNIA REPAIR Left    inguinal   INSERTION OF MESH  09/01/2021   Procedure: INSERTION OF MESH;  Surgeon: Carolan Shiver, MD;  Location: [facility];  Service: General;;   JOINT REPLACEMENT     tka   UMBILICAL  HERNIA REPAIR      Prior to Admission medications   Medication Sig Start Date End Date Taking? Authorizing Provider  aspirin 81 MG EC tablet Take 81 mg by mouth daily. 10/06/04   [provider]  atorvastatin (LIPITOR) 40 MG tablet Take 40 mg by mouth every evening. 07/09/19   [provider]  colchicine 0.6 MG tablet 1.2mg  x 1 then 0.6mg  1 hour later x 1. Total 1.8mg  dose / attack. 07/23/20   [provider]  diclofenac (VOLTAREN) 75 MG EC tablet     [provider]  donepezil (ARICEPT ODT) 10 MG disintegrating tablet Take 5 mg by mouth at bedtime.    [provider]   empagliflozin (JARDIANCE) 25 MG TABS tablet Take 12.5 mg by mouth every morning.    [provider]  gentamicin ointment (GARAMYCIN) 0.1 % SMARTSIG:1 Topical Daily 09/01/22   [provider]  glipiZIDE (GLUCOTROL) 10 MG tablet     [provider]  glucose blood ([employer] BLOOD GLUCOSE TEST) test strip USE 1 STRIP FOR TESTING AS DIRECTED EVERY DAY TO TEST BLOOD SUGAR 05/18/20   [provider]  glucose blood test strip USE 1 STRIP FOR TESTING AS DIRECTED EVERY DAY TO TEST BLOOD SUGAR 05/18/20   [provider]  hydrochlorothiazide (HYDRODIURIL) 12.5 MG tablet     [provider]  lidocaine (LIDODERM) 5 % Place 1 patch onto the skin daily. Remove & Discard patch within 12 hours or as directed by MD Patient not taking: Reported on 04/26/2022 04/09/22   Georga Hacking, MD  losartan (COZAAR) 25 MG tablet Take 25 mg by mouth every evening. 08/13/20   [provider]  meclizine (ANTIVERT) 25 MG tablet Take 25 mg by mouth 3 (three) times daily as needed for dizziness.    [provider]  meloxicam (MOBIC) 7.5 MG tablet Take by mouth. 07/23/20   [provider]  metFORMIN (GLUMETZA) 500 MG (MOD) 24 hr tablet Take 500 mg by mouth every evening.    [provider]  metoprolol succinate (TOPROL-XL) 50 MG 24 hr tablet 25 mg every morning. 01/17/06   [provider]  mirabegron ER (MYRBETRIQ) 25 MG TB24 tablet TAKE ONE TABLET BY MOUTH EVERY DAY FOR OVERACTIVE BLADDER 10/10/22   [provider]  mometasone (ELOCON) 0.1 % lotion Apply topically. 09/01/22   [provider]  Multiple Vitamin (MULTIVITAMIN) capsule Take 1 capsule by mouth daily.    [provider]  nitroGLYCERIN (NITROSTAT) 0.4 MG SL tablet Place 0.4 mg under the tongue every 5 (five) minutes as needed for chest pain. 08/13/20   [provider]  Omega-3 Fatty Acids (OMEGA-3 FISH OIL) 1200 MG CAPS Take 1,200 mg by mouth  daily.    [provider]  omeprazole (PRILOSEC) 40 MG capsule Take 1 capsule by mouth daily. 12/07/15   [provider]  OVER THE COUNTER MEDICATION Take 1 capsule by mouth daily. Nerve Control 911    [provider]  rivastigmine (EXELON) 1.5 MG capsule Take 1.5 mg by mouth 2 (two) times daily. Patient not taking: Reported on 04/26/2022    [provider]  tamsulosin (FLOMAX) 0.4 MG CAPS capsule Take 0.4 mg by mouth every evening.    [provider]  triamcinolone cream (KENALOG) 0.1 % APPLY MODERATE AMOUNT TOPICALLY TWO TIMES A DAY AS NEEDED TO FORESKIN. REPLACES BETAMETHASONE 09/20/22   [provider]    No family history on file.   Social History  Tobacco Use   Smoking status: Former    Current packs/day: 0.00    Average packs/day: 1 pack/day for 20.0 years (20.0 ttl pk-yrs)    Types: Cigarettes    Start date: 09/11/1963    Quit date: 09/11/1983    Years since quitting: 39.4   Smokeless tobacco: Former    Types: Chew    Quit date: 09/11/1983  Vaping Use   Vaping status: Never Used  Substance Use Topics   Alcohol use: Not Currently   Drug use: Not Currently    Allergies as of 01/27/2023 - Review Complete 11/16/2022  Allergen Reaction Noted   Meperidine hcl Nausea Only 08/14/2008    Review of Systems:    All systems reviewed and negative except where noted in HPI. General Appearance:    Alert, cooperative, no distress, appears stated age  Head:    Normocephalic, without obvious abnormality, atraumatic  Eyes:    PERRL, conjunctiva/corneas clear,  Ears:    Grossly normal hearing    Neurologic:   Grossly appears normal     Observations/Objective:  Labs: CBC    Component Value Date/Time   WBC 7.0 07/31/2021 0936   RBC 4.87 07/31/2021 0936   HGB 16.0 07/31/2021 0936   HCT 48.4 07/31/2021 0936   PLT 172 07/31/2021 0936   MCV 99.4 07/31/2021 0936   MCH 32.9 07/31/2021 0936   MCHC 33.1 07/31/2021 0936   RDW 12.2  07/31/2021 0936   CMP     Component Value Date/Time   NA 139 07/31/2021 0936   K 4.3 07/31/2021 0936   CL 107 07/31/2021 0936   CO2 26 07/31/2021 0936   GLUCOSE 154 (H) 07/31/2021 0936   BUN 20 07/31/2021 0936   CREATININE 1.06 07/31/2021 0936   CALCIUM 9.3 07/31/2021 0936   GFRNONAA >60 07/31/2021 0936    Imaging Studies: No results found.  Assessment and Plan:   Jalin Olmsted is a 82 y.o. y/o male with a history of Parkinson's disease referred for difficulty swallowing liquids more than solids.  Modified barium swallow does not show any oropharyngeal transfer dysphagia.  He does mention that he suffers from a dry mouth.  The differentials at this point of time would be dysphagia related to dry mouth or esophageal dysphagia.  He has not been on any medication for reflux at this point of time.    Plan :   Commenced on Prilosec 40 mg once a day to empirically treat any reflux related esophageal dysmotility if he does not respond in a few weeks time we will discontinue it Discussed about eating small bites of time and taking sips of liquid along with solid food and not rushing his bites. EGD to rule out any strictures eosinophilic esophagitis Follow-up at my office after these results to discuss how he is doing may need to consider a barium swallow with tablet if needed   I have discussed alternative options, risks & benefits,  which include, but are not limited to, bleeding, infection, perforation,respiratory complication & drug reaction.  The patient agrees with this plan & written consent will be obtained.       I discussed the assessment and treatment plan with the patient. The patient was provided an opportunity to ask questions and all were answered. The patient agreed with the plan and demonstrated an understanding of the instructions.   The patient was advised to call back or seek an in-person evaluation if the symptoms worsen or if the condition fails to improve as  anticipated.  I provided 17 minutes of face-to-face time during this encounter.   Dr Carlos Mood MD,MRCP [facility]) Gastroenterology/Hepatology Pager: [phone]   Speech recognition software was used to dictate the above note.

## 2023-02-01 MED ORDER — OMEPRAZOLE 40 MG PO CPDR: 40.0000 mg | DELAYED_RELEASE_CAPSULE | Freq: Every day | ORAL | 0 refills | Status: AC

## 2023-02-01 NOTE — Addendum Note (Signed)
Addended by: Adela Ports on: 02/01/2023 04:52 PM   Modules accepted: Orders

## 2023-02-09 ENCOUNTER — Other Ambulatory Visit: Payer: Self-pay

## 2023-02-09 ENCOUNTER — Telehealth: Payer: Self-pay

## 2023-02-09 DIAGNOSIS — R1319 Other dysphagia: Secondary | ICD-10-CM

## 2023-02-09 NOTE — Telephone Encounter (Signed)
Called patient to offer him some available dates for his EGD and he agreed on having it done on 03/21/2023 with Dr. Tobi Bastos. Patient stated that he was having surgery tomorrow, so he wanted to wait. I explained his instructions and I told him that I would also mail it to him. Patient understood and had no further questions.

## 2023-02-09 NOTE — Telephone Encounter (Signed)
-----   Message from [name] sent at 02/02/2023  8:06 AM EDT ----- Yes any md ok ----- Message ----- From: Adela Ports, CMA Sent: 02/01/2023   4:54 PM EDT To: [name], MD  Dr. Tobi Bastos, you do not have anything left for August and/or early September. Patient wants to know if another physician could do his EGD? Please advise. ----- Message ----- From: [name], MD Sent: 01/27/2023   1:35 PM EDT To: Adela Ports, CMA  Plan for him  1 EGD 2.  Prilosec 40 mg once a day 3.  Follow-up in 3 months

## 2023-02-22 ENCOUNTER — Ambulatory Visit: Payer: Medicare Other | Attending: Neurology

## 2023-02-22 DIAGNOSIS — M6281 Muscle weakness (generalized): Secondary | ICD-10-CM | POA: Diagnosis present

## 2023-02-22 DIAGNOSIS — R278 Other lack of coordination: Secondary | ICD-10-CM | POA: Insufficient documentation

## 2023-02-22 DIAGNOSIS — G20B1 Parkinson's disease with dyskinesia, without mention of fluctuations: Secondary | ICD-10-CM | POA: Diagnosis present

## 2023-02-22 DIAGNOSIS — R2681 Unsteadiness on feet: Secondary | ICD-10-CM | POA: Insufficient documentation

## 2023-02-22 DIAGNOSIS — R2689 Other abnormalities of gait and mobility: Secondary | ICD-10-CM | POA: Diagnosis present

## 2023-02-22 DIAGNOSIS — R269 Unspecified abnormalities of gait and mobility: Secondary | ICD-10-CM | POA: Insufficient documentation

## 2023-02-22 DIAGNOSIS — R262 Difficulty in walking, not elsewhere classified: Secondary | ICD-10-CM | POA: Diagnosis present

## 2023-02-22 NOTE — Therapy (Signed)
OUTPATIENT PHYSICAL THERAPY NEURO EVALUATION   Patient Name: Carlos Mathis MRN: [id number] DOB:03-22-1941, 82 y.o., male Today's Date: 02/22/2023   PCP: Dr. Einar Crow REFERRING PROVIDER: Dr. Cristopher Peru  END OF SESSION:  PT End of Session - 02/22/23 0929     Visit Number 1    Number of Visits 24    Date for PT Re-Evaluation 05/17/23    Progress Note Due on Visit 10    PT Start Time 0924    PT Stop Time 1014    PT Time Calculation (min) 50 min    Equipment Utilized During Treatment Gait belt    Activity Tolerance Patient tolerated treatment well    Behavior During Therapy WFL for tasks assessed/performed             Past Medical History:  Diagnosis Date   Arthritis    BPH (benign prostatic hyperplasia)    CKD (chronic kidney disease), stage III (HCC)    Coronary artery disease    GERD (gastroesophageal reflux disease)    History of hiatal hernia    HLD (hyperlipidemia)    Hypertension    Long term current use of antithrombotics/antiplatelets    a.) DAPT therapy (ASA+ ticagrelor)   MCI (mild cognitive impairment) with memory loss    a.) takes apoaequorin   Migraines    OSA on CPAP    Pneumonia    Skin cancer, basal cell    STEMI (ST elevation myocardial infarction) (HCC) 07/27/2020   a.) MI while living in [place]; underwent PCI placing 4.0 x 18 mm and 3.5 x 38 mm Resolute Onyx DES (vessels unknown/unspecified).   T2DM (type 2 diabetes mellitus) (HCC)    Vertigo    Wears dentures    Full upper, partial lower   Wears hearing aid in both ears    Past Surgical History:  Procedure Laterality Date   CARDIAC CATHETERIZATION     CATARACT EXTRACTION Right    CATARACT EXTRACTION W/PHACO Left 05/03/2022   Procedure: CATARACT EXTRACTION PHACO AND INTRAOCULAR LENS PLACEMENT (IOC) LEFT DIABETIC;  Surgeon: Galen Manila, MD;  Location: [facility];  Service: Ophthalmology;  Laterality: Left;  14.77 1:24.0   HERNIA REPAIR Left    inguinal    INSERTION OF MESH  09/01/2021   Procedure: INSERTION OF MESH;  Surgeon: Carolan Shiver, MD;  Location: [facility];  Service: General;;   JOINT REPLACEMENT     tka   UMBILICAL HERNIA REPAIR     Patient Active Problem List   Diagnosis Date Noted   Acute myocardial infarction, unspecified (HCC) 11/16/2022   Anxiety state 11/16/2022   Balanitis 11/16/2022   Benign paroxysmal positional vertigo 11/16/2022   Cardiac dysrhythmia 11/16/2022   Chronic ischemic heart disease 11/16/2022   Chronic sinusitis 11/16/2022   Gastroesophageal reflux disease 11/16/2022   Headache 11/16/2022   Counseling, unspecified 11/16/2022   History of occlusive disease of artery of lower extremity 11/16/2022   Hypertensive chronic kidney disease with stage 1 through stage 4 chronic kidney disease, or unspecified chronic kidney disease 11/16/2022   Irritable bowel syndrome 11/16/2022   Unspecified dementia, mild, without behavioral disturbance, psychotic disturbance, mood disturbance, and anxiety (HCC) 11/16/2022   Obstructive sleep apnea (adult) (pediatric) 11/16/2022   Osteoarthritis of knee 11/16/2022   Personal history of tuberculosis 11/16/2022   Phimosis 11/16/2022   Restless legs 11/16/2022   Sensorineural hearing loss, bilateral 11/16/2022   Type 2 diabetes mellitus without complications (HCC) 11/16/2022   Diabetic polyneuropathy associated with type 2  diabetes mellitus (HCC) 06/24/2021   Healthcare maintenance 06/24/2021   CAD (coronary artery disease) 09/02/2020   Pure hypercholesterolemia 09/02/2020   Polycystic kidney 02/11/2020   Benign prostatic hyperplasia 02/11/2020   Essential hypertension 02/11/2020   Acquired cyst of kidney 07/09/2019   Spinal stenosis of lumbar region 05/30/2019   Cervical spondylosis 03/12/2019   Dysphagia 03/26/2015   Hypermetropia 02/04/2015   Presbyopia 02/04/2015   Senile nuclear cataract 02/04/2015   Epistaxis 09/23/2014   Laryngopharyngeal reflux (LPR)  09/23/2014    ONSET DATE: worse since Jan 2024  REFERRING DIAG:  Diagnosis  G20.A1 (ICD-10-CM) - Parkinson's disease    THERAPY DIAG:  Abnormality of gait and mobility  Difficulty in walking, not elsewhere classified  Muscle weakness (generalized)  Unsteadiness on feet  Other abnormalities of gait and mobility  Other lack of coordination  Rationale for Evaluation and Treatment: Rehabilitation  SUBJECTIVE:                                                                                                                                                                                             SUBJECTIVE STATEMENT: Patient reports he feels like he is here for weakness. Reports difficulty with activities at home like mowing (push). Patient does report going to [employer] class 3x/week.   Pt accompanied by: self  PERTINENT HISTORY:   Per neurology note submitted in chart by Janice Coffin, PA.  1. 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 Memory loss starting 2018 with cognitive decline over 2022. Patient reports difficulty remembering conversations, difficulty recognizing old friends, getting lost while driving. Per spouse patient can become aggressive behind the wheel. Patient reports visual hallucinations (dark areas out of his periphery) and audio hallucinations. Wife assists with medications and finances. Has become more emotional over the last year and sometimes becomes angry with spouse which is unusual. Denies alcohol use or difficulty sleeping. Per wife significant procrastiantion, low motivation, more hunched forward posture, balance issues, and gets going walking and has a difficult time stopping.   Patient was cleared to drive per driving assessment, however, we discussed that memory may not  keep up with the physical ability to drive if condition continues to get worse   Ambulatory Referral to Physical Therapy [facility])   2. Peripheral neuropathy- decreased sensation with some pain in the feet in a patient with history of agent orange exposure.  3. Orthostatic Lightheadedness/Vertigo, chronic episodic vertigo  Orthostatic hypotension (neurogenic) can be seen in many conditions such as  parkinsonism, diabetic neuropathy, pure autonomic failure etc. Patient should have work up to rule out other causes first such as dehydration, cardiac issues, iatrogenic due to meds etc. Lightheadedness, fainting, fatigue, blurry vision, weakness etc can happen as symptoms. Patient should drink plenty of water, wear compression stockings, exercise, liberal salts, elevate the head end of the bed etc. Fludrocortisone, Midodrine, droxidopa etc. Can be tried in certain patients.   PAIN:  Are you having pain? No  PRECAUTIONS: Fall  RED FLAGS: None   WEIGHT BEARING RESTRICTIONS: No  FALLS: Has patient fallen in last 6 months? No  LIVING ENVIRONMENT: Lives with: Wife Lives in: House/apartment Stairs: Yes: External: 5 steps; can reach both Has following equipment at home: Single point cane and Walker - 2 wheeled  PLOF: Independent  PATIENT GOALS: I want to be stronger and more steady  OBJECTIVE:   DIAGNOSTIC FINDINGS: CLINICAL DATA:  Provided history: Mild cognitive impairment with memory loss.   EXAM: MRI HEAD WITHOUT CONTRAST   TECHNIQUE: Multiplanar, multiecho pulse sequences of the brain and surrounding structures were obtained without intravenous contrast.   COMPARISON:  Head CT 07/31/2021.   FINDINGS: Brain:   Mild-to-moderate generalized cerebral atrophy. Commensurate prominence of the ventricles and sulci. Comparatively mild cerebellar atrophy.   Multifocal T2 FLAIR hyperintense signal abnormality within the cerebral white matter and pons, nonspecific but compatible  with moderate chronic small vessel ischemic disease.   Mild chronic small vessel ischemic changes within the thalami.   There is no acute infarct.   No evidence of an intracranial mass.   No chronic intracranial blood products.   No extra-axial fluid collection.   No midline shift.   Vascular: Maintained flow voids within the proximal large arterial vessels.   Skull and upper cervical spine: No focal suspicious marrow lesion. Degenerative changes and ligamentous hypertrophy at the C1-C2 articulation.   Sinuses/Orbits: No mass or acute finding within the imaged orbits. Prior right ocular lens replacement. Mild mucosal thickening within the bilateral ethmoid and right sphenoid sinuses.   Other: Small-volume fluid within the right mastoid air cells.   IMPRESSION: 1. No evidence of acute intracranial abnormality. 2. Moderate chronic small vessel ischemic changes within the cerebral white matter and pons. 3. Mild chronic small-vessel ischemic changes within the thalami. 4. Mild-to-moderate generalized cerebral atrophy. Comparatively mild cerebellar atrophy. 5. Mild mucosal thickening within the bilateral ethmoid and right sphenoid sinuses. 6. Small-volume fluid within the right mastoid air cells.     Electronically Signed   By: Jackey Loge D.O.   On: 11/11/2021 17:53    COGNITION: Overall cognitive status: Impaired   SENSATION: Light touch: Impaired   COORDINATION: Some difficulty sequencing   POSTURE: forward head   LOWER EXTREMITY MMT:    MMT Right Eval Left Eval  Hip flexion 3+ 4  Hip extension 4 4  Hip abduction 4 4  Hip adduction 4 4  Hip internal rotation 4 4  Hip external rotation 4 4  Knee flexion 4 4  Knee extension 4 4  Ankle dorsiflexion    Ankle plantarflexion    Ankle inversion    Ankle eversion    (Blank rows = not tested)    TRANSFERS: Assistive device utilized: None  Sit to stand: Complete Independence Stand to sit:  Complete Independence Chair to chair: Complete Independence Floor:  Not tested  GAIT: Gait pattern: step through pattern, decreased arm swing- Right, decreased arm swing- Left, decreased step length- Right, and decreased step length- Left Distance walked: 100+  feet Assistive device utilized: None Level of assistance: SBA Comments: Shuffling gait  FUNCTIONAL TESTS:  5 times sit to stand: 18.48 sec without UE Support  Timed up and go (TUG): 16.42 sec without UE support 6 minute walk test: To be tested 2nd visit 10 meter walk test: 14.84 and 14.62 sec  = 0.69 m/s avg without UE support Berg Balance Scale: 43/56  PATIENT SURVEYS:  FOTO 49 with goal of 42  TODAY'S TREATMENT:                                                                                                                              DATE: 02/22/2023- PT Evaluation performed    PATIENT EDUCATION: Education details: Purpose of PT and plan of care Person educated: Patient Education method: Explanation Education comprehension: verbalized understanding  HOME EXERCISE PROGRAM: To be initiated next 1-2 visits  GOALS: Goals reviewed with patient? Yes  SHORT TERM GOALS: Target date: 04/05/2023  Pt will be independent with HEP in order to improve strength and balance in order to decrease fall risk and improve function at home and work.   Baseline: EVAL- No formal HEP In place Goal status: INITIAL   LONG TERM GOALS: Target date: 05/17/2023  1.  Patient (> 34 years old) will complete five times sit to stand test in < 15 seconds indicating an increased LE strength and improved balance. Baseline: EVAL= 18.48 sec without UE support Goal status: INITIAL  2.  Patient will increase FOTO score to equal to or greater than  49   to demonstrate statistically significant improvement in mobility and quality of life.  Baseline: EVAL=49 Goal status: INITIAL   3.  Patient will increase Berg Balance score by > 6 points to  demonstrate decreased fall risk during functional activities. Baseline: EVAL=43/56 Goal status: INITIAL   4.  Patient will reduce timed up and go to <11 seconds to reduce fall risk and demonstrate improved transfer/gait ability. Baseline: EVAL=16.42 Goal status: INITIAL  5.  Patient will increase 10 meter walk test to >1.49m/s as to improve gait speed for better community ambulation and to reduce fall risk. Baseline: EVAL= 0.69 Goal status: INITIAL  6. Patient will increase six minute walk test distance to >1000 for progression to community ambulator and improve gait ability Baseline: 02/22/2023 Goal status: INITIAL   ASSESSMENT:  CLINICAL IMPRESSION: Patient is a 82 y.o. male who was seen today for physical therapy evaluation and treatment for Parkinson's disease. He presents with good motivation and highly active with desire to improve his condition. He presents with some LE muscle weakness, Impaired balance and shuffling gait affecting his lifestyle with some noted risk of falling per functional outcome measures taken today. Patient will benefit from skilled PT services to address his weakness and imbalance for improved quality of life and decreased risk of falling.   OBJECTIVE IMPAIRMENTS: Abnormal gait, decreased activity tolerance, decreased balance, decreased cognition, decreased coordination, decreased endurance, decreased mobility, difficulty  walking, decreased ROM, decreased strength, hypomobility, and postural dysfunction.   ACTIVITY LIMITATIONS: carrying, lifting, bending, standing, squatting, and stairs  PARTICIPATION LIMITATIONS: cleaning, shopping, community activity, and yard work  PERSONAL FACTORS: Age and 3+ comorbidities: HTN, Vertigo, arthritis  are also affecting patient's functional outcome.   REHAB POTENTIAL: Good  CLINICAL DECISION MAKING: Stable/uncomplicated  EVALUATION COMPLEXITY: Moderate  PLAN:  PT FREQUENCY: 1-2x/week  PT DURATION: 12 weeks  PLANNED  INTERVENTIONS: Therapeutic exercises, Therapeutic activity, Neuromuscular re-education, Balance training, Gait training, Patient/Family education, Self Care, Joint mobilization, Joint manipulation, Stair training, Vestibular training, Canalith repositioning, Orthotic/Fit training, DME instructions, Dry Needling, Spinal manipulation, Spinal mobilization, Cryotherapy, Moist heat, Taping, Manual therapy, and Re-evaluation  PLAN FOR NEXT SESSION: 6 min walk, Implement balance and therex - add to HEP as appropriate.    Lenda Kelp, PT 02/22/2023, 10:36 PM

## 2023-02-28 ENCOUNTER — Ambulatory Visit: Payer: Medicare Other

## 2023-02-28 DIAGNOSIS — R2681 Unsteadiness on feet: Secondary | ICD-10-CM

## 2023-02-28 DIAGNOSIS — R262 Difficulty in walking, not elsewhere classified: Secondary | ICD-10-CM

## 2023-02-28 DIAGNOSIS — R269 Unspecified abnormalities of gait and mobility: Secondary | ICD-10-CM

## 2023-02-28 DIAGNOSIS — M6281 Muscle weakness (generalized): Secondary | ICD-10-CM

## 2023-02-28 DIAGNOSIS — R278 Other lack of coordination: Secondary | ICD-10-CM

## 2023-02-28 DIAGNOSIS — R2689 Other abnormalities of gait and mobility: Secondary | ICD-10-CM

## 2023-02-28 NOTE — Therapy (Signed)
OUTPATIENT PHYSICAL THERAPY NEURO TREATMENT   Patient Name: Carlos Mathis MRN: [id number] DOB:1941-06-06, 82 y.o., male Today's Date: 02/28/2023   PCP: Dr. Einar Crow REFERRING PROVIDER: Dr. Cristopher Peru  END OF SESSION:  PT End of Session - 02/28/23 0805     Visit Number 2    Number of Visits 24    Date for PT Re-Evaluation 05/17/23    Progress Note Due on Visit 10    PT Start Time 0801    PT Stop Time 0845    PT Time Calculation (min) 44 min    Equipment Utilized During Treatment Gait belt    Activity Tolerance Patient tolerated treatment well    Behavior During Therapy WFL for tasks assessed/performed              Past Medical History:  Diagnosis Date   Arthritis    BPH (benign prostatic hyperplasia)    CKD (chronic kidney disease), stage III (HCC)    Coronary artery disease    GERD (gastroesophageal reflux disease)    History of hiatal hernia    HLD (hyperlipidemia)    Hypertension    Long term current use of antithrombotics/antiplatelets    a.) DAPT therapy (ASA+ ticagrelor)   MCI (mild cognitive impairment) with memory loss    a.) takes apoaequorin   Migraines    OSA on CPAP    Pneumonia    Skin cancer, basal cell    STEMI (ST elevation myocardial infarction) (HCC) 07/27/2020   a.) MI while living in [place]; underwent PCI placing 4.0 x 18 mm and 3.5 x 38 mm Resolute Onyx DES (vessels unknown/unspecified).   T2DM (type 2 diabetes mellitus) (HCC)    Vertigo    Wears dentures    Full upper, partial lower   Wears hearing aid in both ears    Past Surgical History:  Procedure Laterality Date   CARDIAC CATHETERIZATION     CATARACT EXTRACTION Right    CATARACT EXTRACTION W/PHACO Left 05/03/2022   Procedure: CATARACT EXTRACTION PHACO AND INTRAOCULAR LENS PLACEMENT (IOC) LEFT DIABETIC;  Surgeon: Galen Manila, MD;  Location: [facility];  Service: Ophthalmology;  Laterality: Left;  14.77 1:24.0   HERNIA REPAIR Left    inguinal    INSERTION OF MESH  09/01/2021   Procedure: INSERTION OF MESH;  Surgeon: Carolan Shiver, MD;  Location: [facility];  Service: General;;   JOINT REPLACEMENT     tka   UMBILICAL HERNIA REPAIR     Patient Active Problem List   Diagnosis Date Noted   Acute myocardial infarction, unspecified (HCC) 11/16/2022   Anxiety state 11/16/2022   Balanitis 11/16/2022   Benign paroxysmal positional vertigo 11/16/2022   Cardiac dysrhythmia 11/16/2022   Chronic ischemic heart disease 11/16/2022   Chronic sinusitis 11/16/2022   Gastroesophageal reflux disease 11/16/2022   Headache 11/16/2022   Counseling, unspecified 11/16/2022   History of occlusive disease of artery of lower extremity 11/16/2022   Hypertensive chronic kidney disease with stage 1 through stage 4 chronic kidney disease, or unspecified chronic kidney disease 11/16/2022   Irritable bowel syndrome 11/16/2022   Unspecified dementia, mild, without behavioral disturbance, psychotic disturbance, mood disturbance, and anxiety (HCC) 11/16/2022   Obstructive sleep apnea (adult) (pediatric) 11/16/2022   Osteoarthritis of knee 11/16/2022   Personal history of tuberculosis 11/16/2022   Phimosis 11/16/2022   Restless legs 11/16/2022   Sensorineural hearing loss, bilateral 11/16/2022   Type 2 diabetes mellitus without complications (HCC) 11/16/2022   Diabetic polyneuropathy associated with type  2 diabetes mellitus (HCC) 06/24/2021   Healthcare maintenance 06/24/2021   CAD (coronary artery disease) 09/02/2020   Pure hypercholesterolemia 09/02/2020   Polycystic kidney 02/11/2020   Benign prostatic hyperplasia 02/11/2020   Essential hypertension 02/11/2020   Acquired cyst of kidney 07/09/2019   Spinal stenosis of lumbar region 05/30/2019   Cervical spondylosis 03/12/2019   Dysphagia 03/26/2015   Hypermetropia 02/04/2015   Presbyopia 02/04/2015   Senile nuclear cataract 02/04/2015   Epistaxis 09/23/2014   Laryngopharyngeal reflux (LPR)  09/23/2014    ONSET DATE: worse since Jan 2024  REFERRING DIAG:  Diagnosis  G20.A1 (ICD-10-CM) - Parkinson's disease    THERAPY DIAG:  Abnormality of gait and mobility  Difficulty in walking, not elsewhere classified  Muscle weakness (generalized)  Unsteadiness on feet  Other abnormalities of gait and mobility  Other lack of coordination  Rationale for Evaluation and Treatment: Rehabilitation  SUBJECTIVE:                                                                                                                                                                                             SUBJECTIVE STATEMENT: Patient reports resumed his [employer] steady class and doing better than he expected after surgery.   Pt accompanied by: self  PERTINENT HISTORY:   Per neurology note submitted in chart by Janice Coffin, PA.  1. 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 Memory loss starting 2018 with cognitive decline over 2022. Patient reports difficulty remembering conversations, difficulty recognizing old friends, getting lost while driving. Per spouse patient can become aggressive behind the wheel. Patient reports visual hallucinations (dark areas out of his periphery) and audio hallucinations. Wife assists with medications and finances. Has become more emotional over the last year and sometimes becomes angry with spouse which is unusual. Denies alcohol use or difficulty sleeping. Per wife significant procrastiantion, low motivation, more hunched forward posture, balance issues, and gets going walking and has a difficult time stopping.   Patient was cleared to drive per driving assessment, however, we discussed that memory may not keep up with the physical ability to drive if condition continues to get worse    Ambulatory Referral to Physical Therapy [facility])   2. Peripheral neuropathy- decreased sensation with some pain in the feet in a patient with history of agent orange exposure.  3. Orthostatic Lightheadedness/Vertigo, chronic episodic vertigo  Orthostatic hypotension (neurogenic) can be seen in many conditions such as parkinsonism, diabetic neuropathy, pure autonomic failure etc. Patient should have work  up to rule out other causes first such as dehydration, cardiac issues, iatrogenic due to meds etc. Lightheadedness, fainting, fatigue, blurry vision, weakness etc can happen as symptoms. Patient should drink plenty of water, wear compression stockings, exercise, liberal salts, elevate the head end of the bed etc. Fludrocortisone, Midodrine, droxidopa etc. Can be tried in certain patients.   PAIN:  Are you having pain? No  PRECAUTIONS: Fall  RED FLAGS: None   WEIGHT BEARING RESTRICTIONS: No  FALLS: Has patient fallen in last 6 months? No  LIVING ENVIRONMENT: Lives with: Wife Lives in: House/apartment Stairs: Yes: External: 5 steps; can reach both Has following equipment at home: Single point cane and Walker - 2 wheeled  PLOF: Independent  PATIENT GOALS: I want to be stronger and more steady  OBJECTIVE:   DIAGNOSTIC FINDINGS: CLINICAL DATA:  Provided history: Mild cognitive impairment with memory loss.   EXAM: MRI HEAD WITHOUT CONTRAST   TECHNIQUE: Multiplanar, multiecho pulse sequences of the brain and surrounding structures were obtained without intravenous contrast.   COMPARISON:  Head CT 07/31/2021.   FINDINGS: Brain:   Mild-to-moderate generalized cerebral atrophy. Commensurate prominence of the ventricles and sulci. Comparatively mild cerebellar atrophy.   Multifocal T2 FLAIR hyperintense signal abnormality within the cerebral white matter and pons, nonspecific but compatible with moderate chronic small vessel ischemic disease.   Mild chronic small  vessel ischemic changes within the thalami.   There is no acute infarct.   No evidence of an intracranial mass.   No chronic intracranial blood products.   No extra-axial fluid collection.   No midline shift.   Vascular: Maintained flow voids within the proximal large arterial vessels.   Skull and upper cervical spine: No focal suspicious marrow lesion. Degenerative changes and ligamentous hypertrophy at the C1-C2 articulation.   Sinuses/Orbits: No mass or acute finding within the imaged orbits. Prior right ocular lens replacement. Mild mucosal thickening within the bilateral ethmoid and right sphenoid sinuses.   Other: Small-volume fluid within the right mastoid air cells.   IMPRESSION: 1. No evidence of acute intracranial abnormality. 2. Moderate chronic small vessel ischemic changes within the cerebral white matter and pons. 3. Mild chronic small-vessel ischemic changes within the thalami. 4. Mild-to-moderate generalized cerebral atrophy. Comparatively mild cerebellar atrophy. 5. Mild mucosal thickening within the bilateral ethmoid and right sphenoid sinuses. 6. Small-volume fluid within the right mastoid air cells.     Electronically Signed   By: Jackey Loge D.O.   On: 11/11/2021 17:53    COGNITION: Overall cognitive status: Impaired   SENSATION: Light touch: Impaired   COORDINATION: Some difficulty sequencing   POSTURE: forward head   LOWER EXTREMITY MMT:    MMT Right Eval Left Eval  Hip flexion 3+ 4  Hip extension 4 4  Hip abduction 4 4  Hip adduction 4 4  Hip internal rotation 4 4  Hip external rotation 4 4  Knee flexion 4 4  Knee extension 4 4  Ankle dorsiflexion    Ankle plantarflexion    Ankle inversion    Ankle eversion    (Blank rows = not tested)    TRANSFERS: Assistive device utilized: None  Sit to stand: Complete Independence Stand to sit: Complete Independence Chair to chair: Complete Independence Floor:  Not  tested  GAIT: Gait pattern: step through pattern, decreased arm swing- Right, decreased arm swing- Left, decreased step length- Right, and decreased step length- Left Distance walked: 100+ feet Assistive device utilized: None Level of assistance: SBA Comments: Shuffling  gait  FUNCTIONAL TESTS:  5 times sit to stand: 18.48 sec without UE Support  Timed up and go (TUG): 16.42 sec without UE support 6 minute walk test: To be tested 2nd visit 10 meter walk test: 14.84 and 14.62 sec  = 0.69 m/s avg without UE support Berg Balance Scale: 43/56  PATIENT SURVEYS:  FOTO 49 with goal of 43  TODAY'S TREATMENT:                                                                                                                              DATE: 02/28/23    NMR: (all activities performed with CGA, gait belt) Dynamic high knee marching (// bars) down and back x 10- focusing on raising knees vs. shuffling  Ham curl walk in // bars- down and back x5 Combination movement consist of STS without UE support then step forward and to right - reach overhead with right arm and touch wall then repeat on left then sit back down.  Standing thoracic rotation- Open/closed book- x 15 reps (VC for technique)  Resistive gait 5# AW x 300 feet (shuffling gait with VC to take larger step- several instances of forward lean and out of control walker requiring CGA to recover)   THEREX:  Calf raises 2 x 15 reps Toe raises 2 x 15 reps Chin tuck wall posture hold x 30 sec x 3 Chops with back against wall holding ball x 15 reps each side Standing ER at wall - pressing into towel roll    PATIENT EDUCATION: Education details: Purpose of PT and plan of care Person educated: Patient Education method: Explanation Education comprehension: verbalized understanding  HOME EXERCISE PROGRAM: To be initiated next 1-2 visits  GOALS: Goals reviewed with patient? Yes  SHORT TERM GOALS: Target date: 04/05/2023  Pt will be  independent with HEP in order to improve strength and balance in order to decrease fall risk and improve function at home and work.   Baseline: EVAL- No formal HEP In place Goal status: INITIAL   LONG TERM GOALS: Target date: 05/17/2023  1.  Patient (> 21 years old) will complete five times sit to stand test in < 15 seconds indicating an increased LE strength and improved balance. Baseline: EVAL= 18.48 sec without UE support Goal status: INITIAL  2.  Patient will increase FOTO score to equal to or greater than  49   to demonstrate statistically significant improvement in mobility and quality of life.  Baseline: EVAL=49 Goal status: INITIAL   3.  Patient will increase Berg Balance score by > 6 points to demonstrate decreased fall risk during functional activities. Baseline: EVAL=43/56 Goal status: INITIAL   4.  Patient will reduce timed up and go to <11 seconds to reduce fall risk and demonstrate improved transfer/gait ability. Baseline: EVAL=16.42 Goal status: INITIAL  5.  Patient will increase 10 meter walk test to >1.62m/s as to improve gait speed for better community ambulation and  to reduce fall risk. Baseline: EVAL= 0.69 Goal status: INITIAL  6. Patient will increase six minute walk test distance to >1000 for progression to community ambulator and improve gait ability Baseline: 02/22/2023 Goal status: INITIAL   ASSESSMENT:  CLINICAL IMPRESSION: Patient arrived with good motivation for his second visit. He performed well with intro to some basic coordination, basic large based movement and strengthening. He only struggled with resistive gait- unable to break shuffle and forward trunk lean today.  Patient will benefit from skilled PT services to address his weakness and imbalance for improved quality of life and decreased risk of falling.   OBJECTIVE IMPAIRMENTS: Abnormal gait, decreased activity tolerance, decreased balance, decreased cognition, decreased coordination, decreased  endurance, decreased mobility, difficulty walking, decreased ROM, decreased strength, hypomobility, and postural dysfunction.   ACTIVITY LIMITATIONS: carrying, lifting, bending, standing, squatting, and stairs  PARTICIPATION LIMITATIONS: cleaning, shopping, community activity, and yard work  PERSONAL FACTORS: Age and 3+ comorbidities: HTN, Vertigo, arthritis  are also affecting patient's functional outcome.   REHAB POTENTIAL: Good  CLINICAL DECISION MAKING: Stable/uncomplicated  EVALUATION COMPLEXITY: Moderate  PLAN:  PT FREQUENCY: 1-2x/week  PT DURATION: 12 weeks  PLANNED INTERVENTIONS: Therapeutic exercises, Therapeutic activity, Neuromuscular re-education, Balance training, Gait training, Patient/Family education, Self Care, Joint mobilization, Joint manipulation, Stair training, Vestibular training, Canalith repositioning, Orthotic/Fit training, DME instructions, Dry Needling, Spinal manipulation, Spinal mobilization, Cryotherapy, Moist heat, Taping, Manual therapy, and Re-evaluation  PLAN FOR NEXT SESSION: 6 min walk, Implement balance and therex - add to HEP as appropriate.    Lenda Kelp, PT 02/28/2023, 8:47 AM

## 2023-03-02 ENCOUNTER — Ambulatory Visit: Payer: Medicare Other

## 2023-03-02 DIAGNOSIS — R269 Unspecified abnormalities of gait and mobility: Secondary | ICD-10-CM | POA: Diagnosis not present

## 2023-03-02 DIAGNOSIS — M6281 Muscle weakness (generalized): Secondary | ICD-10-CM

## 2023-03-02 DIAGNOSIS — G20B1 Parkinson's disease with dyskinesia, without mention of fluctuations: Secondary | ICD-10-CM

## 2023-03-02 DIAGNOSIS — R2689 Other abnormalities of gait and mobility: Secondary | ICD-10-CM

## 2023-03-02 DIAGNOSIS — R2681 Unsteadiness on feet: Secondary | ICD-10-CM

## 2023-03-02 DIAGNOSIS — R262 Difficulty in walking, not elsewhere classified: Secondary | ICD-10-CM

## 2023-03-02 DIAGNOSIS — R278 Other lack of coordination: Secondary | ICD-10-CM

## 2023-03-02 NOTE — Therapy (Signed)
OUTPATIENT PHYSICAL THERAPY NEURO TREATMENT   Patient Name: Carlos Mathis MRN: [id number] DOB:1941-03-06, 82 y.o., male Today's Date: 03/02/2023   PCP: Dr. Einar Crow REFERRING PROVIDER: Dr. Cristopher Peru  END OF SESSION:  PT End of Session - 03/02/23 1024     Visit Number 3    Number of Visits 24    Date for PT Re-Evaluation 05/17/23    Progress Note Due on Visit 10    PT Start Time 1016    PT Stop Time 1058    PT Time Calculation (min) 42 min    Equipment Utilized During Treatment Gait belt    Activity Tolerance Patient tolerated treatment well    Behavior During Therapy WFL for tasks assessed/performed               Past Medical History:  Diagnosis Date   Arthritis    BPH (benign prostatic hyperplasia)    CKD (chronic kidney disease), stage III (HCC)    Coronary artery disease    GERD (gastroesophageal reflux disease)    History of hiatal hernia    HLD (hyperlipidemia)    Hypertension    Long term current use of antithrombotics/antiplatelets    a.) DAPT therapy (ASA+ ticagrelor)   MCI (mild cognitive impairment) with memory loss    a.) takes apoaequorin   Migraines    OSA on CPAP    Pneumonia    Skin cancer, basal cell    STEMI (ST elevation myocardial infarction) (HCC) 07/27/2020   a.) MI while living in [place]; underwent PCI placing 4.0 x 18 mm and 3.5 x 38 mm Resolute Onyx DES (vessels unknown/unspecified).   T2DM (type 2 diabetes mellitus) (HCC)    Vertigo    Wears dentures    Full upper, partial lower   Wears hearing aid in both ears    Past Surgical History:  Procedure Laterality Date   CARDIAC CATHETERIZATION     CATARACT EXTRACTION Right    CATARACT EXTRACTION W/PHACO Left 05/03/2022   Procedure: CATARACT EXTRACTION PHACO AND INTRAOCULAR LENS PLACEMENT (IOC) LEFT DIABETIC;  Surgeon: Galen Manila, MD;  Location: [facility];  Service: Ophthalmology;  Laterality: Left;  14.77 1:24.0   HERNIA REPAIR Left    inguinal    INSERTION OF MESH  09/01/2021   Procedure: INSERTION OF MESH;  Surgeon: Carolan Shiver, MD;  Location: [facility];  Service: General;;   JOINT REPLACEMENT     tka   UMBILICAL HERNIA REPAIR     Patient Active Problem List   Diagnosis Date Noted   Acute myocardial infarction, unspecified (HCC) 11/16/2022   Anxiety state 11/16/2022   Balanitis 11/16/2022   Benign paroxysmal positional vertigo 11/16/2022   Cardiac dysrhythmia 11/16/2022   Chronic ischemic heart disease 11/16/2022   Chronic sinusitis 11/16/2022   Gastroesophageal reflux disease 11/16/2022   Headache 11/16/2022   Counseling, unspecified 11/16/2022   History of occlusive disease of artery of lower extremity 11/16/2022   Hypertensive chronic kidney disease with stage 1 through stage 4 chronic kidney disease, or unspecified chronic kidney disease 11/16/2022   Irritable bowel syndrome 11/16/2022   Unspecified dementia, mild, without behavioral disturbance, psychotic disturbance, mood disturbance, and anxiety (HCC) 11/16/2022   Obstructive sleep apnea (adult) (pediatric) 11/16/2022   Osteoarthritis of knee 11/16/2022   Personal history of tuberculosis 11/16/2022   Phimosis 11/16/2022   Restless legs 11/16/2022   Sensorineural hearing loss, bilateral 11/16/2022   Type 2 diabetes mellitus without complications (HCC) 11/16/2022   Diabetic polyneuropathy associated with  type 2 diabetes mellitus (HCC) 06/24/2021   Healthcare maintenance 06/24/2021   CAD (coronary artery disease) 09/02/2020   Pure hypercholesterolemia 09/02/2020   Polycystic kidney 02/11/2020   Benign prostatic hyperplasia 02/11/2020   Essential hypertension 02/11/2020   Acquired cyst of kidney 07/09/2019   Spinal stenosis of lumbar region 05/30/2019   Cervical spondylosis 03/12/2019   Dysphagia 03/26/2015   Hypermetropia 02/04/2015   Presbyopia 02/04/2015   Senile nuclear cataract 02/04/2015   Epistaxis 09/23/2014   Laryngopharyngeal reflux (LPR)  09/23/2014    ONSET DATE: worse since Jan 2024  REFERRING DIAG:  Diagnosis  G20.A1 (ICD-10-CM) - Parkinson's disease    THERAPY DIAG:  Abnormality of gait and mobility  Difficulty in walking, not elsewhere classified  Muscle weakness (generalized)  Unsteadiness on feet  Other abnormalities of gait and mobility  Other lack of coordination  Parkinson's disease with dyskinesia, unspecified whether manifestations fluctuate  Rationale for Evaluation and Treatment: Rehabilitation  SUBJECTIVE:                                                                                                                                                                                             SUBJECTIVE STATEMENT: Patient reports excited to get his car back today  Pt accompanied by: self  PERTINENT HISTORY:   Per neurology note submitted in chart by Janice Coffin, PA.  1. 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 Memory loss starting 2018 with cognitive decline over 2022. Patient reports difficulty remembering conversations, difficulty recognizing old friends, getting lost while driving. Per spouse patient can become aggressive behind the wheel. Patient reports visual hallucinations (dark areas out of his periphery) and audio hallucinations. Wife assists with medications and finances. Has become more emotional over the last year and sometimes becomes angry with spouse which is unusual. Denies alcohol use or difficulty sleeping. Per wife significant procrastiantion, low motivation, more hunched forward posture, balance issues, and gets going walking and has a difficult time stopping.   Patient was cleared to drive per driving assessment, however, we discussed that memory may not keep up with the physical ability to drive  if condition continues to get worse   Ambulatory Referral to Physical Therapy [facility])   2. Peripheral neuropathy- decreased sensation with some pain in the feet in a patient with history of agent orange exposure.  3. Orthostatic Lightheadedness/Vertigo, chronic episodic vertigo  Orthostatic hypotension (neurogenic) can be seen in many conditions such as parkinsonism, diabetic neuropathy, pure autonomic failure etc. Patient  should have work up to rule out other causes first such as dehydration, cardiac issues, iatrogenic due to meds etc. Lightheadedness, fainting, fatigue, blurry vision, weakness etc can happen as symptoms. Patient should drink plenty of water, wear compression stockings, exercise, liberal salts, elevate the head end of the bed etc. Fludrocortisone, Midodrine, droxidopa etc. Can be tried in certain patients.   PAIN:  Are you having pain? No  PRECAUTIONS: Fall  RED FLAGS: None   WEIGHT BEARING RESTRICTIONS: No  FALLS: Has patient fallen in last 6 months? No  LIVING ENVIRONMENT: Lives with: Wife Lives in: House/apartment Stairs: Yes: External: 5 steps; can reach both Has following equipment at home: Single point cane and Walker - 2 wheeled  PLOF: Independent  PATIENT GOALS: I want to be stronger and more steady  OBJECTIVE:   DIAGNOSTIC FINDINGS: CLINICAL DATA:  Provided history: Mild cognitive impairment with memory loss.   EXAM: MRI HEAD WITHOUT CONTRAST   TECHNIQUE: Multiplanar, multiecho pulse sequences of the brain and surrounding structures were obtained without intravenous contrast.   COMPARISON:  Head CT 07/31/2021.   FINDINGS: Brain:   Mild-to-moderate generalized cerebral atrophy. Commensurate prominence of the ventricles and sulci. Comparatively mild cerebellar atrophy.   Multifocal T2 FLAIR hyperintense signal abnormality within the cerebral white matter and pons, nonspecific but compatible with moderate chronic small vessel  ischemic disease.   Mild chronic small vessel ischemic changes within the thalami.   There is no acute infarct.   No evidence of an intracranial mass.   No chronic intracranial blood products.   No extra-axial fluid collection.   No midline shift.   Vascular: Maintained flow voids within the proximal large arterial vessels.   Skull and upper cervical spine: No focal suspicious marrow lesion. Degenerative changes and ligamentous hypertrophy at the C1-C2 articulation.   Sinuses/Orbits: No mass or acute finding within the imaged orbits. Prior right ocular lens replacement. Mild mucosal thickening within the bilateral ethmoid and right sphenoid sinuses.   Other: Small-volume fluid within the right mastoid air cells.   IMPRESSION: 1. No evidence of acute intracranial abnormality. 2. Moderate chronic small vessel ischemic changes within the cerebral white matter and pons. 3. Mild chronic small-vessel ischemic changes within the thalami. 4. Mild-to-moderate generalized cerebral atrophy. Comparatively mild cerebellar atrophy. 5. Mild mucosal thickening within the bilateral ethmoid and right sphenoid sinuses. 6. Small-volume fluid within the right mastoid air cells.     Electronically Signed   By: Jackey Loge D.O.   On: 11/11/2021 17:53    COGNITION: Overall cognitive status: Impaired   SENSATION: Light touch: Impaired   COORDINATION: Some difficulty sequencing   POSTURE: forward head   LOWER EXTREMITY MMT:    MMT Right Eval Left Eval  Hip flexion 3+ 4  Hip extension 4 4  Hip abduction 4 4  Hip adduction 4 4  Hip internal rotation 4 4  Hip external rotation 4 4  Knee flexion 4 4  Knee extension 4 4  Ankle dorsiflexion    Ankle plantarflexion    Ankle inversion    Ankle eversion    (Blank rows = not tested)    TRANSFERS: Assistive device utilized: None  Sit to stand: Complete Independence Stand to sit: Complete Independence Chair to chair:  Complete Independence Floor:  Not tested  GAIT: Gait pattern: step through pattern, decreased arm swing- Right, decreased arm swing- Left, decreased step length- Right, and decreased step length- Left Distance walked: 100+ feet Assistive device utilized: None Level of assistance:  SBA Comments: Shuffling gait  FUNCTIONAL TESTS:  5 times sit to stand: 18.48 sec without UE Support  Timed up and go (TUG): 16.42 sec without UE support 6 minute walk test: To be tested 2nd visit 10 meter walk test: 14.84 and 14.62 sec  = 0.69 m/s avg without UE support Berg Balance Scale: 43/56  PATIENT SURVEYS:  FOTO 49 with goal of 60  TODAY'S TREATMENT:                                                                                                                              DATE: 03/02/23    NMR: (all activities performed with CGA, gait belt)  Forward step over 1/2 foam x 2 and 2  orange hurdles - x 5 trials focusing on increasing step height Dynamic high knee marching (// bars) down and back x 10- focusing on raising knees vs. shuffling  Obstacle course - stepping over canes, 1/2 foam, up and onto airex pad onto next pad- 10 dynamic marching then figure 8 around cones- and back x 2 trials Combination movement consist of STS without UE support then step forward and to right - reach overhead with right arm and touch wall then repeat on left then sit back down.  Standing thoracic rotation- Open/closed book- x 15 reps (VC for technique)  Resistive gait 5# AW x 300 feet (shuffling gait with VC to take larger step- several instances of forward lean and out of control walker requiring CGA to recover)   Side stepping  step over 1/2 foam x 2 and 2  orange hurdles - x 5 trials focusing on increasing step height  THEREX:  Diagonal chopping (matrix cable) 7.5# 2 sets of 12 reps each UE Calf raises on 1/2 foam 2 x 12 reps with 3 sec hold Toe raises on 1/2 foam 2 x 12 reps with 3 sec hold Scap retract (single  arm) 1 set of 12 with 7.5# and 1 set of 12.5# x 10 Donkey kick x 12 reps  PATIENT EDUCATION: Education details: Purpose of PT and plan of care Person educated: Patient Education method: Explanation Education comprehension: verbalized understanding  HOME EXERCISE PROGRAM: To be initiated next 1-2 visits  GOALS: Goals reviewed with patient? Yes  SHORT TERM GOALS: Target date: 04/05/2023  Pt will be independent with HEP in order to improve strength and balance in order to decrease fall risk and improve function at home and work.   Baseline: EVAL- No formal HEP In place Goal status: INITIAL   LONG TERM GOALS: Target date: 05/17/2023  1.  Patient (> 50 years old) will complete five times sit to stand test in < 15 seconds indicating an increased LE strength and improved balance. Baseline: EVAL= 18.48 sec without UE support Goal status: INITIAL  2.  Patient will increase FOTO score to equal to or greater than  49   to demonstrate statistically significant improvement in mobility and quality of life.  Baseline: EVAL=49 Goal status: INITIAL   3.  Patient will increase Berg Balance score by > 6 points to demonstrate decreased fall risk during functional activities. Baseline: EVAL=43/56 Goal status: INITIAL   4.  Patient will reduce timed up and go to <11 seconds to reduce fall risk and demonstrate improved transfer/gait ability. Baseline: EVAL=16.42 Goal status: INITIAL  5.  Patient will increase 10 meter walk test to >1.73m/s as to improve gait speed for better community ambulation and to reduce fall risk. Baseline: EVAL= 0.69 Goal status: INITIAL  6. Patient will increase six minute walk test distance to >1000 for progression to community ambulator and improve gait ability Baseline: 02/22/2023 Goal status: INITIAL   ASSESSMENT:  CLINICAL IMPRESSION: Treatment continued per plan of care- focusing on exercises to assist with decreasing shuffling. He performed well with step  over activities and postural strengthening- very responsive to any VC to improve technique. Some difficulty with clearing feet today yet did improve with practice. Need to perform 6 min walk test next visit. Patient will benefit from skilled PT services to address his weakness and imbalance for improved quality of life and decreased risk of falling.   OBJECTIVE IMPAIRMENTS: Abnormal gait, decreased activity tolerance, decreased balance, decreased cognition, decreased coordination, decreased endurance, decreased mobility, difficulty walking, decreased ROM, decreased strength, hypomobility, and postural dysfunction.   ACTIVITY LIMITATIONS: carrying, lifting, bending, standing, squatting, and stairs  PARTICIPATION LIMITATIONS: cleaning, shopping, community activity, and yard work  PERSONAL FACTORS: Age and 3+ comorbidities: HTN, Vertigo, arthritis  are also affecting patient's functional outcome.   REHAB POTENTIAL: Good  CLINICAL DECISION MAKING: Stable/uncomplicated  EVALUATION COMPLEXITY: Moderate  PLAN:  PT FREQUENCY: 1-2x/week  PT DURATION: 12 weeks  PLANNED INTERVENTIONS: Therapeutic exercises, Therapeutic activity, Neuromuscular re-education, Balance training, Gait training, Patient/Family education, Self Care, Joint mobilization, Joint manipulation, Stair training, Vestibular training, Canalith repositioning, Orthotic/Fit training, DME instructions, Dry Needling, Spinal manipulation, Spinal mobilization, Cryotherapy, Moist heat, Taping, Manual therapy, and Re-evaluation  PLAN FOR NEXT SESSION: 6 min walk,  Progress balance and therex - add to HEP as appropriate.    Lenda Kelp, PT 03/02/2023, 11:06 AM

## 2023-03-07 NOTE — Therapy (Unsigned)
OUTPATIENT PHYSICAL THERAPY NEURO TREATMENT   Patient Name: Carlos Mathis MRN: [id number] DOB:May 09, 1941, 82 y.o., male Today's Date: 03/08/2023   PCP: Dr. Einar Crow REFERRING PROVIDER: Dr. Cristopher Peru  END OF SESSION:  PT End of Session - 03/08/23 0950     Visit Number 4    Number of Visits 24    Date for PT Re-Evaluation 05/17/23    Progress Note Due on Visit 10    PT Start Time 0848    PT Stop Time 0929    PT Time Calculation (min) 41 min    Equipment Utilized During Treatment Gait belt    Activity Tolerance Patient tolerated treatment well    Behavior During Therapy WFL for tasks assessed/performed                Past Medical History:  Diagnosis Date   Arthritis    BPH (benign prostatic hyperplasia)    CKD (chronic kidney disease), stage III (HCC)    Coronary artery disease    GERD (gastroesophageal reflux disease)    History of hiatal hernia    HLD (hyperlipidemia)    Hypertension    Long term current use of antithrombotics/antiplatelets    a.) DAPT therapy (ASA+ ticagrelor)   MCI (mild cognitive impairment) with memory loss    a.) takes apoaequorin   Migraines    OSA on CPAP    Pneumonia    Skin cancer, basal cell    STEMI (ST elevation myocardial infarction) (HCC) 07/27/2020   a.) MI while living in [place]; underwent PCI placing 4.0 x 18 mm and 3.5 x 38 mm Resolute Onyx DES (vessels unknown/unspecified).   T2DM (type 2 diabetes mellitus) (HCC)    Vertigo    Wears dentures    Full upper, partial lower   Wears hearing aid in both ears    Past Surgical History:  Procedure Laterality Date   CARDIAC CATHETERIZATION     CATARACT EXTRACTION Right    CATARACT EXTRACTION W/PHACO Left 05/03/2022   Procedure: CATARACT EXTRACTION PHACO AND INTRAOCULAR LENS PLACEMENT (IOC) LEFT DIABETIC;  Surgeon: Galen Manila, MD;  Location: [facility];  Service: Ophthalmology;  Laterality: Left;  14.77 1:24.0   HERNIA REPAIR Left    inguinal    INSERTION OF MESH  09/01/2021   Procedure: INSERTION OF MESH;  Surgeon: Carolan Shiver, MD;  Location: [facility];  Service: General;;   JOINT REPLACEMENT     tka   UMBILICAL HERNIA REPAIR     Patient Active Problem List   Diagnosis Date Noted   Acute myocardial infarction, unspecified (HCC) 11/16/2022   Anxiety state 11/16/2022   Balanitis 11/16/2022   Benign paroxysmal positional vertigo 11/16/2022   Cardiac dysrhythmia 11/16/2022   Chronic ischemic heart disease 11/16/2022   Chronic sinusitis 11/16/2022   Gastroesophageal reflux disease 11/16/2022   Headache 11/16/2022   Counseling, unspecified 11/16/2022   History of occlusive disease of artery of lower extremity 11/16/2022   Hypertensive chronic kidney disease with stage 1 through stage 4 chronic kidney disease, or unspecified chronic kidney disease 11/16/2022   Irritable bowel syndrome 11/16/2022   Unspecified dementia, mild, without behavioral disturbance, psychotic disturbance, mood disturbance, and anxiety (HCC) 11/16/2022   Obstructive sleep apnea (adult) (pediatric) 11/16/2022   Osteoarthritis of knee 11/16/2022   Personal history of tuberculosis 11/16/2022   Phimosis 11/16/2022   Restless legs 11/16/2022   Sensorineural hearing loss, bilateral 11/16/2022   Type 2 diabetes mellitus without complications (HCC) 11/16/2022   Diabetic polyneuropathy associated  with type 2 diabetes mellitus (HCC) 06/24/2021   Healthcare maintenance 06/24/2021   CAD (coronary artery disease) 09/02/2020   Pure hypercholesterolemia 09/02/2020   Polycystic kidney 02/11/2020   Benign prostatic hyperplasia 02/11/2020   Essential hypertension 02/11/2020   Acquired cyst of kidney 07/09/2019   Spinal stenosis of lumbar region 05/30/2019   Cervical spondylosis 03/12/2019   Dysphagia 03/26/2015   Hypermetropia 02/04/2015   Presbyopia 02/04/2015   Senile nuclear cataract 02/04/2015   Epistaxis 09/23/2014   Laryngopharyngeal reflux (LPR)  09/23/2014    ONSET DATE: worse since Jan 2024  REFERRING DIAG:  Diagnosis  G20.A1 (ICD-10-CM) - Parkinson's disease    THERAPY DIAG:  No diagnosis found.  Rationale for Evaluation and Treatment: Rehabilitation  SUBJECTIVE:                                                                                                                                                                                             SUBJECTIVE STATEMENT: Patient reports he is not happy his car did not turn out the way he wanted after waiting along time and paying a lot to get if fixed up. Pt overall experiencing increased fatigue this date.   Pt accompanied by: self  PERTINENT HISTORY:   Per neurology note submitted in chart by Janice Coffin, PA.  1. 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 Memory loss starting 2018 with cognitive decline over 2022. Patient reports difficulty remembering conversations, difficulty recognizing old friends, getting lost while driving. Per spouse patient can become aggressive behind the wheel. Patient reports visual hallucinations (dark areas out of his periphery) and audio hallucinations. Wife assists with medications and finances. Has become more emotional over the last year and sometimes becomes angry with spouse which is unusual. Denies alcohol use or difficulty sleeping. Per wife significant procrastiantion, low motivation, more hunched forward posture, balance issues, and gets going walking and has a difficult time stopping.   Patient was cleared to drive per driving assessment, however, we discussed that memory may not keep up with the physical ability to drive if condition continues to get worse   Ambulatory Referral to Physical Therapy [facility])   2. Peripheral neuropathy- decreased  sensation with some pain in the feet in a patient with history of agent orange exposure.  3. Orthostatic Lightheadedness/Vertigo, chronic episodic vertigo  Orthostatic hypotension (neurogenic) can be seen in many conditions such as parkinsonism, diabetic neuropathy, pure autonomic failure etc. Patient should have work up to rule out other causes  first such as dehydration, cardiac issues, iatrogenic due to meds etc. Lightheadedness, fainting, fatigue, blurry vision, weakness etc can happen as symptoms. Patient should drink plenty of water, wear compression stockings, exercise, liberal salts, elevate the head end of the bed etc. Fludrocortisone, Midodrine, droxidopa etc. Can be tried in certain patients.   PAIN:  Are you having pain? No  PRECAUTIONS: Fall  RED FLAGS: None   WEIGHT BEARING RESTRICTIONS: No  FALLS: Has patient fallen in last 6 months? No  LIVING ENVIRONMENT: Lives with: Wife Lives in: House/apartment Stairs: Yes: External: 5 steps; can reach both Has following equipment at home: Single point cane and Walker - 2 wheeled  PLOF: Independent  PATIENT GOALS: I want to be stronger and more steady  OBJECTIVE:   DIAGNOSTIC FINDINGS: CLINICAL DATA:  Provided history: Mild cognitive impairment with memory loss.   EXAM: MRI HEAD WITHOUT CONTRAST   TECHNIQUE: Multiplanar, multiecho pulse sequences of the brain and surrounding structures were obtained without intravenous contrast.   COMPARISON:  Head CT 07/31/2021.   FINDINGS: Brain:   Mild-to-moderate generalized cerebral atrophy. Commensurate prominence of the ventricles and sulci. Comparatively mild cerebellar atrophy.   Multifocal T2 FLAIR hyperintense signal abnormality within the cerebral white matter and pons, nonspecific but compatible with moderate chronic small vessel ischemic disease.   Mild chronic small vessel ischemic changes within the thalami.   There is no acute infarct.   No evidence of an  intracranial mass.   No chronic intracranial blood products.   No extra-axial fluid collection.   No midline shift.   Vascular: Maintained flow voids within the proximal large arterial vessels.   Skull and upper cervical spine: No focal suspicious marrow lesion. Degenerative changes and ligamentous hypertrophy at the C1-C2 articulation.   Sinuses/Orbits: No mass or acute finding within the imaged orbits. Prior right ocular lens replacement. Mild mucosal thickening within the bilateral ethmoid and right sphenoid sinuses.   Other: Small-volume fluid within the right mastoid air cells.   IMPRESSION: 1. No evidence of acute intracranial abnormality. 2. Moderate chronic small vessel ischemic changes within the cerebral white matter and pons. 3. Mild chronic small-vessel ischemic changes within the thalami. 4. Mild-to-moderate generalized cerebral atrophy. Comparatively mild cerebellar atrophy. 5. Mild mucosal thickening within the bilateral ethmoid and right sphenoid sinuses. 6. Small-volume fluid within the right mastoid air cells.     Electronically Signed   By: Jackey Loge D.O.   On: 11/11/2021 17:53    COGNITION: Overall cognitive status: Impaired   SENSATION: Light touch: Impaired   COORDINATION: Some difficulty sequencing   POSTURE: forward head   LOWER EXTREMITY MMT:    MMT Right Eval Left Eval  Hip flexion 3+ 4  Hip extension 4 4  Hip abduction 4 4  Hip adduction 4 4  Hip internal rotation 4 4  Hip external rotation 4 4  Knee flexion 4 4  Knee extension 4 4  Ankle dorsiflexion    Ankle plantarflexion    Ankle inversion    Ankle eversion    (Blank rows = not tested)    TRANSFERS: Assistive device utilized: None  Sit to stand: Complete Independence Stand to sit: Complete Independence Chair to chair: Complete Independence Floor:  Not tested  GAIT: Gait pattern: step through pattern, decreased arm swing- Right, decreased arm swing- Left,  decreased step length- Right, and decreased step length- Left Distance walked: 100+ feet Assistive device utilized: None Level of assistance: SBA Comments: Shuffling gait  FUNCTIONAL TESTS:  5  times sit to stand: 18.48 sec without UE Support  Timed up and go (TUG): 16.42 sec without UE support 6 minute walk test: To be tested 2nd visit 10 meter walk test: 14.84 and 14.62 sec  = 0.69 m/s avg without UE support Berg Balance Scale: 43/56  PATIENT SURVEYS:  FOTO 49 with goal of 16  TODAY'S TREATMENT:                                                                                                                              DATE: 03/08/23  TE: 6WMT 575 ft in 5 min ( increased shuffling throughout, required rest secondary to fatigue)   NMR: (all activities performed with CGA, gait belt)  Donned 2.5# AW Forward walk over 1/2 foam and onver airex x 4 laps, x 2 laps of no UE assist Dynamic high knee marching (// bars) 2 x 10   Doffed AW:   PWR! UP, rock and twist in sitting position  - PWR! Up works on postural strengthening and antigravity extension, PRW! Rock working on [employer] shifting, PRW! Twist targeting trunk rotation. PWR! Moves target bradykinesia, rigidity, and dyskinesia through targeted functional movements that address core movement difficulties for people with Parkinson's disease.   THEREX:  Calf raises on 1/2 foam 2 x 12 reps with 2.5 #  Toe raises on 1/2 foam 2 x 12 reps with 2.5# AW   PATIENT EDUCATION: Education details: Purpose of PT and plan of care Person educated: Patient Education method: Explanation Education comprehension: verbalized understanding  HOME EXERCISE PROGRAM: To be initiated next 1-2 visits  GOALS: Goals reviewed with patient? Yes  SHORT TERM GOALS: Target date: 04/05/2023  Pt will be independent with HEP in order to improve strength and balance in order to decrease fall risk and improve function at home and work.   Baseline:  EVAL- No formal HEP In place Goal status: INITIAL   LONG TERM GOALS: Target date: 05/17/2023  1.  Patient (> 25 years old) will complete five times sit to stand test in < 15 seconds indicating an increased LE strength and improved balance. Baseline: EVAL= 18.48 sec without UE support Goal status: INITIAL  2.  Patient will increase FOTO score to equal to or greater than  49   to demonstrate statistically significant improvement in mobility and quality of life.  Baseline: EVAL=49 Goal status: INITIAL   3.  Patient will increase Berg Balance score by > 6 points to demonstrate decreased fall risk during functional activities. Baseline: EVAL=43/56 Goal status: INITIAL   4.  Patient will reduce timed up and go to <11 seconds to reduce fall risk and demonstrate improved transfer/gait ability. Baseline: EVAL=16.42 Goal status: INITIAL  5.  Patient will increase 10 meter walk test to >1.18m/s as to improve gait speed for better community ambulation and to reduce fall risk. Baseline: EVAL= 0.69 Goal status: INITIAL  6. Patient will increase six minute walk test distance to >  1000 for progression to community ambulator and improve gait ability Baseline:03/08/23: 575 ft with progressive shuffling and flexion posture throughout.  Goal status: INITIAL   ASSESSMENT:  CLINICAL IMPRESSION: Treatment continued per plan of care- copmleted 6 minute walk test and pt demonstrates significant impairments with this resulting in ending test at 5 min, well below age matched norms for safe community Ambulators. Pt overall experiencing increased fatigue this date per his report and occasional spells of vertigo/ dizziness that resolved quickly. Pt introduced to PWR! Moves and was able to complete these well and appropriately. My consider future progression of these moves as pt tolerates and as his treatment plan indicates. Pt will continue to benefit from skilled physical therapy intervention to address  impairments, improve QOL, and attain therapy goals.    OBJECTIVE IMPAIRMENTS: Abnormal gait, decreased activity tolerance, decreased balance, decreased cognition, decreased coordination, decreased endurance, decreased mobility, difficulty walking, decreased ROM, decreased strength, hypomobility, and postural dysfunction.   ACTIVITY LIMITATIONS: carrying, lifting, bending, standing, squatting, and stairs  PARTICIPATION LIMITATIONS: cleaning, shopping, community activity, and yard work  PERSONAL FACTORS: Age and 3+ comorbidities: HTN, Vertigo, arthritis  are also affecting patient's functional outcome.   REHAB POTENTIAL: Good  CLINICAL DECISION MAKING: Stable/uncomplicated  EVALUATION COMPLEXITY: Moderate  PLAN:  PT FREQUENCY: 1-2x/week  PT DURATION: 12 weeks  PLANNED INTERVENTIONS: Therapeutic exercises, Therapeutic activity, Neuromuscular re-education, Balance training, Gait training, Patient/Family education, Self Care, Joint mobilization, Joint manipulation, Stair training, Vestibular training, Canalith repositioning, Orthotic/Fit training, DME instructions, Dry Needling, Spinal manipulation, Spinal mobilization, Cryotherapy, Moist heat, Taping, Manual therapy, and Re-evaluation  PLAN FOR NEXT SESSION:  Progress balance and therex - add to HEP as appropriate.    Norman Herrlich, PT 03/08/2023, 9:50 AM

## 2023-03-08 ENCOUNTER — Ambulatory Visit: Payer: Medicare Other | Admitting: Physical Therapy

## 2023-03-08 DIAGNOSIS — R262 Difficulty in walking, not elsewhere classified: Secondary | ICD-10-CM

## 2023-03-08 DIAGNOSIS — R2689 Other abnormalities of gait and mobility: Secondary | ICD-10-CM

## 2023-03-08 DIAGNOSIS — R269 Unspecified abnormalities of gait and mobility: Secondary | ICD-10-CM

## 2023-03-08 DIAGNOSIS — R2681 Unsteadiness on feet: Secondary | ICD-10-CM

## 2023-03-08 DIAGNOSIS — M6281 Muscle weakness (generalized): Secondary | ICD-10-CM

## 2023-03-10 ENCOUNTER — Ambulatory Visit: Payer: Medicare Other

## 2023-03-10 DIAGNOSIS — R278 Other lack of coordination: Secondary | ICD-10-CM

## 2023-03-10 DIAGNOSIS — M6281 Muscle weakness (generalized): Secondary | ICD-10-CM

## 2023-03-10 DIAGNOSIS — R269 Unspecified abnormalities of gait and mobility: Secondary | ICD-10-CM | POA: Diagnosis not present

## 2023-03-10 DIAGNOSIS — G20B1 Parkinson's disease with dyskinesia, without mention of fluctuations: Secondary | ICD-10-CM

## 2023-03-10 DIAGNOSIS — R2681 Unsteadiness on feet: Secondary | ICD-10-CM

## 2023-03-10 DIAGNOSIS — R2689 Other abnormalities of gait and mobility: Secondary | ICD-10-CM

## 2023-03-10 DIAGNOSIS — R262 Difficulty in walking, not elsewhere classified: Secondary | ICD-10-CM

## 2023-03-10 NOTE — Therapy (Signed)
OUTPATIENT PHYSICAL THERAPY NEURO TREATMENT   Patient Name: Carlos Mathis MRN: [id number] DOB:15-Jan-1941, 82 y.o., male Today's Date: 03/10/2023   PCP: Dr. Einar Mathis REFERRING PROVIDER: Dr. Cristopher Mathis  END OF SESSION:  PT End of Session - 03/10/23 0850     Visit Number 5    Number of Visits 24    Date for PT Re-Evaluation 05/17/23    Progress Note Due on Visit 10    PT Start Time [phone]    Equipment Utilized During Treatment Gait belt    Activity Tolerance Patient tolerated treatment well    Behavior During Therapy WFL for tasks assessed/performed                 Past Medical History:  Diagnosis Date   Arthritis    BPH (benign prostatic hyperplasia)    CKD (chronic kidney disease), stage III (HCC)    Coronary artery disease    GERD (gastroesophageal reflux disease)    History of hiatal hernia    HLD (hyperlipidemia)    Hypertension    Long term current use of antithrombotics/antiplatelets    a.) DAPT therapy (ASA+ ticagrelor)   MCI (mild cognitive impairment) with memory loss    a.) takes apoaequorin   Migraines    OSA on CPAP    Pneumonia    Skin cancer, basal cell    STEMI (ST elevation myocardial infarction) (HCC) 07/27/2020   a.) MI while living in [place]; underwent PCI placing 4.0 x 18 mm and 3.5 x 38 mm Resolute Onyx DES (vessels unknown/unspecified).   T2DM (type 2 diabetes mellitus) (HCC)    Vertigo    Wears dentures    Full upper, partial lower   Wears hearing aid in both ears    Past Surgical History:  Procedure Laterality Date   CARDIAC CATHETERIZATION     CATARACT EXTRACTION Right    CATARACT EXTRACTION W/PHACO Left 05/03/2022   Procedure: CATARACT EXTRACTION PHACO AND INTRAOCULAR LENS PLACEMENT (IOC) LEFT DIABETIC;  Surgeon: Carlos Manila, MD;  Location: [facility];  Service: Ophthalmology;  Laterality: Left;  14.77 1:24.0   HERNIA REPAIR Left    inguinal   INSERTION OF MESH  09/01/2021   Procedure: INSERTION OF  MESH;  Surgeon: Carlos Shiver, MD;  Location: [facility];  Service: General;;   JOINT REPLACEMENT     tka   UMBILICAL HERNIA REPAIR     Patient Active Problem List   Diagnosis Date Noted   Acute myocardial infarction, unspecified (HCC) 11/16/2022   Anxiety state 11/16/2022   Balanitis 11/16/2022   Benign paroxysmal positional vertigo 11/16/2022   Cardiac dysrhythmia 11/16/2022   Chronic ischemic heart disease 11/16/2022   Chronic sinusitis 11/16/2022   Gastroesophageal reflux disease 11/16/2022   Headache 11/16/2022   Counseling, unspecified 11/16/2022   History of occlusive disease of artery of lower extremity 11/16/2022   Hypertensive chronic kidney disease with stage 1 through stage 4 chronic kidney disease, or unspecified chronic kidney disease 11/16/2022   Irritable bowel syndrome 11/16/2022   Unspecified dementia, mild, without behavioral disturbance, psychotic disturbance, mood disturbance, and anxiety (HCC) 11/16/2022   Obstructive sleep apnea (adult) (pediatric) 11/16/2022   Osteoarthritis of knee 11/16/2022   Personal history of tuberculosis 11/16/2022   Phimosis 11/16/2022   Restless legs 11/16/2022   Sensorineural hearing loss, bilateral 11/16/2022   Type 2 diabetes mellitus without complications (HCC) 11/16/2022   Diabetic polyneuropathy associated with type 2 diabetes mellitus (HCC) 06/24/2021   Healthcare maintenance 06/24/2021   CAD (  coronary artery disease) 09/02/2020   Pure hypercholesterolemia 09/02/2020   Polycystic kidney 02/11/2020   Benign prostatic hyperplasia 02/11/2020   Essential hypertension 02/11/2020   Acquired cyst of kidney 07/09/2019   Spinal stenosis of lumbar region 05/30/2019   Cervical spondylosis 03/12/2019   Dysphagia 03/26/2015   Hypermetropia 02/04/2015   Presbyopia 02/04/2015   Senile nuclear cataract 02/04/2015   Epistaxis 09/23/2014   Laryngopharyngeal reflux (LPR) 09/23/2014    ONSET DATE: worse since Jan  2024  REFERRING DIAG:  Diagnosis  G20.A1 (ICD-10-CM) - Parkinson's disease    THERAPY DIAG:  Abnormality of gait and mobility  Difficulty in walking, not elsewhere classified  Muscle weakness (generalized)  Unsteadiness on feet  Other abnormalities of gait and mobility  Other lack of coordination  Parkinson's disease with dyskinesia, unspecified whether manifestations fluctuate  Rationale for Evaluation and Treatment: Rehabilitation  SUBJECTIVE:                                                                                                                                                                                             SUBJECTIVE STATEMENT: Patient reports feeling "blah" today and having some right wrist pain.    Pt accompanied by: self  PERTINENT HISTORY:   Per neurology note submitted in chart by Carlos Coffin, PA.  1. 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 Memory loss starting 2018 with cognitive decline over 2022. Patient reports difficulty remembering conversations, difficulty recognizing old friends, getting lost while driving. Per spouse patient can become aggressive behind the wheel. Patient reports visual hallucinations (dark areas out of his periphery) and audio hallucinations. Wife assists with medications and finances. Has become more emotional over the last year and sometimes becomes angry with spouse which is unusual. Denies alcohol use or difficulty sleeping. Per wife significant procrastiantion, low motivation, more hunched forward posture, balance issues, and gets going walking and has a difficult time stopping.   Patient was cleared to drive per driving assessment, however, we discussed that memory may not keep up with the physical ability to drive if condition  continues to get worse   Ambulatory Referral to Physical Therapy Carlos Mathis - Carlos Mathis)   2. Peripheral neuropathy- decreased sensation with some pain in the feet in a patient with history of agent orange exposure.  3. Orthostatic Lightheadedness/Vertigo, chronic episodic vertigo  Orthostatic hypotension (neurogenic) can be seen in many conditions such as parkinsonism, diabetic neuropathy, pure autonomic failure etc. Patient should have work up to rule out other causes first  such as dehydration, cardiac issues, iatrogenic due to meds etc. Lightheadedness, fainting, fatigue, blurry vision, weakness etc can happen as symptoms. Patient should drink plenty of water, wear compression stockings, exercise, liberal salts, elevate the head end of the bed etc. Fludrocortisone, Midodrine, droxidopa etc. Can be tried in certain patients.   PAIN:  Are you having pain? No  PRECAUTIONS: Fall  RED FLAGS: None   WEIGHT BEARING RESTRICTIONS: No  FALLS: Has patient fallen in last 6 months? No  LIVING ENVIRONMENT: Lives with: Wife Lives in: House/apartment Stairs: Yes: External: 5 steps; can reach both Has following equipment at home: Single point cane and Walker - 2 wheeled  PLOF: Independent  PATIENT GOALS: I want to be stronger and more steady  OBJECTIVE:   DIAGNOSTIC FINDINGS: CLINICAL DATA:  Provided history: Mild cognitive impairment with memory loss.   EXAM: MRI HEAD WITHOUT CONTRAST   TECHNIQUE: Multiplanar, multiecho pulse sequences of the brain and surrounding structures were obtained without intravenous contrast.   COMPARISON:  Head CT 07/31/2021.   FINDINGS: Brain:   Mild-to-moderate generalized cerebral atrophy. Commensurate prominence of the ventricles and sulci. Comparatively mild cerebellar atrophy.   Multifocal T2 FLAIR hyperintense signal abnormality within the cerebral white matter and pons, nonspecific but compatible with moderate chronic small vessel ischemic disease.    Mild chronic small vessel ischemic changes within the thalami.   There is no acute infarct.   No evidence of an intracranial mass.   No chronic intracranial blood products.   No extra-axial fluid collection.   No midline shift.   Vascular: Maintained flow voids within the proximal large arterial vessels.   Skull and upper cervical spine: No focal suspicious marrow lesion. Degenerative changes and ligamentous hypertrophy at the C1-C2 articulation.   Sinuses/Orbits: No mass or acute finding within the imaged orbits. Prior right ocular lens replacement. Mild mucosal thickening within the bilateral ethmoid and right sphenoid sinuses.   Other: Small-volume fluid within the right mastoid air cells.   IMPRESSION: 1. No evidence of acute intracranial abnormality. 2. Moderate chronic small vessel ischemic changes within the cerebral white matter and pons. 3. Mild chronic small-vessel ischemic changes within the thalami. 4. Mild-to-moderate generalized cerebral atrophy. Comparatively mild cerebellar atrophy. 5. Mild mucosal thickening within the bilateral ethmoid and right sphenoid sinuses. 6. Small-volume fluid within the right mastoid air cells.     Electronically Signed   By: Jackey Loge D.O.   On: 11/11/2021 17:53    COGNITION: Overall cognitive status: Impaired   SENSATION: Light touch: Impaired   COORDINATION: Some difficulty sequencing   POSTURE: forward head   LOWER EXTREMITY MMT:    MMT Right Eval Left Eval  Hip flexion 3+ 4  Hip extension 4 4  Hip abduction 4 4  Hip adduction 4 4  Hip internal rotation 4 4  Hip external rotation 4 4  Knee flexion 4 4  Knee extension 4 4  Ankle dorsiflexion    Ankle plantarflexion    Ankle inversion    Ankle eversion    (Blank rows = not tested)    TRANSFERS: Assistive device utilized: None  Sit to stand: Complete Independence Stand to sit: Complete Independence Chair to chair: Complete  Independence Floor:  Not tested  GAIT: Gait pattern: step through pattern, decreased arm swing- Right, decreased arm swing- Left, decreased step length- Right, and decreased step length- Left Distance walked: 100+ feet Assistive device utilized: None Level of assistance: SBA Comments: Shuffling gait  FUNCTIONAL TESTS:  5 times  sit to stand: 18.48 sec without UE Support  Timed up and go (TUG): 16.42 sec without UE support 6 minute walk test: To be tested 2nd visit 10 meter walk test: 14.84 and 14.62 sec  = 0.69 m/s avg without UE support Berg Balance Scale: 43/56  PATIENT SURVEYS:  FOTO 49 with goal of 53  TODAY'S TREATMENT:                                                                                                                              DATE: 03/10/23  TE:   NMR: (all activities performed with CGA, gait belt)  Donned 3# AW Forward walk over 1/2 foam and 3 positioned PVC pipe in // bars (focusing on increasing step height and length) x 3 rounds with min UE support and then 4 rounds down and back without UE support (only 1 instance of clipping a pvc- otherwise good foot clearance- yet mild staggering)   Side step  over 1/2 foam and 3 positioned PVC pipes in // bars x 6 down and back- Some VC for wider step and some imbalance without UE support.   Dynamic high knee march/walk down and back in bars x 5 (VC for increased height)    Static stand on airex pad with thoracic rotation and hand clap x 10 reps each direction (min increase AP sway but no LOB)   Dynamic side step on airex beam with thoracic rotation and hand clap   THEREX:  Sit to stand without UE support 2 sets Calf raises on 1/2 foam 2 x 12 reps with 3 # AW Toe raises on 1/2 foam 2 x 12 reps with 3 # AW   PATIENT EDUCATION: Education details: Purpose of PT and plan of care Person educated: Patient Education method: Explanation Education comprehension: verbalized understanding  HOME EXERCISE  PROGRAM: To be initiated next 1-2 visits  GOALS: Goals reviewed with patient? Yes  SHORT TERM GOALS: Target date: 04/05/2023  Pt will be independent with HEP in order to improve strength and balance in order to decrease fall risk and improve function at home and work.   Baseline: EVAL- No formal HEP In place Goal status: INITIAL   LONG TERM GOALS: Target date: 05/17/2023  1.  Patient (> 60 years old) will complete five times sit to stand test in < 15 seconds indicating an increased LE strength and improved balance. Baseline: EVAL= 18.48 sec without UE support Goal status: INITIAL  2.  Patient will increase FOTO score to equal to or greater than  49   to demonstrate statistically significant improvement in mobility and quality of life.  Baseline: EVAL=49 Goal status: INITIAL   3.  Patient will increase Berg Balance score by > 6 points to demonstrate decreased fall risk during functional activities. Baseline: EVAL=43/56 Goal status: INITIAL   4.  Patient will reduce timed up and go to <11 seconds to reduce fall risk and demonstrate improved transfer/gait ability.  Baseline: EVAL=16.42 Goal status: INITIAL  5.  Patient will increase 10 meter walk test to >1.68m/s as to improve gait speed for better community ambulation and to reduce fall risk. Baseline: EVAL= 0.69 Goal status: INITIAL  6. Patient will increase six minute walk test distance to >1000 for progression to community ambulator and improve gait ability Baseline:03/08/23: 575 ft with progressive shuffling and flexion posture throughout.  Goal status: INITIAL   ASSESSMENT:  CLINICAL IMPRESSION: Patient presented as fatigued today but once involved in session he performed well- minimal rest today and patient adapted to non-compliance surface mobility well today without any significant LOB. Pt will continue to benefit from skilled physical therapy intervention to address impairments, improve QOL, and attain therapy goals.     OBJECTIVE IMPAIRMENTS: Abnormal gait, decreased activity tolerance, decreased balance, decreased cognition, decreased coordination, decreased endurance, decreased mobility, difficulty walking, decreased ROM, decreased strength, hypomobility, and postural dysfunction.   ACTIVITY LIMITATIONS: carrying, lifting, bending, standing, squatting, and stairs  PARTICIPATION LIMITATIONS: cleaning, shopping, community activity, and yard work  PERSONAL FACTORS: Age and 3+ comorbidities: HTN, Vertigo, arthritis  are also affecting patient's functional outcome.   REHAB POTENTIAL: Good  CLINICAL DECISION MAKING: Stable/uncomplicated  EVALUATION COMPLEXITY: Moderate  PLAN:  PT FREQUENCY: 1-2x/week  PT DURATION: 12 weeks  PLANNED INTERVENTIONS: Therapeutic exercises, Therapeutic activity, Neuromuscular re-education, Balance training, Gait training, Patient/Family education, Self Care, Joint mobilization, Joint manipulation, Stair training, Vestibular training, Canalith repositioning, Orthotic/Fit training, DME instructions, Dry Needling, Spinal manipulation, Spinal mobilization, Cryotherapy, Moist heat, Taping, Manual therapy, and Re-evaluation  PLAN FOR NEXT SESSION:  Progress balance and therex - add to HEP as appropriate.    Lenda Kelp, PT 03/10/2023, 8:55 AM

## 2023-03-14 ENCOUNTER — Ambulatory Visit: Payer: Medicare Other

## 2023-03-16 ENCOUNTER — Ambulatory Visit: Payer: Medicare Other

## 2023-03-21 ENCOUNTER — Other Ambulatory Visit: Payer: Self-pay

## 2023-03-21 ENCOUNTER — Encounter
Admission: RE | Disposition: A | Discharge status: Home / Self Care | Payer: Self-pay | Source: Home / Self Care | Attending: Gastroenterology

## 2023-03-21 ENCOUNTER — Ambulatory Visit: Payer: Medicare Other | Admitting: Physical Therapy

## 2023-03-21 ENCOUNTER — Ambulatory Visit: Payer: Medicare Other | Admitting: Anesthesiology

## 2023-03-21 ENCOUNTER — Ambulatory Visit
Admission: RE | Admit: 2023-03-21 | Discharge: 2023-03-21 | Disposition: A | Discharge status: Home / Self Care | Payer: Medicare Other | Attending: Gastroenterology | Admitting: Gastroenterology

## 2023-03-21 ENCOUNTER — Encounter: Payer: Self-pay | Admitting: Gastroenterology

## 2023-03-21 DIAGNOSIS — I2510 Atherosclerotic heart disease of native coronary artery without angina pectoris: Secondary | ICD-10-CM | POA: Diagnosis not present

## 2023-03-21 DIAGNOSIS — K219 Gastro-esophageal reflux disease without esophagitis: Secondary | ICD-10-CM | POA: Insufficient documentation

## 2023-03-21 DIAGNOSIS — R1310 Dysphagia, unspecified: Secondary | ICD-10-CM | POA: Diagnosis present

## 2023-03-21 DIAGNOSIS — N183 Chronic kidney disease, stage 3 unspecified: Secondary | ICD-10-CM | POA: Insufficient documentation

## 2023-03-21 DIAGNOSIS — G4733 Obstructive sleep apnea (adult) (pediatric): Secondary | ICD-10-CM | POA: Insufficient documentation

## 2023-03-21 DIAGNOSIS — I129 Hypertensive chronic kidney disease with stage 1 through stage 4 chronic kidney disease, or unspecified chronic kidney disease: Secondary | ICD-10-CM | POA: Diagnosis not present

## 2023-03-21 DIAGNOSIS — K2289 Other specified disease of esophagus: Secondary | ICD-10-CM | POA: Diagnosis not present

## 2023-03-21 DIAGNOSIS — Z7984 Long term (current) use of oral hypoglycemic drugs: Secondary | ICD-10-CM | POA: Diagnosis not present

## 2023-03-21 DIAGNOSIS — E785 Hyperlipidemia, unspecified: Secondary | ICD-10-CM | POA: Diagnosis not present

## 2023-03-21 DIAGNOSIS — E1122 Type 2 diabetes mellitus with diabetic chronic kidney disease: Secondary | ICD-10-CM | POA: Diagnosis not present

## 2023-03-21 DIAGNOSIS — Z87891 Personal history of nicotine dependence: Secondary | ICD-10-CM | POA: Insufficient documentation

## 2023-03-21 DIAGNOSIS — Z7902 Long term (current) use of antithrombotics/antiplatelets: Secondary | ICD-10-CM | POA: Insufficient documentation

## 2023-03-21 DIAGNOSIS — R1319 Other dysphagia: Secondary | ICD-10-CM

## 2023-03-21 HISTORY — PX: ESOPHAGOGASTRODUODENOSCOPY (EGD) WITH PROPOFOL: SHX5813

## 2023-03-21 LAB — GLUCOSE, CAPILLARY: Glucose-Capillary: 186 mg/dL — ABNORMAL HIGH (ref 70–99)

## 2023-03-21 SURGERY — ESOPHAGOGASTRODUODENOSCOPY (EGD) WITH PROPOFOL
Anesthesia: General

## 2023-03-21 MED ORDER — LIDOCAINE HCL (CARDIAC) PF 100 MG/5ML IV SOSY
PREFILLED_SYRINGE | INTRAVENOUS | Status: DC | PRN
  Administered 2023-03-21: 60 mg via INTRAVENOUS

## 2023-03-21 MED ORDER — PROPOFOL 500 MG/50ML IV EMUL
INTRAVENOUS | Status: DC | PRN
  Administered 2023-03-21: 75 ug/kg/min via INTRAVENOUS

## 2023-03-21 MED ORDER — PROPOFOL 10 MG/ML IV BOLUS
INTRAVENOUS | Status: DC | PRN
  Administered 2023-03-21: 50 mg via INTRAVENOUS

## 2023-03-21 MED ORDER — SODIUM CHLORIDE 0.9 % IV SOLN: INTRAVENOUS | Status: DC

## 2023-03-21 NOTE — Op Note (Signed)
[facility] Gastroenterology Patient Name: Carlos Mathis Procedure Date: 03/21/2023 10:32 AM MRN: [id number] Account #: [id number] Date of Birth: April 01, 1941 Admit Type: Outpatient Age: 82 Room: [facility] ENDO ROOM 2 Gender: Male Note Status: Finalized Instrument Name: Upper Endoscope [id number] Procedure:             Upper GI endoscopy Indications:           Dysphagia Providers:             [name] MD, MD Referring MD:          Marya Amsler. Dareen Piano MD, MD (Referring MD) Medicines:             Monitored Anesthesia Care Complications:         No immediate complications. Procedure:             Pre-Anesthesia Assessment:                        - Prior to the procedure, a History and Physical was                         performed, and patient medications, allergies and                         sensitivities were reviewed. The patient's tolerance                         of previous anesthesia was reviewed.                        - The risks and benefits of the procedure and the                         sedation options and risks were discussed with the                         patient. All questions were answered and informed                         consent was obtained.                        - ASA Grade Assessment: II - A patient with mild                         systemic disease.                        After obtaining informed consent, the endoscope was                         passed under direct vision. Throughout the procedure,                         the patient's blood pressure, pulse, and oxygen                         saturations were monitored continuously. The Endoscope                         was  introduced through the mouth, and advanced to the                         third part of duodenum. The upper GI endoscopy was                         accomplished with ease. The patient tolerated the                         procedure well. Findings:      The examined  esophagus was normal. Biopsies were taken with a cold       forceps for histology.      The stomach was normal.      The examined duodenum was normal.      The cardia and gastric fundus were normal on retroflexion. Impression:            - Normal esophagus. Biopsied.                        - Normal stomach.                        - Normal examined duodenum. Recommendation:        - Await pathology results.                        - Discharge patient to home (with escort).                        - Resume previous diet.                        - Continue present medications.                        - Return to GI office as previously scheduled. Procedure Code(s):     --- Professional ---                        [phone], Esophagogastroduodenoscopy, flexible,                         transoral; with biopsy, single or multiple Diagnosis Code(s):     --- Professional ---                        R13.10, Dysphagia, unspecified CPT copyright 2022 American Medical Association. All rights reserved. The codes documented in this report are preliminary and upon coder review may  be revised to meet current compliance requirements. [name], MD [name] MD, MD 03/21/2023 10:57:04 AM This report has been signed electronically. Number of Addenda: 0 Note Initiated On: 03/21/2023 10:32 AM Estimated Blood Loss:  Estimated blood loss: none.      [facility]

## 2023-03-21 NOTE — Anesthesia Preprocedure Evaluation (Signed)
Anesthesia Evaluation  Patient identified by MRN, date of birth, ID band Patient awake    Reviewed: Allergy & Precautions, NPO status , Patient's Chart, lab work & pertinent test results  History of Anesthesia Complications Negative for: history of anesthetic complications  Airway Mallampati: II  TM Distance: >3 FB Neck ROM: Full    Dental  (+) Upper Dentures, Partial Lower, Dental Advidsory Given   Pulmonary neg pulmonary ROS, neg shortness of breath, sleep apnea and Continuous Positive Airway Pressure Ventilation , neg COPD, neg recent URI, former smoker   Pulmonary exam normal        Cardiovascular Exercise Tolerance: Good hypertension, Pt. on medications (-) angina + CAD, + Past MI and + Cardiac Stents  Normal cardiovascular exam(-) dysrhythmias  Rhythm:Regular Rate:Normal     Neuro/Psych  Headaches, neg Seizures Parkinson's dz  negative psych ROS   GI/Hepatic Neg liver ROS, hiatal hernia,GERD  ,,  Endo/Other  negative endocrine ROSdiabetes, Well Controlled, Type 2, Oral Hypoglycemic Agents    Renal/GU CRFRenal disease     Musculoskeletal  (+) Arthritis ,    Abdominal  (+) + obese  Peds negative pediatric ROS (+)  Hematology negative hematology ROS (+)   Anesthesia Other Findings Past Medical History: No date: Arthritis No date: BPH (benign prostatic hyperplasia) No date: CKD (chronic kidney disease), stage III (HCC) No date: Coronary artery disease No date: GERD (gastroesophageal reflux disease) No date: History of hiatal hernia No date: HLD (hyperlipidemia) No date: Hypertension No date: Long term current use of antithrombotics/antiplatelets     Comment:  a.) DAPT therapy (ASA+ ticagrelor) No date: MCI (mild cognitive impairment) with memory loss     Comment:  a.) takes apoaequorin No date: Migraines No date: OSA on CPAP No date: Pneumonia No date: Skin cancer, basal cell 07/27/2020: STEMI (ST  elevation myocardial infarction) (HCC)     Comment:  a.) MI while living in [place]; underwent PCI placing               4.0 x 18 mm and 3.5 x 38 mm Resolute Onyx DES (vessels               unknown/unspecified). No date: T2DM (type 2 diabetes mellitus) (HCC)  Past Surgical History: No date: CARDIAC CATHETERIZATION No date: CATARACT EXTRACTION; Right No date: HERNIA REPAIR; Left     Comment:  inguinal No date: JOINT REPLACEMENT     Comment:  tka No date: UMBILICAL HERNIA REPAIR  BMI    Body Mass Index: 25.83 kg/m      Reproductive/Obstetrics negative OB ROS                              Anesthesia Physical Anesthesia Plan  ASA: 3  Anesthesia Plan: General   Post-op Pain Management:    Induction: Intravenous  PONV Risk Score and Plan: Propofol infusion and TIVA  Airway Management Planned: Natural Airway and Nasal Cannula  Additional Equipment:   Intra-op Plan:   Post-operative Plan:   Informed Consent: I have reviewed the patients History and Physical, chart, labs and discussed the procedure including the risks, benefits and alternatives for the proposed anesthesia with the patient or authorized representative who has indicated his/her understanding and acceptance.     Dental Advisory Given  Plan Discussed with: CRNA and Surgeon  Anesthesia Plan Comments:          Anesthesia Quick Evaluation

## 2023-03-21 NOTE — Transfer of Care (Signed)
Immediate Anesthesia Transfer of Care Note  Patient: Carlos Mathis  Procedure(s) Performed: ESOPHAGOGASTRODUODENOSCOPY (EGD) WITH PROPOFOL  Patient Location: PACU  Anesthesia Type:General  Level of Consciousness: sedated  Airway & Oxygen Therapy: Patient Spontanous Breathing and Patient connected to nasal cannula oxygen  Post-op Assessment: Report given to RN and Post -op Vital signs reviewed and stable  Post vital signs: Reviewed and stable  Last Vitals:  Vitals Value Taken Time  BP 116/72 03/21/23 1059  Temp 36.1 C 03/21/23 1059  Pulse 70 03/21/23 1100  Resp 14 03/21/23 1100  SpO2 96 % 03/21/23 1100  Vitals shown include unfiled device data.  Last Pain:  Vitals:   03/21/23 1059  TempSrc: Temporal  PainSc: Asleep         Complications: No notable events documented.

## 2023-03-21 NOTE — H&P (Signed)
[name], MD [address], [address], [place], [place], [address]40 [address], [place], [place], [address] Phone: [phone]  Fax: [phone]  Primary Care Physician:  Lauro Regulus, MD   Pre-Procedure History & Physical: HPI:  Carlos Mathis is a 82 y.o. male is here for an endoscopy    Past Medical History:  Diagnosis Date   Arthritis    BPH (benign prostatic hyperplasia)    CKD (chronic kidney disease), stage III (HCC)    Coronary artery disease    GERD (gastroesophageal reflux disease)    History of hiatal hernia    HLD (hyperlipidemia)    Hypertension    Long term current use of antithrombotics/antiplatelets    a.) DAPT therapy (ASA+ ticagrelor)   MCI (mild cognitive impairment) with memory loss    a.) takes apoaequorin   Migraines    OSA on CPAP    Pneumonia    Skin cancer, basal cell    STEMI (ST elevation myocardial infarction) (HCC) 07/27/2020   a.) MI while living in [place]; underwent PCI placing 4.0 x 18 mm and 3.5 x 38 mm Resolute Onyx DES (vessels unknown/unspecified).   T2DM (type 2 diabetes mellitus) (HCC)    Vertigo    Wears dentures    Full upper, partial lower   Wears hearing aid in both ears     Past Surgical History:  Procedure Laterality Date   CARDIAC CATHETERIZATION     CATARACT EXTRACTION Right    CATARACT EXTRACTION W/PHACO Left 05/03/2022   Procedure: CATARACT EXTRACTION PHACO AND INTRAOCULAR LENS PLACEMENT (IOC) LEFT DIABETIC;  Surgeon: Galen Manila, MD;  Location: [facility];  Service: Ophthalmology;  Laterality: Left;  14.77 1:24.0   HERNIA REPAIR Left    inguinal   INSERTION OF MESH  09/01/2021   Procedure: INSERTION OF MESH;  Surgeon: Carolan Shiver, MD;  Location: [facility];  Service: General;;   JOINT REPLACEMENT     tka   UMBILICAL HERNIA REPAIR      Prior to Admission medications   Medication Sig Start Date End Date Taking? Authorizing Provider  aspirin 81 MG EC tablet Take  81 mg by mouth daily. 10/06/04   [provider]  atorvastatin (LIPITOR) 40 MG tablet Take 40 mg by mouth every evening. 07/09/19   [provider]  colchicine 0.6 MG tablet 1.2mg  x 1 then 0.6mg  1 hour later x 1. Total 1.8mg  dose / attack. 07/23/20   [provider]  diclofenac (VOLTAREN) 75 MG EC tablet     [provider]  donepezil (ARICEPT ODT) 10 MG disintegrating tablet Take 5 mg by mouth at bedtime.    [provider]  empagliflozin (JARDIANCE) 25 MG TABS tablet Take 12.5 mg by mouth every morning.    [provider]  gentamicin ointment (GARAMYCIN) 0.1 % SMARTSIG:1 Topical Daily 09/01/22   [provider]  glipiZIDE (GLUCOTROL) 10 MG tablet     [provider]  glucose blood ([employer] BLOOD GLUCOSE TEST) test strip USE 1 STRIP FOR TESTING AS DIRECTED EVERY DAY TO TEST BLOOD SUGAR 05/18/20   [provider]  glucose blood test strip USE 1 STRIP FOR TESTING AS DIRECTED EVERY DAY TO TEST BLOOD SUGAR 05/18/20   [provider]  hydrochlorothiazide (HYDRODIURIL) 12.5 MG tablet     [provider]  lidocaine (LIDODERM) 5 % Place 1 patch onto the skin daily. Remove & Discard patch within 12 hours or as directed by MD  Patient not taking: Reported on 04/26/2022 04/09/22   Georga Hacking, MD  losartan (COZAAR) 25 MG tablet Take 25 mg by mouth every evening. 08/13/20   [provider]  meclizine (ANTIVERT) 25 MG tablet Take 25 mg by mouth 3 (three) times daily as needed for dizziness.    [provider]  meloxicam (MOBIC) 7.5 MG tablet Take by mouth. 07/23/20   [provider]  metFORMIN (GLUMETZA) 500 MG (MOD) 24 hr tablet Take 500 mg by mouth every evening.    [provider]  metoprolol succinate (TOPROL-XL) 50 MG 24 hr tablet 25 mg every morning. 01/17/06   [provider]  mirabegron ER (MYRBETRIQ) 25 MG TB24 tablet TAKE ONE TABLET BY MOUTH EVERY DAY FOR  OVERACTIVE BLADDER 10/10/22   [provider]  mometasone (ELOCON) 0.1 % lotion Apply topically. 09/01/22   [provider]  Multiple Vitamin (MULTIVITAMIN) capsule Take 1 capsule by mouth daily.    [provider]  nitroGLYCERIN (NITROSTAT) 0.4 MG SL tablet Place 0.4 mg under the tongue every 5 (five) minutes as needed for chest pain. 08/13/20   [provider]  Omega-3 Fatty Acids (OMEGA-3 FISH OIL) 1200 MG CAPS Take 1,200 mg by mouth daily.    [provider]  omeprazole (PRILOSEC) 40 MG capsule Take 1 capsule (40 mg total) by mouth daily. 02/01/23   [name], MD  OVER THE COUNTER MEDICATION Take 1 capsule by mouth daily. Nerve Control 911    [provider]  rivastigmine (EXELON) 1.5 MG capsule Take 1.5 mg by mouth 2 (two) times daily. Patient not taking: Reported on 04/26/2022    [provider]  tamsulosin (FLOMAX) 0.4 MG CAPS capsule Take 0.4 mg by mouth every evening.    [provider]  triamcinolone cream (KENALOG) 0.1 % APPLY MODERATE AMOUNT TOPICALLY TWO TIMES A DAY AS NEEDED TO FORESKIN. REPLACES BETAMETHASONE 09/20/22   [provider]    Allergies as of 02/09/2023 - Review Complete 11/16/2022  Allergen Reaction Noted   Meperidine hcl Nausea Only 08/14/2008    No family history on file.  Social History   Socioeconomic History   Marital status: Married    Spouse name: Not on file   Number of children: Not on file   Years of education: Not on file   Highest education level: Not on file  Occupational History   Not on file  Tobacco Use   Smoking status: Former    Current packs/day: 0.00    Average packs/day: 1 pack/day for 20.0 years (20.0 ttl pk-yrs)    Types: Cigarettes    Start date: 09/11/1963    Quit date: 09/11/1983    Years since quitting: 39.5   Smokeless tobacco: Former    Types: Chew    Quit date: 09/11/1983  Vaping Use   Vaping status: Never Used  Substance and Sexual Activity    Alcohol use: Not Currently   Drug use: Not Currently   Sexual activity: Not on file  Other Topics Concern   Not on file  Social History Narrative   Not on file   Social Determinants of Health   Financial Resource Strain: Not on file  Food Insecurity: Not on file  Transportation Needs: Not on file  Physical Activity: Not on file  Stress: Not on file  Social Connections: Not on file  Intimate Partner Violence: Not on file    Review of Systems: See HPI, otherwise negative ROS  Physical Exam: There were  no vitals taken for this visit. General:   Alert,  pleasant and cooperative in NAD Head:  Normocephalic and atraumatic. Neck:  Supple; no masses or thyromegaly. Lungs:  Clear throughout to auscultation, normal respiratory effort.    Heart:  +S1, +S2, Regular rate and rhythm, No edema. Abdomen:  Soft, nontender and nondistended. Normal bowel sounds, without guarding, and without rebound.   Neurologic:  Alert and  oriented x4;  grossly normal neurologically.  Impression/Plan: Carlos Mathis is here for an endoscopy  to be performed for  evaluation of dysphagia    Risks, benefits, limitations, and alternatives regarding endoscopy have been reviewed with the patient.  Questions have been answered.  All parties agreeable.   [name], MD  03/21/2023, 9:53 AM

## 2023-03-22 ENCOUNTER — Encounter: Payer: Self-pay | Admitting: Gastroenterology

## 2023-03-22 LAB — SURGICAL PATHOLOGY

## 2023-03-22 NOTE — Anesthesia Postprocedure Evaluation (Signed)
Anesthesia Post Note  Patient: Carlos Mathis  Procedure(s) Performed: ESOPHAGOGASTRODUODENOSCOPY (EGD) WITH PROPOFOL  Patient location during evaluation: Endoscopy Anesthesia Type: General Level of consciousness: awake and alert Pain management: pain level controlled Vital Signs Assessment: post-procedure vital signs reviewed and stable Respiratory status: spontaneous breathing, nonlabored ventilation and respiratory function stable Cardiovascular status: blood pressure returned to baseline and stable Postop Assessment: no apparent nausea or vomiting Anesthetic complications: no   No notable events documented.   Last Vitals:  Vitals:   03/21/23 1109 03/21/23 1119  BP:  116/85  Pulse: 74 78  Resp: 15   Temp:    SpO2: 97% 98%    Last Pain:  Vitals:   03/21/23 1119  TempSrc:   PainSc: 0-No pain                 Foye Deer

## 2023-03-23 ENCOUNTER — Encounter: Payer: Self-pay | Admitting: Gastroenterology

## 2023-03-23 ENCOUNTER — Ambulatory Visit: Payer: Medicare Other | Attending: Neurology

## 2023-03-23 DIAGNOSIS — R2681 Unsteadiness on feet: Secondary | ICD-10-CM | POA: Insufficient documentation

## 2023-03-23 DIAGNOSIS — R269 Unspecified abnormalities of gait and mobility: Secondary | ICD-10-CM | POA: Insufficient documentation

## 2023-03-23 DIAGNOSIS — G20B1 Parkinson's disease with dyskinesia, without mention of fluctuations: Secondary | ICD-10-CM | POA: Insufficient documentation

## 2023-03-23 DIAGNOSIS — G20B2 Parkinson's disease with dyskinesia, with fluctuations: Secondary | ICD-10-CM | POA: Insufficient documentation

## 2023-03-23 DIAGNOSIS — R262 Difficulty in walking, not elsewhere classified: Secondary | ICD-10-CM | POA: Insufficient documentation

## 2023-03-23 DIAGNOSIS — R278 Other lack of coordination: Secondary | ICD-10-CM | POA: Insufficient documentation

## 2023-03-23 DIAGNOSIS — M6281 Muscle weakness (generalized): Secondary | ICD-10-CM | POA: Diagnosis present

## 2023-03-23 DIAGNOSIS — R2689 Other abnormalities of gait and mobility: Secondary | ICD-10-CM | POA: Diagnosis present

## 2023-03-23 NOTE — Therapy (Signed)
OUTPATIENT PHYSICAL THERAPY NEURO TREATMENT   Patient Name: Carlos Mathis MRN: [id number] DOB:1941/02/10, 82 y.o., male Today's Date: 03/24/2023   PCP: Dr. Einar Crow REFERRING PROVIDER: Dr. Cristopher Peru  END OF SESSION:  PT End of Session - 03/23/23 1314     Visit Number 6    Number of Visits 24    Date for PT Re-Evaluation 05/17/23    Progress Note Due on Visit 10    PT Start Time 1315    PT Stop Time 1359    PT Time Calculation (min) 44 min    Equipment Utilized During Treatment Gait belt    Activity Tolerance Patient tolerated treatment well    Behavior During Therapy WFL for tasks assessed/performed                 Past Medical History:  Diagnosis Date   Arthritis    BPH (benign prostatic hyperplasia)    CKD (chronic kidney disease), stage III (HCC)    Coronary artery disease    GERD (gastroesophageal reflux disease)    History of hiatal hernia    HLD (hyperlipidemia)    Hypertension    Long term current use of antithrombotics/antiplatelets    a.) DAPT therapy (ASA+ ticagrelor)   MCI (mild cognitive impairment) with memory loss    a.) takes apoaequorin   Migraines    OSA on CPAP    Pneumonia    Skin cancer, basal cell    STEMI (ST elevation myocardial infarction) (HCC) 07/27/2020   a.) MI while living in [place]; underwent PCI placing 4.0 x 18 mm and 3.5 x 38 mm Resolute Onyx DES (vessels unknown/unspecified).   T2DM (type 2 diabetes mellitus) (HCC)    Vertigo    Wears dentures    Full upper, partial lower   Wears hearing aid in both ears    Past Surgical History:  Procedure Laterality Date   CARDIAC CATHETERIZATION     CATARACT EXTRACTION Right    CATARACT EXTRACTION W/PHACO Left 05/03/2022   Procedure: CATARACT EXTRACTION PHACO AND INTRAOCULAR LENS PLACEMENT (IOC) LEFT DIABETIC;  Surgeon: Galen Manila, MD;  Location: [facility];  Service: Ophthalmology;  Laterality: Left;  14.77 1:24.0   ESOPHAGOGASTRODUODENOSCOPY (EGD)  WITH PROPOFOL N/A 03/21/2023   Procedure: ESOPHAGOGASTRODUODENOSCOPY (EGD) WITH PROPOFOL;  Surgeon: [name], MD;  Location: [facility];  Service: Gastroenterology;  Laterality: N/A;   HERNIA REPAIR Left    inguinal   INSERTION OF MESH  09/01/2021   Procedure: INSERTION OF MESH;  Surgeon: Carolan Shiver, MD;  Location: [facility];  Service: General;;   JOINT REPLACEMENT     tka   UMBILICAL HERNIA REPAIR     Patient Active Problem List   Diagnosis Date Noted   Acute myocardial infarction, unspecified (HCC) 11/16/2022   Anxiety state 11/16/2022   Balanitis 11/16/2022   Benign paroxysmal positional vertigo 11/16/2022   Cardiac dysrhythmia 11/16/2022   Chronic ischemic heart disease 11/16/2022   Chronic sinusitis 11/16/2022   Gastroesophageal reflux disease 11/16/2022   Headache 11/16/2022   Counseling, unspecified 11/16/2022   History of occlusive disease of artery of lower extremity 11/16/2022   Hypertensive chronic kidney disease with stage 1 through stage 4 chronic kidney disease, or unspecified chronic kidney disease 11/16/2022   Irritable bowel syndrome 11/16/2022   Unspecified dementia, mild, without behavioral disturbance, psychotic disturbance, mood disturbance, and anxiety (HCC) 11/16/2022   Obstructive sleep apnea (adult) (pediatric) 11/16/2022   Osteoarthritis of knee 11/16/2022   Personal history of tuberculosis 11/16/2022  Phimosis 11/16/2022   Restless legs 11/16/2022   Sensorineural hearing loss, bilateral 11/16/2022   Type 2 diabetes mellitus without complications (HCC) 11/16/2022   Diabetic polyneuropathy associated with type 2 diabetes mellitus (HCC) 06/24/2021   Healthcare maintenance 06/24/2021   CAD (coronary artery disease) 09/02/2020   Pure hypercholesterolemia 09/02/2020   Polycystic kidney 02/11/2020   Benign prostatic hyperplasia 02/11/2020   Essential hypertension 02/11/2020   Acquired cyst of kidney 07/09/2019   Spinal stenosis of lumbar  region 05/30/2019   Cervical spondylosis 03/12/2019   Dysphagia 03/26/2015   Hypermetropia 02/04/2015   Presbyopia 02/04/2015   Senile nuclear cataract 02/04/2015   Epistaxis 09/23/2014   Laryngopharyngeal reflux (LPR) 09/23/2014    ONSET DATE: worse since Jan 2024  REFERRING DIAG:  Diagnosis  G20.A1 (ICD-10-CM) - Parkinson's disease    THERAPY DIAG:  Abnormality of gait and mobility  Difficulty in walking, not elsewhere classified  Muscle weakness (generalized)  Unsteadiness on feet  Other abnormalities of gait and mobility  Other lack of coordination  Rationale for Evaluation and Treatment: Rehabilitation  SUBJECTIVE:                                                                                                                                                                                             SUBJECTIVE STATEMENT: Patient reports having a good vacation and denies any issues including no falls.   Pt accompanied by: self  PERTINENT HISTORY:   Per neurology note submitted in chart by Janice Coffin, PA.  1. 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 Memory loss starting 2018 with cognitive decline over 2022. Patient reports difficulty remembering conversations, difficulty recognizing old friends, getting lost while driving. Per spouse patient can become aggressive behind the wheel. Patient reports visual hallucinations (dark areas out of his periphery) and audio hallucinations. Wife assists with medications and finances. Has become more emotional over the last year and sometimes becomes angry with spouse which is unusual. Denies alcohol use or difficulty sleeping. Per wife significant procrastiantion, low motivation, more hunched forward posture, balance issues, and gets going walking and  has a difficult time stopping.   Patient was cleared to drive per driving assessment, however, we discussed that memory may not keep up with the physical ability to drive if condition continues to get worse   Ambulatory Referral to Physical Therapy [facility] Sine)   2. Peripheral neuropathy- decreased sensation with some pain in the feet in a patient with history of agent orange exposure.  3. Orthostatic Lightheadedness/Vertigo, chronic episodic vertigo  Orthostatic hypotension (neurogenic) can be seen in many conditions such as parkinsonism, diabetic neuropathy, pure autonomic failure etc. Patient should have work up to rule out other causes first such as dehydration, cardiac issues, iatrogenic due to meds etc. Lightheadedness, fainting, fatigue, blurry vision, weakness etc can happen as symptoms. Patient should drink plenty of water, wear compression stockings, exercise, liberal salts, elevate the head end of the bed etc. Fludrocortisone, Midodrine, droxidopa etc. Can be tried in certain patients.   PAIN:  Are you having pain? No  PRECAUTIONS: Fall  RED FLAGS: None   WEIGHT BEARING RESTRICTIONS: No  FALLS: Has patient fallen in last 6 months? No  LIVING ENVIRONMENT: Lives with: Wife Lives in: House/apartment Stairs: Yes: External: 5 steps; can reach both Has following equipment at home: Single point cane and Walker - 2 wheeled  PLOF: Independent  PATIENT GOALS: I want to be stronger and more steady  OBJECTIVE:   DIAGNOSTIC FINDINGS: CLINICAL DATA:  Provided history: Mild cognitive impairment with memory loss.   EXAM: MRI HEAD WITHOUT CONTRAST   TECHNIQUE: Multiplanar, multiecho pulse sequences of the brain and surrounding structures were obtained without intravenous contrast.   COMPARISON:  Head CT 07/31/2021.   FINDINGS: Brain:   Mild-to-moderate generalized cerebral atrophy. Commensurate prominence of the ventricles and sulci. Comparatively mild cerebellar  atrophy.   Multifocal T2 FLAIR hyperintense signal abnormality within the cerebral white matter and pons, nonspecific but compatible with moderate chronic small vessel ischemic disease.   Mild chronic small vessel ischemic changes within the thalami.   There is no acute infarct.   No evidence of an intracranial mass.   No chronic intracranial blood products.   No extra-axial fluid collection.   No midline shift.   Vascular: Maintained flow voids within the proximal large arterial vessels.   Skull and upper cervical spine: No focal suspicious marrow lesion. Degenerative changes and ligamentous hypertrophy at the C1-C2 articulation.   Sinuses/Orbits: No mass or acute finding within the imaged orbits. Prior right ocular lens replacement. Mild mucosal thickening within the bilateral ethmoid and right sphenoid sinuses.   Other: Small-volume fluid within the right mastoid air cells.   IMPRESSION: 1. No evidence of acute intracranial abnormality. 2. Moderate chronic small vessel ischemic changes within the cerebral white matter and pons. 3. Mild chronic small-vessel ischemic changes within the thalami. 4. Mild-to-moderate generalized cerebral atrophy. Comparatively mild cerebellar atrophy. 5. Mild mucosal thickening within the bilateral ethmoid and right sphenoid sinuses. 6. Small-volume fluid within the right mastoid air cells.     Electronically Signed   By: Jackey Loge D.O.   On: 11/11/2021 17:53    COGNITION: Overall cognitive status: Impaired   SENSATION: Light touch: Impaired   COORDINATION: Some difficulty sequencing   POSTURE: forward head   LOWER EXTREMITY MMT:    MMT Right Eval Left Eval  Hip flexion 3+ 4  Hip extension 4 4  Hip abduction 4 4  Hip adduction 4 4  Hip internal rotation 4 4  Hip external rotation 4 4  Knee flexion 4 4  Knee extension 4 4  Ankle dorsiflexion    Ankle plantarflexion    Ankle inversion    Ankle eversion     (Blank rows = not tested)    TRANSFERS: Assistive device utilized: None  Sit to stand: Complete Independence Stand to sit: Complete Independence Chair to chair: Complete Independence Floor:  Not tested  GAIT: Gait pattern: step through pattern, decreased arm  swing- Right, decreased arm swing- Left, decreased step length- Right, and decreased step length- Left Distance walked: 100+ feet Assistive device utilized: None Level of assistance: SBA Comments: Shuffling gait  FUNCTIONAL TESTS:  5 times sit to stand: 18.48 sec without UE Support  Timed up and go (TUG): 16.42 sec without UE support 6 minute walk test: To be tested 2nd visit 10 meter walk test: 14.84 and 14.62 sec  = 0.69 m/s avg without UE support Berg Balance Scale: 43/56  PATIENT SURVEYS:  FOTO 49 with goal of 58  TODAY'S TREATMENT:                                                                                                                              DATE: 03/24/23  TE:   NMR: (all activities performed with CGA, gait belt)  Donned 5# AW Forward walk over 1/2 foam rolls x 4 in // bars (focusing on increasing step height and length) x 6 rounds with 1 UE support   Side step  over 1/2 foam rolls x 4 in // bars x 6 down and back- Some VC for wider step  and no UE Support today.    Dynamic walking across 3 airex pad with 5# AW- down and back x 10 rounds each    Ambulation x 300 feet with 5# AW - focusing on erect posture and increased step length- Patient exhibited difficulty following VC and tendency to shuffle and lean forward - almost       THEREX:  Sit to stand without UE support 2 sets x 10 reps (VC for posture)  Calf raises on 1/2 foam 2 x 12 reps with 5 # AW Toe raises on 1/2 foam 2 x 12 reps with 5 # AW   PATIENT EDUCATION: Education details: Purpose of PT and plan of care Person educated: Patient Education method: Explanation Education comprehension: verbalized understanding  HOME EXERCISE  PROGRAM: To be initiated next 1-2 visits  GOALS: Goals reviewed with patient? Yes  SHORT TERM GOALS: Target date: 04/05/2023  Pt will be independent with HEP in order to improve strength and balance in order to decrease fall risk and improve function at home and work.   Baseline: EVAL- No formal HEP In place Goal status: INITIAL   LONG TERM GOALS: Target date: 05/17/2023  1.  Patient (> 73 years old) will complete five times sit to stand test in < 15 seconds indicating an increased LE strength and improved balance. Baseline: EVAL= 18.48 sec without UE support Goal status: INITIAL  2.  Patient will increase FOTO score to equal to or greater than  49   to demonstrate statistically significant improvement in mobility and quality of life.  Baseline: EVAL=49 Goal status: INITIAL   3.  Patient will increase Berg Balance score by > 6 points to demonstrate decreased fall risk during functional activities. Baseline: EVAL=43/56 Goal status: INITIAL   4.  Patient will reduce timed up  and go to <11 seconds to reduce fall risk and demonstrate improved transfer/gait ability. Baseline: EVAL=16.42 Goal status: INITIAL  5.  Patient will increase 10 meter walk test to >1.72m/s as to improve gait speed for better community ambulation and to reduce fall risk. Baseline: EVAL= 0.69 Goal status: INITIAL  6. Patient will increase six minute walk test distance to >1000 for progression to community ambulator and improve gait ability Baseline:03/08/23: 575 ft with progressive shuffling and flexion posture throughout.  Goal status: INITIAL   ASSESSMENT:  CLINICAL IMPRESSION: Patient presents with good motivation today. He was challenged with activities designed to pick his feet up but performed well with individualized tasks- yet difficulty converting to walking as he continues to shuffle - progressive with distance and will continue to benefit from further NMR. Pt will continue to benefit from skilled  physical therapy intervention to address impairments, improve QOL, and attain therapy goals.    OBJECTIVE IMPAIRMENTS: Abnormal gait, decreased activity tolerance, decreased balance, decreased cognition, decreased coordination, decreased endurance, decreased mobility, difficulty walking, decreased ROM, decreased strength, hypomobility, and postural dysfunction.   ACTIVITY LIMITATIONS: carrying, lifting, bending, standing, squatting, and stairs  PARTICIPATION LIMITATIONS: cleaning, shopping, community activity, and yard work  PERSONAL FACTORS: Age and 3+ comorbidities: HTN, Vertigo, arthritis  are also affecting patient's functional outcome.   REHAB POTENTIAL: Good  CLINICAL DECISION MAKING: Stable/uncomplicated  EVALUATION COMPLEXITY: Moderate  PLAN:  PT FREQUENCY: 1-2x/week  PT DURATION: 12 weeks  PLANNED INTERVENTIONS: Therapeutic exercises, Therapeutic activity, Neuromuscular re-education, Balance training, Gait training, Patient/Family education, Self Care, Joint mobilization, Joint manipulation, Stair training, Vestibular training, Canalith repositioning, Orthotic/Fit training, DME instructions, Dry Needling, Spinal manipulation, Spinal mobilization, Cryotherapy, Moist heat, Taping, Manual therapy, and Re-evaluation  PLAN FOR NEXT SESSION:  Progress balance and therex - add to HEP as appropriate.    Lenda Kelp, PT 03/24/2023, 11:30 AM

## 2023-03-27 ENCOUNTER — Ambulatory Visit: Payer: Medicare Other | Admitting: Physical Therapy

## 2023-03-28 ENCOUNTER — Ambulatory Visit: Payer: Medicare Other | Admitting: Physical Therapy

## 2023-03-28 DIAGNOSIS — R278 Other lack of coordination: Secondary | ICD-10-CM

## 2023-03-28 DIAGNOSIS — R2689 Other abnormalities of gait and mobility: Secondary | ICD-10-CM

## 2023-03-28 DIAGNOSIS — G20B1 Parkinson's disease with dyskinesia, without mention of fluctuations: Secondary | ICD-10-CM

## 2023-03-28 DIAGNOSIS — R269 Unspecified abnormalities of gait and mobility: Secondary | ICD-10-CM

## 2023-03-28 DIAGNOSIS — R262 Difficulty in walking, not elsewhere classified: Secondary | ICD-10-CM

## 2023-03-28 DIAGNOSIS — R2681 Unsteadiness on feet: Secondary | ICD-10-CM

## 2023-03-28 DIAGNOSIS — M6281 Muscle weakness (generalized): Secondary | ICD-10-CM

## 2023-03-28 NOTE — Therapy (Signed)
OUTPATIENT PHYSICAL THERAPY NEURO TREATMENT   Patient Name: Carlos Mathis MRN: [id number] DOB:07/25/1940, 82 y.o., male Today's Date: 03/28/2023   PCP: Dr. Einar Crow REFERRING PROVIDER: Dr. Cristopher Peru  END OF SESSION:  PT End of Session - 03/28/23 1406     Visit Number 7    Number of Visits 24    Date for PT Re-Evaluation 05/17/23    Progress Note Due on Visit 10    PT Start Time 1405    PT Stop Time 1445    PT Time Calculation (min) 40 min    Equipment Utilized During Treatment Gait belt    Activity Tolerance Patient tolerated treatment well    Behavior During Therapy WFL for tasks assessed/performed                 Past Medical History:  Diagnosis Date   Arthritis    BPH (benign prostatic hyperplasia)    CKD (chronic kidney disease), stage III (HCC)    Coronary artery disease    GERD (gastroesophageal reflux disease)    History of hiatal hernia    HLD (hyperlipidemia)    Hypertension    Long term current use of antithrombotics/antiplatelets    a.) DAPT therapy (ASA+ ticagrelor)   MCI (mild cognitive impairment) with memory loss    a.) takes apoaequorin   Migraines    OSA on CPAP    Pneumonia    Skin cancer, basal cell    STEMI (ST elevation myocardial infarction) (HCC) 07/27/2020   a.) MI while living in [place]; underwent PCI placing 4.0 x 18 mm and 3.5 x 38 mm Resolute Onyx DES (vessels unknown/unspecified).   T2DM (type 2 diabetes mellitus) (HCC)    Vertigo    Wears dentures    Full upper, partial lower   Wears hearing aid in both ears    Past Surgical History:  Procedure Laterality Date   CARDIAC CATHETERIZATION     CATARACT EXTRACTION Right    CATARACT EXTRACTION W/PHACO Left 05/03/2022   Procedure: CATARACT EXTRACTION PHACO AND INTRAOCULAR LENS PLACEMENT (IOC) LEFT DIABETIC;  Surgeon: Galen Manila, MD;  Location: [facility];  Service: Ophthalmology;  Laterality: Left;  14.77 1:24.0   ESOPHAGOGASTRODUODENOSCOPY (EGD)  WITH PROPOFOL N/A 03/21/2023   Procedure: ESOPHAGOGASTRODUODENOSCOPY (EGD) WITH PROPOFOL;  Surgeon: [name], MD;  Location: [facility];  Service: Gastroenterology;  Laterality: N/A;   HERNIA REPAIR Left    inguinal   INSERTION OF MESH  09/01/2021   Procedure: INSERTION OF MESH;  Surgeon: Carolan Shiver, MD;  Location: [facility] OR[facility];  Service: General;;   JOINT REPLACEMENT     tka   UMBILICAL HERNIA REPAIR     Patient Active Problem List   Diagnosis Date Noted   Acute myocardial infarction, unspecified (HCC) 11/16/2022   Anxiety state 11/16/2022   Balanitis 11/16/2022   Benign paroxysmal positional vertigo 11/16/2022   Cardiac dysrhythmia 11/16/2022   Chronic ischemic heart disease 11/16/2022   Chronic sinusitis 11/16/2022   Gastroesophageal reflux disease 11/16/2022   Headache 11/16/2022   Counseling, unspecified 11/16/2022   History of occlusive disease of artery of lower extremity 11/16/2022   Hypertensive chronic kidney disease with stage 1 through stage 4 chronic kidney disease, or unspecified chronic kidney disease 11/16/2022   Irritable bowel syndrome 11/16/2022   Unspecified dementia, mild, without behavioral disturbance, psychotic disturbance, mood disturbance, and anxiety (HCC) 11/16/2022   Obstructive sleep apnea (adult) (pediatric) 11/16/2022   Osteoarthritis of knee 11/16/2022   Personal history of tuberculosis 11/16/2022  Phimosis 11/16/2022   Restless legs 11/16/2022   Sensorineural hearing loss, bilateral 11/16/2022   Type 2 diabetes mellitus without complications (HCC) 11/16/2022   Diabetic polyneuropathy associated with type 2 diabetes mellitus (HCC) 06/24/2021   Healthcare maintenance 06/24/2021   CAD (coronary artery disease) 09/02/2020   Pure hypercholesterolemia 09/02/2020   Polycystic kidney 02/11/2020   Benign prostatic hyperplasia 02/11/2020   Essential hypertension 02/11/2020   Acquired cyst of kidney 07/09/2019   Spinal stenosis of lumbar  region 05/30/2019   Cervical spondylosis 03/12/2019   Dysphagia 03/26/2015   Hypermetropia 02/04/2015   Presbyopia 02/04/2015   Senile nuclear cataract 02/04/2015   Epistaxis 09/23/2014   Laryngopharyngeal reflux (LPR) 09/23/2014    ONSET DATE: worse since Jan 2024  REFERRING DIAG:  Diagnosis  G20.A1 (ICD-10-CM) - Parkinson's disease    THERAPY DIAG:  Abnormality of gait and mobility  Difficulty in walking, not elsewhere classified  Muscle weakness (generalized)  Unsteadiness on feet  Other abnormalities of gait and mobility  Other lack of coordination  Parkinson's disease with dyskinesia, unspecified whether manifestations fluctuate (HCC)  Rationale for Evaluation and Treatment: Rehabilitation  SUBJECTIVE:                                                                                                                                                                                             SUBJECTIVE STATEMENT: Patient reports feeling okay today. States that his feet would not move much yesterday, but is feeling much better today. Presented with recommendation for Ustep walker from [place] [facility].   Pt accompanied by: self  PERTINENT HISTORY:   Per neurology note submitted in chart by Janice Coffin, PA.  1. 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 Memory loss starting 2018 with cognitive decline over 2022. Patient reports difficulty remembering conversations, difficulty recognizing old friends, getting lost while driving. Per spouse patient can become aggressive behind the wheel. Patient reports visual hallucinations (dark areas out of his periphery) and audio hallucinations. Wife assists with medications and finances. Has become more emotional over the last year and sometimes becomes angry with  spouse which is unusual. Denies alcohol use or difficulty sleeping. Per wife significant procrastiantion, low motivation, more hunched forward posture, balance issues, and gets going walking and has a difficult time stopping.   Patient was cleared to drive per driving assessment, however, we discussed that memory may not keep up with the physical ability to drive if condition continues to get worse   Ambulatory Referral to Physical Therapy (  [facility])   2. Peripheral neuropathy- decreased sensation with some pain in the feet in a patient with history of agent orange exposure.  3. Orthostatic Lightheadedness/Vertigo, chronic episodic vertigo  Orthostatic hypotension (neurogenic) can be seen in many conditions such as parkinsonism, diabetic neuropathy, pure autonomic failure etc. Patient should have work up to rule out other causes first such as dehydration, cardiac issues, iatrogenic due to meds etc. Lightheadedness, fainting, fatigue, blurry vision, weakness etc can happen as symptoms. Patient should drink plenty of water, wear compression stockings, exercise, liberal salts, elevate the head end of the bed etc. Fludrocortisone, Midodrine, droxidopa etc. Can be tried in certain patients.   PAIN:  Are you having pain? No  PRECAUTIONS: Fall  RED FLAGS: None   WEIGHT BEARING RESTRICTIONS: No  FALLS: Has patient fallen in last 6 months? No  LIVING ENVIRONMENT: Lives with: Wife Lives in: House/apartment Stairs: Yes: External: 5 steps; can reach both Has following equipment at home: Single point cane and Walker - 2 wheeled  PLOF: Independent  PATIENT GOALS: I want to be stronger and more steady  OBJECTIVE:   DIAGNOSTIC FINDINGS: CLINICAL DATA:  Provided history: Mild cognitive impairment with memory loss.   EXAM: MRI HEAD WITHOUT CONTRAST   TECHNIQUE: Multiplanar, multiecho pulse sequences of the brain and surrounding structures were obtained without intravenous contrast.    COMPARISON:  Head CT 07/31/2021.   FINDINGS: Brain:   Mild-to-moderate generalized cerebral atrophy. Commensurate prominence of the ventricles and sulci. Comparatively mild cerebellar atrophy.   Multifocal T2 FLAIR hyperintense signal abnormality within the cerebral white matter and pons, nonspecific but compatible with moderate chronic small vessel ischemic disease.   Mild chronic small vessel ischemic changes within the thalami.   There is no acute infarct.   No evidence of an intracranial mass.   No chronic intracranial blood products.   No extra-axial fluid collection.   No midline shift.   Vascular: Maintained flow voids within the proximal large arterial vessels.   Skull and upper cervical spine: No focal suspicious marrow lesion. Degenerative changes and ligamentous hypertrophy at the C1-C2 articulation.   Sinuses/Orbits: No mass or acute finding within the imaged orbits. Prior right ocular lens replacement. Mild mucosal thickening within the bilateral ethmoid and right sphenoid sinuses.   Other: Small-volume fluid within the right mastoid air cells.   IMPRESSION: 1. No evidence of acute intracranial abnormality. 2. Moderate chronic small vessel ischemic changes within the cerebral white matter and pons. 3. Mild chronic small-vessel ischemic changes within the thalami. 4. Mild-to-moderate generalized cerebral atrophy. Comparatively mild cerebellar atrophy. 5. Mild mucosal thickening within the bilateral ethmoid and right sphenoid sinuses. 6. Small-volume fluid within the right mastoid air cells.     Electronically Signed   By: Jackey Loge D.O.   On: 11/11/2021 17:53    COGNITION: Overall cognitive status: Impaired   SENSATION: Light touch: Impaired   COORDINATION: Some difficulty sequencing   POSTURE: forward head   LOWER EXTREMITY MMT:    MMT Right Eval Left Eval  Hip flexion 3+ 4  Hip extension 4 4  Hip abduction 4 4  Hip  adduction 4 4  Hip internal rotation 4 4  Hip external rotation 4 4  Knee flexion 4 4  Knee extension 4 4  Ankle dorsiflexion    Ankle plantarflexion    Ankle inversion    Ankle eversion    (Blank rows = not tested)    TRANSFERS: Assistive device utilized: None  Sit to  stand: Complete Independence Stand to sit: Complete Independence Chair to chair: Complete Independence Floor:  Not tested  GAIT: Gait pattern: step through pattern, decreased arm swing- Right, decreased arm swing- Left, decreased step length- Right, and decreased step length- Left Distance walked: 100+ feet Assistive device utilized: None Level of assistance: SBA Comments: Shuffling gait  FUNCTIONAL TESTS:  5 times sit to stand: 18.48 sec without UE Support  Timed up and go (TUG): 16.42 sec without UE support 6 minute walk test: To be tested 2nd visit 10 meter walk test: 14.84 and 14.62 sec  = 0.69 m/s avg without UE support Berg Balance Scale: 43/56  PATIENT SURVEYS:  FOTO 49 with goal of 18  TODAY'S TREATMENT:                                                                                                                              DATE: 03/28/23  TE:    NMR: (all activities performed with CGA, gait belt)  Gait with Ustep walker x 427ft. Noted to require min cues for step length over laser and improved posture only intermittently   Gait without resistance or UE support x 448ft. CGA and moderate cues for posture and step length with increased shuffle with fatigue.  Pt was able to perform full step through with use of ustep, but pt reports the he is "not ready for something like that"    Gait with 5# ankle weights Forward x 426ft, CGA and required moderate cues for posture and improved step length requiring 1 standing rest break at 374ft due to increased festinations.  Sidestepping R and L 46ft x 4 Forward/reverse 39ft x 4  Improved step length noted with LLE compared to R with variable gait  training, 1 near LOB, requiring min assist to prevent lateral LOB to the R   Throughout session, pt extremely verbose and required moderate cues to maintain attention to task.   PATIENT EDUCATION: Education details: Purpose of PT and plan of care Person educated: Patient Education method: Explanation Education comprehension: verbalized understanding  HOME EXERCISE PROGRAM: To be initiated next 1-2 visits  GOALS: Goals reviewed with patient? Yes  SHORT TERM GOALS: Target date: 04/05/2023  Pt will be independent with HEP in order to improve strength and balance in order to decrease fall risk and improve function at home and work.   Baseline: EVAL- No formal HEP In place Goal status: INITIAL   LONG TERM GOALS: Target date: 05/17/2023  1.  Patient (> 71 years old) will complete five times sit to stand test in < 15 seconds indicating an increased LE strength and improved balance. Baseline: EVAL= 18.48 sec without UE support Goal status: INITIAL  2.  Patient will increase FOTO score to equal to or greater than  49   to demonstrate statistically significant improvement in mobility and quality of life.  Baseline: EVAL=49 Goal status: INITIAL   3.  Patient will increase Berg Balance score  by > 6 points to demonstrate decreased fall risk during functional activities. Baseline: EVAL=43/56 Goal status: INITIAL   4.  Patient will reduce timed up and go to <11 seconds to reduce fall risk and demonstrate improved transfer/gait ability. Baseline: EVAL=16.42 Goal status: INITIAL  5.  Patient will increase 10 meter walk test to >1.65m/s as to improve gait speed for better community ambulation and to reduce fall risk. Baseline: EVAL= 0.69 Goal status: INITIAL  6. Patient will increase six minute walk test distance to >1000 for progression to community ambulator and improve gait ability Baseline:03/08/23: 575 ft with progressive shuffling and flexion posture throughout.  Goal status:  INITIAL   ASSESSMENT:  CLINICAL IMPRESSION: Patient presents with good motivation today. PT treatment consists of education of ustep walker, which was noted to be safer, but pt continues to decline use of AD as he is "not ready" to need "all that" dynamic gait with weights on BLE to force improved muscle activation in various planes; min assist once to prevnet R Lateral LOB.  Pt will continue to benefit from skilled physical therapy intervention to address impairments, improve QOL, and attain therapy goals.    OBJECTIVE IMPAIRMENTS: Abnormal gait, decreased activity tolerance, decreased balance, decreased cognition, decreased coordination, decreased endurance, decreased mobility, difficulty walking, decreased ROM, decreased strength, hypomobility, and postural dysfunction.   ACTIVITY LIMITATIONS: carrying, lifting, bending, standing, squatting, and stairs  PARTICIPATION LIMITATIONS: cleaning, shopping, community activity, and yard work  PERSONAL FACTORS: Age and 3+ comorbidities: HTN, Vertigo, arthritis  are also affecting patient's functional outcome.   REHAB POTENTIAL: Good  CLINICAL DECISION MAKING: Stable/uncomplicated  EVALUATION COMPLEXITY: Moderate  PLAN:  PT FREQUENCY: 1-2x/week  PT DURATION: 12 weeks  PLANNED INTERVENTIONS: Therapeutic exercises, Therapeutic activity, Neuromuscular re-education, Balance training, Gait training, Patient/Family education, Self Care, Joint mobilization, Joint manipulation, Stair training, Vestibular training, Canalith repositioning, Orthotic/Fit training, DME instructions, Dry Needling, Spinal manipulation, Spinal mobilization, Cryotherapy, Moist heat, Taping, Manual therapy, and Re-evaluation  PLAN FOR NEXT SESSION:    Progress balance and therex - add to HEP as appropriate.    Golden Pop, PT 03/28/2023, 4:53 PM

## 2023-03-30 ENCOUNTER — Ambulatory Visit: Payer: Medicare Other

## 2023-03-30 DIAGNOSIS — R2689 Other abnormalities of gait and mobility: Secondary | ICD-10-CM

## 2023-03-30 DIAGNOSIS — G20B1 Parkinson's disease with dyskinesia, without mention of fluctuations: Secondary | ICD-10-CM

## 2023-03-30 DIAGNOSIS — R269 Unspecified abnormalities of gait and mobility: Secondary | ICD-10-CM

## 2023-03-30 DIAGNOSIS — R2681 Unsteadiness on feet: Secondary | ICD-10-CM

## 2023-03-30 DIAGNOSIS — R262 Difficulty in walking, not elsewhere classified: Secondary | ICD-10-CM

## 2023-03-30 DIAGNOSIS — M6281 Muscle weakness (generalized): Secondary | ICD-10-CM

## 2023-03-30 DIAGNOSIS — R278 Other lack of coordination: Secondary | ICD-10-CM

## 2023-03-30 NOTE — Therapy (Signed)
OUTPATIENT PHYSICAL THERAPY NEURO TREATMENT   Patient Name: Carlos Mathis MRN: [id number] DOB:[birth year], 82 y.o., male Today's Date: 03/31/2023   PCP: Dr. Einar Crow REFERRING PROVIDER: Dr. Cristopher Peru  END OF SESSION:  PT End of Session - 03/30/23 1021     Visit Number 8    Number of Visits 24    Date for PT Re-Evaluation 05/17/23    Progress Note Due on Visit 10    PT Start Time 1015    PT Stop Time 1059    PT Time Calculation (min) 44 min    Equipment Utilized During Treatment Gait belt    Activity Tolerance Patient tolerated treatment well    Behavior During Therapy WFL for tasks assessed/performed                 Past Medical History:  Diagnosis Date   Arthritis    BPH (benign prostatic hyperplasia)    CKD (chronic kidney disease), stage III (HCC)    Coronary artery disease    GERD (gastroesophageal reflux disease)    History of hiatal hernia    HLD (hyperlipidemia)    Hypertension    Long term current use of antithrombotics/antiplatelets    a.) DAPT therapy (ASA+ ticagrelor)   MCI (mild cognitive impairment) with memory loss    a.) takes apoaequorin   Migraines    OSA on CPAP    Pneumonia    Skin cancer, basal cell    STEMI (ST elevation myocardial infarction) (HCC) 07/27/2020   a.) MI while living in [place]; underwent PCI placing 4.0 x 18 mm and 3.5 x 38 mm Resolute Onyx DES (vessels unknown/unspecified).   T2DM (type 2 diabetes mellitus) (HCC)    Vertigo    Wears dentures    Full upper, partial lower   Wears hearing aid in both ears    Past Surgical History:  Procedure Laterality Date   CARDIAC CATHETERIZATION     CATARACT EXTRACTION Right    CATARACT EXTRACTION W/PHACO Left 05/03/2022   Procedure: CATARACT EXTRACTION PHACO AND INTRAOCULAR LENS PLACEMENT (IOC) LEFT DIABETIC;  Surgeon: Galen Manila, MD;  Location: [facility];  Service: Ophthalmology;  Laterality: Left;  14.77 1:24.0   ESOPHAGOGASTRODUODENOSCOPY (EGD)  WITH PROPOFOL N/A 03/21/2023   Procedure: ESOPHAGOGASTRODUODENOSCOPY (EGD) WITH PROPOFOL;  Surgeon: [name], MD;  Location: [facility];  Service: Gastroenterology;  Laterality: N/A;   HERNIA REPAIR Left    inguinal   INSERTION OF MESH  09/01/2021   Procedure: INSERTION OF MESH;  Surgeon: Carolan Shiver, MD;  Location: [facility];  Service: General;;   JOINT REPLACEMENT     tka   UMBILICAL HERNIA REPAIR     Patient Active Problem List   Diagnosis Date Noted   Acute myocardial infarction, unspecified (HCC) 11/16/2022   Anxiety state 11/16/2022   Balanitis 11/16/2022   Benign paroxysmal positional vertigo 11/16/2022   Cardiac dysrhythmia 11/16/2022   Chronic ischemic heart disease 11/16/2022   Chronic sinusitis 11/16/2022   Gastroesophageal reflux disease 11/16/2022   Headache 11/16/2022   Counseling, unspecified 11/16/2022   History of occlusive disease of artery of lower extremity 11/16/2022   Hypertensive chronic kidney disease with stage 1 through stage 4 chronic kidney disease, or unspecified chronic kidney disease 11/16/2022   Irritable bowel syndrome 11/16/2022   Unspecified dementia, mild, without behavioral disturbance, psychotic disturbance, mood disturbance, and anxiety (HCC) 11/16/2022   Obstructive sleep apnea (adult) (pediatric) 11/16/2022   Osteoarthritis of knee 11/16/2022   Personal history of tuberculosis 11/16/2022  Phimosis 11/16/2022   Restless legs 11/16/2022   Sensorineural hearing loss, bilateral 11/16/2022   Type 2 diabetes mellitus without complications (HCC) 11/16/2022   Diabetic polyneuropathy associated with type 2 diabetes mellitus (HCC) 06/24/2021   Healthcare maintenance 06/24/2021   CAD (coronary artery disease) 09/02/2020   Pure hypercholesterolemia 09/02/2020   Polycystic kidney 02/11/2020   Benign prostatic hyperplasia 02/11/2020   Essential hypertension 02/11/2020   Acquired cyst of kidney 07/09/2019   Spinal stenosis of lumbar  region 05/30/2019   Cervical spondylosis 03/12/2019   Dysphagia 03/26/2015   Hypermetropia 02/04/2015   Presbyopia 02/04/2015   Senile nuclear cataract 02/04/2015   Epistaxis 09/23/2014   Laryngopharyngeal reflux (LPR) 09/23/2014    ONSET DATE: worse since Jan 2024  REFERRING DIAG:  Diagnosis  G20.A1 (ICD-10-CM) - Parkinson's disease    THERAPY DIAG:  Abnormality of gait and mobility  Difficulty in walking, not elsewhere classified  Muscle weakness (generalized)  Unsteadiness on feet  Other abnormalities of gait and mobility  Other lack of coordination  Parkinson's disease with dyskinesia, unspecified whether manifestations fluctuate (HCC)  Rationale for Evaluation and Treatment: Rehabilitation  SUBJECTIVE:                                                                                                                                                                                             SUBJECTIVE STATEMENT: Patient reports no changes since last visit.  Pt accompanied by: self  PERTINENT HISTORY:   Per neurology note submitted in chart by Janice Coffin, PA.  1. 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 Memory loss starting 2018 with cognitive decline over 2022. Patient reports difficulty remembering conversations, difficulty recognizing old friends, getting lost while driving. Per spouse patient can become aggressive behind the wheel. Patient reports visual hallucinations (dark areas out of his periphery) and audio hallucinations. Wife assists with medications and finances. Has become more emotional over the last year and sometimes becomes angry with spouse which is unusual. Denies alcohol use or difficulty sleeping. Per wife significant procrastiantion, low motivation, more hunched  forward posture, balance issues, and gets going walking and has a difficult time stopping.   Patient was cleared to drive per driving assessment, however, we discussed that memory may not keep up with the physical ability to drive if condition continues to get worse   Ambulatory Referral to Physical Therapy [facility])   2. Peripheral neuropathy- decreased sensation with some pain in the feet in a patient with history of agent  orange exposure.  3. Orthostatic Lightheadedness/Vertigo, chronic episodic vertigo  Orthostatic hypotension (neurogenic) can be seen in many conditions such as parkinsonism, diabetic neuropathy, pure autonomic failure etc. Patient should have work up to rule out other causes first such as dehydration, cardiac issues, iatrogenic due to meds etc. Lightheadedness, fainting, fatigue, blurry vision, weakness etc can happen as symptoms. Patient should drink plenty of water, wear compression stockings, exercise, liberal salts, elevate the head end of the bed etc. Fludrocortisone, Midodrine, droxidopa etc. Can be tried in certain patients.   PAIN:  Are you having pain? No  PRECAUTIONS: Fall  RED FLAGS: None   WEIGHT BEARING RESTRICTIONS: No  FALLS: Has patient fallen in last 6 months? No  LIVING ENVIRONMENT: Lives with: Wife Lives in: House/apartment Stairs: Yes: External: 5 steps; can reach both Has following equipment at home: Single point cane and Walker - 2 wheeled  PLOF: Independent  PATIENT GOALS: I want to be stronger and more steady  OBJECTIVE:   DIAGNOSTIC FINDINGS: CLINICAL DATA:  Provided history: Mild cognitive impairment with memory loss.   EXAM: MRI HEAD WITHOUT CONTRAST   TECHNIQUE: Multiplanar, multiecho pulse sequences of the brain and surrounding structures were obtained without intravenous contrast.   COMPARISON:  Head CT 07/31/2021.   FINDINGS: Brain:   Mild-to-moderate generalized cerebral atrophy. Commensurate prominence of  the ventricles and sulci. Comparatively mild cerebellar atrophy.   Multifocal T2 FLAIR hyperintense signal abnormality within the cerebral white matter and pons, nonspecific but compatible with moderate chronic small vessel ischemic disease.   Mild chronic small vessel ischemic changes within the thalami.   There is no acute infarct.   No evidence of an intracranial mass.   No chronic intracranial blood products.   No extra-axial fluid collection.   No midline shift.   Vascular: Maintained flow voids within the proximal large arterial vessels.   Skull and upper cervical spine: No focal suspicious marrow lesion. Degenerative changes and ligamentous hypertrophy at the C1-C2 articulation.   Sinuses/Orbits: No mass or acute finding within the imaged orbits. Prior right ocular lens replacement. Mild mucosal thickening within the bilateral ethmoid and right sphenoid sinuses.   Other: Small-volume fluid within the right mastoid air cells.   IMPRESSION: 1. No evidence of acute intracranial abnormality. 2. Moderate chronic small vessel ischemic changes within the cerebral white matter and pons. 3. Mild chronic small-vessel ischemic changes within the thalami. 4. Mild-to-moderate generalized cerebral atrophy. Comparatively mild cerebellar atrophy. 5. Mild mucosal thickening within the bilateral ethmoid and right sphenoid sinuses. 6. Small-volume fluid within the right mastoid air cells.     Electronically Signed   By: Jackey Loge D.O.   On: 11/11/2021 17:53    COGNITION: Overall cognitive status: Impaired   SENSATION: Light touch: Impaired   COORDINATION: Some difficulty sequencing   POSTURE: forward head   LOWER EXTREMITY MMT:    MMT Right Eval Left Eval  Hip flexion 3+ 4  Hip extension 4 4  Hip abduction 4 4  Hip adduction 4 4  Hip internal rotation 4 4  Hip external rotation 4 4  Knee flexion 4 4  Knee extension 4 4  Ankle dorsiflexion    Ankle  plantarflexion    Ankle inversion    Ankle eversion    (Blank rows = not tested)    TRANSFERS: Assistive device utilized: None  Sit to stand: Complete Independence Stand to sit: Complete Independence Chair to chair: Complete Independence Floor:  Not tested  GAIT: Gait pattern: step through  pattern, decreased arm swing- Right, decreased arm swing- Left, decreased step length- Right, and decreased step length- Left Distance walked: 100+ feet Assistive device utilized: None Level of assistance: SBA Comments: Shuffling gait  FUNCTIONAL TESTS:  5 times sit to stand: 18.48 sec without UE Support  Timed up and go (TUG): 16.42 sec without UE support 6 minute walk test: To be tested 2nd visit 10 meter walk test: 14.84 and 14.62 sec  = 0.69 m/s avg without UE support Berg Balance Scale: 43/56  PATIENT SURVEYS:  FOTO 49 with goal of 30  TODAY'S TREATMENT:                                                                                                                              DATE: 03/31/23      NMR: (all activities performed with CGA, gait belt)   Forward walking in // bars 5# AW and agility ladder- focusing on increased step height/length- VC to look erect  Gait with 5# ankle weights- x 150 ft, CGA and required moderate cues for posture and improved step length -increased festination of gait with fatigue.    Sidestepping R and L 5# using agility ladder in // bars 35ft x  down and back x 5 Forward/reverse gait with 5#  down and back  x 5 Standing Lumbar flex into ext at wall x 15 reps Standing shoulder horizontal ADD at wall x 15 Sit to stand with Overhead diagonal reaching x 15 reps Standing calf raises 5#- 20 reps  Standing hip ext with min UE support x 15 reps   Throughout session, pt verbose and required moderate cues to maintain attention to task.   PATIENT EDUCATION: Education details: Purpose of PT and plan of care Person educated: Patient Education method:  Explanation Education comprehension: verbalized understanding  HOME EXERCISE PROGRAM: To be initiated next 1-2 visits  GOALS: Goals reviewed with patient? Yes  SHORT TERM GOALS: Target date: 04/05/2023  Pt will be independent with HEP in order to improve strength and balance in order to decrease fall risk and improve function at home and work.   Baseline: EVAL- No formal HEP In place Goal status: INITIAL   LONG TERM GOALS: Target date: 05/17/2023  1.  Patient (> 15 years old) will complete five times sit to stand test in < 15 seconds indicating an increased LE strength and improved balance. Baseline: EVAL= 18.48 sec without UE support Goal status: INITIAL  2.  Patient will increase FOTO score to equal to or greater than  49   to demonstrate statistically significant improvement in mobility and quality of life.  Baseline: EVAL=49 Goal status: INITIAL   3.  Patient will increase Berg Balance score by > 6 points to demonstrate decreased fall risk during functional activities. Baseline: EVAL=43/56 Goal status: INITIAL   4.  Patient will reduce timed up and go to <11 seconds to reduce fall risk and demonstrate improved transfer/gait ability. Baseline: EVAL=16.42  Goal status: INITIAL  5.  Patient will increase 10 meter walk test to >1.5m/s as to improve gait speed for better community ambulation and to reduce fall risk. Baseline: EVAL= 0.69 Goal status: INITIAL  6. Patient will increase six minute walk test distance to >1000 for progression to community ambulator and improve gait ability Baseline:03/08/23: 575 ft with progressive shuffling and flexion posture throughout.  Goal status: INITIAL   ASSESSMENT:  CLINICAL IMPRESSION: Patient presented with good motivation for today's session. He was challenged with activities designed to force him to pick up his feet including agility ladder and performed well with resistance with all LE activities. He continues to increase shuffling  with prolonged gait despite some VC. Able to incorporate more postural exercises without difficulty to focus on improving posture with standing/walking.  Pt will continue to benefit from skilled physical therapy intervention to address impairments, improve QOL, and attain therapy goals.    OBJECTIVE IMPAIRMENTS: Abnormal gait, decreased activity tolerance, decreased balance, decreased cognition, decreased coordination, decreased endurance, decreased mobility, difficulty walking, decreased ROM, decreased strength, hypomobility, and postural dysfunction.   ACTIVITY LIMITATIONS: carrying, lifting, bending, standing, squatting, and stairs  PARTICIPATION LIMITATIONS: cleaning, shopping, community activity, and yard work  PERSONAL FACTORS: Age and 3+ comorbidities: HTN, Vertigo, arthritis  are also affecting patient's functional outcome.   REHAB POTENTIAL: Good  CLINICAL DECISION MAKING: Stable/uncomplicated  EVALUATION COMPLEXITY: Moderate  PLAN:  PT FREQUENCY: 1-2x/week  PT DURATION: 12 weeks  PLANNED INTERVENTIONS: Therapeutic exercises, Therapeutic activity, Neuromuscular re-education, Balance training, Gait training, Patient/Family education, Self Care, Joint mobilization, Joint manipulation, Stair training, Vestibular training, Canalith repositioning, Orthotic/Fit training, DME instructions, Dry Needling, Spinal manipulation, Spinal mobilization, Cryotherapy, Moist heat, Taping, Manual therapy, and Re-evaluation  PLAN FOR NEXT SESSION:    Progress balance and therex - add to HEP as appropriate.    Lenda Kelp, PT 03/31/2023, 7:38 AM

## 2023-04-03 ENCOUNTER — Ambulatory Visit: Payer: Medicare Other | Admitting: Physical Therapy

## 2023-04-03 DIAGNOSIS — G20B1 Parkinson's disease with dyskinesia, without mention of fluctuations: Secondary | ICD-10-CM

## 2023-04-03 DIAGNOSIS — M6281 Muscle weakness (generalized): Secondary | ICD-10-CM

## 2023-04-03 DIAGNOSIS — R262 Difficulty in walking, not elsewhere classified: Secondary | ICD-10-CM

## 2023-04-03 DIAGNOSIS — R2681 Unsteadiness on feet: Secondary | ICD-10-CM

## 2023-04-03 DIAGNOSIS — R2689 Other abnormalities of gait and mobility: Secondary | ICD-10-CM

## 2023-04-03 DIAGNOSIS — R278 Other lack of coordination: Secondary | ICD-10-CM

## 2023-04-03 DIAGNOSIS — R269 Unspecified abnormalities of gait and mobility: Secondary | ICD-10-CM

## 2023-04-03 NOTE — Therapy (Signed)
OUTPATIENT PHYSICAL THERAPY NEURO TREATMENT   Patient Name: Carlos Mathis MRN: [id number] DOB:Oct 12, 1940, 82 y.o., male Today's Date: 04/03/2023   PCP: Dr. Einar Crow REFERRING PROVIDER: Dr. Cristopher Peru  END OF SESSION:  PT End of Session - 04/03/23 0932     Visit Number 9    Number of Visits 24    Date for PT Re-Evaluation 05/17/23    Progress Note Due on Visit 10    PT Start Time 0932    PT Stop Time 1015    PT Time Calculation (min) 43 min    Equipment Utilized During Treatment Gait belt    Activity Tolerance Patient tolerated treatment well    Behavior During Therapy WFL for tasks assessed/performed                 Past Medical History:  Diagnosis Date   Arthritis    BPH (benign prostatic hyperplasia)    CKD (chronic kidney disease), stage III (HCC)    Coronary artery disease    GERD (gastroesophageal reflux disease)    History of hiatal hernia    HLD (hyperlipidemia)    Hypertension    Long term current use of antithrombotics/antiplatelets    a.) DAPT therapy (ASA+ ticagrelor)   MCI (mild cognitive impairment) with memory loss    a.) takes apoaequorin   Migraines    OSA on CPAP    Pneumonia    Skin cancer, basal cell    STEMI (ST elevation myocardial infarction) (HCC) 07/27/2020   a.) MI while living in [place]; underwent PCI placing 4.0 x 18 mm and 3.5 x 38 mm Resolute Onyx DES (vessels unknown/unspecified).   T2DM (type 2 diabetes mellitus) (HCC)    Vertigo    Wears dentures    Full upper, partial lower   Wears hearing aid in both ears    Past Surgical History:  Procedure Laterality Date   CARDIAC CATHETERIZATION     CATARACT EXTRACTION Right    CATARACT EXTRACTION W/PHACO Left 05/03/2022   Procedure: CATARACT EXTRACTION PHACO AND INTRAOCULAR LENS PLACEMENT (IOC) LEFT DIABETIC;  Surgeon: Galen Manila, MD;  Location: [facility];  Service: Ophthalmology;  Laterality: Left;  14.77 1:24.0   ESOPHAGOGASTRODUODENOSCOPY (EGD)  WITH PROPOFOL N/A 03/21/2023   Procedure: ESOPHAGOGASTRODUODENOSCOPY (EGD) WITH PROPOFOL;  Surgeon: [name], MD;  Location: [facility];  Service: Gastroenterology;  Laterality: N/A;   HERNIA REPAIR Left    inguinal   INSERTION OF MESH  09/01/2021   Procedure: INSERTION OF MESH;  Surgeon: Carolan Shiver, MD;  Location: [facility];  Service: General;;   JOINT REPLACEMENT     tka   UMBILICAL HERNIA REPAIR     Patient Active Problem List   Diagnosis Date Noted   Acute myocardial infarction, unspecified (HCC) 11/16/2022   Anxiety state 11/16/2022   Balanitis 11/16/2022   Benign paroxysmal positional vertigo 11/16/2022   Cardiac dysrhythmia 11/16/2022   Chronic ischemic heart disease 11/16/2022   Chronic sinusitis 11/16/2022   Gastroesophageal reflux disease 11/16/2022   Headache 11/16/2022   Counseling, unspecified 11/16/2022   History of occlusive disease of artery of lower extremity 11/16/2022   Hypertensive chronic kidney disease with stage 1 through stage 4 chronic kidney disease, or unspecified chronic kidney disease 11/16/2022   Irritable bowel syndrome 11/16/2022   Unspecified dementia, mild, without behavioral disturbance, psychotic disturbance, mood disturbance, and anxiety (HCC) 11/16/2022   Obstructive sleep apnea (adult) (pediatric) 11/16/2022   Osteoarthritis of knee 11/16/2022   Personal history of tuberculosis 11/16/2022  Phimosis 11/16/2022   Restless legs 11/16/2022   Sensorineural hearing loss, bilateral 11/16/2022   Type 2 diabetes mellitus without complications (HCC) 11/16/2022   Diabetic polyneuropathy associated with type 2 diabetes mellitus (HCC) 06/24/2021   Healthcare maintenance 06/24/2021   CAD (coronary artery disease) 09/02/2020   Pure hypercholesterolemia 09/02/2020   Polycystic kidney 02/11/2020   Benign prostatic hyperplasia 02/11/2020   Essential hypertension 02/11/2020   Acquired cyst of kidney 07/09/2019   Spinal stenosis of lumbar  region 05/30/2019   Cervical spondylosis 03/12/2019   Dysphagia 03/26/2015   Hypermetropia 02/04/2015   Presbyopia 02/04/2015   Senile nuclear cataract 02/04/2015   Epistaxis 09/23/2014   Laryngopharyngeal reflux (LPR) 09/23/2014    ONSET DATE: worse since Jan 2024  REFERRING DIAG:  Diagnosis  G20.A1 (ICD-10-CM) - Parkinson's disease    THERAPY DIAG:  Abnormality of gait and mobility  Other abnormalities of gait and mobility  Difficulty in walking, not elsewhere classified  Muscle weakness (generalized)  Unsteadiness on feet  Other lack of coordination  Parkinson's disease with dyskinesia, unspecified whether manifestations fluctuate (HCC)  Rationale for Evaluation and Treatment: Rehabilitation  SUBJECTIVE:                                                                                                                                                                                             SUBJECTIVE STATEMENT: Patient reports that he is doing well. Has had issue getting once of his classic cars repaired. Plans to attend [employer] this afternoon.   Pt accompanied by: self  PERTINENT HISTORY:   Per neurology note submitted in chart by Janice Coffin, PA.  1. 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 Memory loss starting 2018 with cognitive decline over 2022. Patient reports difficulty remembering conversations, difficulty recognizing old friends, getting lost while driving. Per spouse patient can become aggressive behind the wheel. Patient reports visual hallucinations (dark areas out of his periphery) and audio hallucinations. Wife assists with medications and finances. Has become more emotional over the last year and sometimes becomes angry with spouse which is unusual. Denies  alcohol use or difficulty sleeping. Per wife significant procrastiantion, low motivation, more hunched forward posture, balance issues, and gets going walking and has a difficult time stopping.   Patient was cleared to drive per driving assessment, however, we discussed that memory may not keep up with the physical ability to drive if condition continues to get worse   Ambulatory Referral to Physical Therapy [facility])  2. Peripheral neuropathy- decreased sensation with some pain in the feet in a patient with history of agent orange exposure.  3. Orthostatic Lightheadedness/Vertigo, chronic episodic vertigo  Orthostatic hypotension (neurogenic) can be seen in many conditions such as parkinsonism, diabetic neuropathy, pure autonomic failure etc. Patient should have work up to rule out other causes first such as dehydration, cardiac issues, iatrogenic due to meds etc. Lightheadedness, fainting, fatigue, blurry vision, weakness etc can happen as symptoms. Patient should drink plenty of water, wear compression stockings, exercise, liberal salts, elevate the head end of the bed etc. Fludrocortisone, Midodrine, droxidopa etc. Can be tried in certain patients.   PAIN:  Are you having pain? No  PRECAUTIONS: Fall  RED FLAGS: None   WEIGHT BEARING RESTRICTIONS: No  FALLS: Has patient fallen in last 6 months? No  LIVING ENVIRONMENT: Lives with: Wife Lives in: House/apartment Stairs: Yes: External: 5 steps; can reach both Has following equipment at home: Single point cane and Walker - 2 wheeled  PLOF: Independent  PATIENT GOALS: I want to be stronger and more steady  OBJECTIVE:   DIAGNOSTIC FINDINGS: CLINICAL DATA:  Provided history: Mild cognitive impairment with memory loss.   EXAM: MRI HEAD WITHOUT CONTRAST   TECHNIQUE: Multiplanar, multiecho pulse sequences of the brain and surrounding structures were obtained without intravenous contrast.   COMPARISON:  Head CT  07/31/2021.   FINDINGS: Brain:   Mild-to-moderate generalized cerebral atrophy. Commensurate prominence of the ventricles and sulci. Comparatively mild cerebellar atrophy.   Multifocal T2 FLAIR hyperintense signal abnormality within the cerebral white matter and pons, nonspecific but compatible with moderate chronic small vessel ischemic disease.   Mild chronic small vessel ischemic changes within the thalami.   There is no acute infarct.   No evidence of an intracranial mass.   No chronic intracranial blood products.   No extra-axial fluid collection.   No midline shift.   Vascular: Maintained flow voids within the proximal large arterial vessels.   Skull and upper cervical spine: No focal suspicious marrow lesion. Degenerative changes and ligamentous hypertrophy at the C1-C2 articulation.   Sinuses/Orbits: No mass or acute finding within the imaged orbits. Prior right ocular lens replacement. Mild mucosal thickening within the bilateral ethmoid and right sphenoid sinuses.   Other: Small-volume fluid within the right mastoid air cells.   IMPRESSION: 1. No evidence of acute intracranial abnormality. 2. Moderate chronic small vessel ischemic changes within the cerebral white matter and pons. 3. Mild chronic small-vessel ischemic changes within the thalami. 4. Mild-to-moderate generalized cerebral atrophy. Comparatively mild cerebellar atrophy. 5. Mild mucosal thickening within the bilateral ethmoid and right sphenoid sinuses. 6. Small-volume fluid within the right mastoid air cells.     Electronically Signed   By: Jackey Loge D.O.   On: 11/11/2021 17:53    COGNITION: Overall cognitive status: Impaired   SENSATION: Light touch: Impaired   COORDINATION: Some difficulty sequencing   POSTURE: forward head   LOWER EXTREMITY MMT:    MMT Right Eval Left Eval  Hip flexion 3+ 4  Hip extension 4 4  Hip abduction 4 4  Hip adduction 4 4  Hip internal  rotation 4 4  Hip external rotation 4 4  Knee flexion 4 4  Knee extension 4 4  Ankle dorsiflexion    Ankle plantarflexion    Ankle inversion    Ankle eversion    (Blank rows = not tested)    TRANSFERS: Assistive device utilized: None  Sit to stand: Complete Independence Stand to  sit: Complete Independence Chair to chair: Complete Independence Floor:  Not tested  GAIT: Gait pattern: step through pattern, decreased arm swing- Right, decreased arm swing- Left, decreased step length- Right, and decreased step length- Left Distance walked: 100+ feet Assistive device utilized: None Level of assistance: SBA Comments: Shuffling gait  FUNCTIONAL TESTS:  5 times sit to stand: 18.48 sec without UE Support  Timed up and go (TUG): 16.42 sec without UE support 6 minute walk test: To be tested 2nd visit 10 meter walk test: 14.84 and 14.62 sec  = 0.69 m/s avg without UE support Berg Balance Scale: 43/56  PATIENT SURVEYS:  FOTO 49 with goal of 20  TODAY'S TREATMENT:                                                                                                                              DATE: 04/03/23    NMR: (all activities performed with CGA, gait belt) Octane eliptical level 4 x 5 min with cues for full ROM in BLE to allow slight trunk rotation.    Forward/reverse walking in // bars 5#  over bolsters in floor x 5 laps. Side stepping R and L over bolsters x 5 laps. Forward lunge over bolster x 18 bil with reduced use of UE support with increased repetitions.    Gait with 5# ankle weights- 2x 300 ft, supervision assist and required moderate cues for posture and improved step length - increased festination of gait with fatigue through doorway and following turns.    Modified chair squat with UE support on rail  2 x 15   Throughout session, pt verbose and required moderate cues to maintain attention to task.   PATIENT EDUCATION: Education details: Purpose of PT and plan of  care Person educated: Patient Education method: Explanation Education comprehension: verbalized understanding  HOME EXERCISE PROGRAM: To be initiated next 1-2 visits  GOALS: Goals reviewed with patient? Yes  SHORT TERM GOALS: Target date: 04/05/2023  Pt will be independent with HEP in order to improve strength and balance in order to decrease fall risk and improve function at home and work.   Baseline: EVAL- No formal HEP In place Goal status: INITIAL   LONG TERM GOALS: Target date: 05/17/2023  1.  Patient (> 39 years old) will complete five times sit to stand test in < 15 seconds indicating an increased LE strength and improved balance. Baseline: EVAL= 18.48 sec without UE support Goal status: INITIAL  2.  Patient will increase FOTO score to equal to or greater than  49   to demonstrate statistically significant improvement in mobility and quality of life.  Baseline: EVAL=49 Goal status: INITIAL   3.  Patient will increase Berg Balance score by > 6 points to demonstrate decreased fall risk during functional activities. Baseline: EVAL=43/56 Goal status: INITIAL   4.  Patient will reduce timed up and go to <11 seconds to reduce fall risk and demonstrate improved transfer/gait  ability. Baseline: EVAL=16.42 Goal status: INITIAL  5.  Patient will increase 10 meter walk test to >1.25m/s as to improve gait speed for better community ambulation and to reduce fall risk. Baseline: EVAL= 0.69 Goal status: INITIAL  6. Patient will increase six minute walk test distance to >1000 for progression to community ambulator and improve gait ability Baseline:03/08/23: 575 ft with progressive shuffling and flexion posture throughout.  Goal status: INITIAL   ASSESSMENT:  CLINICAL IMPRESSION: Patient presented with good motivation for today's session. PT treatment focused on improved step length and posture through functional large amplitude, weighted movements to improve muscle recruitment  through gait. Noted  to have increased festinations with turns when fatigued requiring seated rest breaks.  Pt will continue to benefit from skilled physical therapy intervention to address impairments, improve QOL, and attain therapy goals.    OBJECTIVE IMPAIRMENTS: Abnormal gait, decreased activity tolerance, decreased balance, decreased cognition, decreased coordination, decreased endurance, decreased mobility, difficulty walking, decreased ROM, decreased strength, hypomobility, and postural dysfunction.   ACTIVITY LIMITATIONS: carrying, lifting, bending, standing, squatting, and stairs  PARTICIPATION LIMITATIONS: cleaning, shopping, community activity, and yard work  PERSONAL FACTORS: Age and 3+ comorbidities: HTN, Vertigo, arthritis  are also affecting patient's functional outcome.   REHAB POTENTIAL: Good  CLINICAL DECISION MAKING: Stable/uncomplicated  EVALUATION COMPLEXITY: Moderate  PLAN:  PT FREQUENCY: 1-2x/week  PT DURATION: 12 weeks  PLANNED INTERVENTIONS: Therapeutic exercises, Therapeutic activity, Neuromuscular re-education, Balance training, Gait training, Patient/Family education, Self Care, Joint mobilization, Joint manipulation, Stair training, Vestibular training, Canalith repositioning, Orthotic/Fit training, DME instructions, Dry Needling, Spinal manipulation, Spinal mobilization, Cryotherapy, Moist heat, Taping, Manual therapy, and Re-evaluation  PLAN FOR NEXT SESSION:    Progress balance and therex - add to HEP as appropriate.    Golden Pop, PT 04/03/2023, 9:37 AM

## 2023-04-06 ENCOUNTER — Telehealth: Payer: Self-pay

## 2023-04-06 ENCOUNTER — Ambulatory Visit: Payer: Medicare Other

## 2023-04-06 NOTE — Telephone Encounter (Signed)
Patient Name: Carlos Mathis MRN: [id number] DOB:1941/04/29, 82 y.o., male Today's Date: 04/06/2023  Phone call to Patient as he did not show for his scheduled time. Spoke with wife [name] who stated he forgot his appointment. Confirmed time for next scheduled appointment with wife 04/11/2023. She verbalized understanding and stated he was sorry he forgot his appointment.    Lenda Kelp, PT 04/06/2023, 1:35 PM

## 2023-04-11 ENCOUNTER — Ambulatory Visit: Payer: Medicare Other

## 2023-04-11 DIAGNOSIS — G20B1 Parkinson's disease with dyskinesia, without mention of fluctuations: Secondary | ICD-10-CM

## 2023-04-11 DIAGNOSIS — M6281 Muscle weakness (generalized): Secondary | ICD-10-CM

## 2023-04-11 DIAGNOSIS — R278 Other lack of coordination: Secondary | ICD-10-CM

## 2023-04-11 DIAGNOSIS — R2681 Unsteadiness on feet: Secondary | ICD-10-CM

## 2023-04-11 DIAGNOSIS — R2689 Other abnormalities of gait and mobility: Secondary | ICD-10-CM

## 2023-04-11 DIAGNOSIS — R269 Unspecified abnormalities of gait and mobility: Secondary | ICD-10-CM

## 2023-04-11 DIAGNOSIS — R262 Difficulty in walking, not elsewhere classified: Secondary | ICD-10-CM

## 2023-04-11 NOTE — Therapy (Signed)
OUTPATIENT PHYSICAL THERAPY NEURO TREATMENT/Physical Therapy Progress Note   Dates of reporting period  02/22/2023   to   04/11/2023    Patient Name: Carlos Mathis MRN: [id number] DOB:01-20-1941, 82 y.o., male Today's Date: 04/12/2023   PCP: Dr. Einar Crow REFERRING PROVIDER: Dr. Cristopher Peru  END OF SESSION:  PT End of Session - 04/11/23 1150     Visit Number 10    Number of Visits 24    Date for PT Re-Evaluation 05/17/23    Progress Note Due on Visit 20    PT Start Time 1150    PT Stop Time 1230    PT Time Calculation (min) 40 min    Equipment Utilized During Treatment Gait belt    Activity Tolerance Patient tolerated treatment well    Behavior During Therapy WFL for tasks assessed/performed                  Past Medical History:  Diagnosis Date   Arthritis    BPH (benign prostatic hyperplasia)    CKD (chronic kidney disease), stage III (HCC)    Coronary artery disease    GERD (gastroesophageal reflux disease)    History of hiatal hernia    HLD (hyperlipidemia)    Hypertension    Long term current use of antithrombotics/antiplatelets    a.) DAPT therapy (ASA+ ticagrelor)   MCI (mild cognitive impairment) with memory loss    a.) takes apoaequorin   Migraines    OSA on CPAP    Pneumonia    Skin cancer, basal cell    STEMI (ST elevation myocardial infarction) (HCC) 07/27/2020   a.) MI while living in [place]; underwent PCI placing 4.0 x 18 mm and 3.5 x 38 mm Resolute Onyx DES (vessels unknown/unspecified).   T2DM (type 2 diabetes mellitus) (HCC)    Vertigo    Wears dentures    Full upper, partial lower   Wears hearing aid in both ears    Past Surgical History:  Procedure Laterality Date   CARDIAC CATHETERIZATION     CATARACT EXTRACTION Right    CATARACT EXTRACTION W/PHACO Left 05/03/2022   Procedure: CATARACT EXTRACTION PHACO AND INTRAOCULAR LENS PLACEMENT (IOC) LEFT DIABETIC;  Surgeon: Galen Manila, MD;  Location: [facility];   Service: Ophthalmology;  Laterality: Left;  14.77 1:24.0   ESOPHAGOGASTRODUODENOSCOPY (EGD) WITH PROPOFOL N/A 03/21/2023   Procedure: ESOPHAGOGASTRODUODENOSCOPY (EGD) WITH PROPOFOL;  Surgeon: [name], MD;  Location: [facility];  Service: Gastroenterology;  Laterality: N/A;   HERNIA REPAIR Left    inguinal   INSERTION OF MESH  09/01/2021   Procedure: INSERTION OF MESH;  Surgeon: Carolan Shiver, MD;  Location: [facility];  Service: General;;   JOINT REPLACEMENT     tka   UMBILICAL HERNIA REPAIR     Patient Active Problem List   Diagnosis Date Noted   Acute myocardial infarction, unspecified (HCC) 11/16/2022   Anxiety state 11/16/2022   Balanitis 11/16/2022   Benign paroxysmal positional vertigo 11/16/2022   Cardiac dysrhythmia 11/16/2022   Chronic ischemic heart disease 11/16/2022   Chronic sinusitis 11/16/2022   Gastroesophageal reflux disease 11/16/2022   Headache 11/16/2022   Counseling, unspecified 11/16/2022   History of occlusive disease of artery of lower extremity 11/16/2022   Hypertensive chronic kidney disease with stage 1 through stage 4 chronic kidney disease, or unspecified chronic kidney disease 11/16/2022   Irritable bowel syndrome 11/16/2022   Unspecified dementia, mild, without behavioral disturbance, psychotic disturbance, mood disturbance, and anxiety (HCC) 11/16/2022  Obstructive sleep apnea (adult) (pediatric) 11/16/2022   Osteoarthritis of knee 11/16/2022   Personal history of tuberculosis 11/16/2022   Phimosis 11/16/2022   Restless legs 11/16/2022   Sensorineural hearing loss, bilateral 11/16/2022   Type 2 diabetes mellitus without complications (HCC) 11/16/2022   Diabetic polyneuropathy associated with type 2 diabetes mellitus (HCC) 06/24/2021   Healthcare maintenance 06/24/2021   CAD (coronary artery disease) 09/02/2020   Pure hypercholesterolemia 09/02/2020   Polycystic kidney 02/11/2020   Benign prostatic hyperplasia 02/11/2020   Essential  hypertension 02/11/2020   Acquired cyst of kidney 07/09/2019   Spinal stenosis of lumbar region 05/30/2019   Cervical spondylosis 03/12/2019   Dysphagia 03/26/2015   Hypermetropia 02/04/2015   Presbyopia 02/04/2015   Senile nuclear cataract 02/04/2015   Epistaxis 09/23/2014   Laryngopharyngeal reflux (LPR) 09/23/2014    ONSET DATE: worse since Jan 2024  REFERRING DIAG:  Diagnosis  G20.A1 (ICD-10-CM) - Parkinson's disease    THERAPY DIAG:  Abnormality of gait and mobility  Other abnormalities of gait and mobility  Difficulty in walking, not elsewhere classified  Muscle weakness (generalized)  Unsteadiness on feet  Other lack of coordination  Parkinson's disease with dyskinesia, unspecified whether manifestations fluctuate (HCC)  Rationale for Evaluation and Treatment: Rehabilitation  SUBJECTIVE:                                                                                                                                                                                             SUBJECTIVE STATEMENT: Patient reports doing pretty well this week. Trying not to shuffle and no falls since last visit.   Pt accompanied by: self  PERTINENT HISTORY:   Per neurology note submitted in chart by Janice Coffin, PA.  1. 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 Memory loss starting 2018 with cognitive decline over 2022. Patient reports difficulty remembering conversations, difficulty recognizing old friends, getting lost while driving. Per spouse patient can become aggressive behind the wheel. Patient reports visual hallucinations (dark areas out of his periphery) and audio hallucinations. Wife assists with medications and finances. Has become more emotional over the last year and sometimes becomes angry  with spouse which is unusual. Denies alcohol use or difficulty sleeping. Per wife significant procrastiantion, low motivation, more hunched forward posture, balance issues, and gets going walking and has a difficult time stopping.   Patient was cleared to drive per driving assessment, however, we discussed that memory may not keep up with the physical ability to drive if condition continues to  get worse   Ambulatory Referral to Physical Therapy [facility])   2. Peripheral neuropathy- decreased sensation with some pain in the feet in a patient with history of agent orange exposure.  3. Orthostatic Lightheadedness/Vertigo, chronic episodic vertigo  Orthostatic hypotension (neurogenic) can be seen in many conditions such as parkinsonism, diabetic neuropathy, pure autonomic failure etc. Patient should have work up to rule out other causes first such as dehydration, cardiac issues, iatrogenic due to meds etc. Lightheadedness, fainting, fatigue, blurry vision, weakness etc can happen as symptoms. Patient should drink plenty of water, wear compression stockings, exercise, liberal salts, elevate the head end of the bed etc. Fludrocortisone, Midodrine, droxidopa etc. Can be tried in certain patients.   PAIN:  Are you having pain? No  PRECAUTIONS: Fall  RED FLAGS: None   WEIGHT BEARING RESTRICTIONS: No  FALLS: Has patient fallen in last 6 months? No  LIVING ENVIRONMENT: Lives with: Wife Lives in: House/apartment Stairs: Yes: External: 5 steps; can reach both Has following equipment at home: Single point cane and Walker - 2 wheeled  PLOF: Independent  PATIENT GOALS: I want to be stronger and more steady  OBJECTIVE:   DIAGNOSTIC FINDINGS: CLINICAL DATA:  Provided history: Mild cognitive impairment with memory loss.   EXAM: MRI HEAD WITHOUT CONTRAST   TECHNIQUE: Multiplanar, multiecho pulse sequences of the brain and surrounding structures were obtained without intravenous  contrast.   COMPARISON:  Head CT 07/31/2021.   FINDINGS: Brain:   Mild-to-moderate generalized cerebral atrophy. Commensurate prominence of the ventricles and sulci. Comparatively mild cerebellar atrophy.   Multifocal T2 FLAIR hyperintense signal abnormality within the cerebral white matter and pons, nonspecific but compatible with moderate chronic small vessel ischemic disease.   Mild chronic small vessel ischemic changes within the thalami.   There is no acute infarct.   No evidence of an intracranial mass.   No chronic intracranial blood products.   No extra-axial fluid collection.   No midline shift.   Vascular: Maintained flow voids within the proximal large arterial vessels.   Skull and upper cervical spine: No focal suspicious marrow lesion. Degenerative changes and ligamentous hypertrophy at the C1-C2 articulation.   Sinuses/Orbits: No mass or acute finding within the imaged orbits. Prior right ocular lens replacement. Mild mucosal thickening within the bilateral ethmoid and right sphenoid sinuses.   Other: Small-volume fluid within the right mastoid air cells.   IMPRESSION: 1. No evidence of acute intracranial abnormality. 2. Moderate chronic small vessel ischemic changes within the cerebral white matter and pons. 3. Mild chronic small-vessel ischemic changes within the thalami. 4. Mild-to-moderate generalized cerebral atrophy. Comparatively mild cerebellar atrophy. 5. Mild mucosal thickening within the bilateral ethmoid and right sphenoid sinuses. 6. Small-volume fluid within the right mastoid air cells.     Electronically Signed   By: Jackey Loge D.O.   On: 11/11/2021 17:53    COGNITION: Overall cognitive status: Impaired   SENSATION: Light touch: Impaired   COORDINATION: Some difficulty sequencing   POSTURE: forward head   LOWER EXTREMITY MMT:    MMT Right Eval Left Eval  Hip flexion 3+ 4  Hip extension 4 4  Hip abduction 4 4   Hip adduction 4 4  Hip internal rotation 4 4  Hip external rotation 4 4  Knee flexion 4 4  Knee extension 4 4  Ankle dorsiflexion    Ankle plantarflexion    Ankle inversion    Ankle eversion    (Blank rows = not tested)  TRANSFERS: Assistive device utilized: None  Sit to stand: Complete Independence Stand to sit: Complete Independence Chair to chair: Complete Independence Floor:  Not tested  GAIT: Gait pattern: step through pattern, decreased arm swing- Right, decreased arm swing- Left, decreased step length- Right, and decreased step length- Left Distance walked: 100+ feet Assistive device utilized: None Level of assistance: SBA Comments: Shuffling gait  FUNCTIONAL TESTS:  5 times sit to stand: 18.48 sec without UE Support  Timed up and go (TUG): 16.42 sec without UE support 6 minute walk test: To be tested 2nd visit 10 meter walk test: 14.84 and 14.62 sec  = 0.69 m/s avg without UE support Berg Balance Scale: 43/56  PATIENT SURVEYS:  FOTO 49 with goal of 19  TODAY'S TREATMENT:                                                                                                                              DATE: 04/11/2023    Physical therapy treatment session today consisted of completing assessment of goals and administration of testing as demonstrated and documented in flow sheet, treatment, and goals section of this note. Addition treatments may be found below.    Throughout session, pt verbose and required moderate cues to maintain attention to task  PATIENT EDUCATION: Education details: Purpose of PT and plan of care Person educated: Patient Education method: Explanation Education comprehension: verbalized understanding  HOME EXERCISE PROGRAM:   GOALS: Goals reviewed with patient? Yes  SHORT TERM GOALS: Target date: 04/05/2023  Pt will be independent with HEP in order to improve strength and balance in order to decrease fall risk and improve function  at home and work.   Baseline: EVAL- No formal HEP In place; 04/11/2023- patient reports walking daily and attempting not to shuffle and going to [place] steady. Goal status: ONGOING   LONG TERM GOALS: Target date: 05/17/2023  1.  Patient (> 64 years old) will complete five times sit to stand test in < 15 seconds indicating an increased LE strength and improved balance. Baseline: EVAL= 18.48 sec without UE support; 04/11/2023= 14.94 sec without UE support Goal status: MET- will keep goal active for now to ensure consistency  2.  Patient will increase FOTO score to equal to or greater than  59   to demonstrate statistically significant improvement in mobility and quality of life.  Baseline: EVAL=49; 04/11/2023= 59 Goal status: PROGRESSING/REVISED   3.  Patient will increase Berg Balance score by > 6 points to demonstrate decreased fall risk during functional activities. Baseline: EVAL=43/56 Goal status: INITIAL   4.  Patient will reduce timed up and go to <11 seconds to reduce fall risk and demonstrate improved transfer/gait ability. Baseline: EVAL=16.42; 04/11/2023= 10 sec without UE support Goal status: MET  5.  Patient will increase 10 meter walk test to >1.24m/s as to improve gait speed for better community ambulation and to reduce fall risk. Baseline: EVAL= 0.69; 04/11/2023= 1.0 m/s without  AD.  Goal status: PROGRESSING  6. Patient will increase six minute walk test distance to >1000 for progression to community ambulator and improve gait ability Baseline:03/08/23: 575 ft with progressive shuffling and flexion posture throughout. 04/11/2023= 925 feet with some festinating gait- worse with increased distance.  Goal status: PROGRESSING   ASSESSMENT:  CLINICAL IMPRESSION: Patient presents with good motivation for today's assessment visit. He has demonstrated good overall progress- improving with gait speed, distance, LE muscle strength. He continues to present with forward trunk posture  and continues to shuffle with festinating gait- increasing his risk of falling- worse with prolonged walking. Patient's condition has the potential to improve in response to therapy. Maximum improvement is yet to be obtained. The anticipated improvement is attainable and reasonable in a generally predictable time.  Pt will continue to benefit from skilled physical therapy intervention to address impairments, improve QOL, and attain therapy goals.    OBJECTIVE IMPAIRMENTS: Abnormal gait, decreased activity tolerance, decreased balance, decreased cognition, decreased coordination, decreased endurance, decreased mobility, difficulty walking, decreased ROM, decreased strength, hypomobility, and postural dysfunction.   ACTIVITY LIMITATIONS: carrying, lifting, bending, standing, squatting, and stairs  PARTICIPATION LIMITATIONS: cleaning, shopping, community activity, and yard work  PERSONAL FACTORS: Age and 3+ comorbidities: HTN, Vertigo, arthritis  are also affecting patient's functional outcome.   REHAB POTENTIAL: Good  CLINICAL DECISION MAKING: Stable/uncomplicated  EVALUATION COMPLEXITY: Moderate  PLAN:  PT FREQUENCY: 1-2x/week  PT DURATION: 12 weeks  PLANNED INTERVENTIONS: Therapeutic exercises, Therapeutic activity, Neuromuscular re-education, Balance training, Gait training, Patient/Family education, Self Care, Joint mobilization, Joint manipulation, Stair training, Vestibular training, Canalith repositioning, Orthotic/Fit training, DME instructions, Dry Needling, Spinal manipulation, Spinal mobilization, Cryotherapy, Moist heat, Taping, Manual therapy, and Re-evaluation  PLAN FOR NEXT SESSION:    Progress balance and therex - add to HEP as appropriate.    Lenda Kelp, PT 04/12/2023, 11:42 AM

## 2023-04-14 ENCOUNTER — Ambulatory Visit: Payer: Medicare Other

## 2023-04-14 DIAGNOSIS — R278 Other lack of coordination: Secondary | ICD-10-CM

## 2023-04-14 DIAGNOSIS — G20B1 Parkinson's disease with dyskinesia, without mention of fluctuations: Secondary | ICD-10-CM

## 2023-04-14 DIAGNOSIS — R269 Unspecified abnormalities of gait and mobility: Secondary | ICD-10-CM

## 2023-04-14 DIAGNOSIS — R262 Difficulty in walking, not elsewhere classified: Secondary | ICD-10-CM

## 2023-04-14 DIAGNOSIS — M6281 Muscle weakness (generalized): Secondary | ICD-10-CM

## 2023-04-14 DIAGNOSIS — R2689 Other abnormalities of gait and mobility: Secondary | ICD-10-CM

## 2023-04-14 DIAGNOSIS — R2681 Unsteadiness on feet: Secondary | ICD-10-CM

## 2023-04-14 NOTE — Therapy (Signed)
OUTPATIENT PHYSICAL THERAPY NEURO TREATMENT  Patient Name: Carlos Mathis MRN: [id number] DOB:12-Nov-1940, 82 y.o., male Today's Date: 04/16/2023   PCP: Dr. Einar Crow REFERRING PROVIDER: Dr. Cristopher Peru  END OF SESSION:  PT End of Session - 04/16/23 2325     Visit Number 11    Number of Visits 24    Date for PT Re-Evaluation 05/17/23    Progress Note Due on Visit 20    PT Start Time 0850    PT Stop Time 0929    PT Time Calculation (min) 39 min    Equipment Utilized During Treatment Gait belt    Activity Tolerance Patient tolerated treatment well    Behavior During Therapy WFL for tasks assessed/performed                  Past Medical History:  Diagnosis Date   Arthritis    BPH (benign prostatic hyperplasia)    CKD (chronic kidney disease), stage III (HCC)    Coronary artery disease    GERD (gastroesophageal reflux disease)    History of hiatal hernia    HLD (hyperlipidemia)    Hypertension    Long term current use of antithrombotics/antiplatelets    a.) DAPT therapy (ASA+ ticagrelor)   MCI (mild cognitive impairment) with memory loss    a.) takes apoaequorin   Migraines    OSA on CPAP    Pneumonia    Skin cancer, basal cell    STEMI (ST elevation myocardial infarction) (HCC) 07/27/2020   a.) MI while living in [place]; underwent PCI placing 4.0 x 18 mm and 3.5 x 38 mm Resolute Onyx DES (vessels unknown/unspecified).   T2DM (type 2 diabetes mellitus) (HCC)    Vertigo    Wears dentures    Full upper, partial lower   Wears hearing aid in both ears    Past Surgical History:  Procedure Laterality Date   CARDIAC CATHETERIZATION     CATARACT EXTRACTION Right    CATARACT EXTRACTION W/PHACO Left 05/03/2022   Procedure: CATARACT EXTRACTION PHACO AND INTRAOCULAR LENS PLACEMENT (IOC) LEFT DIABETIC;  Surgeon: Galen Manila, MD;  Location: [facility];  Service: Ophthalmology;  Laterality: Left;  14.77 1:24.0   ESOPHAGOGASTRODUODENOSCOPY (EGD)  WITH PROPOFOL N/A 03/21/2023   Procedure: ESOPHAGOGASTRODUODENOSCOPY (EGD) WITH PROPOFOL;  Surgeon: [name], MD;  Location: [facility];  Service: Gastroenterology;  Laterality: N/A;   HERNIA REPAIR Left    inguinal   INSERTION OF MESH  09/01/2021   Procedure: INSERTION OF MESH;  Surgeon: Carolan Shiver, MD;  Location: [facility];  Service: General;;   JOINT REPLACEMENT     tka   UMBILICAL HERNIA REPAIR     Patient Active Problem List   Diagnosis Date Noted   Acute myocardial infarction, unspecified (HCC) 11/16/2022   Anxiety state 11/16/2022   Balanitis 11/16/2022   Benign paroxysmal positional vertigo 11/16/2022   Cardiac dysrhythmia 11/16/2022   Chronic ischemic heart disease 11/16/2022   Chronic sinusitis 11/16/2022   Gastroesophageal reflux disease 11/16/2022   Headache 11/16/2022   Counseling, unspecified 11/16/2022   History of occlusive disease of artery of lower extremity 11/16/2022   Hypertensive chronic kidney disease with stage 1 through stage 4 chronic kidney disease, or unspecified chronic kidney disease 11/16/2022   Irritable bowel syndrome 11/16/2022   Unspecified dementia, mild, without behavioral disturbance, psychotic disturbance, mood disturbance, and anxiety (HCC) 11/16/2022   Obstructive sleep apnea (adult) (pediatric) 11/16/2022   Osteoarthritis of knee 11/16/2022   Personal history of tuberculosis 11/16/2022  Phimosis 11/16/2022   Restless legs 11/16/2022   Sensorineural hearing loss, bilateral 11/16/2022   Type 2 diabetes mellitus without complications (HCC) 11/16/2022   Diabetic polyneuropathy associated with type 2 diabetes mellitus (HCC) 06/24/2021   Healthcare maintenance 06/24/2021   CAD (coronary artery disease) 09/02/2020   Pure hypercholesterolemia 09/02/2020   Polycystic kidney 02/11/2020   Benign prostatic hyperplasia 02/11/2020   Essential hypertension 02/11/2020   Acquired cyst of kidney 07/09/2019   Spinal stenosis of lumbar  region 05/30/2019   Cervical spondylosis 03/12/2019   Dysphagia 03/26/2015   Hypermetropia 02/04/2015   Presbyopia 02/04/2015   Senile nuclear cataract 02/04/2015   Epistaxis 09/23/2014   Laryngopharyngeal reflux (LPR) 09/23/2014    ONSET DATE: worse since Jan 2024  REFERRING DIAG:  Diagnosis  G20.A1 (ICD-10-CM) - Parkinson's disease    THERAPY DIAG:  Abnormality of gait and mobility  Other abnormalities of gait and mobility  Difficulty in walking, not elsewhere classified  Muscle weakness (generalized)  Unsteadiness on feet  Other lack of coordination  Parkinson's disease with dyskinesia, unspecified whether manifestations fluctuate (HCC)  Rationale for Evaluation and Treatment: Rehabilitation  SUBJECTIVE:                                                                                                                                                                                             SUBJECTIVE STATEMENT: Patient reports no falls and continuing to do well overall.   Pt accompanied by: self  PERTINENT HISTORY:   Per neurology note submitted in chart by Janice Coffin, PA.  1. 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 Memory loss starting 2018 with cognitive decline over 2022. Patient reports difficulty remembering conversations, difficulty recognizing old friends, getting lost while driving. Per spouse patient can become aggressive behind the wheel. Patient reports visual hallucinations (dark areas out of his periphery) and audio hallucinations. Wife assists with medications and finances. Has become more emotional over the last year and sometimes becomes angry with spouse which is unusual. Denies alcohol use or difficulty sleeping. Per wife significant procrastiantion, low motivation,  more hunched forward posture, balance issues, and gets going walking and has a difficult time stopping.   Patient was cleared to drive per driving assessment, however, we discussed that memory may not keep up with the physical ability to drive if condition continues to get worse   Ambulatory Referral to Physical Therapy [facility] Sine)   2. Peripheral neuropathy- decreased sensation with some pain in the feet in a patient  with history of agent orange exposure.  3. Orthostatic Lightheadedness/Vertigo, chronic episodic vertigo  Orthostatic hypotension (neurogenic) can be seen in many conditions such as parkinsonism, diabetic neuropathy, pure autonomic failure etc. Patient should have work up to rule out other causes first such as dehydration, cardiac issues, iatrogenic due to meds etc. Lightheadedness, fainting, fatigue, blurry vision, weakness etc can happen as symptoms. Patient should drink plenty of water, wear compression stockings, exercise, liberal salts, elevate the head end of the bed etc. Fludrocortisone, Midodrine, droxidopa etc. Can be tried in certain patients.   PAIN:  Are you having pain? No  PRECAUTIONS: Fall  RED FLAGS: None   WEIGHT BEARING RESTRICTIONS: No  FALLS: Has patient fallen in last 6 months? No  LIVING ENVIRONMENT: Lives with: Wife Lives in: House/apartment Stairs: Yes: External: 5 steps; can reach both Has following equipment at home: Single point cane and Walker - 2 wheeled  PLOF: Independent  PATIENT GOALS: I want to be stronger and more steady  OBJECTIVE:   DIAGNOSTIC FINDINGS: CLINICAL DATA:  Provided history: Mild cognitive impairment with memory loss.   EXAM: MRI HEAD WITHOUT CONTRAST   TECHNIQUE: Multiplanar, multiecho pulse sequences of the brain and surrounding structures were obtained without intravenous contrast.   COMPARISON:  Head CT 07/31/2021.   FINDINGS: Brain:   Mild-to-moderate generalized cerebral atrophy.  Commensurate prominence of the ventricles and sulci. Comparatively mild cerebellar atrophy.   Multifocal T2 FLAIR hyperintense signal abnormality within the cerebral white matter and pons, nonspecific but compatible with moderate chronic small vessel ischemic disease.   Mild chronic small vessel ischemic changes within the thalami.   There is no acute infarct.   No evidence of an intracranial mass.   No chronic intracranial blood products.   No extra-axial fluid collection.   No midline shift.   Vascular: Maintained flow voids within the proximal large arterial vessels.   Skull and upper cervical spine: No focal suspicious marrow lesion. Degenerative changes and ligamentous hypertrophy at the C1-C2 articulation.   Sinuses/Orbits: No mass or acute finding within the imaged orbits. Prior right ocular lens replacement. Mild mucosal thickening within the bilateral ethmoid and right sphenoid sinuses.   Other: Small-volume fluid within the right mastoid air cells.   IMPRESSION: 1. No evidence of acute intracranial abnormality. 2. Moderate chronic small vessel ischemic changes within the cerebral white matter and pons. 3. Mild chronic small-vessel ischemic changes within the thalami. 4. Mild-to-moderate generalized cerebral atrophy. Comparatively mild cerebellar atrophy. 5. Mild mucosal thickening within the bilateral ethmoid and right sphenoid sinuses. 6. Small-volume fluid within the right mastoid air cells.     Electronically Signed   By: Jackey Loge D.O.   On: 11/11/2021 17:53    COGNITION: Overall cognitive status: Impaired   SENSATION: Light touch: Impaired   COORDINATION: Some difficulty sequencing   POSTURE: forward head   LOWER EXTREMITY MMT:    MMT Right Eval Left Eval  Hip flexion 3+ 4  Hip extension 4 4  Hip abduction 4 4  Hip adduction 4 4  Hip internal rotation 4 4  Hip external rotation 4 4  Knee flexion 4 4  Knee extension 4 4   Ankle dorsiflexion    Ankle plantarflexion    Ankle inversion    Ankle eversion    (Blank rows = not tested)    TRANSFERS: Assistive device utilized: None  Sit to stand: Complete Independence Stand to sit: Complete Independence Chair to chair: Complete Independence Floor:  Not tested  GAIT:  Gait pattern: step through pattern, decreased arm swing- Right, decreased arm swing- Left, decreased step length- Right, and decreased step length- Left Distance walked: 100+ feet Assistive device utilized: None Level of assistance: SBA Comments: Shuffling gait  FUNCTIONAL TESTS:  5 times sit to stand: 18.48 sec without UE Support  Timed up and go (TUG): 16.42 sec without UE support 6 minute walk test: To be tested 2nd visit 10 meter walk test: 14.84 and 14.62 sec  = 0.69 m/s avg without UE support Berg Balance Scale: 43/56  PATIENT SURVEYS:  FOTO 49 with goal of 88  TODAY'S TREATMENT:                                                                                                                              DATE: 04/14/2023    Step over yoga block with 5# and UE support in // bars x 15 reps each LE Dynamic high knee marching 5# without AW x 15 reps   Postural strengthening:  - wall posture stretch x 60 sec -Horizontal sh abd GTB x 60 sec -Lumbar flex into ext 2 sets of 15 reps -Scap row with GTB 2 sets of 12 reps  - Shoulder ext with GTB 2 sets of 12 reps  Sit to stand with overhead reach and calf raises x 12 reps Resistive gait: 5# x 300 feet - Focusing on increased step length and maintaining erect posture    Throughout session, pt verbose and required moderate cues to maintain attention to task  PATIENT EDUCATION: Education details: Purpose of PT and plan of care Person educated: Patient Education method: Explanation Education comprehension: verbalized understanding  HOME EXERCISE PROGRAM:   GOALS: Goals reviewed with patient? Yes  SHORT TERM GOALS: Target  date: 04/05/2023  Pt will be independent with HEP in order to improve strength and balance in order to decrease fall risk and improve function at home and work.   Baseline: EVAL- No formal HEP In place; 04/11/2023- patient reports walking daily and attempting not to shuffle and going to [place] steady. Goal status: ONGOING   LONG TERM GOALS: Target date: 05/17/2023  1.  Patient (> 13 years old) will complete five times sit to stand test in < 15 seconds indicating an increased LE strength and improved balance. Baseline: EVAL= 18.48 sec without UE support; 04/11/2023= 14.94 sec without UE support Goal status: MET- will keep goal active for now to ensure consistency  2.  Patient will increase FOTO score to equal to or greater than  59   to demonstrate statistically significant improvement in mobility and quality of life.  Baseline: EVAL=49; 04/11/2023= 59 Goal status: PROGRESSING/REVISED   3.  Patient will increase Berg Balance score by > 6 points to demonstrate decreased fall risk during functional activities. Baseline: EVAL=43/56 Goal status: INITIAL   4.  Patient will reduce timed up and go to <11 seconds to reduce fall risk and demonstrate improved transfer/gait ability. Baseline: EVAL=16.42;  04/11/2023= 10 sec without UE support Goal status: MET  5.  Patient will increase 10 meter walk test to >1.45m/s as to improve gait speed for better community ambulation and to reduce fall risk. Baseline: EVAL= 0.69; 04/11/2023= 1.0 m/s without AD.  Goal status: PROGRESSING  6. Patient will increase six minute walk test distance to >1000 for progression to community ambulator and improve gait ability Baseline:03/08/23: 575 ft with progressive shuffling and flexion posture throughout. 04/11/2023= 925 feet with some festinating gait- worse with increased distance.  Goal status: PROGRESSING   ASSESSMENT:  CLINICAL IMPRESSION: Patient presents with good motivation for today's visit. Treatment  focused on posture strengthening and awareness. Patient required multiple VC for correct technique for exercises. He presented with more awareness at end of visit with walking- less overall cues for posture with walking at end of game.   Pt will continue to benefit from skilled physical therapy intervention to address impairments, improve QOL, and attain therapy goals.    OBJECTIVE IMPAIRMENTS: Abnormal gait, decreased activity tolerance, decreased balance, decreased cognition, decreased coordination, decreased endurance, decreased mobility, difficulty walking, decreased ROM, decreased strength, hypomobility, and postural dysfunction.   ACTIVITY LIMITATIONS: carrying, lifting, bending, standing, squatting, and stairs  PARTICIPATION LIMITATIONS: cleaning, shopping, community activity, and yard work  PERSONAL FACTORS: Age and 3+ comorbidities: HTN, Vertigo, arthritis  are also affecting patient's functional outcome.   REHAB POTENTIAL: Good  CLINICAL DECISION MAKING: Stable/uncomplicated  EVALUATION COMPLEXITY: Moderate  PLAN:  PT FREQUENCY: 1-2x/week  PT DURATION: 12 weeks  PLANNED INTERVENTIONS: Therapeutic exercises, Therapeutic activity, Neuromuscular re-education, Balance training, Gait training, Patient/Family education, Self Care, Joint mobilization, Joint manipulation, Stair training, Vestibular training, Canalith repositioning, Orthotic/Fit training, DME instructions, Dry Needling, Spinal manipulation, Spinal mobilization, Cryotherapy, Moist heat, Taping, Manual therapy, and Re-evaluation  PLAN FOR NEXT SESSION:    Progress balance and therex - add to HEP as appropriate.    Lenda Kelp, PT 04/16/2023, 11:27 PM

## 2023-04-18 ENCOUNTER — Ambulatory Visit: Payer: Medicare Other

## 2023-04-18 DIAGNOSIS — M6281 Muscle weakness (generalized): Secondary | ICD-10-CM

## 2023-04-18 DIAGNOSIS — R278 Other lack of coordination: Secondary | ICD-10-CM

## 2023-04-18 DIAGNOSIS — R2689 Other abnormalities of gait and mobility: Secondary | ICD-10-CM

## 2023-04-18 DIAGNOSIS — R269 Unspecified abnormalities of gait and mobility: Secondary | ICD-10-CM | POA: Diagnosis not present

## 2023-04-18 DIAGNOSIS — R262 Difficulty in walking, not elsewhere classified: Secondary | ICD-10-CM

## 2023-04-18 DIAGNOSIS — R2681 Unsteadiness on feet: Secondary | ICD-10-CM

## 2023-04-18 DIAGNOSIS — G20B1 Parkinson's disease with dyskinesia, without mention of fluctuations: Secondary | ICD-10-CM

## 2023-04-18 NOTE — Therapy (Signed)
OUTPATIENT PHYSICAL THERAPY NEURO TREATMENT  Patient Name: Carlos Mathis MRN: [id number] DOB:February 09, 1941, 82 y.o., male Today's Date: 04/18/2023   PCP: Dr. Einar Crow REFERRING PROVIDER: Dr. Cristopher Peru  END OF SESSION:  PT End of Session - 04/18/23 1151     Visit Number 12    Number of Visits 24    Date for PT Re-Evaluation 05/17/23    Progress Note Due on Visit 20    PT Start Time 1146    PT Stop Time 1230    PT Time Calculation (min) 44 min    Equipment Utilized During Treatment Gait belt    Activity Tolerance Patient tolerated treatment well    Behavior During Therapy WFL for tasks assessed/performed                  Past Medical History:  Diagnosis Date   Arthritis    BPH (benign prostatic hyperplasia)    CKD (chronic kidney disease), stage III (HCC)    Coronary artery disease    GERD (gastroesophageal reflux disease)    History of hiatal hernia    HLD (hyperlipidemia)    Hypertension    Long term current use of antithrombotics/antiplatelets    a.) DAPT therapy (ASA+ ticagrelor)   MCI (mild cognitive impairment) with memory loss    a.) takes apoaequorin   Migraines    OSA on CPAP    Pneumonia    Skin cancer, basal cell    STEMI (ST elevation myocardial infarction) (HCC) 07/27/2020   a.) MI while living in [place]; underwent PCI placing 4.0 x 18 mm and 3.5 x 38 mm Resolute Onyx DES (vessels unknown/unspecified).   T2DM (type 2 diabetes mellitus) (HCC)    Vertigo    Wears dentures    Full upper, partial lower   Wears hearing aid in both ears    Past Surgical History:  Procedure Laterality Date   CARDIAC CATHETERIZATION     CATARACT EXTRACTION Right    CATARACT EXTRACTION W/PHACO Left 05/03/2022   Procedure: CATARACT EXTRACTION PHACO AND INTRAOCULAR LENS PLACEMENT (IOC) LEFT DIABETIC;  Surgeon: Galen Manila, MD;  Location: [facility];  Service: Ophthalmology;  Laterality: Left;  14.77 1:24.0   ESOPHAGOGASTRODUODENOSCOPY (EGD)  WITH PROPOFOL N/A 03/21/2023   Procedure: ESOPHAGOGASTRODUODENOSCOPY (EGD) WITH PROPOFOL;  Surgeon: [name], MD;  Location[facility];  Service: Gastroenterology;  Laterality: N/A;   HERNIA REPAIR Left    inguinal   INSERTION OF MESH  09/01/2021   Procedure: INSERTION OF MESH;  Surgeon: Carolan Shiver, MD;  Location: [facility] ORS;  Service: General;;   JOINT REPLACEMENT     tka   UMBILICAL HERNIA REPAIR     Patient Active Problem List   Diagnosis Date Noted   Acute myocardial infarction, unspecified (HCC) 11/16/2022   Anxiety state 11/16/2022   Balanitis 11/16/2022   Benign paroxysmal positional vertigo 11/16/2022   Cardiac dysrhythmia 11/16/2022   Chronic ischemic heart disease 11/16/2022   Chronic sinusitis 11/16/2022   Gastroesophageal reflux disease 11/16/2022   Headache 11/16/2022   Counseling, unspecified 11/16/2022   History of occlusive disease of artery of lower extremity 11/16/2022   Hypertensive chronic kidney disease with stage 1 through stage 4 chronic kidney disease, or unspecified chronic kidney disease 11/16/2022   Irritable bowel syndrome 11/16/2022   Unspecified dementia, mild, without behavioral disturbance, psychotic disturbance, mood disturbance, and anxiety (HCC) 11/16/2022   Obstructive sleep apnea (adult) (pediatric) 11/16/2022   Osteoarthritis of knee 11/16/2022   Personal history of tuberculosis 11/16/2022  Phimosis 11/16/2022   Restless legs 11/16/2022   Sensorineural hearing loss, bilateral 11/16/2022   Type 2 diabetes mellitus without complications (HCC) 11/16/2022   Diabetic polyneuropathy associated with type 2 diabetes mellitus (HCC) 06/24/2021   Healthcare maintenance 06/24/2021   CAD (coronary artery disease) 09/02/2020   Pure hypercholesterolemia 09/02/2020   Polycystic kidney 02/11/2020   Benign prostatic hyperplasia 02/11/2020   Essential hypertension 02/11/2020   Acquired cyst of kidney 07/09/2019   Spinal stenosis of lumbar  region 05/30/2019   Cervical spondylosis 03/12/2019   Dysphagia 03/26/2015   Hypermetropia 02/04/2015   Presbyopia 02/04/2015   Senile nuclear cataract 02/04/2015   Epistaxis 09/23/2014   Laryngopharyngeal reflux (LPR) 09/23/2014    ONSET DATE: worse since Jan 2024  REFERRING DIAG:  Diagnosis  G20.A1 (ICD-10-CM) - Parkinson's disease    THERAPY DIAG:  Abnormality of gait and mobility  Other abnormalities of gait and mobility  Difficulty in walking, not elsewhere classified  Muscle weakness (generalized)  Unsteadiness on feet  Other lack of coordination  Parkinson's disease with dyskinesia, unspecified whether manifestations fluctuate (HCC)  Rationale for Evaluation and Treatment: Rehabilitation  SUBJECTIVE:                                                                                                                                                                                             SUBJECTIVE STATEMENT: Patient reports doing okay- Had uneventful birthday yesterday but going to celebrate later over the weekend with family/friends.   Pt accompanied by: self  PERTINENT HISTORY:   Per neurology note submitted in chart by Janice Coffin, PA.  1. 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 Memory loss starting 2018 with cognitive decline over 2022. Patient reports difficulty remembering conversations, difficulty recognizing old friends, getting lost while driving. Per spouse patient can become aggressive behind the wheel. Patient reports visual hallucinations (dark areas out of his periphery) and audio hallucinations. Wife assists with medications and finances. Has become more emotional over the last year and sometimes becomes angry with spouse which is unusual. Denies alcohol use or  difficulty sleeping. Per wife significant procrastiantion, low motivation, more hunched forward posture, balance issues, and gets going walking and has a difficult time stopping.   Patient was cleared to drive per driving assessment, however, we discussed that memory may not keep up with the physical ability to drive if condition continues to get worse   Ambulatory Referral to Physical Therapy [facility])   2. Peripheral neuropathy- decreased sensation with  some pain in the feet in a patient with history of agent orange exposure.  3. Orthostatic Lightheadedness/Vertigo, chronic episodic vertigo  Orthostatic hypotension (neurogenic) can be seen in many conditions such as parkinsonism, diabetic neuropathy, pure autonomic failure etc. Patient should have work up to rule out other causes first such as dehydration, cardiac issues, iatrogenic due to meds etc. Lightheadedness, fainting, fatigue, blurry vision, weakness etc can happen as symptoms. Patient should drink plenty of water, wear compression stockings, exercise, liberal salts, elevate the head end of the bed etc. Fludrocortisone, Midodrine, droxidopa etc. Can be tried in certain patients.   PAIN:  Are you having pain? No  PRECAUTIONS: Fall  RED FLAGS: None   WEIGHT BEARING RESTRICTIONS: No  FALLS: Has patient fallen in last 6 months? No  LIVING ENVIRONMENT: Lives with: Wife Lives in: House/apartment Stairs: Yes: External: 5 steps; can reach both Has following equipment at home: Single point cane and Walker - 2 wheeled  PLOF: Independent  PATIENT GOALS: I want to be stronger and more steady  OBJECTIVE:   DIAGNOSTIC FINDINGS: CLINICAL DATA:  Provided history: Mild cognitive impairment with memory loss.   EXAM: MRI HEAD WITHOUT CONTRAST   TECHNIQUE: Multiplanar, multiecho pulse sequences of the brain and surrounding structures were obtained without intravenous contrast.   COMPARISON:  Head CT 07/31/2021.    FINDINGS: Brain:   Mild-to-moderate generalized cerebral atrophy. Commensurate prominence of the ventricles and sulci. Comparatively mild cerebellar atrophy.   Multifocal T2 FLAIR hyperintense signal abnormality within the cerebral white matter and pons, nonspecific but compatible with moderate chronic small vessel ischemic disease.   Mild chronic small vessel ischemic changes within the thalami.   There is no acute infarct.   No evidence of an intracranial mass.   No chronic intracranial blood products.   No extra-axial fluid collection.   No midline shift.   Vascular: Maintained flow voids within the proximal large arterial vessels.   Skull and upper cervical spine: No focal suspicious marrow lesion. Degenerative changes and ligamentous hypertrophy at the C1-C2 articulation.   Sinuses/Orbits: No mass or acute finding within the imaged orbits. Prior right ocular lens replacement. Mild mucosal thickening within the bilateral ethmoid and right sphenoid sinuses.   Other: Small-volume fluid within the right mastoid air cells.   IMPRESSION: 1. No evidence of acute intracranial abnormality. 2. Moderate chronic small vessel ischemic changes within the cerebral white matter and pons. 3. Mild chronic small-vessel ischemic changes within the thalami. 4. Mild-to-moderate generalized cerebral atrophy. Comparatively mild cerebellar atrophy. 5. Mild mucosal thickening within the bilateral ethmoid and right sphenoid sinuses. 6. Small-volume fluid within the right mastoid air cells.     Electronically Signed   By: Jackey Loge D.O.   On: 11/11/2021 17:53    COGNITION: Overall cognitive status: Impaired   SENSATION: Light touch: Impaired   COORDINATION: Some difficulty sequencing   POSTURE: forward head   LOWER EXTREMITY MMT:    MMT Right Eval Left Eval  Hip flexion 3+ 4  Hip extension 4 4  Hip abduction 4 4  Hip adduction 4 4  Hip internal rotation 4 4   Hip external rotation 4 4  Knee flexion 4 4  Knee extension 4 4  Ankle dorsiflexion    Ankle plantarflexion    Ankle inversion    Ankle eversion    (Blank rows = not tested)    TRANSFERS: Assistive device utilized: None  Sit to stand: Complete Independence Stand to sit: Complete Independence Chair to chair:  Complete Independence Floor:  Not tested  GAIT: Gait pattern: step through pattern, decreased arm swing- Right, decreased arm swing- Left, decreased step length- Right, and decreased step length- Left Distance walked: 100+ feet Assistive device utilized: None Level of assistance: SBA Comments: Shuffling gait  FUNCTIONAL TESTS:  5 times sit to stand: 18.48 sec without UE Support  Timed up and go (TUG): 16.42 sec without UE support 6 minute walk test: To be tested 2nd visit 10 meter walk test: 14.84 and 14.62 sec  = 0.69 m/s avg without UE support Berg Balance Scale: 43/56  PATIENT SURVEYS:  FOTO 49 with goal of 79  TODAY'S TREATMENT:                                                                                                                              DATE: 04/18/23     Therex:   Octane recumbent stepper- UE/LE  at level 3 x 6 min total- to  promote LE strength/cardioresp endurance. PT sets up intervention, adjusts intensity throughout and monitors pt for response. Cuing for SPM/speed  Single LE step over yoga block with 6# and UE support in // bars  2 sets x 15 reps each LE Standing Hip ABD 6# 2 sets of 10 reps Standing Hip EXT 6# 2 sets of 12 reps  Postural strengthening:  -Horizontal sh abd BTB  2x 10 reps -Scap row with BTB 2 sets of 10 reps  - Shoulder ext with BTB 2 sets of 10 reps  Resistive gait: Matrix cable system 7.5# cable x 15 feet x 5 - Forward- Focusing on increased step length and maintaining erect posture    Throughout session, pt verbose and required moderate cues to maintain attention to task  PATIENT EDUCATION: Education details:  Purpose of PT and plan of care Person educated: Patient Education method: Explanation Education comprehension: verbalized understanding  HOME EXERCISE PROGRAM:   GOALS: Goals reviewed with patient? Yes  SHORT TERM GOALS: Target date: 04/05/2023  Pt will be independent with HEP in order to improve strength and balance in order to decrease fall risk and improve function at home and work.   Baseline: EVAL- No formal HEP In place; 04/11/2023- patient reports walking daily and attempting not to shuffle and going to [place] steady. Goal status: ONGOING   LONG TERM GOALS: Target date: 05/17/2023  1.  Patient (> 32 years old) will complete five times sit to stand test in < 15 seconds indicating an increased LE strength and improved balance. Baseline: EVAL= 18.48 sec without UE support; 04/11/2023= 14.94 sec without UE support Goal status: MET- will keep goal active for now to ensure consistency  2.  Patient will increase FOTO score to equal to or greater than  59   to demonstrate statistically significant improvement in mobility and quality of life.  Baseline: EVAL=49; 04/11/2023= 59 Goal status: PROGRESSING/REVISED   3.  Patient will increase Berg Balance score by > 6 points  to demonstrate decreased fall risk during functional activities. Baseline: EVAL=43/56 Goal status: INITIAL   4.  Patient will reduce timed up and go to <11 seconds to reduce fall risk and demonstrate improved transfer/gait ability. Baseline: EVAL=16.42; 04/11/2023= 10 sec without UE support Goal status: MET  5.  Patient will increase 10 meter walk test to >1.23m/s as to improve gait speed for better community ambulation and to reduce fall risk. Baseline: EVAL= 0.69; 04/11/2023= 1.0 m/s without AD.  Goal status: PROGRESSING  6. Patient will increase six minute walk test distance to >1000 for progression to community ambulator and improve gait ability Baseline:03/08/23: 575 ft with progressive shuffling and flexion  posture throughout. 04/11/2023= 925 feet with some festinating gait- worse with increased distance.  Goal status: PROGRESSING   ASSESSMENT:  CLINICAL IMPRESSION: Pt responded well to treatment today. Pt was able to tolerate tasks with heavier AW, as well as resisted gait. Pt was motivated to increase the intensity of tasks performed in previous visits. Pt remains motivated to improve overall balance and strength, as he is exercising outside of PT as well. Pt will continue to benefit from skilled physical therapy intervention to address impairments, improve QOL, and attain therapy goals.    OBJECTIVE IMPAIRMENTS: Abnormal gait, decreased activity tolerance, decreased balance, decreased cognition, decreased coordination, decreased endurance, decreased mobility, difficulty walking, decreased ROM, decreased strength, hypomobility, and postural dysfunction.   ACTIVITY LIMITATIONS: carrying, lifting, bending, standing, squatting, and stairs  PARTICIPATION LIMITATIONS: cleaning, shopping, community activity, and yard work  PERSONAL FACTORS: Age and 3+ comorbidities: HTN, Vertigo, arthritis  are also affecting patient's functional outcome.   REHAB POTENTIAL: Good  CLINICAL DECISION MAKING: Stable/uncomplicated  EVALUATION COMPLEXITY: Moderate  PLAN:  PT FREQUENCY: 1-2x/week  PT DURATION: 12 weeks  PLANNED INTERVENTIONS: Therapeutic exercises, Therapeutic activity, Neuromuscular re-education, Balance training, Gait training, Patient/Family education, Self Care, Joint mobilization, Joint manipulation, Stair training, Vestibular training, Canalith repositioning, Orthotic/Fit training, DME instructions, Dry Needling, Spinal manipulation, Spinal mobilization, Cryotherapy, Moist heat, Taping, Manual therapy, and Re-evaluation  PLAN FOR NEXT SESSION:    Progress balance and therex - add to HEP as appropriate.    Lenda Kelp, PT 04/18/2023, 2:12 PM

## 2023-04-20 ENCOUNTER — Ambulatory Visit: Payer: Medicare Other

## 2023-04-20 DIAGNOSIS — R278 Other lack of coordination: Secondary | ICD-10-CM

## 2023-04-20 DIAGNOSIS — M6281 Muscle weakness (generalized): Secondary | ICD-10-CM

## 2023-04-20 DIAGNOSIS — R269 Unspecified abnormalities of gait and mobility: Secondary | ICD-10-CM

## 2023-04-20 DIAGNOSIS — G20B1 Parkinson's disease with dyskinesia, without mention of fluctuations: Secondary | ICD-10-CM

## 2023-04-20 DIAGNOSIS — R2689 Other abnormalities of gait and mobility: Secondary | ICD-10-CM

## 2023-04-20 DIAGNOSIS — G20B2 Parkinson's disease with dyskinesia, with fluctuations: Secondary | ICD-10-CM

## 2023-04-20 DIAGNOSIS — R2681 Unsteadiness on feet: Secondary | ICD-10-CM

## 2023-04-20 DIAGNOSIS — R262 Difficulty in walking, not elsewhere classified: Secondary | ICD-10-CM

## 2023-04-20 NOTE — Therapy (Signed)
OUTPATIENT PHYSICAL THERAPY NEURO TREATMENT  Patient Name: Carlos Mathis MRN: [id number] DOB:July 07, 1940, 82 y.o., male Today's Date: 04/20/2023   PCP: Dr. Einar Crow REFERRING PROVIDER: Dr. Cristopher Peru  END OF SESSION:  PT End of Session - 04/20/23 1017     Visit Number 13    Number of Visits 24    Date for PT Re-Evaluation 05/17/23    Authorization Type PT SCREEN    Progress Note Due on Visit 20    PT Start Time 1019    PT Stop Time 1059    PT Time Calculation (min) 40 min    Equipment Utilized During Treatment Gait belt    Activity Tolerance Patient tolerated treatment well    Behavior During Therapy WFL for tasks assessed/performed                  Past Medical History:  Diagnosis Date   Arthritis    BPH (benign prostatic hyperplasia)    CKD (chronic kidney disease), stage III (HCC)    Coronary artery disease    GERD (gastroesophageal reflux disease)    History of hiatal hernia    HLD (hyperlipidemia)    Hypertension    Long term current use of antithrombotics/antiplatelets    a.) DAPT therapy (ASA+ ticagrelor)   MCI (mild cognitive impairment) with memory loss    a.) takes apoaequorin   Migraines    OSA on CPAP    Pneumonia    Skin cancer, basal cell    STEMI (ST elevation myocardial infarction) (HCC) 07/27/2020   a.) MI while living in [place]; underwent PCI placing 4.0 x 18 mm and 3.5 x 38 mm Resolute Onyx DES (vessels unknown/unspecified).   T2DM (type 2 diabetes mellitus) (HCC)    Vertigo    Wears dentures    Full upper, partial lower   Wears hearing aid in both ears    Past Surgical History:  Procedure Laterality Date   CARDIAC CATHETERIZATION     CATARACT EXTRACTION Right    CATARACT EXTRACTION W/PHACO Left 05/03/2022   Procedure: CATARACT EXTRACTION PHACO AND INTRAOCULAR LENS PLACEMENT (IOC) LEFT DIABETIC;  Surgeon: Galen Manila, MD;  Location: [facility];  Service: Ophthalmology;  Laterality: Left;  14.77 1:24.0    ESOPHAGOGASTRODUODENOSCOPY (EGD) WITH PROPOFOL N/A 03/21/2023   Procedure: ESOPHAGOGASTRODUODENOSCOPY (EGD) WITH PROPOFOL;  Surgeon: [name], MD;  Location[facility];  Service: Gastroenterology;  Laterality: N/A;   HERNIA REPAIR Left    inguinal   INSERTION OF MESH  09/01/2021   Procedure: INSERTION OF MESH;  Surgeon: Carolan Shiver, MD;  Location: [facility] ORS;  Service: General;;   JOINT REPLACEMENT     tka   UMBILICAL HERNIA REPAIR     Patient Active Problem List   Diagnosis Date Noted   Acute myocardial infarction, unspecified (HCC) 11/16/2022   Anxiety state 11/16/2022   Balanitis 11/16/2022   Benign paroxysmal positional vertigo 11/16/2022   Cardiac dysrhythmia 11/16/2022   Chronic ischemic heart disease 11/16/2022   Chronic sinusitis 11/16/2022   Gastroesophageal reflux disease 11/16/2022   Headache 11/16/2022   Counseling, unspecified 11/16/2022   History of occlusive disease of artery of lower extremity 11/16/2022   Hypertensive chronic kidney disease with stage 1 through stage 4 chronic kidney disease, or unspecified chronic kidney disease 11/16/2022   Irritable bowel syndrome 11/16/2022   Unspecified dementia, mild, without behavioral disturbance, psychotic disturbance, mood disturbance, and anxiety (HCC) 11/16/2022   Obstructive sleep apnea (adult) (pediatric) 11/16/2022   Osteoarthritis of knee 11/16/2022  Personal history of tuberculosis 11/16/2022   Phimosis 11/16/2022   Restless legs 11/16/2022   Sensorineural hearing loss, bilateral 11/16/2022   Type 2 diabetes mellitus without complications (HCC) 11/16/2022   Diabetic polyneuropathy associated with type 2 diabetes mellitus (HCC) 06/24/2021   Healthcare maintenance 06/24/2021   CAD (coronary artery disease) 09/02/2020   Pure hypercholesterolemia 09/02/2020   Polycystic kidney 02/11/2020   Benign prostatic hyperplasia 02/11/2020   Essential hypertension 02/11/2020   Acquired cyst of kidney  07/09/2019   Spinal stenosis of lumbar region 05/30/2019   Cervical spondylosis 03/12/2019   Dysphagia 03/26/2015   Hypermetropia 02/04/2015   Presbyopia 02/04/2015   Senile nuclear cataract 02/04/2015   Epistaxis 09/23/2014   Laryngopharyngeal reflux (LPR) 09/23/2014    ONSET DATE: worse since Jan 2024  REFERRING DIAG:  Diagnosis  G20.A1 (ICD-10-CM) - Parkinson's disease    THERAPY DIAG:  Abnormality of gait and mobility  Parkinson's disease with dyskinesia and fluctuating manifestations (HCC)  Other abnormalities of gait and mobility  Difficulty in walking, not elsewhere classified  Muscle weakness (generalized)  Unsteadiness on feet  Other lack of coordination  Parkinson's disease with dyskinesia, unspecified whether manifestations fluctuate (HCC)  Rationale for Evaluation and Treatment: Rehabilitation  SUBJECTIVE:                                                                                                                                                                                             SUBJECTIVE STATEMENT: Pt reported no changes since last visit. Pt reported mild low back pain today (3-4/10 on NPS.)  Pt accompanied by: self  PERTINENT HISTORY:   Per neurology note submitted in chart by Janice Coffin, PA.  1. 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 Memory loss starting 2018 with cognitive decline over 2022. Patient reports difficulty remembering conversations, difficulty recognizing old friends, getting lost while driving. Per spouse patient can become aggressive behind the wheel. Patient reports visual hallucinations (dark areas out of his periphery) and audio hallucinations. Wife assists with medications and finances. Has become more emotional over the last year and  sometimes becomes angry with spouse which is unusual. Denies alcohol use or difficulty sleeping. Per wife significant procrastiantion, low motivation, more hunched forward posture, balance issues, and gets going walking and has a difficult time stopping.   Patient was cleared to drive per driving assessment, however, we discussed that memory may not keep up with the physical ability to drive if condition continues to get worse   Ambulatory Referral  to Physical Therapy [facility])   2. Peripheral neuropathy- decreased sensation with some pain in the feet in a patient with history of agent orange exposure.  3. Orthostatic Lightheadedness/Vertigo, chronic episodic vertigo  Orthostatic hypotension (neurogenic) can be seen in many conditions such as parkinsonism, diabetic neuropathy, pure autonomic failure etc. Patient should have work up to rule out other causes first such as dehydration, cardiac issues, iatrogenic due to meds etc. Lightheadedness, fainting, fatigue, blurry vision, weakness etc can happen as symptoms. Patient should drink plenty of water, wear compression stockings, exercise, liberal salts, elevate the head end of the bed etc. Fludrocortisone, Midodrine, droxidopa etc. Can be tried in certain patients.   PAIN:  Are you having pain? No  PRECAUTIONS: Fall  RED FLAGS: None   WEIGHT BEARING RESTRICTIONS: No  FALLS: Has patient fallen in last 6 months? No  LIVING ENVIRONMENT: Lives with: Wife Lives in: House/apartment Stairs: Yes: External: 5 steps; can reach both Has following equipment at home: Single point cane and Walker - 2 wheeled  PLOF: Independent  PATIENT GOALS: I want to be stronger and more steady  OBJECTIVE:   DIAGNOSTIC FINDINGS: CLINICAL DATA:  Provided history: Mild cognitive impairment with memory loss.   EXAM: MRI HEAD WITHOUT CONTRAST   TECHNIQUE: Multiplanar, multiecho pulse sequences of the brain and surrounding structures were obtained  without intravenous contrast.   COMPARISON:  Head CT 07/31/2021.   FINDINGS: Brain:   Mild-to-moderate generalized cerebral atrophy. Commensurate prominence of the ventricles and sulci. Comparatively mild cerebellar atrophy.   Multifocal T2 FLAIR hyperintense signal abnormality within the cerebral white matter and pons, nonspecific but compatible with moderate chronic small vessel ischemic disease.   Mild chronic small vessel ischemic changes within the thalami.   There is no acute infarct.   No evidence of an intracranial mass.   No chronic intracranial blood products.   No extra-axial fluid collection.   No midline shift.   Vascular: Maintained flow voids within the proximal large arterial vessels.   Skull and upper cervical spine: No focal suspicious marrow lesion. Degenerative changes and ligamentous hypertrophy at the C1-C2 articulation.   Sinuses/Orbits: No mass or acute finding within the imaged orbits. Prior right ocular lens replacement. Mild mucosal thickening within the bilateral ethmoid and right sphenoid sinuses.   Other: Small-volume fluid within the right mastoid air cells.   IMPRESSION: 1. No evidence of acute intracranial abnormality. 2. Moderate chronic small vessel ischemic changes within the cerebral white matter and pons. 3. Mild chronic small-vessel ischemic changes within the thalami. 4. Mild-to-moderate generalized cerebral atrophy. Comparatively mild cerebellar atrophy. 5. Mild mucosal thickening within the bilateral ethmoid and right sphenoid sinuses. 6. Small-volume fluid within the right mastoid air cells.     Electronically Signed   By: Jackey Loge D.O.   On: 11/11/2021 17:53    COGNITION: Overall cognitive status: Impaired   SENSATION: Light touch: Impaired   COORDINATION: Some difficulty sequencing   POSTURE: forward head   LOWER EXTREMITY MMT:    MMT Right Eval Left Eval  Hip flexion 3+ 4  Hip extension 4 4   Hip abduction 4 4  Hip adduction 4 4  Hip internal rotation 4 4  Hip external rotation 4 4  Knee flexion 4 4  Knee extension 4 4  Ankle dorsiflexion    Ankle plantarflexion    Ankle inversion    Ankle eversion    (Blank rows = not tested)    TRANSFERS: Assistive device utilized: None  Sit to stand: Complete Independence Stand to sit: Complete Independence Chair to chair: Complete Independence Floor:  Not tested  GAIT: Gait pattern: step through pattern, decreased arm swing- Right, decreased arm swing- Left, decreased step length- Right, and decreased step length- Left Distance walked: 100+ feet Assistive device utilized: None Level of assistance: SBA Comments: Shuffling gait  FUNCTIONAL TESTS:  5 times sit to stand: 18.48 sec without UE Support  Timed up and go (TUG): 16.42 sec without UE support 6 minute walk test: To be tested 2nd visit 10 meter walk test: 14.84 and 14.62 sec  = 0.69 m/s avg without UE support Berg Balance Scale: 43/56  PATIENT SURVEYS:  FOTO 49 with goal of 75  TODAY'S TREATMENT:                                                                                                                              DATE: 04/20/23     Therex:   Octane recumbent stepper- UE/LE  at level 3 x 6 min total- to  promote LE strength/cardioresp endurance. PT sets up intervention, adjusts intensity throughout and monitors pt for response. Cuing for SPM/speed  Single LE step over 2 yoga blocks with 6# and UE support with wall bars 10 reps each direction Standing Hip ABD 6# 2 sets of 12 reps Standing Hip EXT 6# 2 sets of 12 reps  Postural strengthening:  -Horizontal sh abd BTB  2x 10 reps -Scap row with BTB 2 sets of 12 reps  - Shoulder ext with BTB 2 sets of 12 reps  Resistive gait: Matrix cable system 15 feet x 2 reps 7.5#, 2 reps 12.5 # Backwards- Focusing on increased step length and maintaining erect posture  PATIENT EDUCATION: Education details: Purpose of  PT and plan of care Person educated: Patient Education method: Explanation Education comprehension: verbalized understanding  HOME EXERCISE PROGRAM:   GOALS: Goals reviewed with patient? Yes  SHORT TERM GOALS: Target date: 04/05/2023  Pt will be independent with HEP in order to improve strength and balance in order to decrease fall risk and improve function at home and work.   Baseline: EVAL- No formal HEP In place; 04/11/2023- patient reports walking daily and attempting not to shuffle and going to [place] steady. Goal status: ONGOING   LONG TERM GOALS: Target date: 05/17/2023  1.  Patient (> 4 years old) will complete five times sit to stand test in < 15 seconds indicating an increased LE strength and improved balance. Baseline: EVAL= 18.48 sec without UE support; 04/11/2023= 14.94 sec without UE support Goal status: MET- will keep goal active for now to ensure consistency  2.  Patient will increase FOTO score to equal to or greater than  59   to demonstrate statistically significant improvement in mobility and quality of life.  Baseline: EVAL=49; 04/11/2023= 59 Goal status: PROGRESSING/REVISED   3.  Patient will increase Berg Balance score by > 6 points to demonstrate decreased fall  risk during functional activities. Baseline: EVAL=43/56 Goal status: INITIAL   4.  Patient will reduce timed up and go to <11 seconds to reduce fall risk and demonstrate improved transfer/gait ability. Baseline: EVAL=16.42; 04/11/2023= 10 sec without UE support Goal status: MET  5.  Patient will increase 10 meter walk test to >1.23m/s as to improve gait speed for better community ambulation and to reduce fall risk. Baseline: EVAL= 0.69; 04/11/2023= 1.0 m/s without AD.  Goal status: PROGRESSING  6. Patient will increase six minute walk test distance to >1000 for progression to community ambulator and improve gait ability Baseline:03/08/23: 575 ft with progressive shuffling and flexion posture  throughout. 04/11/2023= 925 feet with some festinating gait- worse with increased distance.  Goal status: PROGRESSING   ASSESSMENT:  CLINICAL IMPRESSION: Pt responded well to treatment today. Pt tolerated all tasks during today's session. Despite mild low back pain, pt completed all tasks with no increase in pain. Pt stated that he increased the amount of weight utilized in some of his exercises at the gym yesterday, which shows that he remains motivated to improve overall balance and strength. Pt will continue to benefit from skilled physical therapy intervention to address impairments, improve QOL, and attain therapy goals.    OBJECTIVE IMPAIRMENTS: Abnormal gait, decreased activity tolerance, decreased balance, decreased cognition, decreased coordination, decreased endurance, decreased mobility, difficulty walking, decreased ROM, decreased strength, hypomobility, and postural dysfunction.   ACTIVITY LIMITATIONS: carrying, lifting, bending, standing, squatting, and stairs  PARTICIPATION LIMITATIONS: cleaning, shopping, community activity, and yard work  PERSONAL FACTORS: Age and 3+ comorbidities: HTN, Vertigo, arthritis  are also affecting patient's functional outcome.   REHAB POTENTIAL: Good  CLINICAL DECISION MAKING: Stable/uncomplicated  EVALUATION COMPLEXITY: Moderate  PLAN:  PT FREQUENCY: 1-2x/week  PT DURATION: 12 weeks  PLANNED INTERVENTIONS: Therapeutic exercises, Therapeutic activity, Neuromuscular re-education, Balance training, Gait training, Patient/Family education, Self Care, Joint mobilization, Joint manipulation, Stair training, Vestibular training, Canalith repositioning, Orthotic/Fit training, DME instructions, Dry Needling, Spinal manipulation, Spinal mobilization, Cryotherapy, Moist heat, Taping, Manual therapy, and Re-evaluation  PLAN FOR NEXT SESSION:    Progress balance and therex - add to HEP as appropriate.    Lenda Kelp, PT 04/20/2023, 1:56  PM

## 2023-04-25 ENCOUNTER — Ambulatory Visit: Payer: Medicare Other | Attending: Neurology

## 2023-04-25 DIAGNOSIS — G20B2 Parkinson's disease with dyskinesia, with fluctuations: Secondary | ICD-10-CM | POA: Diagnosis present

## 2023-04-25 DIAGNOSIS — R2689 Other abnormalities of gait and mobility: Secondary | ICD-10-CM | POA: Insufficient documentation

## 2023-04-25 DIAGNOSIS — R278 Other lack of coordination: Secondary | ICD-10-CM | POA: Diagnosis present

## 2023-04-25 DIAGNOSIS — R2681 Unsteadiness on feet: Secondary | ICD-10-CM | POA: Insufficient documentation

## 2023-04-25 DIAGNOSIS — M6281 Muscle weakness (generalized): Secondary | ICD-10-CM | POA: Insufficient documentation

## 2023-04-25 DIAGNOSIS — R262 Difficulty in walking, not elsewhere classified: Secondary | ICD-10-CM | POA: Diagnosis present

## 2023-04-25 DIAGNOSIS — R269 Unspecified abnormalities of gait and mobility: Secondary | ICD-10-CM | POA: Diagnosis present

## 2023-04-25 DIAGNOSIS — G20B1 Parkinson's disease with dyskinesia, without mention of fluctuations: Secondary | ICD-10-CM | POA: Diagnosis present

## 2023-04-25 NOTE — Therapy (Signed)
OUTPATIENT PHYSICAL THERAPY NEURO TREATMENT  Patient Name: Carlos Mathis MRN: [id number] DOB:1940-08-05, 82 y.o., male Today's Date: 04/25/2023   PCP: Dr. Einar Crow REFERRING PROVIDER: Dr. Cristopher Peru  END OF SESSION:  PT End of Session - 04/25/23 1150     Visit Number 14    Number of Visits 24    Date for PT Re-Evaluation 05/17/23    Progress Note Due on Visit 20    PT Start Time 1145    PT Stop Time 1232    PT Time Calculation (min) 47 min    Equipment Utilized During Treatment Gait belt    Activity Tolerance Patient tolerated treatment well    Behavior During Therapy WFL for tasks assessed/performed                  Past Medical History:  Diagnosis Date   Arthritis    BPH (benign prostatic hyperplasia)    CKD (chronic kidney disease), stage III (HCC)    Coronary artery disease    GERD (gastroesophageal reflux disease)    History of hiatal hernia    HLD (hyperlipidemia)    Hypertension    Long term current use of antithrombotics/antiplatelets    a.) DAPT therapy (ASA+ ticagrelor)   MCI (mild cognitive impairment) with memory loss    a.) takes apoaequorin   Migraines    OSA on CPAP    Pneumonia    Skin cancer, basal cell    STEMI (ST elevation myocardial infarction) (HCC) 07/27/2020   a.) MI while living in [place]; underwent PCI placing 4.0 x 18 mm and 3.5 x 38 mm Resolute Onyx DES (vessels unknown/unspecified).   T2DM (type 2 diabetes mellitus) (HCC)    Vertigo    Wears dentures    Full upper, partial lower   Wears hearing aid in both ears    Past Surgical History:  Procedure Laterality Date   CARDIAC CATHETERIZATION     CATARACT EXTRACTION Right    CATARACT EXTRACTION W/PHACO Left 05/03/2022   Procedure: CATARACT EXTRACTION PHACO AND INTRAOCULAR LENS PLACEMENT (IOC) LEFT DIABETIC;  Surgeon: Galen Manila, MD;  Location: [facility];  Service: Ophthalmology;  Laterality: Left;  14.77 1:24.0   ESOPHAGOGASTRODUODENOSCOPY (EGD)  WITH PROPOFOL N/A 03/21/2023   Procedure: ESOPHAGOGASTRODUODENOSCOPY (EGD) WITH PROPOFOL;  Surgeon: [name], MD;  Location[facility];  Service: Gastroenterology;  Laterality: N/A;   HERNIA REPAIR Left    inguinal   INSERTION OF MESH  09/01/2021   Procedure: INSERTION OF MESH;  Surgeon: Carolan Shiver, MD;  Location: [facility];  Service: General;;   JOINT REPLACEMENT     tka   UMBILICAL HERNIA REPAIR     Patient Active Problem List   Diagnosis Date Noted   Acute myocardial infarction, unspecified (HCC) 11/16/2022   Anxiety state 11/16/2022   Balanitis 11/16/2022   Benign paroxysmal positional vertigo 11/16/2022   Cardiac dysrhythmia 11/16/2022   Chronic ischemic heart disease 11/16/2022   Chronic sinusitis 11/16/2022   Gastroesophageal reflux disease 11/16/2022   Headache 11/16/2022   Counseling, unspecified 11/16/2022   History of occlusive disease of artery of lower extremity 11/16/2022   Hypertensive chronic kidney disease with stage 1 through stage 4 chronic kidney disease, or unspecified chronic kidney disease 11/16/2022   Irritable bowel syndrome 11/16/2022   Unspecified dementia, mild, without behavioral disturbance, psychotic disturbance, mood disturbance, and anxiety (HCC) 11/16/2022   Obstructive sleep apnea (adult) (pediatric) 11/16/2022   Osteoarthritis of knee 11/16/2022   Personal history of tuberculosis 11/16/2022  Phimosis 11/16/2022   Restless legs 11/16/2022   Sensorineural hearing loss, bilateral 11/16/2022   Type 2 diabetes mellitus without complications (HCC) 11/16/2022   Diabetic polyneuropathy associated with type 2 diabetes mellitus (HCC) 06/24/2021   Healthcare maintenance 06/24/2021   CAD (coronary artery disease) 09/02/2020   Pure hypercholesterolemia 09/02/2020   Polycystic kidney 02/11/2020   Benign prostatic hyperplasia 02/11/2020   Essential hypertension 02/11/2020   Acquired cyst of kidney 07/09/2019   Spinal stenosis of lumbar  region 05/30/2019   Cervical spondylosis 03/12/2019   Dysphagia 03/26/2015   Hypermetropia 02/04/2015   Presbyopia 02/04/2015   Senile nuclear cataract 02/04/2015   Epistaxis 09/23/2014   Laryngopharyngeal reflux (LPR) 09/23/2014    ONSET DATE: worse since Jan 2024  REFERRING DIAG:  Diagnosis  G20.A1 (ICD-10-CM) - Parkinson's disease    THERAPY DIAG:  Abnormality of gait and mobility  Parkinson's disease with dyskinesia and fluctuating manifestations (HCC)  Other abnormalities of gait and mobility  Difficulty in walking, not elsewhere classified  Muscle weakness (generalized)  Unsteadiness on feet  Other lack of coordination  Parkinson's disease with dyskinesia, unspecified whether manifestations fluctuate (HCC)  Rationale for Evaluation and Treatment: Rehabilitation  SUBJECTIVE:                                                                                                                                                                                             SUBJECTIVE STATEMENT: Pt reports no issues since last visit and states no pain today.   Pt accompanied by: self  PERTINENT HISTORY:   Per neurology note submitted in chart by Janice Coffin, PA.  1. 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 Memory loss starting 2018 with cognitive decline over 2022. Patient reports difficulty remembering conversations, difficulty recognizing old friends, getting lost while driving. Per spouse patient can become aggressive behind the wheel. Patient reports visual hallucinations (dark areas out of his periphery) and audio hallucinations. Wife assists with medications and finances. Has become more emotional over the last year and sometimes becomes angry with spouse which is unusual. Denies alcohol use  or difficulty sleeping. Per wife significant procrastiantion, low motivation, more hunched forward posture, balance issues, and gets going walking and has a difficult time stopping.   Patient was cleared to drive per driving assessment, however, we discussed that memory may not keep up with the physical ability to drive if condition continues to get worse   Ambulatory Referral to Physical Therapy [facility])   2. Peripheral neuropathy-  decreased sensation with some pain in the feet in a patient with history of agent orange exposure.  3. Orthostatic Lightheadedness/Vertigo, chronic episodic vertigo  Orthostatic hypotension (neurogenic) can be seen in many conditions such as parkinsonism, diabetic neuropathy, pure autonomic failure etc. Patient should have work up to rule out other causes first such as dehydration, cardiac issues, iatrogenic due to meds etc. Lightheadedness, fainting, fatigue, blurry vision, weakness etc can happen as symptoms. Patient should drink plenty of water, wear compression stockings, exercise, liberal salts, elevate the head end of the bed etc. Fludrocortisone, Midodrine, droxidopa etc. Can be tried in certain patients.   PAIN:  Are you having pain? No  PRECAUTIONS: Fall  RED FLAGS: None   WEIGHT BEARING RESTRICTIONS: No  FALLS: Has patient fallen in last 6 months? No  LIVING ENVIRONMENT: Lives with: Wife Lives in: House/apartment Stairs: Yes: External: 5 steps; can reach both Has following equipment at home: Single point cane and Walker - 2 wheeled  PLOF: Independent  PATIENT GOALS: I want to be stronger and more steady  OBJECTIVE:   DIAGNOSTIC FINDINGS: CLINICAL DATA:  Provided history: Mild cognitive impairment with memory loss.   EXAM: MRI HEAD WITHOUT CONTRAST   TECHNIQUE: Multiplanar, multiecho pulse sequences of the brain and surrounding structures were obtained without intravenous contrast.   COMPARISON:  Head CT 07/31/2021.    FINDINGS: Brain:   Mild-to-moderate generalized cerebral atrophy. Commensurate prominence of the ventricles and sulci. Comparatively mild cerebellar atrophy.   Multifocal T2 FLAIR hyperintense signal abnormality within the cerebral white matter and pons, nonspecific but compatible with moderate chronic small vessel ischemic disease.   Mild chronic small vessel ischemic changes within the thalami.   There is no acute infarct.   No evidence of an intracranial mass.   No chronic intracranial blood products.   No extra-axial fluid collection.   No midline shift.   Vascular: Maintained flow voids within the proximal large arterial vessels.   Skull and upper cervical spine: No focal suspicious marrow lesion. Degenerative changes and ligamentous hypertrophy at the C1-C2 articulation.   Sinuses/Orbits: No mass or acute finding within the imaged orbits. Prior right ocular lens replacement. Mild mucosal thickening within the bilateral ethmoid and right sphenoid sinuses.   Other: Small-volume fluid within the right mastoid air cells.   IMPRESSION: 1. No evidence of acute intracranial abnormality. 2. Moderate chronic small vessel ischemic changes within the cerebral white matter and pons. 3. Mild chronic small-vessel ischemic changes within the thalami. 4. Mild-to-moderate generalized cerebral atrophy. Comparatively mild cerebellar atrophy. 5. Mild mucosal thickening within the bilateral ethmoid and right sphenoid sinuses. 6. Small-volume fluid within the right mastoid air cells.     Electronically Signed   By: Jackey Loge D.O.   On: 11/11/2021 17:53    COGNITION: Overall cognitive status: Impaired   SENSATION: Light touch: Impaired   COORDINATION: Some difficulty sequencing   POSTURE: forward head   LOWER EXTREMITY MMT:    MMT Right Eval Left Eval  Hip flexion 3+ 4  Hip extension 4 4  Hip abduction 4 4  Hip adduction 4 4  Hip internal rotation 4 4   Hip external rotation 4 4  Knee flexion 4 4  Knee extension 4 4  Ankle dorsiflexion    Ankle plantarflexion    Ankle inversion    Ankle eversion    (Blank rows = not tested)    TRANSFERS: Assistive device utilized: None  Sit to stand: Complete Independence Stand to sit: Complete Independence  Chair to chair: Complete Independence Floor:  Not tested  GAIT: Gait pattern: step through pattern, decreased arm swing- Right, decreased arm swing- Left, decreased step length- Right, and decreased step length- Left Distance walked: 100+ feet Assistive device utilized: None Level of assistance: SBA Comments: Shuffling gait  FUNCTIONAL TESTS:  5 times sit to stand: 18.48 sec without UE Support  Timed up and go (TUG): 16.42 sec without UE support 6 minute walk test: To be tested 2nd visit 10 meter walk test: 14.84 and 14.62 sec  = 0.69 m/s avg without UE support Berg Balance Scale: 43/56  PATIENT SURVEYS:  FOTO 49 with goal of 31  TODAY'S TREATMENT:                                                                                                                              DATE: 04/25/23     Therex:   Octane recumbent stepper- UE/LE  at level 3 x 6 min total- to  promote LE strength/cardioresp endurance. PT sets up intervention, adjusts intensity throughout and monitors pt for response. Cuing for SPM/speed  Standing Hip ABD 6# 2 sets of 12 reps Standing Hip EXT 6# 2 sets of 12 reps Standing running man posture- focusing on abdominal bracing x 10 reps each LE  Postural strengthening:  -Horizontal sh abd BTB  2x 10 reps -Scap row with BTB 2 sets of 12 reps  - Shoulder ext with BTB 2 sets of 12 reps  Resistive gait: Matrix cable system 15 feet x 5 reps 7.5# Backwards- Focusing on increased step length and maintaining erect posture  Resistive Heel to toe activity- BTB around ankle- swing Leg from toe off to heel strike to 1/2 foam roll x 10 reps each LE.   PATIENT  EDUCATION: Education details: Purpose of PT and plan of care Person educated: Patient Education method: Explanation Education comprehension: verbalized understanding  HOME EXERCISE PROGRAM:   GOALS: Goals reviewed with patient? Yes  SHORT TERM GOALS: Target date: 04/05/2023  Pt will be independent with HEP in order to improve strength and balance in order to decrease fall risk and improve function at home and work.   Baseline: EVAL- No formal HEP In place; 04/11/2023- patient reports walking daily and attempting not to shuffle and going to [place] steady. Goal status: ONGOING   LONG TERM GOALS: Target date: 05/17/2023  1.  Patient (> 58 years old) will complete five times sit to stand test in < 15 seconds indicating an increased LE strength and improved balance. Baseline: EVAL= 18.48 sec without UE support; 04/11/2023= 14.94 sec without UE support Goal status: MET- will keep goal active for now to ensure consistency  2.  Patient will increase FOTO score to equal to or greater than  59   to demonstrate statistically significant improvement in mobility and quality of life.  Baseline: EVAL=49; 04/11/2023= 59 Goal status: PROGRESSING/REVISED   3.  Patient will increase Berg Balance score by >  6 points to demonstrate decreased fall risk during functional activities. Baseline: EVAL=43/56 Goal status: INITIAL   4.  Patient will reduce timed up and go to <11 seconds to reduce fall risk and demonstrate improved transfer/gait ability. Baseline: EVAL=16.42; 04/11/2023= 10 sec without UE support Goal status: MET  5.  Patient will increase 10 meter walk test to >1.57m/s as to improve gait speed for better community ambulation and to reduce fall risk. Baseline: EVAL= 0.69; 04/11/2023= 1.0 m/s without AD.  Goal status: PROGRESSING  6. Patient will increase six minute walk test distance to >1000 for progression to community ambulator and improve gait ability Baseline:03/08/23: 575 ft with  progressive shuffling and flexion posture throughout. 04/11/2023= 925 feet with some festinating gait- worse with increased distance.  Goal status: PROGRESSING   ASSESSMENT:  CLINICAL IMPRESSION: Treatment continues to focus on LE strengthening to improve his step length and overall stamina. He presents as well motivated and able to follow all appropriate cues to perform therex correctly. He continues to respond well to increased resistance and able to take larger steps on command but still requires VC and reverts back to shuffling without provided cues. Pt will continue to benefit from skilled physical therapy intervention to address impairments, improve QOL, and attain therapy goals.    OBJECTIVE IMPAIRMENTS: Abnormal gait, decreased activity tolerance, decreased balance, decreased cognition, decreased coordination, decreased endurance, decreased mobility, difficulty walking, decreased ROM, decreased strength, hypomobility, and postural dysfunction.   ACTIVITY LIMITATIONS: carrying, lifting, bending, standing, squatting, and stairs  PARTICIPATION LIMITATIONS: cleaning, shopping, community activity, and yard work  PERSONAL FACTORS: Age and 3+ comorbidities: HTN, Vertigo, arthritis  are also affecting patient's functional outcome.   REHAB POTENTIAL: Good  CLINICAL DECISION MAKING: Stable/uncomplicated  EVALUATION COMPLEXITY: Moderate  PLAN:  PT FREQUENCY: 1-2x/week  PT DURATION: 12 weeks  PLANNED INTERVENTIONS: Therapeutic exercises, Therapeutic activity, Neuromuscular re-education, Balance training, Gait training, Patient/Family education, Self Care, Joint mobilization, Joint manipulation, Stair training, Vestibular training, Canalith repositioning, Orthotic/Fit training, DME instructions, Dry Needling, Spinal manipulation, Spinal mobilization, Cryotherapy, Moist heat, Taping, Manual therapy, and Re-evaluation  PLAN FOR NEXT SESSION:    Progress balance and therex - add to HEP as  appropriate.    Debara Pickett, SPT  This entire session was performed under direct supervision and direction of a licensed Estate agent . I have personally read, edited and approve of the note as written.   Lenda Kelp, PT 04/25/2023, 3:54 PM

## 2023-04-27 ENCOUNTER — Ambulatory Visit: Payer: Medicare Other

## 2023-04-27 ENCOUNTER — Ambulatory Visit: Payer: Medicare Other | Admitting: Gastroenterology

## 2023-04-27 ENCOUNTER — Encounter: Payer: Self-pay | Admitting: Gastroenterology

## 2023-04-27 VITALS — BP 132/74 | HR 90 | Temp 98.1°F | Wt 191.4 lb

## 2023-04-27 DIAGNOSIS — R1110 Vomiting, unspecified: Secondary | ICD-10-CM

## 2023-04-27 DIAGNOSIS — G20B2 Parkinson's disease with dyskinesia, with fluctuations: Secondary | ICD-10-CM

## 2023-04-27 DIAGNOSIS — R140 Abdominal distension (gaseous): Secondary | ICD-10-CM | POA: Diagnosis not present

## 2023-04-27 DIAGNOSIS — R2681 Unsteadiness on feet: Secondary | ICD-10-CM

## 2023-04-27 DIAGNOSIS — R1319 Other dysphagia: Secondary | ICD-10-CM

## 2023-04-27 DIAGNOSIS — R278 Other lack of coordination: Secondary | ICD-10-CM

## 2023-04-27 DIAGNOSIS — G20B1 Parkinson's disease with dyskinesia, without mention of fluctuations: Secondary | ICD-10-CM

## 2023-04-27 DIAGNOSIS — M6281 Muscle weakness (generalized): Secondary | ICD-10-CM

## 2023-04-27 DIAGNOSIS — R269 Unspecified abnormalities of gait and mobility: Secondary | ICD-10-CM

## 2023-04-27 DIAGNOSIS — R262 Difficulty in walking, not elsewhere classified: Secondary | ICD-10-CM

## 2023-04-27 DIAGNOSIS — R2689 Other abnormalities of gait and mobility: Secondary | ICD-10-CM

## 2023-04-27 NOTE — Therapy (Signed)
OUTPATIENT PHYSICAL THERAPY NEURO TREATMENT  Patient Name: Carlos Mathis MRN: [id number] DOB:April 18, 1941, 82 y.o., male Today's Date: 04/27/2023   PCP: Dr. Einar Crow REFERRING PROVIDER: Dr. Cristopher Peru  END OF SESSION:  PT End of Session - 04/27/23 1107     Visit Number 15    Number of Visits 24    Date for PT Re-Evaluation 05/17/23    Authorization Type PT SCREEN    Progress Note Due on Visit 20    PT Start Time 1104    PT Stop Time 1142    PT Time Calculation (min) 38 min    Equipment Utilized During Treatment Gait belt    Activity Tolerance Patient tolerated treatment well    Behavior During Therapy WFL for tasks assessed/performed                  Past Medical History:  Diagnosis Date   Arthritis    BPH (benign prostatic hyperplasia)    CKD (chronic kidney disease), stage III (HCC)    Coronary artery disease    GERD (gastroesophageal reflux disease)    History of hiatal hernia    HLD (hyperlipidemia)    Hypertension    Long term current use of antithrombotics/antiplatelets    a.) DAPT therapy (ASA+ ticagrelor)   MCI (mild cognitive impairment) with memory loss    a.) takes apoaequorin   Migraines    OSA on CPAP    Pneumonia    Skin cancer, basal cell    STEMI (ST elevation myocardial infarction) (HCC) 07/27/2020   a.) MI while living in [place]; underwent PCI placing 4.0 x 18 mm and 3.5 x 38 mm Resolute Onyx DES (vessels unknown/unspecified).   T2DM (type 2 diabetes mellitus) (HCC)    Vertigo    Wears dentures    Full upper, partial lower   Wears hearing aid in both ears    Past Surgical History:  Procedure Laterality Date   CARDIAC CATHETERIZATION     CATARACT EXTRACTION Right    CATARACT EXTRACTION W/PHACO Left 05/03/2022   Procedure: CATARACT EXTRACTION PHACO AND INTRAOCULAR LENS PLACEMENT (IOC) LEFT DIABETIC;  Surgeon: Galen Manila, MD;  Location: [facility];  Service: Ophthalmology;  Laterality: Left;  14.77 1:24.0    ESOPHAGOGASTRODUODENOSCOPY (EGD) WITH PROPOFOL N/A 03/21/2023   Procedure: ESOPHAGOGASTRODUODENOSCOPY (EGD) WITH PROPOFOL;  Surgeon: [name], MD;  Location: [facility];  Service: Gastroenterology;  Laterality: N/A;   HERNIA REPAIR Left    inguinal   INSERTION OF MESH  09/01/2021   Procedure: INSERTION OF MESH;  Surgeon: Carolan Shiver, MD;  Location: [facility] ORS;  Service: General;;   JOINT REPLACEMENT     tka   UMBILICAL HERNIA REPAIR     Patient Active Problem List   Diagnosis Date Noted   Acute myocardial infarction, unspecified (HCC) 11/16/2022   Anxiety state 11/16/2022   Balanitis 11/16/2022   Benign paroxysmal positional vertigo 11/16/2022   Cardiac dysrhythmia 11/16/2022   Chronic ischemic heart disease 11/16/2022   Chronic sinusitis 11/16/2022   Gastroesophageal reflux disease 11/16/2022   Headache 11/16/2022   Counseling, unspecified 11/16/2022   History of occlusive disease of artery of lower extremity 11/16/2022   Hypertensive chronic kidney disease with stage 1 through stage 4 chronic kidney disease, or unspecified chronic kidney disease 11/16/2022   Irritable bowel syndrome 11/16/2022   Unspecified dementia, mild, without behavioral disturbance, psychotic disturbance, mood disturbance, and anxiety (HCC) 11/16/2022   Obstructive sleep apnea (adult) (pediatric) 11/16/2022   Osteoarthritis of knee 11/16/2022  Personal history of tuberculosis 11/16/2022   Phimosis 11/16/2022   Restless legs 11/16/2022   Sensorineural hearing loss, bilateral 11/16/2022   Type 2 diabetes mellitus without complications (HCC) 11/16/2022   Diabetic polyneuropathy associated with type 2 diabetes mellitus (HCC) 06/24/2021   Healthcare maintenance 06/24/2021   CAD (coronary artery disease) 09/02/2020   Pure hypercholesterolemia 09/02/2020   Polycystic kidney 02/11/2020   Benign prostatic hyperplasia 02/11/2020   Essential hypertension 02/11/2020   Acquired cyst of kidney  07/09/2019   Spinal stenosis of lumbar region 05/30/2019   Cervical spondylosis 03/12/2019   Dysphagia 03/26/2015   Hypermetropia 02/04/2015   Presbyopia 02/04/2015   Senile nuclear cataract 02/04/2015   Epistaxis 09/23/2014   Laryngopharyngeal reflux (LPR) 09/23/2014    ONSET DATE: worse since Jan 2024  REFERRING DIAG:  Diagnosis  G20.A1 (ICD-10-CM) - Parkinson's disease    THERAPY DIAG:  Abnormality of gait and mobility  Difficulty in walking, not elsewhere classified  Other lack of coordination  Parkinson's disease with dyskinesia, unspecified whether manifestations fluctuate (HCC)  Muscle weakness (generalized)  Parkinson's disease with dyskinesia and fluctuating manifestations (HCC)  Other abnormalities of gait and mobility  Unsteadiness on feet  Rationale for Evaluation and Treatment: Rehabilitation  SUBJECTIVE:                                                                                                                                                                                             SUBJECTIVE STATEMENT: Pt reported a slight headache today, but no pain other than that. Pt reported not sleeping well last night.    Pt accompanied by: self  PERTINENT HISTORY:   Per neurology note submitted in chart by Janice Coffin, PA.  1. 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 Memory loss starting 2018 with cognitive decline over 2022. Patient reports difficulty remembering conversations, difficulty recognizing old friends, getting lost while driving. Per spouse patient can become aggressive behind the wheel. Patient reports visual hallucinations (dark areas out of his periphery) and audio hallucinations. Wife assists with medications and finances. Has become more emotional over the  last year and sometimes becomes angry with spouse which is unusual. Denies alcohol use or difficulty sleeping. Per wife significant procrastiantion, low motivation, more hunched forward posture, balance issues, and gets going walking and has a difficult time stopping.   Patient was cleared to drive per driving assessment, however, we discussed that memory may not keep up with the physical ability to drive if condition continues to get worse  Ambulatory Referral to Physical Therapy [facility])   2. Peripheral neuropathy- decreased sensation with some pain in the feet in a patient with history of agent orange exposure.  3. Orthostatic Lightheadedness/Vertigo, chronic episodic vertigo  Orthostatic hypotension (neurogenic) can be seen in many conditions such as parkinsonism, diabetic neuropathy, pure autonomic failure etc. Patient should have work up to rule out other causes first such as dehydration, cardiac issues, iatrogenic due to meds etc. Lightheadedness, fainting, fatigue, blurry vision, weakness etc can happen as symptoms. Patient should drink plenty of water, wear compression stockings, exercise, liberal salts, elevate the head end of the bed etc. Fludrocortisone, Midodrine, droxidopa etc. Can be tried in certain patients.   PAIN:  Are you having pain? No  PRECAUTIONS: Fall  RED FLAGS: None   WEIGHT BEARING RESTRICTIONS: No  FALLS: Has patient fallen in last 6 months? No  LIVING ENVIRONMENT: Lives with: Wife Lives in: House/apartment Stairs: Yes: External: 5 steps; can reach both Has following equipment at home: Single point cane and Walker - 2 wheeled  PLOF: Independent  PATIENT GOALS: I want to be stronger and more steady  OBJECTIVE:   DIAGNOSTIC FINDINGS: CLINICAL DATA:  Provided history: Mild cognitive impairment with memory loss.   EXAM: MRI HEAD WITHOUT CONTRAST   TECHNIQUE: Multiplanar, multiecho pulse sequences of the brain and surrounding structures were  obtained without intravenous contrast.   COMPARISON:  Head CT 07/31/2021.   FINDINGS: Brain:   Mild-to-moderate generalized cerebral atrophy. Commensurate prominence of the ventricles and sulci. Comparatively mild cerebellar atrophy.   Multifocal T2 FLAIR hyperintense signal abnormality within the cerebral white matter and pons, nonspecific but compatible with moderate chronic small vessel ischemic disease.   Mild chronic small vessel ischemic changes within the thalami.   There is no acute infarct.   No evidence of an intracranial mass.   No chronic intracranial blood products.   No extra-axial fluid collection.   No midline shift.   Vascular: Maintained flow voids within the proximal large arterial vessels.   Skull and upper cervical spine: No focal suspicious marrow lesion. Degenerative changes and ligamentous hypertrophy at the C1-C2 articulation.   Sinuses/Orbits: No mass or acute finding within the imaged orbits. Prior right ocular lens replacement. Mild mucosal thickening within the bilateral ethmoid and right sphenoid sinuses.   Other: Small-volume fluid within the right mastoid air cells.   IMPRESSION: 1. No evidence of acute intracranial abnormality. 2. Moderate chronic small vessel ischemic changes within the cerebral white matter and pons. 3. Mild chronic small-vessel ischemic changes within the thalami. 4. Mild-to-moderate generalized cerebral atrophy. Comparatively mild cerebellar atrophy. 5. Mild mucosal thickening within the bilateral ethmoid and right sphenoid sinuses. 6. Small-volume fluid within the right mastoid air cells.     Electronically Signed   By: Jackey Loge D.O.   On: 11/11/2021 17:53    COGNITION: Overall cognitive status: Impaired   SENSATION: Light touch: Impaired   COORDINATION: Some difficulty sequencing   POSTURE: forward head   LOWER EXTREMITY MMT:    MMT Right Eval Left Eval  Hip flexion 3+ 4  Hip  extension 4 4  Hip abduction 4 4  Hip adduction 4 4  Hip internal rotation 4 4  Hip external rotation 4 4  Knee flexion 4 4  Knee extension 4 4  Ankle dorsiflexion    Ankle plantarflexion    Ankle inversion    Ankle eversion    (Blank rows = not tested)    TRANSFERS: Assistive device  utilized: None  Sit to stand: Complete Independence Stand to sit: Complete Independence Chair to chair: Complete Independence Floor:  Not tested  GAIT: Gait pattern: step through pattern, decreased arm swing- Right, decreased arm swing- Left, decreased step length- Right, and decreased step length- Left Distance walked: 100+ feet Assistive device utilized: None Level of assistance: SBA Comments: Shuffling gait  FUNCTIONAL TESTS:  5 times sit to stand: 18.48 sec without UE Support  Timed up and go (TUG): 16.42 sec without UE support 6 minute walk test: To be tested 2nd visit 10 meter walk test: 14.84 and 14.62 sec  = 0.69 m/s avg without UE support Berg Balance Scale: 43/56  PATIENT SURVEYS:  FOTO 49 with goal of 36  TODAY'S TREATMENT:                                                                                                                              DATE: 04/27/23     Therex:   Octane recumbent stepper- UE/LE  at level 3 x 6 min total- to  promote LE strength/cardioresp endurance. PT sets up intervention, adjusts intensity throughout and monitors pt for response. Cuing for SPM/speed  Standing Hip ABD 6# 2 sets of 12 reps Standing Hip EXT 6# 2 sets of 12 reps  Postural strengthening:  -Horizontal sh abd BTB  2x 10 reps -Scap row with BTB 2 sets of 12 reps  - Shoulder ext with BTB 2 sets of 12 reps  Resistive gait: Matrix cable system 15 feet x 5 reps 12.5# Backwards- Focusing on increased step length and maintaining erect posture  NMR:   Forward gait while punching punch mitts 2 rounds 10 meters 2 rounds 10 meters with cognitive task involving VC's of which mitt to  punch while walking    PATIENT EDUCATION: Education details: Purpose of PT and plan of care Person educated: Patient Education method: Explanation Education comprehension: verbalized understanding  HOME EXERCISE PROGRAM:   GOALS: Goals reviewed with patient? Yes  SHORT TERM GOALS: Target date: 04/05/2023  Pt will be independent with HEP in order to improve strength and balance in order to decrease fall risk and improve function at home and work.   Baseline: EVAL- No formal HEP In place; 04/11/2023- patient reports walking daily and attempting not to shuffle and going to [place] steady. Goal status: ONGOING   LONG TERM GOALS: Target date: 05/17/2023  1.  Patient (> 56 years old) will complete five times sit to stand test in < 15 seconds indicating an increased LE strength and improved balance. Baseline: EVAL= 18.48 sec without UE support; 04/11/2023= 14.94 sec without UE support Goal status: MET- will keep goal active for now to ensure consistency  2.  Patient will increase FOTO score to equal to or greater than  59   to demonstrate statistically significant improvement in mobility and quality of life.  Baseline: EVAL=49; 04/11/2023= 59 Goal status: PROGRESSING/REVISED   3.  Patient will  increase Berg Balance score by > 6 points to demonstrate decreased fall risk during functional activities. Baseline: EVAL=43/56 Goal status: INITIAL   4.  Patient will reduce timed up and go to <11 seconds to reduce fall risk and demonstrate improved transfer/gait ability. Baseline: EVAL=16.42; 04/11/2023= 10 sec without UE support Goal status: MET  5.  Patient will increase 10 meter walk test to >1.71m/s as to improve gait speed for better community ambulation and to reduce fall risk. Baseline: EVAL= 0.69; 04/11/2023= 1.0 m/s without AD.  Goal status: PROGRESSING  6. Patient will increase six minute walk test distance to >1000 for progression to community ambulator and improve gait  ability Baseline:03/08/23: 575 ft with progressive shuffling and flexion posture throughout. 04/11/2023= 925 feet with some festinating gait- worse with increased distance.  Goal status: PROGRESSING   ASSESSMENT:  CLINICAL IMPRESSION: Pt responded well to treatment today. Pt tolerated all tasks during today's visit. During today's visit, pt completed NMR involving utilizing boxing gloves to punch punching mitts, with some trials incorporating a cognitive task. This was done to improve maintaining postural extension during gait. Pt will continue to benefit from skilled physical therapy intervention to address impairments, improve QOL, and attain therapy goals.    OBJECTIVE IMPAIRMENTS: Abnormal gait, decreased activity tolerance, decreased balance, decreased cognition, decreased coordination, decreased endurance, decreased mobility, difficulty walking, decreased ROM, decreased strength, hypomobility, and postural dysfunction.   ACTIVITY LIMITATIONS: carrying, lifting, bending, standing, squatting, and stairs  PARTICIPATION LIMITATIONS: cleaning, shopping, community activity, and yard work  PERSONAL FACTORS: Age and 3+ comorbidities: HTN, Vertigo, arthritis  are also affecting patient's functional outcome.   REHAB POTENTIAL: Good  CLINICAL DECISION MAKING: Stable/uncomplicated  EVALUATION COMPLEXITY: Moderate  PLAN:  PT FREQUENCY: 1-2x/week  PT DURATION: 12 weeks  PLANNED INTERVENTIONS: Therapeutic exercises, Therapeutic activity, Neuromuscular re-education, Balance training, Gait training, Patient/Family education, Self Care, Joint mobilization, Joint manipulation, Stair training, Vestibular training, Canalith repositioning, Orthotic/Fit training, DME instructions, Dry Needling, Spinal manipulation, Spinal mobilization, Cryotherapy, Moist heat, Taping, Manual therapy, and Re-evaluation  PLAN FOR NEXT SESSION:    Progress balance and therex - add to HEP as appropriate.    Debara Pickett, SPT  This entire session was performed under direct supervision and direction of a licensed Estate agent . I have personally read, edited and approve of the note as written.   Lenda Kelp, PT 04/27/2023, 1:38 PM

## 2023-04-27 NOTE — Progress Notes (Signed)
[name] MD, MRCP(U.K) [address]  [address]  [place], [place] [address]  Main: [phone]  Fax: [phone]   Primary Care Physician: Lauro Regulus, MD  Primary Gastroenterologist:  Dr. [name]   Chief Complaint  Patient presents with   Dysphagia    HPI: Carlos Mathis is a 82 y.o. male   Summary of history :  Initially referred and seen on 01/27/2023 for dysphagia . History of choking on saliva   09/29/2022 modified barium swallow history of Parkinson's disease normal swallow function no laryngeal penetration or tracheal aspiration.  History of cognitive decline over 2022 exposed to agent orange in [place].   He states that he has been having difficulty swallowing for a few years no real progression recently.  He does however think his Parkinson's has progressed slowly over the years.  He states he has difficulty swallowing solids and liquids more for liquids at times and makes him cough gag also affect swallowing his saliva at times.  He does admit to having a dry mouth.  No unintentional weight loss.  No regurgitation.    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  We discussed about his diet he consumes some sweet tea with monk fruit sweetener as well as a lot of aloe gel I explained to him that this can cause the bloating and to try few weeks by avoiding these additives.  He has never started the PPI which we suggested previously  Current Outpatient Medications  Medication Sig Dispense Refill   aspirin 81 MG EC tablet Take 81 mg by mouth daily.     atorvastatin (LIPITOR) 40 MG tablet Take 40 mg by mouth every evening.     colchicine 0.6 MG tablet 1.2mg  x 1 then 0.6mg  1 hour later x 1. Total 1.8mg  dose / attack.     diclofenac (VOLTAREN) 75 MG EC  tablet      donepezil (ARICEPT ODT) 10 MG disintegrating tablet Take 5 mg by mouth at bedtime.     empagliflozin (JARDIANCE) 25 MG TABS tablet Take 12.5 mg by mouth every morning.     gentamicin ointment (GARAMYCIN) 0.1 % SMARTSIG:1 Topical Daily     glipiZIDE (GLUCOTROL) 10 MG tablet      glucose blood ([employer] BLOOD GLUCOSE TEST) test strip USE 1 STRIP FOR TESTING AS DIRECTED EVERY DAY TO TEST BLOOD SUGAR     glucose blood test strip USE 1 STRIP FOR TESTING AS DIRECTED EVERY DAY TO TEST BLOOD SUGAR     hydrochlorothiazide (HYDRODIURIL) 12.5 MG tablet      lidocaine (LIDODERM) 5 % Place 1 patch onto the skin daily. Remove & Discard patch within 12 hours or as directed by MD 30 patch 0   losartan (COZAAR) 25 MG tablet Take 25 mg by mouth every evening.     meclizine (ANTIVERT) 25 MG tablet Take 25 mg by mouth 3 (three) times daily as needed for dizziness.     meloxicam (MOBIC) 7.5 MG tablet Take by mouth.     metFORMIN (GLUMETZA) 500 MG (MOD) 24 hr tablet Take 500 mg by mouth every evening.     metoprolol succinate (TOPROL-XL) 50 MG 24 hr tablet 25 mg every morning.     mirabegron ER (MYRBETRIQ) 25 MG TB24 tablet TAKE ONE TABLET BY  MOUTH EVERY DAY FOR OVERACTIVE BLADDER     mometasone (ELOCON) 0.1 % lotion Apply topically.     Multiple Vitamin (MULTIVITAMIN) capsule Take 1 capsule by mouth daily.     nitroGLYCERIN (NITROSTAT) 0.4 MG SL tablet Place 0.4 mg under the tongue every 5 (five) minutes as needed for chest pain.     Omega-3 Fatty Acids (OMEGA-3 FISH OIL) 1200 MG CAPS Take 1,200 mg by mouth daily.     omeprazole (PRILOSEC) 40 MG capsule Take 1 capsule (40 mg total) by mouth daily. 30 capsule 0   OVER THE COUNTER MEDICATION Take 1 capsule by mouth daily. Nerve Control 911     rivastigmine (EXELON) 1.5 MG capsule Take 1.5 mg by mouth 2 (two) times daily.     tamsulosin (FLOMAX) 0.4 MG CAPS capsule Take 0.4 mg by mouth every evening.     triamcinolone cream (KENALOG) 0.1 % APPLY MODERATE  AMOUNT TOPICALLY TWO TIMES A DAY AS NEEDED TO FORESKIN. REPLACES BETAMETHASONE     No current facility-administered medications for this visit.    Allergies as of 04/27/2023 - Review Complete 04/27/2023  Allergen Reaction Noted   Meperidine hcl Nausea Only 08/14/2008    ROS:  General: Negative for anorexia, weight loss, fever, chills, fatigue, weakness. ENT: Negative for hoarseness, difficulty swallowing , nasal congestion. CV: Negative for chest pain, angina, palpitations, dyspnea on exertion, peripheral edema.  Respiratory: Negative for dyspnea at rest, dyspnea on exertion, cough, sputum, wheezing.  GI: See history of present illness. GU:  Negative for dysuria, hematuria, urinary incontinence, urinary frequency, nocturnal urination.  Endo: Negative for unusual weight change.    Physical Examination:   BP 132/74   Pulse 90   Temp 98.1 F (36.7 C) (Oral)   Wt 191 lb 6.4 oz (86.8 kg)   BMI 27.46 kg/m   General: Well-nourished, well-developed in no acute distress.  Eyes: No icterus. Conjunctivae pink. Mouth: Oropharyngeal mucosa moist and pink , no lesions erythema or exudate. Neuro: Alert and oriented x 3.  Grossly intact. Skin: Warm and dry, no jaundice.   Psych: Alert and cooperative, normal mood and affect.   Imaging Studies: No results found.  Assessment and Plan:   Carlos Mathis is a 82 y.o. y/o male with a history of Parkinson's disease referred for difficulty swallowing liquids more than solids.  Modified barium swallow does not show any oropharyngeal transfer dysphagia.  He does mention that he suffers from a dry mouth.  The differentials at this point of time would be dysphagia related to dry mouth or esophageal dysphagia.  EGD showed no abnormality and eosinophilic esophagitis was ruled out.   Presently has no issues with dysphagia.  Bloating or regurgitation very likely due to certain food groups that he consumes such as monk fruit extract and aloe gel advised him  to stop it.  If he has further dysphagia in the future come back to see me and we could consider a barium swallow a tablet and possibly esophageal manometry.       Dr [name]  MD,MRCP [facility]) Follow up in as needed

## 2023-05-02 ENCOUNTER — Ambulatory Visit: Payer: Medicare Other

## 2023-05-02 DIAGNOSIS — R278 Other lack of coordination: Secondary | ICD-10-CM

## 2023-05-02 DIAGNOSIS — R262 Difficulty in walking, not elsewhere classified: Secondary | ICD-10-CM

## 2023-05-02 DIAGNOSIS — M6281 Muscle weakness (generalized): Secondary | ICD-10-CM

## 2023-05-02 DIAGNOSIS — R2689 Other abnormalities of gait and mobility: Secondary | ICD-10-CM

## 2023-05-02 DIAGNOSIS — G20B1 Parkinson's disease with dyskinesia, without mention of fluctuations: Secondary | ICD-10-CM

## 2023-05-02 DIAGNOSIS — G20B2 Parkinson's disease with dyskinesia, with fluctuations: Secondary | ICD-10-CM

## 2023-05-02 DIAGNOSIS — R2681 Unsteadiness on feet: Secondary | ICD-10-CM

## 2023-05-02 DIAGNOSIS — R269 Unspecified abnormalities of gait and mobility: Secondary | ICD-10-CM | POA: Diagnosis not present

## 2023-05-02 NOTE — Therapy (Signed)
OUTPATIENT PHYSICAL THERAPY NEURO TREATMENT  Patient Name: Carlos Mathis MRN: [id number] DOB:[birth year], 82 y.o., male Today's Date: 05/02/2023   PCP: Dr. Einar Crow REFERRING PROVIDER: Dr. Cristopher Peru  END OF SESSION:  PT End of Session - 05/02/23 1135     Visit Number 16    Number of Visits 24    Date for PT Re-Evaluation 05/17/23    Authorization Type PT SCREEN    Progress Note Due on Visit 20    PT Start Time 1145    PT Stop Time 1226    PT Time Calculation (min) 41 min    Equipment Utilized During Treatment Gait belt    Activity Tolerance Patient tolerated treatment well    Behavior During Therapy WFL for tasks assessed/performed                  Past Medical History:  Diagnosis Date   Arthritis    BPH (benign prostatic hyperplasia)    CKD (chronic kidney disease), stage III (HCC)    Coronary artery disease    GERD (gastroesophageal reflux disease)    History of hiatal hernia    HLD (hyperlipidemia)    Hypertension    Long term current use of antithrombotics/antiplatelets    a.) DAPT therapy (ASA+ ticagrelor)   MCI (mild cognitive impairment) with memory loss    a.) takes apoaequorin   Migraines    OSA on CPAP    Pneumonia    Skin cancer, basal cell    STEMI (ST elevation myocardial infarction) (HCC) 07/27/2020   a.) MI while living in [place]; underwent PCI placing 4.0 x 18 mm and 3.5 x 38 mm Resolute Onyx DES (vessels unknown/unspecified).   T2DM (type 2 diabetes mellitus) (HCC)    Vertigo    Wears dentures    Full upper, partial lower   Wears hearing aid in both ears    Past Surgical History:  Procedure Laterality Date   CARDIAC CATHETERIZATION     CATARACT EXTRACTION Right    CATARACT EXTRACTION W/PHACO Left 05/03/2022   Procedure: CATARACT EXTRACTION PHACO AND INTRAOCULAR LENS PLACEMENT (IOC) LEFT DIABETIC;  Surgeon: Galen Manila, MD;  Location: [facility];  Service: Ophthalmology;  Laterality: Left;  14.77 1:24.0    ESOPHAGOGASTRODUODENOSCOPY (EGD) WITH PROPOFOL N/A 03/21/2023   Procedure: ESOPHAGOGASTRODUODENOSCOPY (EGD) WITH PROPOFOL;  Surgeon: [name], MD;  Location: [facility];  Service: Gastroenterology;  Laterality: N/A;   HERNIA REPAIR Left    inguinal   INSERTION OF MESH  09/01/2021   Procedure: INSERTION OF MESH;  Surgeon: Carolan Shiver, MD;  Location: [facility];  Service: General;;   JOINT REPLACEMENT     tka   UMBILICAL HERNIA REPAIR     Patient Active Problem List   Diagnosis Date Noted   Acute myocardial infarction, unspecified (HCC) 11/16/2022   Anxiety state 11/16/2022   Balanitis 11/16/2022   Benign paroxysmal positional vertigo 11/16/2022   Cardiac dysrhythmia 11/16/2022   Chronic ischemic heart disease 11/16/2022   Chronic sinusitis 11/16/2022   Gastroesophageal reflux disease 11/16/2022   Headache 11/16/2022   Counseling, unspecified 11/16/2022   History of occlusive disease of artery of lower extremity 11/16/2022   Hypertensive chronic kidney disease with stage 1 through stage 4 chronic kidney disease, or unspecified chronic kidney disease 11/16/2022   Irritable bowel syndrome 11/16/2022   Unspecified dementia, mild, without behavioral disturbance, psychotic disturbance, mood disturbance, and anxiety (HCC) 11/16/2022   Obstructive sleep apnea (adult) (pediatric) 11/16/2022   Osteoarthritis of knee 11/16/2022  Personal history of tuberculosis 11/16/2022   Phimosis 11/16/2022   Restless legs 11/16/2022   Sensorineural hearing loss, bilateral 11/16/2022   Type 2 diabetes mellitus without complications (HCC) 11/16/2022   Diabetic polyneuropathy associated with type 2 diabetes mellitus (HCC) 06/24/2021   Healthcare maintenance 06/24/2021   CAD (coronary artery disease) 09/02/2020   Pure hypercholesterolemia 09/02/2020   Polycystic kidney 02/11/2020   Benign prostatic hyperplasia 02/11/2020   Essential hypertension 02/11/2020   Acquired cyst of kidney  07/09/2019   Spinal stenosis of lumbar region 05/30/2019   Cervical spondylosis 03/12/2019   Dysphagia 03/26/2015   Hypermetropia 02/04/2015   Presbyopia 02/04/2015   Senile nuclear cataract 02/04/2015   Epistaxis 09/23/2014   Laryngopharyngeal reflux (LPR) 09/23/2014    ONSET DATE: worse since Jan 2024  REFERRING DIAG:  Diagnosis  G20.A1 (ICD-10-CM) - Parkinson's disease    THERAPY DIAG:  Abnormality of gait and mobility  Unsteadiness on feet  Difficulty in walking, not elsewhere classified  Other lack of coordination  Parkinson's disease with dyskinesia, unspecified whether manifestations fluctuate (HCC)  Muscle weakness (generalized)  Parkinson's disease with dyskinesia and fluctuating manifestations (HCC)  Other abnormalities of gait and mobility  Rationale for Evaluation and Treatment: Rehabilitation  SUBJECTIVE:                                                                                                                                                                                             SUBJECTIVE STATEMENT: Pt reported a headache, but 0/10 on NPS otherwise. Pt reported no changes since last visit.    Pt accompanied by: self  PERTINENT HISTORY:   Per neurology note submitted in chart by Janice Coffin, PA.  1. 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 Memory loss starting 2018 with cognitive decline over 2022. Patient reports difficulty remembering conversations, difficulty recognizing old friends, getting lost while driving. Per spouse patient can become aggressive behind the wheel. Patient reports visual hallucinations (dark areas out of his periphery) and audio hallucinations. Wife assists with medications and finances. Has become more emotional over the last year and  sometimes becomes angry with spouse which is unusual. Denies alcohol use or difficulty sleeping. Per wife significant procrastiantion, low motivation, more hunched forward posture, balance issues, and gets going walking and has a difficult time stopping.   Patient was cleared to drive per driving assessment, however, we discussed that memory may not keep up with the physical ability to drive if condition continues to get worse   Ambulatory  Referral to Physical Therapy [facility])   2. Peripheral neuropathy- decreased sensation with some pain in the feet in a patient with history of agent orange exposure.  3. Orthostatic Lightheadedness/Vertigo, chronic episodic vertigo  Orthostatic hypotension (neurogenic) can be seen in many conditions such as parkinsonism, diabetic neuropathy, pure autonomic failure etc. Patient should have work up to rule out other causes first such as dehydration, cardiac issues, iatrogenic due to meds etc. Lightheadedness, fainting, fatigue, blurry vision, weakness etc can happen as symptoms. Patient should drink plenty of water, wear compression stockings, exercise, liberal salts, elevate the head end of the bed etc. Fludrocortisone, Midodrine, droxidopa etc. Can be tried in certain patients.   PAIN:  Are you having pain? No  PRECAUTIONS: Fall  RED FLAGS: None   WEIGHT BEARING RESTRICTIONS: No  FALLS: Has patient fallen in last 6 months? No  LIVING ENVIRONMENT: Lives with: Wife Lives in: House/apartment Stairs: Yes: External: 5 steps; can reach both Has following equipment at home: Single point cane and Walker - 2 wheeled  PLOF: Independent  PATIENT GOALS: I want to be stronger and more steady  OBJECTIVE:   DIAGNOSTIC FINDINGS: CLINICAL DATA:  Provided history: Mild cognitive impairment with memory loss.   EXAM: MRI HEAD WITHOUT CONTRAST   TECHNIQUE: Multiplanar, multiecho pulse sequences of the brain and surrounding structures were obtained  without intravenous contrast.   COMPARISON:  Head CT 07/31/2021.   FINDINGS: Brain:   Mild-to-moderate generalized cerebral atrophy. Commensurate prominence of the ventricles and sulci. Comparatively mild cerebellar atrophy.   Multifocal T2 FLAIR hyperintense signal abnormality within the cerebral white matter and pons, nonspecific but compatible with moderate chronic small vessel ischemic disease.   Mild chronic small vessel ischemic changes within the thalami.   There is no acute infarct.   No evidence of an intracranial mass.   No chronic intracranial blood products.   No extra-axial fluid collection.   No midline shift.   Vascular: Maintained flow voids within the proximal large arterial vessels.   Skull and upper cervical spine: No focal suspicious marrow lesion. Degenerative changes and ligamentous hypertrophy at the C1-C2 articulation.   Sinuses/Orbits: No mass or acute finding within the imaged orbits. Prior right ocular lens replacement. Mild mucosal thickening within the bilateral ethmoid and right sphenoid sinuses.   Other: Small-volume fluid within the right mastoid air cells.   IMPRESSION: 1. No evidence of acute intracranial abnormality. 2. Moderate chronic small vessel ischemic changes within the cerebral white matter and pons. 3. Mild chronic small-vessel ischemic changes within the thalami. 4. Mild-to-moderate generalized cerebral atrophy. Comparatively mild cerebellar atrophy. 5. Mild mucosal thickening within the bilateral ethmoid and right sphenoid sinuses. 6. Small-volume fluid within the right mastoid air cells.     Electronically Signed   By: Jackey Loge D.O.   On: 11/11/2021 17:53    COGNITION: Overall cognitive status: Impaired   SENSATION: Light touch: Impaired   COORDINATION: Some difficulty sequencing   POSTURE: forward head   LOWER EXTREMITY MMT:    MMT Right Eval Left Eval  Hip flexion 3+ 4  Hip extension 4 4   Hip abduction 4 4  Hip adduction 4 4  Hip internal rotation 4 4  Hip external rotation 4 4  Knee flexion 4 4  Knee extension 4 4  Ankle dorsiflexion    Ankle plantarflexion    Ankle inversion    Ankle eversion    (Blank rows = not tested)    TRANSFERS: Assistive device utilized:  None  Sit to stand: Complete Independence Stand to sit: Complete Independence Chair to chair: Complete Independence Floor:  Not tested  GAIT: Gait pattern: step through pattern, decreased arm swing- Right, decreased arm swing- Left, decreased step length- Right, and decreased step length- Left Distance walked: 100+ feet Assistive device utilized: None Level of assistance: SBA Comments: Shuffling gait  FUNCTIONAL TESTS:  5 times sit to stand: 18.48 sec without UE Support  Timed up and go (TUG): 16.42 sec without UE support 6 minute walk test: To be tested 2nd visit 10 meter walk test: 14.84 and 14.62 sec  = 0.69 m/s avg without UE support Berg Balance Scale: 43/56  PATIENT SURVEYS:  FOTO 49 with goal of 49  TODAY'S TREATMENT:                                                                                                                              DATE: 05/02/23     Therex:   Octane recumbent stepper- UE/LE  at level 3 x 6 min total- to  promote LE strength/cardioresp endurance. PT sets up intervention, adjusts intensity throughout and monitors pt for response. Cuing for SPM/speed  Standing Hip ABD 6# 2 sets of 12 reps Standing Hip EXT 6# 2 sets of 12 reps  Postural strengthening:  -Horizontal sh abd BTB  2x 12 reps -Scap row with BTB 2 sets of 12 reps  - Shoulder ext with BTB 2 sets of 12 reps  Resistive gait: Matrix cable system 15 feet x 5 reps 12.5# Backwards- Focusing on increased step length and maintaining erect posture  NMR:   Forward gait while punching punch mitts 2 rounds 10 meters 2 rounds 10 meters with cognitive task involving VC's of which mitt to punch while  walking    PATIENT EDUCATION: Education details: Purpose of PT and plan of care Person educated: Patient Education method: Explanation Education comprehension: verbalized understanding  HOME EXERCISE PROGRAM:   GOALS: Goals reviewed with patient? Yes  SHORT TERM GOALS: Target date: 04/05/2023  Pt will be independent with HEP in order to improve strength and balance in order to decrease fall risk and improve function at home and work.   Baseline: EVAL- No formal HEP In place; 04/11/2023- patient reports walking daily and attempting not to shuffle and going to [place] steady. Goal status: ONGOING   LONG TERM GOALS: Target date: 05/17/2023  1.  Patient (> 86 years old) will complete five times sit to stand test in < 15 seconds indicating an increased LE strength and improved balance. Baseline: EVAL= 18.48 sec without UE support; 04/11/2023= 14.94 sec without UE support Goal status: MET- will keep goal active for now to ensure consistency  2.  Patient will increase FOTO score to equal to or greater than  59   to demonstrate statistically significant improvement in mobility and quality of life.  Baseline: EVAL=49; 04/11/2023= 59 Goal status: PROGRESSING/REVISED   3.  Patient will increase  Berg Balance score by > 6 points to demonstrate decreased fall risk during functional activities. Baseline: EVAL=43/56 Goal status: INITIAL   4.  Patient will reduce timed up and go to <11 seconds to reduce fall risk and demonstrate improved transfer/gait ability. Baseline: EVAL=16.42; 04/11/2023= 10 sec without UE support Goal status: MET  5.  Patient will increase 10 meter walk test to >1.28m/s as to improve gait speed for better community ambulation and to reduce fall risk. Baseline: EVAL= 0.69; 04/11/2023= 1.0 m/s without AD.  Goal status: PROGRESSING  6. Patient will increase six minute walk test distance to >1000 for progression to community ambulator and improve gait  ability Baseline:03/08/23: 575 ft with progressive shuffling and flexion posture throughout. 04/11/2023= 925 feet with some festinating gait- worse with increased distance.  Goal status: PROGRESSING   ASSESSMENT:  CLINICAL IMPRESSION: Pt responded well to treatment today. Pt tolerated all tasks during today's visit. Pt requested to take a BTB home with him today, so that he can work on postural strengthening exercises at home. This shows the pt's high motivation to continue to improve in overall strength. Pt will continue to benefit from skilled physical therapy intervention to address impairments, improve QOL, and attain therapy goals.    OBJECTIVE IMPAIRMENTS: Abnormal gait, decreased activity tolerance, decreased balance, decreased cognition, decreased coordination, decreased endurance, decreased mobility, difficulty walking, decreased ROM, decreased strength, hypomobility, and postural dysfunction.   ACTIVITY LIMITATIONS: carrying, lifting, bending, standing, squatting, and stairs  PARTICIPATION LIMITATIONS: cleaning, shopping, community activity, and yard work  PERSONAL FACTORS: Age and 3+ comorbidities: HTN, Vertigo, arthritis  are also affecting patient's functional outcome.   REHAB POTENTIAL: Good  CLINICAL DECISION MAKING: Stable/uncomplicated  EVALUATION COMPLEXITY: Moderate  PLAN:  PT FREQUENCY: 1-2x/week  PT DURATION: 12 weeks  PLANNED INTERVENTIONS: Therapeutic exercises, Therapeutic activity, Neuromuscular re-education, Balance training, Gait training, Patient/Family education, Self Care, Joint mobilization, Joint manipulation, Stair training, Vestibular training, Canalith repositioning, Orthotic/Fit training, DME instructions, Dry Needling, Spinal manipulation, Spinal mobilization, Cryotherapy, Moist heat, Taping, Manual therapy, and Re-evaluation  PLAN FOR NEXT SESSION:    Progress balance and therex - add to HEP as appropriate.    Debara Pickett, SPT  This entire  session was performed under direct supervision and direction of a licensed Estate agent . I have personally read, edited and approve of the note as written.   Louis Meckel, PT  05/02/2023, 12:59 PM

## 2023-05-04 ENCOUNTER — Ambulatory Visit: Payer: Medicare Other

## 2023-05-04 DIAGNOSIS — R2689 Other abnormalities of gait and mobility: Secondary | ICD-10-CM

## 2023-05-04 DIAGNOSIS — G20B1 Parkinson's disease with dyskinesia, without mention of fluctuations: Secondary | ICD-10-CM

## 2023-05-04 DIAGNOSIS — M6281 Muscle weakness (generalized): Secondary | ICD-10-CM

## 2023-05-04 DIAGNOSIS — R269 Unspecified abnormalities of gait and mobility: Secondary | ICD-10-CM | POA: Diagnosis not present

## 2023-05-04 DIAGNOSIS — R262 Difficulty in walking, not elsewhere classified: Secondary | ICD-10-CM

## 2023-05-04 DIAGNOSIS — G20B2 Parkinson's disease with dyskinesia, with fluctuations: Secondary | ICD-10-CM

## 2023-05-04 DIAGNOSIS — R2681 Unsteadiness on feet: Secondary | ICD-10-CM

## 2023-05-04 DIAGNOSIS — R278 Other lack of coordination: Secondary | ICD-10-CM

## 2023-05-04 NOTE — Therapy (Signed)
OUTPATIENT PHYSICAL THERAPY NEURO TREATMENT  Patient Name: Carlos Mathis MRN: [id number] DOB:1940/08/23, 82 y.o., male Today's Date: 05/04/2023   PCP: Dr. Einar Crow REFERRING PROVIDER: Dr. Cristopher Peru  END OF SESSION:  PT End of Session - 05/04/23 1314     Visit Number 17    Number of Visits 24    Date for PT Re-Evaluation 05/17/23    Authorization Type PT SCREEN    Progress Note Due on Visit 20    PT Start Time 1316    PT Stop Time 1358    PT Time Calculation (min) 42 min    Equipment Utilized During Treatment Gait belt    Activity Tolerance Patient tolerated treatment well    Behavior During Therapy WFL for tasks assessed/performed                  Past Medical History:  Diagnosis Date   Arthritis    BPH (benign prostatic hyperplasia)    CKD (chronic kidney disease), stage III (HCC)    Coronary artery disease    GERD (gastroesophageal reflux disease)    History of hiatal hernia    HLD (hyperlipidemia)    Hypertension    Long term current use of antithrombotics/antiplatelets    a.) DAPT therapy (ASA+ ticagrelor)   MCI (mild cognitive impairment) with memory loss    a.) takes apoaequorin   Migraines    OSA on CPAP    Pneumonia    Skin cancer, basal cell    STEMI (ST elevation myocardial infarction) (HCC) 07/27/2020   a.) MI while living in [place]; underwent PCI placing 4.0 x 18 mm and 3.5 x 38 mm Resolute Onyx DES (vessels unknown/unspecified).   T2DM (type 2 diabetes mellitus) (HCC)    Vertigo    Wears dentures    Full upper, partial lower   Wears hearing aid in both ears    Past Surgical History:  Procedure Laterality Date   CARDIAC CATHETERIZATION     CATARACT EXTRACTION Right    CATARACT EXTRACTION W/PHACO Left 05/03/2022   Procedure: CATARACT EXTRACTION PHACO AND INTRAOCULAR LENS PLACEMENT (IOC) LEFT DIABETIC;  Surgeon: Galen Manila, MD;  Location: [facility];  Service: Ophthalmology;  Laterality: Left;  14.77 1:24.0    ESOPHAGOGASTRODUODENOSCOPY (EGD) WITH PROPOFOL N/A 03/21/2023   Procedure: ESOPHAGOGASTRODUODENOSCOPY (EGD) WITH PROPOFOL;  Surgeon: [name], MD;  Location: [facility];  Service: Gastroenterology;  Laterality: N/A;   HERNIA REPAIR Left    inguinal   INSERTION OF MESH  09/01/2021   Procedure: INSERTION OF MESH;  Surgeon: Carolan Shiver, MD;  Location: [facility] ORS;  Service: General;;   JOINT REPLACEMENT     tka   UMBILICAL HERNIA REPAIR     Patient Active Problem List   Diagnosis Date Noted   Acute myocardial infarction, unspecified (HCC) 11/16/2022   Anxiety state 11/16/2022   Balanitis 11/16/2022   Benign paroxysmal positional vertigo 11/16/2022   Cardiac dysrhythmia 11/16/2022   Chronic ischemic heart disease 11/16/2022   Chronic sinusitis 11/16/2022   Gastroesophageal reflux disease 11/16/2022   Headache 11/16/2022   Counseling, unspecified 11/16/2022   History of occlusive disease of artery of lower extremity 11/16/2022   Hypertensive chronic kidney disease with stage 1 through stage 4 chronic kidney disease, or unspecified chronic kidney disease 11/16/2022   Irritable bowel syndrome 11/16/2022   Unspecified dementia, mild, without behavioral disturbance, psychotic disturbance, mood disturbance, and anxiety (HCC) 11/16/2022   Obstructive sleep apnea (adult) (pediatric) 11/16/2022   Osteoarthritis of knee 11/16/2022  Personal history of tuberculosis 11/16/2022   Phimosis 11/16/2022   Restless legs 11/16/2022   Sensorineural hearing loss, bilateral 11/16/2022   Type 2 diabetes mellitus without complications (HCC) 11/16/2022   Diabetic polyneuropathy associated with type 2 diabetes mellitus (HCC) 06/24/2021   Healthcare maintenance 06/24/2021   CAD (coronary artery disease) 09/02/2020   Pure hypercholesterolemia 09/02/2020   Polycystic kidney 02/11/2020   Benign prostatic hyperplasia 02/11/2020   Essential hypertension 02/11/2020   Acquired cyst of kidney  07/09/2019   Spinal stenosis of lumbar region 05/30/2019   Cervical spondylosis 03/12/2019   Dysphagia 03/26/2015   Hypermetropia 02/04/2015   Presbyopia 02/04/2015   Senile nuclear cataract 02/04/2015   Epistaxis 09/23/2014   Laryngopharyngeal reflux (LPR) 09/23/2014    ONSET DATE: worse since Jan 2024  REFERRING DIAG:  Diagnosis  G20.A1 (ICD-10-CM) - Parkinson's disease    THERAPY DIAG:  Abnormality of gait and mobility  Other abnormalities of gait and mobility  Unsteadiness on feet  Difficulty in walking, not elsewhere classified  Other lack of coordination  Parkinson's disease with dyskinesia, unspecified whether manifestations fluctuate (HCC)  Muscle weakness (generalized)  Parkinson's disease with dyskinesia and fluctuating manifestations (HCC)  Rationale for Evaluation and Treatment: Rehabilitation  SUBJECTIVE:                                                                                                                                                                                             SUBJECTIVE STATEMENT: Pt stated that he fell on Tuesday going up the stairs into his house. He felt that he did not get his foot high enough to clear the last step. Pt stated that he bruised his L knee and did not go to a medical provider after this occurred. Pt came in utilizing a single point cane. Pt reported 0/10 on NPS.   Pt accompanied by: self  PERTINENT HISTORY:   Per neurology note submitted in chart by Janice Coffin, PA.  1. 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 Memory loss starting 2018 with cognitive decline over 2022. Patient reports difficulty remembering conversations, difficulty recognizing old friends, getting lost while driving. Per spouse patient can become aggressive  behind the wheel. Patient reports visual hallucinations (dark areas out of his periphery) and audio hallucinations. Wife assists with medications and finances. Has become more emotional over the last year and sometimes becomes angry with spouse which is unusual. Denies alcohol use or difficulty sleeping. Per wife significant procrastiantion, low motivation, more hunched forward posture, balance  issues, and gets going walking and has a difficult time stopping.   Patient was cleared to drive per driving assessment, however, we discussed that memory may not keep up with the physical ability to drive if condition continues to get worse   Ambulatory Referral to Physical Therapy [facility])   2. Peripheral neuropathy- decreased sensation with some pain in the feet in a patient with history of agent orange exposure.  3. Orthostatic Lightheadedness/Vertigo, chronic episodic vertigo  Orthostatic hypotension (neurogenic) can be seen in many conditions such as parkinsonism, diabetic neuropathy, pure autonomic failure etc. Patient should have work up to rule out other causes first such as dehydration, cardiac issues, iatrogenic due to meds etc. Lightheadedness, fainting, fatigue, blurry vision, weakness etc can happen as symptoms. Patient should drink plenty of water, wear compression stockings, exercise, liberal salts, elevate the head end of the bed etc. Fludrocortisone, Midodrine, droxidopa etc. Can be tried in certain patients.   PAIN:  Are you having pain? No  PRECAUTIONS: Fall  RED FLAGS: None   WEIGHT BEARING RESTRICTIONS: No  FALLS: Has patient fallen in last 6 months? No  LIVING ENVIRONMENT: Lives with: Wife Lives in: House/apartment Stairs: Yes: External: 5 steps; can reach both Has following equipment at home: Single point cane and Walker - 2 wheeled  PLOF: Independent  PATIENT GOALS: I want to be stronger and more steady  OBJECTIVE:   DIAGNOSTIC FINDINGS: CLINICAL DATA:   Provided history: Mild cognitive impairment with memory loss.   EXAM: MRI HEAD WITHOUT CONTRAST   TECHNIQUE: Multiplanar, multiecho pulse sequences of the brain and surrounding structures were obtained without intravenous contrast.   COMPARISON:  Head CT 07/31/2021.   FINDINGS: Brain:   Mild-to-moderate generalized cerebral atrophy. Commensurate prominence of the ventricles and sulci. Comparatively mild cerebellar atrophy.   Multifocal T2 FLAIR hyperintense signal abnormality within the cerebral white matter and pons, nonspecific but compatible with moderate chronic small vessel ischemic disease.   Mild chronic small vessel ischemic changes within the thalami.   There is no acute infarct.   No evidence of an intracranial mass.   No chronic intracranial blood products.   No extra-axial fluid collection.   No midline shift.   Vascular: Maintained flow voids within the proximal large arterial vessels.   Skull and upper cervical spine: No focal suspicious marrow lesion. Degenerative changes and ligamentous hypertrophy at the C1-C2 articulation.   Sinuses/Orbits: No mass or acute finding within the imaged orbits. Prior right ocular lens replacement. Mild mucosal thickening within the bilateral ethmoid and right sphenoid sinuses.   Other: Small-volume fluid within the right mastoid air cells.   IMPRESSION: 1. No evidence of acute intracranial abnormality. 2. Moderate chronic small vessel ischemic changes within the cerebral white matter and pons. 3. Mild chronic small-vessel ischemic changes within the thalami. 4. Mild-to-moderate generalized cerebral atrophy. Comparatively mild cerebellar atrophy. 5. Mild mucosal thickening within the bilateral ethmoid and right sphenoid sinuses. 6. Small-volume fluid within the right mastoid air cells.     Electronically Signed   By: Jackey Loge D.O.   On: 11/11/2021 17:53    COGNITION: Overall cognitive status:  Impaired   SENSATION: Light touch: Impaired   COORDINATION: Some difficulty sequencing   POSTURE: forward head   LOWER EXTREMITY MMT:    MMT Right Eval Left Eval  Hip flexion 3+ 4  Hip extension 4 4  Hip abduction 4 4  Hip adduction 4 4  Hip internal rotation 4 4  Hip external rotation  4 4  Knee flexion 4 4  Knee extension 4 4  Ankle dorsiflexion    Ankle plantarflexion    Ankle inversion    Ankle eversion    (Blank rows = not tested)    TRANSFERS: Assistive device utilized: None  Sit to stand: Complete Independence Stand to sit: Complete Independence Chair to chair: Complete Independence Floor:  Not tested  GAIT: Gait pattern: step through pattern, decreased arm swing- Right, decreased arm swing- Left, decreased step length- Right, and decreased step length- Left Distance walked: 100+ feet Assistive device utilized: None Level of assistance: SBA Comments: Shuffling gait  FUNCTIONAL TESTS:  5 times sit to stand: 18.48 sec without UE Support  Timed up and go (TUG): 16.42 sec without UE support 6 minute walk test: To be tested 2nd visit 10 meter walk test: 14.84 and 14.62 sec  = 0.69 m/s avg without UE support Berg Balance Scale: 43/56  PATIENT SURVEYS:  FOTO 49 with goal of 42  TODAY'S TREATMENT:                                                                                                                              DATE: 05/04/23     Therex:   Octane recumbent stepper- UE/LE  at level 2 x 6 min total- to  promote LE strength/cardioresp endurance. PT sets up intervention, adjusts intensity throughout and monitors pt for response. Cuing for SPM/speed  Standing Hip ABD 6# 2 sets of 12 reps Standing Hip EXT 6# 2 sets of 12 reps Hamstring curls GTB 2x12 each LE  Postural strengthening:  -Horizontal sh abd BTB  2x 12 reps -Scap row with BTB 2 sets of 12 reps  - Shoulder ext with BTB 2 sets of 12 reps  Resistive gait: Matrix cable system 15 feet x  5 reps 12.5# Backwards- Focusing on increased step length and maintaining erect posture  NMR:   Forward gait while punching punch mitts 2 rounds 10 meters 3 rounds 10 meters with cognitive task involving VC's of which mitt to punch while walking    PATIENT EDUCATION: Education details: Purpose of PT and plan of care Person educated: Patient Education method: Explanation Education comprehension: verbalized understanding  HOME EXERCISE PROGRAM:   GOALS: Goals reviewed with patient? Yes  SHORT TERM GOALS: Target date: 04/05/2023  Pt will be independent with HEP in order to improve strength and balance in order to decrease fall risk and improve function at home and work.   Baseline: EVAL- No formal HEP In place; 04/11/2023- patient reports walking daily and attempting not to shuffle and going to [place] steady. Goal status: ONGOING   LONG TERM GOALS: Target date: 05/17/2023  1.  Patient (> 48 years old) will complete five times sit to stand test in < 15 seconds indicating an increased LE strength and improved balance. Baseline: EVAL= 18.48 sec without UE support; 04/11/2023= 14.94 sec without UE support Goal status: MET- will keep  goal active for now to ensure consistency  2.  Patient will increase FOTO score to equal to or greater than  59   to demonstrate statistically significant improvement in mobility and quality of life.  Baseline: EVAL=49; 04/11/2023= 59 Goal status: PROGRESSING/REVISED   3.  Patient will increase Berg Balance score by > 6 points to demonstrate decreased fall risk during functional activities. Baseline: EVAL=43/56 Goal status: INITIAL   4.  Patient will reduce timed up and go to <11 seconds to reduce fall risk and demonstrate improved transfer/gait ability. Baseline: EVAL=16.42; 04/11/2023= 10 sec without UE support Goal status: MET  5.  Patient will increase 10 meter walk test to >1.58m/s as to improve gait speed for better community ambulation and to  reduce fall risk. Baseline: EVAL= 0.69; 04/11/2023= 1.0 m/s without AD.  Goal status: PROGRESSING  6. Patient will increase six minute walk test distance to >1000 for progression to community ambulator and improve gait ability Baseline:03/08/23: 575 ft with progressive shuffling and flexion posture throughout. 04/11/2023= 925 feet with some festinating gait- worse with increased distance.  Goal status: PROGRESSING   ASSESSMENT:  CLINICAL IMPRESSION: Pt responded well to treatment today. Pt tolerated all tasks during today's visit. Pt was set at a lower level on the Octane to account for the fall the pt had on Tuesday, but pt reported that knee felt "fine" after completing 6 minutes on the octane. MMT on bilateral knees were conducted and they were both [facility]. Pt was able to complete all tasks during today's session with no increase in L knee pain. Pt will continue to benefit from skilled physical therapy intervention to address impairments, improve QOL, and attain therapy goals.    OBJECTIVE IMPAIRMENTS: Abnormal gait, decreased activity tolerance, decreased balance, decreased cognition, decreased coordination, decreased endurance, decreased mobility, difficulty walking, decreased ROM, decreased strength, hypomobility, and postural dysfunction.   ACTIVITY LIMITATIONS: carrying, lifting, bending, standing, squatting, and stairs  PARTICIPATION LIMITATIONS: cleaning, shopping, community activity, and yard work  PERSONAL FACTORS: Age and 3+ comorbidities: HTN, Vertigo, arthritis  are also affecting patient's functional outcome.   REHAB POTENTIAL: Good  CLINICAL DECISION MAKING: Stable/uncomplicated  EVALUATION COMPLEXITY: Moderate  PLAN:  PT FREQUENCY: 1-2x/week  PT DURATION: 12 weeks  PLANNED INTERVENTIONS: Therapeutic exercises, Therapeutic activity, Neuromuscular re-education, Balance training, Gait training, Patient/Family education, Self Care, Joint mobilization, Joint manipulation,  Stair training, Vestibular training, Canalith repositioning, Orthotic/Fit training, DME instructions, Dry Needling, Spinal manipulation, Spinal mobilization, Cryotherapy, Moist heat, Taping, Manual therapy, and Re-evaluation  PLAN FOR NEXT SESSION:    Progress balance and therex - add to HEP as appropriate.    Debara Pickett, SPT  This entire session was performed under direct supervision and direction of a licensed Estate agent . I have personally read, edited and approve of the note as written.   Louis Meckel, PT  05/04/2023, 3:14 PM

## 2023-05-09 ENCOUNTER — Ambulatory Visit: Payer: Medicare Other

## 2023-05-11 ENCOUNTER — Ambulatory Visit: Payer: Medicare Other

## 2023-05-11 DIAGNOSIS — R2681 Unsteadiness on feet: Secondary | ICD-10-CM

## 2023-05-11 DIAGNOSIS — R269 Unspecified abnormalities of gait and mobility: Secondary | ICD-10-CM

## 2023-05-11 DIAGNOSIS — R278 Other lack of coordination: Secondary | ICD-10-CM

## 2023-05-11 DIAGNOSIS — R262 Difficulty in walking, not elsewhere classified: Secondary | ICD-10-CM

## 2023-05-11 DIAGNOSIS — R2689 Other abnormalities of gait and mobility: Secondary | ICD-10-CM

## 2023-05-11 DIAGNOSIS — M6281 Muscle weakness (generalized): Secondary | ICD-10-CM

## 2023-05-11 NOTE — Therapy (Signed)
OUTPATIENT PHYSICAL THERAPY NEURO TREATMENT  Patient Name: Carlos Mathis MRN: [id number] DOB:1940-07-14, 82 y.o., male Today's Date: 05/11/2023   PCP: Dr. Einar Crow REFERRING PROVIDER: Dr. Cristopher Peru  END OF SESSION:  PT End of Session - 05/11/23 1144     Visit Number 18    Number of Visits 24    Date for PT Re-Evaluation 05/17/23    Authorization Type PT SCREEN    Progress Note Due on Visit 20    PT Start Time 1145    PT Stop Time 1229    PT Time Calculation (min) 44 min    Equipment Utilized During Treatment Gait belt    Activity Tolerance Patient tolerated treatment well    Behavior During Therapy WFL for tasks assessed/performed                  Past Medical History:  Diagnosis Date   Arthritis    BPH (benign prostatic hyperplasia)    CKD (chronic kidney disease), stage III (HCC)    Coronary artery disease    GERD (gastroesophageal reflux disease)    History of hiatal hernia    HLD (hyperlipidemia)    Hypertension    Long term current use of antithrombotics/antiplatelets    a.) DAPT therapy (ASA+ ticagrelor)   MCI (mild cognitive impairment) with memory loss    a.) takes apoaequorin   Migraines    OSA on CPAP    Pneumonia    Skin cancer, basal cell    STEMI (ST elevation myocardial infarction) (HCC) 07/27/2020   a.) MI while living in [place]; underwent PCI placing 4.0 x 18 mm and 3.5 x 38 mm Resolute Onyx DES (vessels unknown/unspecified).   T2DM (type 2 diabetes mellitus) (HCC)    Vertigo    Wears dentures    Full upper, partial lower   Wears hearing aid in both ears    Past Surgical History:  Procedure Laterality Date   CARDIAC CATHETERIZATION     CATARACT EXTRACTION Right    CATARACT EXTRACTION W/PHACO Left 05/03/2022   Procedure: CATARACT EXTRACTION PHACO AND INTRAOCULAR LENS PLACEMENT (IOC) LEFT DIABETIC;  Surgeon: Galen Manila, MD;  Location: [facility];  Service: Ophthalmology;  Laterality: Left;  14.77 1:24.0    ESOPHAGOGASTRODUODENOSCOPY (EGD) WITH PROPOFOL N/A 03/21/2023   Procedure: ESOPHAGOGASTRODUODENOSCOPY (EGD) WITH PROPOFOL;  Surgeon: [name], MD;  Location: [facility];  Service: Gastroenterology;  Laterality: N/A;   HERNIA REPAIR Left    inguinal   INSERTION OF MESH  09/01/2021   Procedure: INSERTION OF MESH;  Surgeon: Carolan Shiver, MD;  Location: [facility];  Service: General;;   JOINT REPLACEMENT     tka   UMBILICAL HERNIA REPAIR     Patient Active Problem List   Diagnosis Date Noted   Acute myocardial infarction, unspecified (HCC) 11/16/2022   Anxiety state 11/16/2022   Balanitis 11/16/2022   Benign paroxysmal positional vertigo 11/16/2022   Cardiac dysrhythmia 11/16/2022   Chronic ischemic heart disease 11/16/2022   Chronic sinusitis 11/16/2022   Gastroesophageal reflux disease 11/16/2022   Headache 11/16/2022   Counseling, unspecified 11/16/2022   History of occlusive disease of artery of lower extremity 11/16/2022   Hypertensive chronic kidney disease with stage 1 through stage 4 chronic kidney disease, or unspecified chronic kidney disease 11/16/2022   Irritable bowel syndrome 11/16/2022   Unspecified dementia, mild, without behavioral disturbance, psychotic disturbance, mood disturbance, and anxiety (HCC) 11/16/2022   Obstructive sleep apnea (adult) (pediatric) 11/16/2022   Osteoarthritis of knee 11/16/2022  Personal history of tuberculosis 11/16/2022   Phimosis 11/16/2022   Restless legs 11/16/2022   Sensorineural hearing loss, bilateral 11/16/2022   Type 2 diabetes mellitus without complications (HCC) 11/16/2022   Diabetic polyneuropathy associated with type 2 diabetes mellitus (HCC) 06/24/2021   Healthcare maintenance 06/24/2021   CAD (coronary artery disease) 09/02/2020   Pure hypercholesterolemia 09/02/2020   Polycystic kidney 02/11/2020   Benign prostatic hyperplasia 02/11/2020   Essential hypertension 02/11/2020   Acquired cyst of kidney  07/09/2019   Spinal stenosis of lumbar region 05/30/2019   Cervical spondylosis 03/12/2019   Dysphagia 03/26/2015   Hypermetropia 02/04/2015   Presbyopia 02/04/2015   Senile nuclear cataract 02/04/2015   Epistaxis 09/23/2014   Laryngopharyngeal reflux (LPR) 09/23/2014    ONSET DATE: worse since Jan 2024  REFERRING DIAG:  Diagnosis  G20.A1 (ICD-10-CM) - Parkinson's disease    THERAPY DIAG:  Abnormality of gait and mobility  Other abnormalities of gait and mobility  Unsteadiness on feet  Difficulty in walking, not elsewhere classified  Other lack of coordination  Muscle weakness (generalized)  Rationale for Evaluation and Treatment: Rehabilitation  SUBJECTIVE:                                                                                                                                                                                             SUBJECTIVE STATEMENT: Pt stated that he fell on Friday last week going up the stairs into his house. He felt that he did not get his foot high enough to clear the last step. Pt stated that he bruised his L knee and did not go to a medical provider after this occurred. Pt came in utilizing a single point cane. Pt reported 3-4/10 on NPS in L knee.   Pt accompanied by: self  PERTINENT HISTORY:   Per neurology note submitted in chart by Janice Coffin, PA.  1. 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 Memory loss starting 2018 with cognitive decline over 2022. Patient reports difficulty remembering conversations, difficulty recognizing old friends, getting lost while driving. Per spouse patient can become aggressive behind the wheel. Patient reports visual hallucinations (dark areas out of his periphery) and audio hallucinations. Wife assists with medications  and finances. Has become more emotional over the last year and sometimes becomes angry with spouse which is unusual. Denies alcohol use or difficulty sleeping. Per wife significant procrastiantion, low motivation, more hunched forward posture, balance issues, and gets going walking and has a difficult time stopping.   Patient  was cleared to drive per driving assessment, however, we discussed that memory may not keep up with the physical ability to drive if condition continues to get worse   Ambulatory Referral to Physical Therapy [facility])   2. Peripheral neuropathy- decreased sensation with some pain in the feet in a patient with history of agent orange exposure.  3. Orthostatic Lightheadedness/Vertigo, chronic episodic vertigo  Orthostatic hypotension (neurogenic) can be seen in many conditions such as parkinsonism, diabetic neuropathy, pure autonomic failure etc. Patient should have work up to rule out other causes first such as dehydration, cardiac issues, iatrogenic due to meds etc. Lightheadedness, fainting, fatigue, blurry vision, weakness etc can happen as symptoms. Patient should drink plenty of water, wear compression stockings, exercise, liberal salts, elevate the head end of the bed etc. Fludrocortisone, Midodrine, droxidopa etc. Can be tried in certain patients.   PAIN:  Are you having pain? No  PRECAUTIONS: Fall  RED FLAGS: None   WEIGHT BEARING RESTRICTIONS: No  FALLS: Has patient fallen in last 6 months? No  LIVING ENVIRONMENT: Lives with: Wife Lives in: House/apartment Stairs: Yes: External: 5 steps; can reach both Has following equipment at home: Single point cane and Walker - 2 wheeled  PLOF: Independent  PATIENT GOALS: I want to be stronger and more steady  OBJECTIVE:   DIAGNOSTIC FINDINGS: CLINICAL DATA:  Provided history: Mild cognitive impairment with memory loss.   EXAM: MRI HEAD WITHOUT CONTRAST   TECHNIQUE: Multiplanar, multiecho pulse  sequences of the brain and surrounding structures were obtained without intravenous contrast.   COMPARISON:  Head CT 07/31/2021.   FINDINGS: Brain:   Mild-to-moderate generalized cerebral atrophy. Commensurate prominence of the ventricles and sulci. Comparatively mild cerebellar atrophy.   Multifocal T2 FLAIR hyperintense signal abnormality within the cerebral white matter and pons, nonspecific but compatible with moderate chronic small vessel ischemic disease.   Mild chronic small vessel ischemic changes within the thalami.   There is no acute infarct.   No evidence of an intracranial mass.   No chronic intracranial blood products.   No extra-axial fluid collection.   No midline shift.   Vascular: Maintained flow voids within the proximal large arterial vessels.   Skull and upper cervical spine: No focal suspicious marrow lesion. Degenerative changes and ligamentous hypertrophy at the C1-C2 articulation.   Sinuses/Orbits: No mass or acute finding within the imaged orbits. Prior right ocular lens replacement. Mild mucosal thickening within the bilateral ethmoid and right sphenoid sinuses.   Other: Small-volume fluid within the right mastoid air cells.   IMPRESSION: 1. No evidence of acute intracranial abnormality. 2. Moderate chronic small vessel ischemic changes within the cerebral white matter and pons. 3. Mild chronic small-vessel ischemic changes within the thalami. 4. Mild-to-moderate generalized cerebral atrophy. Comparatively mild cerebellar atrophy. 5. Mild mucosal thickening within the bilateral ethmoid and right sphenoid sinuses. 6. Small-volume fluid within the right mastoid air cells.     Electronically Signed   By: Jackey Loge D.O.   On: 11/11/2021 17:53    COGNITION: Overall cognitive status: Impaired   SENSATION: Light touch: Impaired   COORDINATION: Some difficulty sequencing   POSTURE: forward head   LOWER EXTREMITY MMT:     MMT Right Eval Left Eval  Hip flexion 3+ 4  Hip extension 4 4  Hip abduction 4 4  Hip adduction 4 4  Hip internal rotation 4 4  Hip external rotation 4 4  Knee flexion 4 4  Knee extension 4 4  Ankle  dorsiflexion    Ankle plantarflexion    Ankle inversion    Ankle eversion    (Blank rows = not tested)    TRANSFERS: Assistive device utilized: None  Sit to stand: Complete Independence Stand to sit: Complete Independence Chair to chair: Complete Independence Floor:  Not tested  GAIT: Gait pattern: step through pattern, decreased arm swing- Right, decreased arm swing- Left, decreased step length- Right, and decreased step length- Left Distance walked: 100+ feet Assistive device utilized: None Level of assistance: SBA Comments: Shuffling gait  FUNCTIONAL TESTS:  5 times sit to stand: 18.48 sec without UE Support  Timed up and go (TUG): 16.42 sec without UE support 6 minute walk test: To be tested 2nd visit 10 meter walk test: 14.84 and 14.62 sec  = 0.69 m/s avg without UE support Berg Balance Scale: 43/56  PATIENT SURVEYS:  FOTO 49 with goal of 69  TODAY'S TREATMENT:                                                                                                                              DATE: 05/11/23     Therex:   Octane recumbent stepper- UE/LE  at level 3 x 6 min total- to  promote LE strength/cardioresp endurance. PT sets up intervention, adjusts intensity throughout and monitors pt for response. Cuing for SPM/speed  Standing Hip ABD 6# 2 sets of 12 reps Standing Hip EXT 6# 2 sets of 12 reps Hamstring curls GTB 2x12 each LE  Postural strengthening:  -Horizontal sh abd BTB  2x 12 reps -Scap row with BTB 2 sets of 12 reps  - Shoulder ext with BTB 2 sets of 12 reps  Resistive gait: Matrix cable system 15 feet x 5 reps 12.5# Backwards- Focusing on increased step length and maintaining erect posture  NMR:   Forward gait while punching punch mitts 2  rounds 10 meters 4 rounds 10 meters with cognitive task involving VC's of which mitt to punch while walking  TA Ascending/Descending 4 steps x 5 reps - VC's involving slowing down and making sure foot is fully on the step before taking the next step    PATIENT EDUCATION: Education details: Purpose of PT and plan of care Person educated: Patient Education method: Explanation Education comprehension: verbalized understanding  HOME EXERCISE PROGRAM:   GOALS: Goals reviewed with patient? Yes  SHORT TERM GOALS: Target date: 04/05/2023  Pt will be independent with HEP in order to improve strength and balance in order to decrease fall risk and improve function at home and work.   Baseline: EVAL- No formal HEP In place; 04/11/2023- patient reports walking daily and attempting not to shuffle and going to [place] steady. Goal status: ONGOING   LONG TERM GOALS: Target date: 05/17/2023  1.  Patient (> 68 years old) will complete five times sit to stand test in < 15 seconds indicating an increased LE strength and improved balance. Baseline: EVAL= 18.48 sec without  UE support; 04/11/2023= 14.94 sec without UE support Goal status: MET- will keep goal active for now to ensure consistency  2.  Patient will increase FOTO score to equal to or greater than  59   to demonstrate statistically significant improvement in mobility and quality of life.  Baseline: EVAL=49; 04/11/2023= 59 Goal status: PROGRESSING/REVISED   3.  Patient will increase Berg Balance score by > 6 points to demonstrate decreased fall risk during functional activities. Baseline: EVAL=43/56 Goal status: INITIAL   4.  Patient will reduce timed up and go to <11 seconds to reduce fall risk and demonstrate improved transfer/gait ability. Baseline: EVAL=16.42; 04/11/2023= 10 sec without UE support Goal status: MET  5.  Patient will increase 10 meter walk test to >1.34m/s as to improve gait speed for better community ambulation and to  reduce fall risk. Baseline: EVAL= 0.69; 04/11/2023= 1.0 m/s without AD.  Goal status: PROGRESSING  6. Patient will increase six minute walk test distance to >1000 for progression to community ambulator and improve gait ability Baseline:03/08/23: 575 ft with progressive shuffling and flexion posture throughout. 04/11/2023= 925 feet with some festinating gait- worse with increased distance.  Goal status: PROGRESSING   ASSESSMENT:  CLINICAL IMPRESSION: Pt responded well to treatment today. Pt tolerated all tasks during today's visit. TA involving ascending/descending stairs was incorporated into today's visit, since pt reported falling going up the stairs for the second time over the past two visits. Pt was instructed to make sure foot was completely on the step before stepping onto the next step and to slowly ascend the stairs to prevent future falls. Pt will continue to benefit from skilled physical therapy intervention to address impairments, improve QOL, and attain therapy goals.    OBJECTIVE IMPAIRMENTS: Abnormal gait, decreased activity tolerance, decreased balance, decreased cognition, decreased coordination, decreased endurance, decreased mobility, difficulty walking, decreased ROM, decreased strength, hypomobility, and postural dysfunction.   ACTIVITY LIMITATIONS: carrying, lifting, bending, standing, squatting, and stairs  PARTICIPATION LIMITATIONS: cleaning, shopping, community activity, and yard work  PERSONAL FACTORS: Age and 3+ comorbidities: HTN, Vertigo, arthritis  are also affecting patient's functional outcome.   REHAB POTENTIAL: Good  CLINICAL DECISION MAKING: Stable/uncomplicated  EVALUATION COMPLEXITY: Moderate  PLAN:  PT FREQUENCY: 1-2x/week  PT DURATION: 12 weeks  PLANNED INTERVENTIONS: Therapeutic exercises, Therapeutic activity, Neuromuscular re-education, Balance training, Gait training, Patient/Family education, Self Care, Joint mobilization, Joint  manipulation, Stair training, Vestibular training, Canalith repositioning, Orthotic/Fit training, DME instructions, Dry Needling, Spinal manipulation, Spinal mobilization, Cryotherapy, Moist heat, Taping, Manual therapy, and Re-evaluation  PLAN FOR NEXT SESSION:    Progress balance and therex - add to HEP as appropriate.    Debara Pickett, SPT  This entire session was performed under direct supervision and direction of a licensed Estate agent . I have personally read, edited and approve of the note as written.   Louis Meckel, PT  05/11/2023, 5:25 PM

## 2023-05-16 ENCOUNTER — Ambulatory Visit: Payer: Medicare Other

## 2023-05-16 DIAGNOSIS — R269 Unspecified abnormalities of gait and mobility: Secondary | ICD-10-CM

## 2023-05-16 DIAGNOSIS — R2681 Unsteadiness on feet: Secondary | ICD-10-CM

## 2023-05-16 DIAGNOSIS — G20B1 Parkinson's disease with dyskinesia, without mention of fluctuations: Secondary | ICD-10-CM

## 2023-05-16 DIAGNOSIS — R2689 Other abnormalities of gait and mobility: Secondary | ICD-10-CM

## 2023-05-16 DIAGNOSIS — R262 Difficulty in walking, not elsewhere classified: Secondary | ICD-10-CM

## 2023-05-16 DIAGNOSIS — R278 Other lack of coordination: Secondary | ICD-10-CM

## 2023-05-16 DIAGNOSIS — M6281 Muscle weakness (generalized): Secondary | ICD-10-CM

## 2023-05-16 DIAGNOSIS — G20B2 Parkinson's disease with dyskinesia, with fluctuations: Secondary | ICD-10-CM

## 2023-05-16 NOTE — Therapy (Signed)
OUTPATIENT PHYSICAL THERAPY NEURO TREATMENT  Patient Name: Carlos Mathis MRN: [id number] DOB:November 24, 1940, 82 y.o., male Today's Date: 05/16/2023   PCP: Dr. Einar Crow REFERRING PROVIDER: Dr. Cristopher Peru  END OF SESSION:  PT End of Session - 05/16/23 1142     Visit Number 19    Number of Visits 24    Date for PT Re-Evaluation 05/17/23    Authorization Type PT SCREEN    Progress Note Due on Visit 20    PT Start Time 1145    PT Stop Time 1228    PT Time Calculation (min) 43 min    Equipment Utilized During Treatment Gait belt    Activity Tolerance Patient tolerated treatment well    Behavior During Therapy WFL for tasks assessed/performed                  Past Medical History:  Diagnosis Date   Arthritis    BPH (benign prostatic hyperplasia)    CKD (chronic kidney disease), stage III (HCC)    Coronary artery disease    GERD (gastroesophageal reflux disease)    History of hiatal hernia    HLD (hyperlipidemia)    Hypertension    Long term current use of antithrombotics/antiplatelets    a.) DAPT therapy (ASA+ ticagrelor)   MCI (mild cognitive impairment) with memory loss    a.) takes apoaequorin   Migraines    OSA on CPAP    Pneumonia    Skin cancer, basal cell    STEMI (ST elevation myocardial infarction) (HCC) 07/27/2020   a.) MI while living in [place]; underwent PCI placing 4.0 x 18 mm and 3.5 x 38 mm Resolute Onyx DES (vessels unknown/unspecified).   T2DM (type 2 diabetes mellitus) (HCC)    Vertigo    Wears dentures    Full upper, partial lower   Wears hearing aid in both ears    Past Surgical History:  Procedure Laterality Date   CARDIAC CATHETERIZATION     CATARACT EXTRACTION Right    CATARACT EXTRACTION W/PHACO Left 05/03/2022   Procedure: CATARACT EXTRACTION PHACO AND INTRAOCULAR LENS PLACEMENT (IOC) LEFT DIABETIC;  Surgeon: Galen Manila, MD;  Location: [facility];  Service: Ophthalmology;  Laterality: Left;  14.77 1:24.0    ESOPHAGOGASTRODUODENOSCOPY (EGD) WITH PROPOFOL N/A 03/21/2023   Procedure: ESOPHAGOGASTRODUODENOSCOPY (EGD) WITH PROPOFOL;  Surgeon: [name], MD;  Location: [facility];  Service: Gastroenterology;  Laterality: N/A;   HERNIA REPAIR Left    inguinal   INSERTION OF MESH  09/01/2021   Procedure: INSERTION OF MESH;  Surgeon: Carolan Shiver, MD;  Location: [facility];  Service: General;;   JOINT REPLACEMENT     tka   UMBILICAL HERNIA REPAIR     Patient Active Problem List   Diagnosis Date Noted   Acute myocardial infarction, unspecified (HCC) 11/16/2022   Anxiety state 11/16/2022   Balanitis 11/16/2022   Benign paroxysmal positional vertigo 11/16/2022   Cardiac dysrhythmia 11/16/2022   Chronic ischemic heart disease 11/16/2022   Chronic sinusitis 11/16/2022   Gastroesophageal reflux disease 11/16/2022   Headache 11/16/2022   Counseling, unspecified 11/16/2022   History of occlusive disease of artery of lower extremity 11/16/2022   Hypertensive chronic kidney disease with stage 1 through stage 4 chronic kidney disease, or unspecified chronic kidney disease 11/16/2022   Irritable bowel syndrome 11/16/2022   Unspecified dementia, mild, without behavioral disturbance, psychotic disturbance, mood disturbance, and anxiety (HCC) 11/16/2022   Obstructive sleep apnea (adult) (pediatric) 11/16/2022   Osteoarthritis of knee 11/16/2022  Personal history of tuberculosis 11/16/2022   Phimosis 11/16/2022   Restless legs 11/16/2022   Sensorineural hearing loss, bilateral 11/16/2022   Type 2 diabetes mellitus without complications (HCC) 11/16/2022   Diabetic polyneuropathy associated with type 2 diabetes mellitus (HCC) 06/24/2021   Healthcare maintenance 06/24/2021   CAD (coronary artery disease) 09/02/2020   Pure hypercholesterolemia 09/02/2020   Polycystic kidney 02/11/2020   Benign prostatic hyperplasia 02/11/2020   Essential hypertension 02/11/2020   Acquired cyst of kidney  07/09/2019   Spinal stenosis of lumbar region 05/30/2019   Cervical spondylosis 03/12/2019   Dysphagia 03/26/2015   Hypermetropia 02/04/2015   Presbyopia 02/04/2015   Senile nuclear cataract 02/04/2015   Epistaxis 09/23/2014   Laryngopharyngeal reflux (LPR) 09/23/2014    ONSET DATE: worse since Jan 2024  REFERRING DIAG:  Diagnosis  G20.A1 (ICD-10-CM) - Parkinson's disease    THERAPY DIAG:  Abnormality of gait and mobility  Unsteadiness on feet  Other abnormalities of gait and mobility  Difficulty in walking, not elsewhere classified  Other lack of coordination  Muscle weakness (generalized)  Parkinson's disease with dyskinesia, unspecified whether manifestations fluctuate (HCC)  Parkinson's disease with dyskinesia and fluctuating manifestations (HCC)  Rationale for Evaluation and Treatment: Rehabilitation  SUBJECTIVE:                                                                                                                                                                                             SUBJECTIVE STATEMENT: Pt reported no falls since last visit, but had to catch himself from falling one time when someone stopped in front of him. Pt came in utilizing a single point cane. Pt reported 3-4/10 on NPS in L knee.   Pt accompanied by: self  PERTINENT HISTORY:   Per neurology note submitted in chart by Janice Coffin, PA.  1. 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 Memory loss starting 2018 with cognitive decline over 2022. Patient reports difficulty remembering conversations, difficulty recognizing old friends, getting lost while driving. Per spouse patient can become aggressive behind the wheel. Patient reports visual hallucinations (dark areas out of his periphery) and audio  hallucinations. Wife assists with medications and finances. Has become more emotional over the last year and sometimes becomes angry with spouse which is unusual. Denies alcohol use or difficulty sleeping. Per wife significant procrastiantion, low motivation, more hunched forward posture, balance issues, and gets going walking and has a difficult time stopping.   Patient was cleared to drive per driving assessment, however, we  discussed that memory may not keep up with the physical ability to drive if condition continues to get worse   Ambulatory Referral to Physical Therapy [facility])   2. Peripheral neuropathy- decreased sensation with some pain in the feet in a patient with history of agent orange exposure.  3. Orthostatic Lightheadedness/Vertigo, chronic episodic vertigo  Orthostatic hypotension (neurogenic) can be seen in many conditions such as parkinsonism, diabetic neuropathy, pure autonomic failure etc. Patient should have work up to rule out other causes first such as dehydration, cardiac issues, iatrogenic due to meds etc. Lightheadedness, fainting, fatigue, blurry vision, weakness etc can happen as symptoms. Patient should drink plenty of water, wear compression stockings, exercise, liberal salts, elevate the head end of the bed etc. Fludrocortisone, Midodrine, droxidopa etc. Can be tried in certain patients.   PAIN:  Are you having pain? No  PRECAUTIONS: Fall  RED FLAGS: None   WEIGHT BEARING RESTRICTIONS: No  FALLS: Has patient fallen in last 6 months? No  LIVING ENVIRONMENT: Lives with: Wife Lives in: House/apartment Stairs: Yes: External: 5 steps; can reach both Has following equipment at home: Single point cane and Walker - 2 wheeled  PLOF: Independent  PATIENT GOALS: I want to be stronger and more steady  OBJECTIVE:   DIAGNOSTIC FINDINGS: CLINICAL DATA:  Provided history: Mild cognitive impairment with memory loss.   EXAM: MRI HEAD WITHOUT CONTRAST    TECHNIQUE: Multiplanar, multiecho pulse sequences of the brain and surrounding structures were obtained without intravenous contrast.   COMPARISON:  Head CT 07/31/2021.   FINDINGS: Brain:   Mild-to-moderate generalized cerebral atrophy. Commensurate prominence of the ventricles and sulci. Comparatively mild cerebellar atrophy.   Multifocal T2 FLAIR hyperintense signal abnormality within the cerebral white matter and pons, nonspecific but compatible with moderate chronic small vessel ischemic disease.   Mild chronic small vessel ischemic changes within the thalami.   There is no acute infarct.   No evidence of an intracranial mass.   No chronic intracranial blood products.   No extra-axial fluid collection.   No midline shift.   Vascular: Maintained flow voids within the proximal large arterial vessels.   Skull and upper cervical spine: No focal suspicious marrow lesion. Degenerative changes and ligamentous hypertrophy at the C1-C2 articulation.   Sinuses/Orbits: No mass or acute finding within the imaged orbits. Prior right ocular lens replacement. Mild mucosal thickening within the bilateral ethmoid and right sphenoid sinuses.   Other: Small-volume fluid within the right mastoid air cells.   IMPRESSION: 1. No evidence of acute intracranial abnormality. 2. Moderate chronic small vessel ischemic changes within the cerebral white matter and pons. 3. Mild chronic small-vessel ischemic changes within the thalami. 4. Mild-to-moderate generalized cerebral atrophy. Comparatively mild cerebellar atrophy. 5. Mild mucosal thickening within the bilateral ethmoid and right sphenoid sinuses. 6. Small-volume fluid within the right mastoid air cells.     Electronically Signed   By: Jackey Loge D.O.   On: 11/11/2021 17:53    COGNITION: Overall cognitive status: Impaired   SENSATION: Light touch: Impaired   COORDINATION: Some difficulty sequencing   POSTURE:  forward head   LOWER EXTREMITY MMT:    MMT Right Eval Left Eval  Hip flexion 3+ 4  Hip extension 4 4  Hip abduction 4 4  Hip adduction 4 4  Hip internal rotation 4 4  Hip external rotation 4 4  Knee flexion 4 4  Knee extension 4 4  Ankle dorsiflexion    Ankle plantarflexion  Ankle inversion    Ankle eversion    (Blank rows = not tested)    TRANSFERS: Assistive device utilized: None  Sit to stand: Complete Independence Stand to sit: Complete Independence Chair to chair: Complete Independence Floor:  Not tested  GAIT: Gait pattern: step through pattern, decreased arm swing- Right, decreased arm swing- Left, decreased step length- Right, and decreased step length- Left Distance walked: 100+ feet Assistive device utilized: None Level of assistance: SBA Comments: Shuffling gait  FUNCTIONAL TESTS:  5 times sit to stand: 18.48 sec without UE Support  Timed up and go (TUG): 16.42 sec without UE support 6 minute walk test: To be tested 2nd visit 10 meter walk test: 14.84 and 14.62 sec  = 0.69 m/s avg without UE support Berg Balance Scale: 43/56  PATIENT SURVEYS:  FOTO 49 with goal of 45  TODAY'S TREATMENT:                                                                                                                              DATE: 05/16/23     Therex:   Octane recumbent stepper- UE/LE  at level 3 x 6 min total- to  promote LE strength/cardioresp endurance. PT sets up intervention, adjusts intensity throughout and monitors pt for response. Cuing for SPM/speed  Standing Hip ABD 6# 2 sets of 12 reps each LE Standing Hip EXT 6# 2 sets of 12 reps each LE Hamstring curls BTB 2x12 each LE  Postural strengthening:  -Horizontal sh abd BTB  2x 12 reps -Scap row with BTB 2 sets of 12 reps  - Shoulder ext with BTB 2 sets of 12 reps  Resistive gait: Matrix cable system 15 feet x 5 reps 12.5# Backwards- Focusing on increased step length and maintaining erect  posture  NMR:   Forward gait while punching punch mitts 2 rounds 10 meters 4 rounds 10 meters with cognitive task involving VC's of which mitt to punch while walking Static Standing on airex pad without UE support 2 reps x 1 minute   PATIENT EDUCATION: Education details: Purpose of PT and plan of care Person educated: Patient Education method: Explanation Education comprehension: verbalized understanding  HOME EXERCISE PROGRAM:   GOALS: Goals reviewed with patient? Yes  SHORT TERM GOALS: Target date: 04/05/2023  Pt will be independent with HEP in order to improve strength and balance in order to decrease fall risk and improve function at home and work.   Baseline: EVAL- No formal HEP In place; 04/11/2023- patient reports walking daily and attempting not to shuffle and going to [place] steady. Goal status: ONGOING   LONG TERM GOALS: Target date: 05/17/2023  1.  Patient (> 36 years old) will complete five times sit to stand test in < 15 seconds indicating an increased LE strength and improved balance. Baseline: EVAL= 18.48 sec without UE support; 04/11/2023= 14.94 sec without UE support Goal status: MET- will keep goal active for now to ensure consistency  2.  Patient will increase FOTO score to equal to or greater than  59   to demonstrate statistically significant improvement in mobility and quality of life.  Baseline: EVAL=49; 04/11/2023= 59 Goal status: PROGRESSING/REVISED   3.  Patient will increase Berg Balance score by > 6 points to demonstrate decreased fall risk during functional activities. Baseline: EVAL=43/56 Goal status: INITIAL   4.  Patient will reduce timed up and go to <11 seconds to reduce fall risk and demonstrate improved transfer/gait ability. Baseline: EVAL=16.42; 04/11/2023= 10 sec without UE support Goal status: MET  5.  Patient will increase 10 meter walk test to >1.33m/s as to improve gait speed for better community ambulation and to reduce fall  risk. Baseline: EVAL= 0.69; 04/11/2023= 1.0 m/s without AD.  Goal status: PROGRESSING  6. Patient will increase six minute walk test distance to >1000 for progression to community ambulator and improve gait ability Baseline:03/08/23: 575 ft with progressive shuffling and flexion posture throughout. 04/11/2023= 925 feet with some festinating gait- worse with increased distance.  Goal status: PROGRESSING   ASSESSMENT:  CLINICAL IMPRESSION: Pt responded well to treatment today. Pt tolerated all tasks during today's visit. Pt was able to progress to completing hamstring curls with BTB and tolerated static standing on airex pad. Pt stated that he has been more cautious with going up stairs ever since that was practiced last visit. Pt will continue to benefit from skilled physical therapy intervention to address impairments, improve QOL, and attain therapy goals.    OBJECTIVE IMPAIRMENTS: Abnormal gait, decreased activity tolerance, decreased balance, decreased cognition, decreased coordination, decreased endurance, decreased mobility, difficulty walking, decreased ROM, decreased strength, hypomobility, and postural dysfunction.   ACTIVITY LIMITATIONS: carrying, lifting, bending, standing, squatting, and stairs  PARTICIPATION LIMITATIONS: cleaning, shopping, community activity, and yard work  PERSONAL FACTORS: Age and 3+ comorbidities: HTN, Vertigo, arthritis  are also affecting patient's functional outcome.   REHAB POTENTIAL: Good  CLINICAL DECISION MAKING: Stable/uncomplicated  EVALUATION COMPLEXITY: Moderate  PLAN:  PT FREQUENCY: 1-2x/week  PT DURATION: 12 weeks  PLANNED INTERVENTIONS: Therapeutic exercises, Therapeutic activity, Neuromuscular re-education, Balance training, Gait training, Patient/Family education, Self Care, Joint mobilization, Joint manipulation, Stair training, Vestibular training, Canalith repositioning, Orthotic/Fit training, DME instructions, Dry Needling, Spinal  manipulation, Spinal mobilization, Cryotherapy, Moist heat, Taping, Manual therapy, and Re-evaluation  PLAN FOR NEXT SESSION:    Progress balance and therex - add to HEP as appropriate.    Debara Pickett, SPT  This entire session was performed under direct supervision and direction of a licensed Estate agent . I have personally read, edited and approve of the note as written.   Louis Meckel, PT  05/16/2023, 1:14 PM

## 2023-05-23 ENCOUNTER — Ambulatory Visit: Payer: Medicare Other

## 2023-05-25 ENCOUNTER — Ambulatory Visit: Payer: Medicare Other

## 2023-05-30 ENCOUNTER — Ambulatory Visit: Payer: Medicare Other

## 2023-06-01 ENCOUNTER — Ambulatory Visit: Payer: Medicare Other

## 2023-06-06 ENCOUNTER — Ambulatory Visit: Payer: Medicare Other

## 2023-06-13 ENCOUNTER — Ambulatory Visit: Payer: Medicare Other

## 2023-06-20 ENCOUNTER — Ambulatory Visit: Payer: Medicare Other

## 2023-06-22 ENCOUNTER — Ambulatory Visit: Payer: Medicare Other | Attending: Neurology

## 2023-06-22 DIAGNOSIS — R269 Unspecified abnormalities of gait and mobility: Secondary | ICD-10-CM | POA: Diagnosis present

## 2023-06-22 DIAGNOSIS — G20B1 Parkinson's disease with dyskinesia, without mention of fluctuations: Secondary | ICD-10-CM | POA: Diagnosis present

## 2023-06-22 DIAGNOSIS — R278 Other lack of coordination: Secondary | ICD-10-CM

## 2023-06-22 DIAGNOSIS — M6281 Muscle weakness (generalized): Secondary | ICD-10-CM

## 2023-06-22 DIAGNOSIS — R2681 Unsteadiness on feet: Secondary | ICD-10-CM

## 2023-06-22 DIAGNOSIS — R2689 Other abnormalities of gait and mobility: Secondary | ICD-10-CM | POA: Diagnosis present

## 2023-06-22 DIAGNOSIS — R262 Difficulty in walking, not elsewhere classified: Secondary | ICD-10-CM

## 2023-06-22 NOTE — Therapy (Signed)
 OUTPATIENT PHYSICAL THERAPY NEURO TREATMENT/RECERT/Physical Therapy Progress Note   Dates of reporting period  04/11/2023   to   06/22/2023   Patient Name: Carlos Mathis MRN: [id number] DOB:01-18-1941, 83 y.o., male Today's Date: 06/22/2023   PCP: Dr. Layman Piety REFERRING PROVIDER: Dr. Jannett Fairly  END OF SESSION:  PT End of Session - 06/22/23 1136     Visit Number 20    Number of Visits 44    Date for PT Re-Evaluation 09/14/23    Progress Note Due on Visit 30    PT Start Time 1137    PT Stop Time 1222    PT Time Calculation (min) 45 min    Equipment Utilized During Treatment Gait belt    Activity Tolerance Patient tolerated treatment well    Behavior During Therapy WFL for tasks assessed/performed                   Past Medical History:  Diagnosis Date   Arthritis    BPH (benign prostatic hyperplasia)    CKD (chronic kidney disease), stage III (HCC)    Coronary artery disease    GERD (gastroesophageal reflux disease)    History of hiatal hernia    HLD (hyperlipidemia)    Hypertension    Long term current use of antithrombotics/antiplatelets    a.) DAPT therapy (ASA+ ticagrelor)   MCI (mild cognitive impairment) with memory loss    a.) takes apoaequorin   Migraines    OSA on CPAP    Pneumonia    Skin cancer, basal cell    STEMI (ST elevation myocardial infarction) (HCC) 07/27/2020   a.) MI while living in [place] ; underwent PCI placing 4.0 x 18 mm and 3.5 x 38 mm Resolute Onyx DES (vessels unknown/unspecified).   T2DM (type 2 diabetes mellitus) (HCC)    Vertigo    Wears dentures    Full upper, partial lower   Wears hearing aid in both ears    Past Surgical History:  Procedure Laterality Date   CARDIAC CATHETERIZATION     CATARACT EXTRACTION Right    CATARACT EXTRACTION W/PHACO Left 05/03/2022   Procedure: CATARACT EXTRACTION PHACO AND INTRAOCULAR LENS PLACEMENT (IOC) LEFT DIABETIC;  Surgeon: Jaye Fallow, MD;  Location: [facility];  Service: Ophthalmology;  Laterality: Left;  14.77 1:24.0   ESOPHAGOGASTRODUODENOSCOPY (EGD) WITH PROPOFOL  N/A 03/21/2023   Procedure: ESOPHAGOGASTRODUODENOSCOPY (EGD) WITH PROPOFOL ;  Surgeon: Therisa Bi, MD;  Location: [facility] END[facility];  Service: Gastroenterology;  Laterality: N/A;   HERNIA REPAIR Left    inguinal   INSERTION OF MESH  09/01/2021   Procedure: INSERTION OF MESH;  Surgeon: Rodolph Romano, MD;  Location: [facility] ORS;  Service: General;;   JOINT REPLACEMENT     tka   UMBILICAL HERNIA REPAIR     Patient Active Problem List   Diagnosis Date Noted   Acute myocardial infarction, unspecified (HCC) 11/16/2022   Anxiety state 11/16/2022   Balanitis 11/16/2022   Benign paroxysmal positional vertigo 11/16/2022   Cardiac dysrhythmia 11/16/2022   Chronic ischemic heart disease 11/16/2022   Chronic sinusitis 11/16/2022   Gastroesophageal reflux disease 11/16/2022   Headache 11/16/2022   Counseling, unspecified 11/16/2022   History of occlusive disease of artery of lower extremity 11/16/2022   Hypertensive chronic kidney disease with stage 1 through stage 4 chronic kidney disease, or unspecified chronic kidney disease 11/16/2022   Irritable bowel syndrome 11/16/2022   Unspecified dementia, mild, without behavioral disturbance, psychotic disturbance, mood disturbance, and anxiety (HCC) 11/16/2022  Obstructive sleep apnea (adult) (pediatric) 11/16/2022   Osteoarthritis of knee 11/16/2022   Personal history of tuberculosis 11/16/2022   Phimosis 11/16/2022   Restless legs 11/16/2022   Sensorineural hearing loss, bilateral 11/16/2022   Type 2 diabetes mellitus without complications (HCC) 11/16/2022   Diabetic polyneuropathy associated with type 2 diabetes mellitus (HCC) 06/24/2021   Healthcare maintenance 06/24/2021   CAD (coronary artery disease) 09/02/2020   Pure hypercholesterolemia 09/02/2020   Polycystic kidney 02/11/2020   Benign prostatic hyperplasia 02/11/2020    Essential hypertension 02/11/2020   Acquired cyst of kidney 07/09/2019   Spinal stenosis of lumbar region 05/30/2019   Cervical spondylosis 03/12/2019   Dysphagia 03/26/2015   Hypermetropia 02/04/2015   Presbyopia 02/04/2015   Senile nuclear cataract 02/04/2015   Epistaxis 09/23/2014   Laryngopharyngeal reflux (LPR) 09/23/2014    ONSET DATE: worse since Jan 2024  REFERRING DIAG:  Diagnosis  G20.A1 (ICD-10-CM) - Parkinson's disease    THERAPY DIAG:  Abnormality of gait and mobility  Unsteadiness on feet  Other abnormalities of gait and mobility  Difficulty in walking, not elsewhere classified  Other lack of coordination  Muscle weakness (generalized)  Parkinson's disease with dyskinesia, unspecified whether manifestations fluctuate (HCC)  Rationale for Evaluation and Treatment: Rehabilitation  SUBJECTIVE:                                                                                                                                                                                             SUBJECTIVE STATEMENT: Patient reports he has not done well since last seen. Reports he took a break due to going on vacation but visit was cut short. States he fell 3 weeks ago and injured his right elbow.    Pt accompanied by: self  PERTINENT HISTORY:   Per neurology note submitted in chart by Kaitlin Paich, PA.  1. 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 Memory loss starting 2018 with cognitive decline over 2022. Patient reports difficulty remembering conversations, difficulty recognizing old friends, getting lost while driving. Per spouse patient can become aggressive behind the wheel. Patient reports visual hallucinations (dark areas out of his periphery) and audio hallucinations. Wife assists with  medications and finances. Has become more emotional over the last year and sometimes becomes angry with spouse which is unusual. Denies alcohol use or difficulty sleeping. Per wife significant procrastiantion, low motivation, more hunched forward posture, balance issues, and gets going walking and has a difficult time stopping.   Patient was cleared to drive per driving  assessment, however, we discussed that memory may not keep up with the physical ability to drive if condition continues to get worse   Ambulatory Referral to Physical Therapy [facility])   2. Peripheral neuropathy- decreased sensation with some pain in the feet in a patient with history of agent orange exposure.  3. Orthostatic Lightheadedness/Vertigo, chronic episodic vertigo  Orthostatic hypotension (neurogenic) can be seen in many conditions such as parkinsonism, diabetic neuropathy, pure autonomic failure etc. Patient should have work up to rule out other causes first such as dehydration, cardiac issues, iatrogenic due to meds etc. Lightheadedness, fainting, fatigue, blurry vision, weakness etc can happen as symptoms. Patient should drink plenty of water, wear compression stockings, exercise, liberal salts, elevate the head end of the bed etc. Fludrocortisone, Midodrine, droxidopa etc. Can be tried in certain patients.   PAIN:  Are you having pain? No  PRECAUTIONS: Fall  RED FLAGS: None   WEIGHT BEARING RESTRICTIONS: No  FALLS: Has patient fallen in last 6 months? No  LIVING ENVIRONMENT: Lives with: Wife Lives in: House/apartment Stairs: Yes: External: 5 steps; can reach both Has following equipment at home: Single point cane and Walker - 2 wheeled  PLOF: Independent  PATIENT GOALS: I want to be stronger and more steady  OBJECTIVE:   DIAGNOSTIC FINDINGS: CLINICAL DATA:  Provided history: Mild cognitive impairment with memory loss.   EXAM: MRI HEAD WITHOUT CONTRAST   TECHNIQUE: Multiplanar, multiecho  pulse sequences of the brain and surrounding structures were obtained without intravenous contrast.   COMPARISON:  Head CT 07/31/2021.   FINDINGS: Brain:   Mild-to-moderate generalized cerebral atrophy. Commensurate prominence of the ventricles and sulci. Comparatively mild cerebellar atrophy.   Multifocal T2 FLAIR hyperintense signal abnormality within the cerebral white matter and pons, nonspecific but compatible with moderate chronic small vessel ischemic disease.   Mild chronic small vessel ischemic changes within the thalami.   There is no acute infarct.   No evidence of an intracranial mass.   No chronic intracranial blood products.   No extra-axial fluid collection.   No midline shift.   Vascular: Maintained flow voids within the proximal large arterial vessels.   Skull and upper cervical spine: No focal suspicious marrow lesion. Degenerative changes and ligamentous hypertrophy at the C1-C2 articulation.   Sinuses/Orbits: No mass or acute finding within the imaged orbits. Prior right ocular lens replacement. Mild mucosal thickening within the bilateral ethmoid and right sphenoid sinuses.   Other: Small-volume fluid within the right mastoid air cells.   IMPRESSION: 1. No evidence of acute intracranial abnormality. 2. Moderate chronic small vessel ischemic changes within the cerebral white matter and pons. 3. Mild chronic small-vessel ischemic changes within the thalami. 4. Mild-to-moderate generalized cerebral atrophy. Comparatively mild cerebellar atrophy. 5. Mild mucosal thickening within the bilateral ethmoid and right sphenoid sinuses. 6. Small-volume fluid within the right mastoid air cells.     Electronically Signed   By: Rockey Childs D.O.   On: 11/11/2021 17:53    COGNITION: Overall cognitive status: Impaired   SENSATION: Light touch: Impaired   COORDINATION: Some difficulty sequencing   POSTURE: forward head   LOWER EXTREMITY MMT:     MMT Right Eval Left Eval  Hip flexion 3+ 4  Hip extension 4 4  Hip abduction 4 4  Hip adduction 4 4  Hip internal rotation 4 4  Hip external rotation 4 4  Knee flexion 4 4  Knee extension 4 4  Ankle dorsiflexion    Ankle plantarflexion  Ankle inversion    Ankle eversion    (Blank rows = not tested)    TRANSFERS: Assistive device utilized: None  Sit to stand: Complete Independence Stand to sit: Complete Independence Chair to chair: Complete Independence Floor:  Not tested  GAIT: Gait pattern: step through pattern, decreased arm swing- Right, decreased arm swing- Left, decreased step length- Right, and decreased step length- Left Distance walked: 100+ feet Assistive device utilized: None Level of assistance: SBA Comments: Shuffling gait  FUNCTIONAL TESTS:  5 times sit to stand: 18.48 sec without UE Support  Timed up and go (TUG): 16.42 sec without UE support 6 minute walk test: To be tested 2nd visit 10 meter walk test: 14.84 and 14.62 sec  = 0.69 m/s avg without UE support Berg Balance Scale: 43/56  PATIENT SURVEYS:  FOTO 49 with goal of 42  TODAY'S TREATMENT:                                                                                                                              DATE: 06/22/23    Physical therapy treatment session today consisted of completing assessment of goals and administration of testing as demonstrated and documented in flow sheet, treatment, and goals section of this note. Addition treatments may be found below.    [facility] PT Assessment - 06/22/23 1201       Berg Balance Test   Sit to Stand Able to stand without using hands and stabilize independently    Standing Unsupported Able to stand safely 2 minutes    Sitting with Back Unsupported but Feet Supported on Floor or Stool Able to sit safely and securely 2 minutes    Stand to Sit Sits safely with minimal use of hands    Transfers Able to transfer safely, minor use of hands     Standing Unsupported with Eyes Closed Able to stand 10 seconds with supervision    Standing Unsupported with Feet Together Able to place feet together independently and stand for 1 minute with supervision    From Standing, Reach Forward with Outstretched Arm Can reach forward >12 cm safely (5)    From Standing Position, Pick up Object from Floor Able to pick up shoe, needs supervision    From Standing Position, Turn to Look Behind Over each Shoulder Turn sideways only but maintains balance    Turn 360 Degrees Able to turn 360 degrees safely but slowly    Standing Unsupported, Alternately Place Feet on Step/Stool Able to stand independently and safely and complete 8 steps in 20 seconds    Standing Unsupported, One Foot in Front Able to plae foot ahead of the other independently and hold 30 seconds    Standing on One Leg Tries to lift leg/unable to hold 3 seconds but remains standing independently    Total Score 44               PATIENT EDUCATION: Education details: Purpose of PT  and plan of care Person educated: Patient Education method: Explanation Education comprehension: verbalized understanding  HOME EXERCISE PROGRAM:   GOALS: Goals reviewed with patient? Yes  SHORT TERM GOALS: Target date: 04/05/2023  Pt will be independent with HEP in order to improve strength and balance in order to decrease fall risk and improve function at home and work.   Baseline: EVAL- No formal HEP In place; 04/11/2023- patient reports walking daily and attempting not to shuffle and going to [place] steady. 06/22/2023- Patient reports inconsistent with HEP due to [facility] and traveling. Goal status: ONGOING   LONG TERM GOALS: Target date: 09/14/2023  1.  Patient (> 40 years old) will complete five times sit to stand test in < 15 seconds indicating an increased LE strength and improved balance. Baseline: EVAL= 18.48 sec without UE support; 04/11/2023= 14.94 sec without UE support. 06/22/2023= 16.54 sec  without UE support Goal status: PROGRESSING  2.  Patient will increase FOTO score to equal to or greater than  59   to demonstrate statistically significant improvement in mobility and quality of life.  Baseline: EVAL=49; 04/11/2023= 59; 06/22/2023=64 Goal status: MET   3.  Patient will increase Berg Balance score by > 6 points to demonstrate decreased fall risk during functional activities. Baseline: EVAL=43/56; 06/22/2023=44/56 Goal status: ONGOING   4.  Patient will reduce timed up and go to <11 seconds to reduce fall risk and demonstrate improved transfer/gait ability. Baseline: EVAL=16.42; 04/11/2023= 10 sec without UE support Goal status: MET  5.  Patient will increase 10 meter walk test to >1.58m/s as to improve gait speed for better community ambulation and to reduce fall risk. Baseline: EVAL= 0.69; 04/11/2023= 1.0 m/s without AD. 06/22/2023=0.82 m/s Goal status: Ongoing  6. Patient will increase six minute walk test distance to >1000 for progression to community ambulator and improve gait ability Baseline:03/08/23: 575 ft with progressive shuffling and flexion posture throughout. 04/11/2023= 925 feet with some festinating gait- worse with increased distance.  Goal status: PROGRESSING  7.   Patient will reduce timed up and go to <10 seconds to reduce fall risk and demonstrate improved transfer/gait ability. Baseline:  06/22/2023= 13.41 sec  Goal status: NEW   ASSESSMENT:  CLINICAL IMPRESSION: Patient returns to PT after < 1 month absence. He was subject to reassessment for recert/progress note so assessed all goals today and even tested some previously met goals as patient self reported he felt like he had regressed including a fall during absence. Test results confirmed that patient has had a recent decline since last progress note. He has several goals in which he regressed from previous progress visit but overall improved from initial evaluation. Patient's condition has the potential  to improve in response to therapy. Maximum improvement is yet to be obtained. The anticipated improvement is attainable and reasonable in a generally predictable time.   Pt will continue to benefit from skilled physical therapy intervention to address impairments, improve QOL, and attain therapy goals.    OBJECTIVE IMPAIRMENTS: Abnormal gait, decreased activity tolerance, decreased balance, decreased cognition, decreased coordination, decreased endurance, decreased mobility, difficulty walking, decreased ROM, decreased strength, hypomobility, and postural dysfunction.   ACTIVITY LIMITATIONS: carrying, lifting, bending, standing, squatting, and stairs  PARTICIPATION LIMITATIONS: cleaning, shopping, community activity, and yard work  PERSONAL FACTORS: Age and 3+ comorbidities: HTN, Vertigo, arthritis  are also affecting patient's functional outcome.   REHAB POTENTIAL: Good  CLINICAL DECISION MAKING: Stable/uncomplicated  EVALUATION COMPLEXITY: Moderate  PLAN:  PT FREQUENCY: 1-2x/week  PT DURATION: 12  weeks  PLANNED INTERVENTIONS: Therapeutic exercises, Therapeutic activity, Neuromuscular re-education, Balance training, Gait training, Patient/Family education, Self Care, Joint mobilization, Joint manipulation, Stair training, Vestibular training, Canalith repositioning, Orthotic/Fit training, DME instructions, Dry Needling, Spinal manipulation, Spinal mobilization, Cryotherapy, Moist heat, Taping, Manual therapy, and Re-evaluation  PLAN FOR NEXT SESSION:    Progress balance and therex - add to HEP as appropriate.     Chyrl London, PT  06/22/2023, 2:14 PM

## 2023-06-26 NOTE — Therapy (Signed)
 OUTPATIENT PHYSICAL THERAPY NEURO TREATMENT    Patient Name: Carlos Mathis MRN: [id number] DOB:04/06/1941, 83 y.o., male Today's Date: 06/27/2023   PCP: Dr. Layman Piety REFERRING PROVIDER: Dr. Jannett Fairly  END OF SESSION:  PT End of Session - 06/27/23 1012     Visit Number 21    Number of Visits 44    Date for PT Re-Evaluation 09/14/23    Progress Note Due on Visit 30    PT Start Time 1015    PT Stop Time 1059    PT Time Calculation (min) 44 min    Equipment Utilized During Treatment Gait belt    Activity Tolerance Patient tolerated treatment well    Behavior During Therapy WFL for tasks assessed/performed                    Past Medical History:  Diagnosis Date   Arthritis    BPH (benign prostatic hyperplasia)    CKD (chronic kidney disease), stage III (HCC)    Coronary artery disease    GERD (gastroesophageal reflux disease)    History of hiatal hernia    HLD (hyperlipidemia)    Hypertension    Long term current use of antithrombotics/antiplatelets    a.) DAPT therapy (ASA+ ticagrelor)   MCI (mild cognitive impairment) with memory loss    a.) takes apoaequorin   Migraines    OSA on CPAP    Pneumonia    Skin cancer, basal cell    STEMI (ST elevation myocardial infarction) (HCC) 07/27/2020   a.) MI while living in [place] ; underwent PCI placing 4.0 x 18 mm and 3.5 x 38 mm Resolute Onyx DES (vessels unknown/unspecified).   T2DM (type 2 diabetes mellitus) (HCC)    Vertigo    Wears dentures    Full upper, partial lower   Wears hearing aid in both ears    Past Surgical History:  Procedure Laterality Date   CARDIAC CATHETERIZATION     CATARACT EXTRACTION Right    CATARACT EXTRACTION W/PHACO Left 05/03/2022   Procedure: CATARACT EXTRACTION PHACO AND INTRAOCULAR LENS PLACEMENT (IOC) LEFT DIABETIC;  Surgeon: Jaye Fallow, MD;  Location: [facility];  Service: Ophthalmology;  Laterality: Left;  14.77 1:24.0   ESOPHAGOGASTRODUODENOSCOPY  (EGD) WITH PROPOFOL  N/A 03/21/2023   Procedure: ESOPHAGOGASTRODUODENOSCOPY (EGD) WITH PROPOFOL ;  Surgeon: Therisa Bi, MD;  Location: [facility];  Service: Gastroenterology;  Laterality: N/A;   HERNIA REPAIR Left    inguinal   INSERTION OF MESH  09/01/2021   Procedure: INSERTION OF MESH;  Surgeon: Rodolph Romano, MD;  Location: [facility];  Service: General;;   JOINT REPLACEMENT     tka   UMBILICAL HERNIA REPAIR     Patient Active Problem List   Diagnosis Date Noted   Acute myocardial infarction, unspecified (HCC) 11/16/2022   Anxiety state 11/16/2022   Balanitis 11/16/2022   Benign paroxysmal positional vertigo 11/16/2022   Cardiac dysrhythmia 11/16/2022   Chronic ischemic heart disease 11/16/2022   Chronic sinusitis 11/16/2022   Gastroesophageal reflux disease 11/16/2022   Headache 11/16/2022   Counseling, unspecified 11/16/2022   History of occlusive disease of artery of lower extremity 11/16/2022   Hypertensive chronic kidney disease with stage 1 through stage 4 chronic kidney disease, or unspecified chronic kidney disease 11/16/2022   Irritable bowel syndrome 11/16/2022   Unspecified dementia, mild, without behavioral disturbance, psychotic disturbance, mood disturbance, and anxiety (HCC) 11/16/2022   Obstructive sleep apnea (adult) (pediatric) 11/16/2022   Osteoarthritis of knee 11/16/2022   Personal  history of tuberculosis 11/16/2022   Phimosis 11/16/2022   Restless legs 11/16/2022   Sensorineural hearing loss, bilateral 11/16/2022   Type 2 diabetes mellitus without complications (HCC) 11/16/2022   Diabetic polyneuropathy associated with type 2 diabetes mellitus (HCC) 06/24/2021   Healthcare maintenance 06/24/2021   CAD (coronary artery disease) 09/02/2020   Pure hypercholesterolemia 09/02/2020   Polycystic kidney 02/11/2020   Benign prostatic hyperplasia 02/11/2020   Essential hypertension 02/11/2020   Acquired cyst of kidney 07/09/2019   Spinal stenosis of  lumbar region 05/30/2019   Cervical spondylosis 03/12/2019   Dysphagia 03/26/2015   Hypermetropia 02/04/2015   Presbyopia 02/04/2015   Senile nuclear cataract 02/04/2015   Epistaxis 09/23/2014   Laryngopharyngeal reflux (LPR) 09/23/2014    ONSET DATE: worse since Jan 2024  REFERRING DIAG:  Diagnosis  G20.A1 (ICD-10-CM) - Parkinson's disease    THERAPY DIAG:  Abnormality of gait and mobility  Unsteadiness on feet  Other abnormalities of gait and mobility  Difficulty in walking, not elsewhere classified  Other lack of coordination  Muscle weakness (generalized)  Parkinson's disease with dyskinesia, unspecified whether manifestations fluctuate (HCC)  Rationale for Evaluation and Treatment: Rehabilitation  SUBJECTIVE:                                                                                                                                                                                             SUBJECTIVE STATEMENT: Patient reports no falls since last visit but states mostly sedentary over the weekend.    Pt accompanied by: self  PERTINENT HISTORY:   Per neurology note submitted in chart by Kaitlin Paich, PA.  1. 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 Memory loss starting 2018 with cognitive decline over 2022. Patient reports difficulty remembering conversations, difficulty recognizing old friends, getting lost while driving. Per spouse patient can become aggressive behind the wheel. Patient reports visual hallucinations (dark areas out of his periphery) and audio hallucinations. Wife assists with medications and finances. Has become more emotional over the last year and sometimes becomes angry with spouse which is unusual. Denies alcohol use or difficulty sleeping. Per wife significant  procrastiantion, low motivation, more hunched forward posture, balance issues, and gets going walking and has a difficult time stopping.   Patient was cleared to drive per driving assessment, however, we discussed that memory may not keep up with the physical ability to drive if condition continues to get worse   Ambulatory Referral to Physical Therapy [facility])   2. Peripheral neuropathy-  decreased sensation with some pain in the feet in a patient with history of agent orange exposure.  3. Orthostatic Lightheadedness/Vertigo, chronic episodic vertigo  Orthostatic hypotension (neurogenic) can be seen in many conditions such as parkinsonism, diabetic neuropathy, pure autonomic failure etc. Patient should have work up to rule out other causes first such as dehydration, cardiac issues, iatrogenic due to meds etc. Lightheadedness, fainting, fatigue, blurry vision, weakness etc can happen as symptoms. Patient should drink plenty of water, wear compression stockings, exercise, liberal salts, elevate the head end of the bed etc. Fludrocortisone, Midodrine, droxidopa etc. Can be tried in certain patients.   PAIN:  Are you having pain? No  PRECAUTIONS: Fall  RED FLAGS: None   WEIGHT BEARING RESTRICTIONS: No  FALLS: Has patient fallen in last 6 months? No  LIVING ENVIRONMENT: Lives with: Wife Lives in: House/apartment Stairs: Yes: External: 5 steps; can reach both Has following equipment at home: Single point cane and Walker - 2 wheeled  PLOF: Independent  PATIENT GOALS: I want to be stronger and more steady  OBJECTIVE:   DIAGNOSTIC FINDINGS: CLINICAL DATA:  Provided history: Mild cognitive impairment with memory loss.   EXAM: MRI HEAD WITHOUT CONTRAST   TECHNIQUE: Multiplanar, multiecho pulse sequences of the brain and surrounding structures were obtained without intravenous contrast.   COMPARISON:  Head CT 07/31/2021.   FINDINGS: Brain:   Mild-to-moderate generalized  cerebral atrophy. Commensurate prominence of the ventricles and sulci. Comparatively mild cerebellar atrophy.   Multifocal T2 FLAIR hyperintense signal abnormality within the cerebral white matter and pons, nonspecific but compatible with moderate chronic small vessel ischemic disease.   Mild chronic small vessel ischemic changes within the thalami.   There is no acute infarct.   No evidence of an intracranial mass.   No chronic intracranial blood products.   No extra-axial fluid collection.   No midline shift.   Vascular: Maintained flow voids within the proximal large arterial vessels.   Skull and upper cervical spine: No focal suspicious marrow lesion. Degenerative changes and ligamentous hypertrophy at the C1-C2 articulation.   Sinuses/Orbits: No mass or acute finding within the imaged orbits. Prior right ocular lens replacement. Mild mucosal thickening within the bilateral ethmoid and right sphenoid sinuses.   Other: Small-volume fluid within the right mastoid air cells.   IMPRESSION: 1. No evidence of acute intracranial abnormality. 2. Moderate chronic small vessel ischemic changes within the cerebral white matter and pons. 3. Mild chronic small-vessel ischemic changes within the thalami. 4. Mild-to-moderate generalized cerebral atrophy. Comparatively mild cerebellar atrophy. 5. Mild mucosal thickening within the bilateral ethmoid and right sphenoid sinuses. 6. Small-volume fluid within the right mastoid air cells.     Electronically Signed   By: Rockey Childs D.O.   On: 11/11/2021 17:53    COGNITION: Overall cognitive status: Impaired   SENSATION: Light touch: Impaired   COORDINATION: Some difficulty sequencing   POSTURE: forward head   LOWER EXTREMITY MMT:    MMT Right Eval Left Eval  Hip flexion 3+ 4  Hip extension 4 4  Hip abduction 4 4  Hip adduction 4 4  Hip internal rotation 4 4  Hip external rotation 4 4  Knee flexion 4 4  Knee  extension 4 4  Ankle dorsiflexion    Ankle plantarflexion    Ankle inversion    Ankle eversion    (Blank rows = not tested)    TRANSFERS: Assistive device utilized: None  Sit to stand: Complete Independence Stand to sit: Complete Independence  Chair to chair: Complete Independence Floor:  Not tested  GAIT: Gait pattern: step through pattern, decreased arm swing- Right, decreased arm swing- Left, decreased step length- Right, and decreased step length- Left Distance walked: 100+ feet Assistive device utilized: None Level of assistance: SBA Comments: Shuffling gait  FUNCTIONAL TESTS:  5 times sit to stand: 18.48 sec without UE Support  Timed up and go (TUG): 16.42 sec without UE support 6 minute walk test: To be tested 2nd visit 10 meter walk test: 14.84 and 14.62 sec  = 0.69 m/s avg without UE support Berg Balance Scale: 43/56  PATIENT SURVEYS:  FOTO 49 with goal of 63  TODAY'S TREATMENT:                                                                                                                              DATE: 06/27/23    NMR:  Step up onto airex pad - 15 reps alt LE Step up onto 6 block- 15 reps alt LE (intermittent stumble with left LE)  Step tap onto 12 block- 20 reps alt LE Dynamic weight shift (hands on hips) - SLS while performing hip abd- hold 1-2 sec  to improve stability of stance LE  FWD ambulation (looking into mirror with VC to heel strike so he can see the sole of shoe)  then Retro gait back (VC for large steps)  Side step and reach x 10  (Focusing on weight shifting)  Treadmill walking- 1.3 mph x 3 min- Constant VC to pick feet up- Very conversive and when talking - increase tendency to   THEREX:  Calf raises on 1/2 foam  roll- 2sets x 12 Toe raises on 1/2 foam roll 2 sets x 12 reps      PATIENT EDUCATION: Education details: Purpose of PT and plan of care Person educated: Patient Education method: Explanation Education comprehension:  verbalized understanding  HOME EXERCISE PROGRAM:   GOALS: Goals reviewed with patient? Yes  SHORT TERM GOALS: Target date: 04/05/2023  Pt will be independent with HEP in order to improve strength and balance in order to decrease fall risk and improve function at home and work.   Baseline: EVAL- No formal HEP In place; 04/11/2023- patient reports walking daily and attempting not to shuffle and going to [place] steady. 06/22/2023- Patient reports inconsistent with HEP due to [facility] and traveling. Goal status: ONGOING   LONG TERM GOALS: Target date: 09/14/2023  1.  Patient (> 32 years old) will complete five times sit to stand test in < 15 seconds indicating an increased LE strength and improved balance. Baseline: EVAL= 18.48 sec without UE support; 04/11/2023= 14.94 sec without UE support. 06/22/2023= 16.54 sec without UE support Goal status: PROGRESSING  2.  Patient will increase FOTO score to equal to or greater than  59   to demonstrate statistically significant improvement in mobility and quality of life.  Baseline: EVAL=49; 04/11/2023= 59; 06/22/2023=64 Goal status: MET  3.  Patient will increase Berg Balance score by > 6 points to demonstrate decreased fall risk during functional activities. Baseline: EVAL=43/56; 06/22/2023=44/56 Goal status: ONGOING   4.  Patient will reduce timed up and go to <11 seconds to reduce fall risk and demonstrate improved transfer/gait ability. Baseline: EVAL=16.42; 04/11/2023= 10 sec without UE support Goal status: MET  5.  Patient will increase 10 meter walk test to >1.26m/s as to improve gait speed for better community ambulation and to reduce fall risk. Baseline: EVAL= 0.69; 04/11/2023= 1.0 m/s without AD. 06/22/2023=0.82 m/s Goal status: Ongoing  6. Patient will increase six minute walk test distance to >1000 for progression to community ambulator and improve gait ability Baseline:03/08/23: 575 ft with progressive shuffling and flexion posture  throughout. 04/11/2023= 925 feet with some festinating gait- worse with increased distance.  Goal status: PROGRESSING  7.   Patient will reduce timed up and go to <10 seconds to reduce fall risk and demonstrate improved transfer/gait ability. Baseline:  06/22/2023= 13.41 sec  Goal status: NEW   ASSESSMENT:  CLINICAL IMPRESSION: Patient presents with good motivation. Treatment focused mostly on ant-shuffle exercises. Patient was able to improve overall during session and performed well with VC to increase heel strike and later pick up feet with walking. He is very loquacious and hard time dual tasking (talking with walking) and requires cues to concentrate.  Pt will continue to benefit from skilled physical therapy intervention to address impairments, improve QOL, and attain therapy goals.    OBJECTIVE IMPAIRMENTS: Abnormal gait, decreased activity tolerance, decreased balance, decreased cognition, decreased coordination, decreased endurance, decreased mobility, difficulty walking, decreased ROM, decreased strength, hypomobility, and postural dysfunction.   ACTIVITY LIMITATIONS: carrying, lifting, bending, standing, squatting, and stairs  PARTICIPATION LIMITATIONS: cleaning, shopping, community activity, and yard work  PERSONAL FACTORS: Age and 3+ comorbidities: HTN, Vertigo, arthritis  are also affecting patient's functional outcome.   REHAB POTENTIAL: Good  CLINICAL DECISION MAKING: Stable/uncomplicated  EVALUATION COMPLEXITY: Moderate  PLAN:  PT FREQUENCY: 1-2x/week  PT DURATION: 12 weeks  PLANNED INTERVENTIONS: Therapeutic exercises, Therapeutic activity, Neuromuscular re-education, Balance training, Gait training, Patient/Family education, Self Care, Joint mobilization, Joint manipulation, Stair training, Vestibular training, Canalith repositioning, Orthotic/Fit training, DME instructions, Dry Needling, Spinal manipulation, Spinal mobilization, Cryotherapy, Moist heat, Taping,  Manual therapy, and Re-evaluation  PLAN FOR NEXT SESSION:    Progress balance and therex - add to HEP as appropriate.     Chyrl London, PT  06/27/2023, 2:35 PM

## 2023-06-27 ENCOUNTER — Ambulatory Visit: Payer: Medicare Other

## 2023-06-27 DIAGNOSIS — R269 Unspecified abnormalities of gait and mobility: Secondary | ICD-10-CM

## 2023-06-27 DIAGNOSIS — G20B1 Parkinson's disease with dyskinesia, without mention of fluctuations: Secondary | ICD-10-CM

## 2023-06-27 DIAGNOSIS — R278 Other lack of coordination: Secondary | ICD-10-CM

## 2023-06-27 DIAGNOSIS — R2689 Other abnormalities of gait and mobility: Secondary | ICD-10-CM

## 2023-06-27 DIAGNOSIS — M6281 Muscle weakness (generalized): Secondary | ICD-10-CM

## 2023-06-27 DIAGNOSIS — R2681 Unsteadiness on feet: Secondary | ICD-10-CM

## 2023-06-27 DIAGNOSIS — R262 Difficulty in walking, not elsewhere classified: Secondary | ICD-10-CM

## 2023-06-29 ENCOUNTER — Ambulatory Visit: Payer: Medicare Other

## 2023-06-29 DIAGNOSIS — R269 Unspecified abnormalities of gait and mobility: Secondary | ICD-10-CM | POA: Diagnosis not present

## 2023-06-29 DIAGNOSIS — R278 Other lack of coordination: Secondary | ICD-10-CM

## 2023-06-29 DIAGNOSIS — M6281 Muscle weakness (generalized): Secondary | ICD-10-CM

## 2023-06-29 DIAGNOSIS — G20B1 Parkinson's disease with dyskinesia, without mention of fluctuations: Secondary | ICD-10-CM

## 2023-06-29 DIAGNOSIS — R2681 Unsteadiness on feet: Secondary | ICD-10-CM

## 2023-06-29 DIAGNOSIS — R262 Difficulty in walking, not elsewhere classified: Secondary | ICD-10-CM

## 2023-06-29 DIAGNOSIS — R2689 Other abnormalities of gait and mobility: Secondary | ICD-10-CM

## 2023-06-29 NOTE — Therapy (Signed)
 OUTPATIENT PHYSICAL THERAPY NEURO TREATMENT    Patient Name: Carlos Mathis MRN: [id number] DOB:[birth year], 83 y.o., male Today's Date: 06/30/2023   PCP: Dr. Layman Piety REFERRING PROVIDER: Dr. Jannett Fairly  END OF SESSION:  PT End of Session - 06/29/23 1152     Visit Number 22    Number of Visits 44    Date for PT Re-Evaluation 09/14/23    Progress Note Due on Visit 30    PT Start Time 1146    PT Stop Time 1229    PT Time Calculation (min) 43 min    Equipment Utilized During Treatment Gait belt    Activity Tolerance Patient tolerated treatment well    Behavior During Therapy WFL for tasks assessed/performed                    Past Medical History:  Diagnosis Date   Arthritis    BPH (benign prostatic hyperplasia)    CKD (chronic kidney disease), stage III (HCC)    Coronary artery disease    GERD (gastroesophageal reflux disease)    History of hiatal hernia    HLD (hyperlipidemia)    Hypertension    Long term current use of antithrombotics/antiplatelets    a.) DAPT therapy (ASA+ ticagrelor)   MCI (mild cognitive impairment) with memory loss    a.) takes apoaequorin   Migraines    OSA on CPAP    Pneumonia    Skin cancer, basal cell    STEMI (ST elevation myocardial infarction) (HCC) 07/27/2020   a.) MI while living in [place] ; underwent PCI placing 4.0 x 18 mm and 3.5 x 38 mm Resolute Onyx DES (vessels unknown/unspecified).   T2DM (type 2 diabetes mellitus) (HCC)    Vertigo    Wears dentures    Full upper, partial lower   Wears hearing aid in both ears    Past Surgical History:  Procedure Laterality Date   CARDIAC CATHETERIZATION     CATARACT EXTRACTION Right    CATARACT EXTRACTION W/PHACO Left 05/03/2022   Procedure: CATARACT EXTRACTION PHACO AND INTRAOCULAR LENS PLACEMENT (IOC) LEFT DIABETIC;  Surgeon: Jaye Fallow, MD;  Location: [facility];  Service: Ophthalmology;  Laterality: Left;  14.77 1:24.0    ESOPHAGOGASTRODUODENOSCOPY (EGD) WITH PROPOFOL  N/A 03/21/2023   Procedure: ESOPHAGOGASTRODUODENOSCOPY (EGD) WITH PROPOFOL ;  Surgeon: Therisa Bi, MD;  Location: [facility];  Service: Gastroenterology;  Laterality: N/A;   HERNIA REPAIR Left    inguinal   INSERTION OF MESH  09/01/2021   Procedure: INSERTION OF MESH;  Surgeon: Rodolph Romano, MD;  Location: [facility];  Service: General;;   JOINT REPLACEMENT     tka   UMBILICAL HERNIA REPAIR     Patient Active Problem List   Diagnosis Date Noted   Acute myocardial infarction, unspecified (HCC) 11/16/2022   Anxiety state 11/16/2022   Balanitis 11/16/2022   Benign paroxysmal positional vertigo 11/16/2022   Cardiac dysrhythmia 11/16/2022   Chronic ischemic heart disease 11/16/2022   Chronic sinusitis 11/16/2022   Gastroesophageal reflux disease 11/16/2022   Headache 11/16/2022   Counseling, unspecified 11/16/2022   History of occlusive disease of artery of lower extremity 11/16/2022   Hypertensive chronic kidney disease with stage 1 through stage 4 chronic kidney disease, or unspecified chronic kidney disease 11/16/2022   Irritable bowel syndrome 11/16/2022   Unspecified dementia, mild, without behavioral disturbance, psychotic disturbance, mood disturbance, and anxiety (HCC) 11/16/2022   Obstructive sleep apnea (adult) (pediatric) 11/16/2022   Osteoarthritis of knee 11/16/2022   Personal  history of tuberculosis 11/16/2022   Phimosis 11/16/2022   Restless legs 11/16/2022   Sensorineural hearing loss, bilateral 11/16/2022   Type 2 diabetes mellitus without complications (HCC) 11/16/2022   Diabetic polyneuropathy associated with type 2 diabetes mellitus (HCC) 06/24/2021   Healthcare maintenance 06/24/2021   CAD (coronary artery disease) 09/02/2020   Pure hypercholesterolemia 09/02/2020   Polycystic kidney 02/11/2020   Benign prostatic hyperplasia 02/11/2020   Essential hypertension 02/11/2020   Acquired cyst of kidney  07/09/2019   Spinal stenosis of lumbar region 05/30/2019   Cervical spondylosis 03/12/2019   Dysphagia 03/26/2015   Hypermetropia 02/04/2015   Presbyopia 02/04/2015   Senile nuclear cataract 02/04/2015   Epistaxis 09/23/2014   Laryngopharyngeal reflux (LPR) 09/23/2014    ONSET DATE: worse since Jan 2024  REFERRING DIAG:  Diagnosis  G20.A1 (ICD-10-CM) - Parkinson's disease    THERAPY DIAG:  Abnormality of gait and mobility  Unsteadiness on feet  Other abnormalities of gait and mobility  Difficulty in walking, not elsewhere classified  Other lack of coordination  Muscle weakness (generalized)  Parkinson's disease with dyskinesia, unspecified whether manifestations fluctuate (HCC)  Rationale for Evaluation and Treatment: Rehabilitation  SUBJECTIVE:                                                                                                                                                                                             SUBJECTIVE STATEMENT: Patient reports doing well - preparing for winter storm.     Pt accompanied by: self  PERTINENT HISTORY:   Per neurology note submitted in chart by Kaitlin Paich, PA.  1. 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 Memory loss starting 2018 with cognitive decline over 2022. Patient reports difficulty remembering conversations, difficulty recognizing old friends, getting lost while driving. Per spouse patient can become aggressive behind the wheel. Patient reports visual hallucinations (dark areas out of his periphery) and audio hallucinations. Wife assists with medications and finances. Has become more emotional over the last year and sometimes becomes angry with spouse which is unusual. Denies alcohol use or difficulty sleeping. Per wife  significant procrastiantion, low motivation, more hunched forward posture, balance issues, and gets going walking and has a difficult time stopping.   Patient was cleared to drive per driving assessment, however, we discussed that memory may not keep up with the physical ability to drive if condition continues to get worse   Ambulatory Referral to Physical Therapy [facility] [address] [facility])   2. Peripheral neuropathy- decreased sensation with some  pain in the feet in a patient with history of agent orange exposure.  3. Orthostatic Lightheadedness/Vertigo, chronic episodic vertigo  Orthostatic hypotension (neurogenic) can be seen in many conditions such as parkinsonism, diabetic neuropathy, pure autonomic failure etc. Patient should have work up to rule out other causes first such as dehydration, cardiac issues, iatrogenic due to meds etc. Lightheadedness, fainting, fatigue, blurry vision, weakness etc can happen as symptoms. Patient should drink plenty of water, wear compression stockings, exercise, liberal salts, elevate the head end of the bed etc. Fludrocortisone, Midodrine, droxidopa etc. Can be tried in certain patients.   PAIN:  Are you having pain? No  PRECAUTIONS: Fall  RED FLAGS: None   WEIGHT BEARING RESTRICTIONS: No  FALLS: Has patient fallen in last 6 months? No  LIVING ENVIRONMENT: Lives with: Wife Lives in: House/apartment Stairs: Yes: External: 5 steps; can reach both Has following equipment at home: Single point cane and Walker - 2 wheeled  PLOF: Independent  PATIENT GOALS: I want to be stronger and more steady  OBJECTIVE:   DIAGNOSTIC FINDINGS: CLINICAL DATA:  Provided history: Mild cognitive impairment with memory loss.   EXAM: MRI HEAD WITHOUT CONTRAST   TECHNIQUE: Multiplanar, multiecho pulse sequences of the brain and surrounding structures were obtained without intravenous contrast.   COMPARISON:  Head CT 07/31/2021.   FINDINGS: Brain:   Mild-to-moderate  generalized cerebral atrophy. Commensurate prominence of the ventricles and sulci. Comparatively mild cerebellar atrophy.   Multifocal T2 FLAIR hyperintense signal abnormality within the cerebral white matter and pons, nonspecific but compatible with moderate chronic small vessel ischemic disease.   Mild chronic small vessel ischemic changes within the thalami.   There is no acute infarct.   No evidence of an intracranial mass.   No chronic intracranial blood products.   No extra-axial fluid collection.   No midline shift.   Vascular: Maintained flow voids within the proximal large arterial vessels.   Skull and upper cervical spine: No focal suspicious marrow lesion. Degenerative changes and ligamentous hypertrophy at the C1-C2 articulation.   Sinuses/Orbits: No mass or acute finding within the imaged orbits. Prior right ocular lens replacement. Mild mucosal thickening within the bilateral ethmoid and right sphenoid sinuses.   Other: Small-volume fluid within the right mastoid air cells.   IMPRESSION: 1. No evidence of acute intracranial abnormality. 2. Moderate chronic small vessel ischemic changes within the cerebral white matter and pons. 3. Mild chronic small-vessel ischemic changes within the thalami. 4. Mild-to-moderate generalized cerebral atrophy. Comparatively mild cerebellar atrophy. 5. Mild mucosal thickening within the bilateral ethmoid and right sphenoid sinuses. 6. Small-volume fluid within the right mastoid air cells.     Electronically Signed   By: Rockey Childs D.O.   On: 11/11/2021 17:53    COGNITION: Overall cognitive status: Impaired   SENSATION: Light touch: Impaired   COORDINATION: Some difficulty sequencing   POSTURE: forward head   LOWER EXTREMITY MMT:    MMT Right Eval Left Eval  Hip flexion 3+ 4  Hip extension 4 4  Hip abduction 4 4  Hip adduction 4 4  Hip internal rotation 4 4  Hip external rotation 4 4  Knee flexion  4 4  Knee extension 4 4  Ankle dorsiflexion    Ankle plantarflexion    Ankle inversion    Ankle eversion    (Blank rows = not tested)    TRANSFERS: Assistive device utilized: None  Sit to stand: Complete Independence Stand to sit: Complete Independence Chair to chair: Complete  Independence Floor:  Not tested  GAIT: Gait pattern: step through pattern, decreased arm swing- Right, decreased arm swing- Left, decreased step length- Right, and decreased step length- Left Distance walked: 100+ feet Assistive device utilized: None Level of assistance: SBA Comments: Shuffling gait  FUNCTIONAL TESTS:  5 times sit to stand: 18.48 sec without UE Support  Timed up and go (TUG): 16.42 sec without UE support 6 minute walk test: To be tested 2nd visit 10 meter walk test: 14.84 and 14.62 sec  = 0.69 m/s avg without UE support Berg Balance Scale: 43/56  PATIENT SURVEYS:  FOTO 49 with goal of 59  TODAY'S TREATMENT:                                                                                                                              DATE: 06/30/23    NMR:  In // bars- step over orange hurdle, an airex pad then down to end - then retro gait back to pad- step up backward and retro step over orange hurdle- x 8 times.   Side step in // bars up/over hurdle then airex pad then up onto 6 block- down and back x 8 times   Step tap onto 12 block-  2 sets x 20 reps alt LE   FWD ambulation (looking into mirror with VC to heel strike so he can see the sole of shoe)  then Retro gait back (VC for large steps) x 15 trips/down and back.     THEREX:   Standing with back against wall- Overhead raises Holding onto small green theraball x 10. Then added chopping activity at wall x 10 reps each direction Sit to stand into toe raises 2 sets of 15 reps      PATIENT EDUCATION: Education details: Purpose of PT and plan of care Person educated: Patient Education method: Explanation Education  comprehension: verbalized understanding  HOME EXERCISE PROGRAM:   GOALS: Goals reviewed with patient? Yes  SHORT TERM GOALS: Target date: 04/05/2023  Pt will be independent with HEP in order to improve strength and balance in order to decrease fall risk and improve function at home and work.   Baseline: EVAL- No formal HEP In place; 04/11/2023- patient reports walking daily and attempting not to shuffle and going to [place] steady. 06/22/2023- Patient reports inconsistent with HEP due to [facility] and traveling. Goal status: ONGOING   LONG TERM GOALS: Target date: 09/14/2023  1.  Patient (> 91 years old) will complete five times sit to stand test in < 15 seconds indicating an increased LE strength and improved balance. Baseline: EVAL= 18.48 sec without UE support; 04/11/2023= 14.94 sec without UE support. 06/22/2023= 16.54 sec without UE support Goal status: PROGRESSING  2.  Patient will increase FOTO score to equal to or greater than  59   to demonstrate statistically significant improvement in mobility and quality of life.  Baseline: EVAL=49; 04/11/2023= 59; 06/22/2023=64 Goal status: MET  3.  Patient will increase Berg Balance score by > 6 points to demonstrate decreased fall risk during functional activities. Baseline: EVAL=43/56; 06/22/2023=44/56 Goal status: ONGOING   4.  Patient will reduce timed up and go to <11 seconds to reduce fall risk and demonstrate improved transfer/gait ability. Baseline: EVAL=16.42; 04/11/2023= 10 sec without UE support Goal status: MET  5.  Patient will increase 10 meter walk test to >1.50m/s as to improve gait speed for better community ambulation and to reduce fall risk. Baseline: EVAL= 0.69; 04/11/2023= 1.0 m/s without AD. 06/22/2023=0.82 m/s Goal status: Ongoing  6. Patient will increase six minute walk test distance to >1000 for progression to community ambulator and improve gait ability Baseline:03/08/23: 575 ft with progressive shuffling and flexion  posture throughout. 04/11/2023= 925 feet with some festinating gait- worse with increased distance.  Goal status: PROGRESSING  7.   Patient will reduce timed up and go to <10 seconds to reduce fall risk and demonstrate improved transfer/gait ability. Baseline:  06/22/2023= 13.41 sec  Goal status: NEW   ASSESSMENT:  CLINICAL IMPRESSION: Treatment continues per plan of care- focusing on exercises design to promote increased foot clearance with walking. He is able to perform these exercises well yet reverts back to shuffle with walking requiring constant cues to take larger step and stop and regroup. He did well with posture exercises - no complain of pain and able to perform good thoracic extension based wall exercises well today.  Pt will continue to benefit from skilled physical therapy intervention to address impairments, improve QOL, and attain therapy goals.    OBJECTIVE IMPAIRMENTS: Abnormal gait, decreased activity tolerance, decreased balance, decreased cognition, decreased coordination, decreased endurance, decreased mobility, difficulty walking, decreased ROM, decreased strength, hypomobility, and postural dysfunction.   ACTIVITY LIMITATIONS: carrying, lifting, bending, standing, squatting, and stairs  PARTICIPATION LIMITATIONS: cleaning, shopping, community activity, and yard work  PERSONAL FACTORS: Age and 3+ comorbidities: HTN, Vertigo, arthritis  are also affecting patient's functional outcome.   REHAB POTENTIAL: Good  CLINICAL DECISION MAKING: Stable/uncomplicated  EVALUATION COMPLEXITY: Moderate  PLAN:  PT FREQUENCY: 1-2x/week  PT DURATION: 12 weeks  PLANNED INTERVENTIONS: Therapeutic exercises, Therapeutic activity, Neuromuscular re-education, Balance training, Gait training, Patient/Family education, Self Care, Joint mobilization, Joint manipulation, Stair training, Vestibular training, Canalith repositioning, Orthotic/Fit training, DME instructions, Dry Needling,  Spinal manipulation, Spinal mobilization, Cryotherapy, Moist heat, Taping, Manual therapy, and Re-evaluation  PLAN FOR NEXT SESSION:    Progress balance and therex - add to HEP as appropriate.     Chyrl London, PT  06/30/2023, 8:37 AM

## 2023-07-04 ENCOUNTER — Ambulatory Visit: Payer: Medicare Other

## 2023-07-05 ENCOUNTER — Other Ambulatory Visit: Payer: Self-pay | Admitting: Student

## 2023-07-05 DIAGNOSIS — R519 Headache, unspecified: Secondary | ICD-10-CM

## 2023-07-06 ENCOUNTER — Ambulatory Visit: Payer: Medicare Other

## 2023-07-06 DIAGNOSIS — G20B1 Parkinson's disease with dyskinesia, without mention of fluctuations: Secondary | ICD-10-CM

## 2023-07-06 DIAGNOSIS — R262 Difficulty in walking, not elsewhere classified: Secondary | ICD-10-CM

## 2023-07-06 DIAGNOSIS — R2689 Other abnormalities of gait and mobility: Secondary | ICD-10-CM

## 2023-07-06 DIAGNOSIS — R269 Unspecified abnormalities of gait and mobility: Secondary | ICD-10-CM

## 2023-07-06 DIAGNOSIS — R2681 Unsteadiness on feet: Secondary | ICD-10-CM

## 2023-07-06 DIAGNOSIS — M6281 Muscle weakness (generalized): Secondary | ICD-10-CM

## 2023-07-06 DIAGNOSIS — R278 Other lack of coordination: Secondary | ICD-10-CM

## 2023-07-06 NOTE — Therapy (Signed)
OUTPATIENT PHYSICAL THERAPY NEURO TREATMENT    Patient Name: Carlos Mathis MRN: [id number] DOB:05/15/1941, 83 y.o., male Today's Date: 07/06/2023   PCP: Dr. Einar Crow REFERRING PROVIDER: Dr. Cristopher Peru  END OF SESSION:  PT End of Session - 07/06/23 1732     Visit Number 23    Number of Visits 44    Date for PT Re-Evaluation 09/14/23    Progress Note Due on Visit 30    PT Start Time 1146    PT Stop Time 1230    PT Time Calculation (min) 44 min    Equipment Utilized During Treatment Gait belt    Activity Tolerance Patient tolerated treatment well    Behavior During Therapy WFL for tasks assessed/performed                     Past Medical History:  Diagnosis Date   Arthritis    BPH (benign prostatic hyperplasia)    CKD (chronic kidney disease), stage III (HCC)    Coronary artery disease    GERD (gastroesophageal reflux disease)    History of hiatal hernia    HLD (hyperlipidemia)    Hypertension    Long term current use of antithrombotics/antiplatelets    a.) DAPT therapy (ASA+ ticagrelor)   MCI (mild cognitive impairment) with memory loss    a.) takes apoaequorin   Migraines    OSA on CPAP    Pneumonia    Skin cancer, basal cell    STEMI (ST elevation myocardial infarction) (HCC) 07/27/2020   a.) MI while living in [place]; underwent PCI placing 4.0 x 18 mm and 3.5 x 38 mm Resolute Onyx DES (vessels unknown/unspecified).   T2DM (type 2 diabetes mellitus) (HCC)    Vertigo    Wears dentures    Full upper, partial lower   Wears hearing aid in both ears    Past Surgical History:  Procedure Laterality Date   CARDIAC CATHETERIZATION     CATARACT EXTRACTION Right    CATARACT EXTRACTION W/PHACO Left 05/03/2022   Procedure: CATARACT EXTRACTION PHACO AND INTRAOCULAR LENS PLACEMENT (IOC) LEFT DIABETIC;  Surgeon: Galen Manila, MD;  Location: [facility];  Service: Ophthalmology;  Laterality: Left;  14.77 1:24.0    ESOPHAGOGASTRODUODENOSCOPY (EGD) WITH PROPOFOL N/A 03/21/2023   Procedure: ESOPHAGOGASTRODUODENOSCOPY (EGD) WITH PROPOFOL;  Surgeon: [name], MD;  Location: [facility];  Service: Gastroenterology;  Laterality: N/A;   HERNIA REPAIR Left    inguinal   INSERTION OF MESH  09/01/2021   Procedure: INSERTION OF MESH;  Surgeon: Carolan Shiver, MD;  Location: [facility] ORS;  Service: General;;   JOINT REPLACEMENT     tka   UMBILICAL HERNIA REPAIR     Patient Active Problem List   Diagnosis Date Noted   Acute myocardial infarction, unspecified (HCC) 11/16/2022   Anxiety state 11/16/2022   Balanitis 11/16/2022   Benign paroxysmal positional vertigo 11/16/2022   Cardiac dysrhythmia 11/16/2022   Chronic ischemic heart disease 11/16/2022   Chronic sinusitis 11/16/2022   Gastroesophageal reflux disease 11/16/2022   Headache 11/16/2022   Counseling, unspecified 11/16/2022   History of occlusive disease of artery of lower extremity 11/16/2022   Hypertensive chronic kidney disease with stage 1 through stage 4 chronic kidney disease, or unspecified chronic kidney disease 11/16/2022   Irritable bowel syndrome 11/16/2022   Unspecified dementia, mild, without behavioral disturbance, psychotic disturbance, mood disturbance, and anxiety (HCC) 11/16/2022   Obstructive sleep apnea (adult) (pediatric) 11/16/2022   Osteoarthritis of knee 11/16/2022  Personal history of tuberculosis 11/16/2022   Phimosis 11/16/2022   Restless legs 11/16/2022   Sensorineural hearing loss, bilateral 11/16/2022   Type 2 diabetes mellitus without complications (HCC) 11/16/2022   Diabetic polyneuropathy associated with type 2 diabetes mellitus (HCC) 06/24/2021   Healthcare maintenance 06/24/2021   CAD (coronary artery disease) 09/02/2020   Pure hypercholesterolemia 09/02/2020   Polycystic kidney 02/11/2020   Benign prostatic hyperplasia 02/11/2020   Essential hypertension 02/11/2020   Acquired cyst of kidney  07/09/2019   Spinal stenosis of lumbar region 05/30/2019   Cervical spondylosis 03/12/2019   Dysphagia 03/26/2015   Hypermetropia 02/04/2015   Presbyopia 02/04/2015   Senile nuclear cataract 02/04/2015   Epistaxis 09/23/2014   Laryngopharyngeal reflux (LPR) 09/23/2014    ONSET DATE: worse since Jan 2024  REFERRING DIAG:  Diagnosis  G20.A1 (ICD-10-CM) - Parkinson's disease    THERAPY DIAG:  Abnormality of gait and mobility  Unsteadiness on feet  Other abnormalities of gait and mobility  Difficulty in walking, not elsewhere classified  Other lack of coordination  Muscle weakness (generalized)  Parkinson's disease with dyskinesia, unspecified whether manifestations fluctuate (HCC)  Rationale for Evaluation and Treatment: Rehabilitation  SUBJECTIVE:                                                                                                                                                                                             SUBJECTIVE STATEMENT: Patient reports doing well overall and going to continue with rock steady classes.   Pt accompanied by: self  PERTINENT HISTORY:   Per neurology note submitted in chart by Janice Coffin, PA.  1. 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 Memory loss starting 2018 with cognitive decline over 2022. Patient reports difficulty remembering conversations, difficulty recognizing old friends, getting lost while driving. Per spouse patient can become aggressive behind the wheel. Patient reports visual hallucinations (dark areas out of his periphery) and audio hallucinations. Wife assists with medications and finances. Has become more emotional over the last year and sometimes becomes angry with spouse which is unusual. Denies alcohol use or difficulty  sleeping. Per wife significant procrastiantion, low motivation, more hunched forward posture, balance issues, and gets going walking and has a difficult time stopping.   Patient was cleared to drive per driving assessment, however, we discussed that memory may not keep up with the physical ability to drive if condition continues to get worse   Ambulatory Referral to Physical Therapy [facility])   2. Peripheral neuropathy- decreased  sensation with some pain in the feet in a patient with history of agent orange exposure.  3. Orthostatic Lightheadedness/Vertigo, chronic episodic vertigo  Orthostatic hypotension (neurogenic) can be seen in many conditions such as parkinsonism, diabetic neuropathy, pure autonomic failure etc. Patient should have work up to rule out other causes first such as dehydration, cardiac issues, iatrogenic due to meds etc. Lightheadedness, fainting, fatigue, blurry vision, weakness etc can happen as symptoms. Patient should drink plenty of water, wear compression stockings, exercise, liberal salts, elevate the head end of the bed etc. Fludrocortisone, Midodrine, droxidopa etc. Can be tried in certain patients.   PAIN:  Are you having pain? No  PRECAUTIONS: Fall  RED FLAGS: None   WEIGHT BEARING RESTRICTIONS: No  FALLS: Has patient fallen in last 6 months? No  LIVING ENVIRONMENT: Lives with: Wife Lives in: House/apartment Stairs: Yes: External: 5 steps; can reach both Has following equipment at home: Single point cane and Walker - 2 wheeled  PLOF: Independent  PATIENT GOALS: I want to be stronger and more steady  OBJECTIVE:   DIAGNOSTIC FINDINGS: CLINICAL DATA:  Provided history: Mild cognitive impairment with memory loss.   EXAM: MRI HEAD WITHOUT CONTRAST   TECHNIQUE: Multiplanar, multiecho pulse sequences of the brain and surrounding structures were obtained without intravenous contrast.   COMPARISON:  Head CT 07/31/2021.   FINDINGS: Brain:    Mild-to-moderate generalized cerebral atrophy. Commensurate prominence of the ventricles and sulci. Comparatively mild cerebellar atrophy.   Multifocal T2 FLAIR hyperintense signal abnormality within the cerebral white matter and pons, nonspecific but compatible with moderate chronic small vessel ischemic disease.   Mild chronic small vessel ischemic changes within the thalami.   There is no acute infarct.   No evidence of an intracranial mass.   No chronic intracranial blood products.   No extra-axial fluid collection.   No midline shift.   Vascular: Maintained flow voids within the proximal large arterial vessels.   Skull and upper cervical spine: No focal suspicious marrow lesion. Degenerative changes and ligamentous hypertrophy at the C1-C2 articulation.   Sinuses/Orbits: No mass or acute finding within the imaged orbits. Prior right ocular lens replacement. Mild mucosal thickening within the bilateral ethmoid and right sphenoid sinuses.   Other: Small-volume fluid within the right mastoid air cells.   IMPRESSION: 1. No evidence of acute intracranial abnormality. 2. Moderate chronic small vessel ischemic changes within the cerebral white matter and pons. 3. Mild chronic small-vessel ischemic changes within the thalami. 4. Mild-to-moderate generalized cerebral atrophy. Comparatively mild cerebellar atrophy. 5. Mild mucosal thickening within the bilateral ethmoid and right sphenoid sinuses. 6. Small-volume fluid within the right mastoid air cells.     Electronically Signed   By: Jackey Loge D.O.   On: 11/11/2021 17:53    COGNITION: Overall cognitive status: Impaired   SENSATION: Light touch: Impaired   COORDINATION: Some difficulty sequencing   POSTURE: forward head   LOWER EXTREMITY MMT:    MMT Right Eval Left Eval  Hip flexion 3+ 4  Hip extension 4 4  Hip abduction 4 4  Hip adduction 4 4  Hip internal rotation 4 4  Hip external rotation  4 4  Knee flexion 4 4  Knee extension 4 4  Ankle dorsiflexion    Ankle plantarflexion    Ankle inversion    Ankle eversion    (Blank rows = not tested)    TRANSFERS: Assistive device utilized: None  Sit to stand: Complete Independence Stand to sit: Complete Independence Chair  to chair: Complete Independence Floor:  Not tested  GAIT: Gait pattern: step through pattern, decreased arm swing- Right, decreased arm swing- Left, decreased step length- Right, and decreased step length- Left Distance walked: 100+ feet Assistive device utilized: None Level of assistance: SBA Comments: Shuffling gait  FUNCTIONAL TESTS:  5 times sit to stand: 18.48 sec without UE Support  Timed up and go (TUG): 16.42 sec without UE support 6 minute walk test: To be tested 2nd visit 10 meter walk test: 14.84 and 14.62 sec  = 0.69 m/s avg without UE support Berg Balance Scale: 43/56  PATIENT SURVEYS:  FOTO 49 with goal of 61  TODAY'S TREATMENT:                                                                                                                              DATE: 07/06/23    Gait training  Gait with upright 4WW - training in use of new assistive device-  180 turning, applying brakes, sitting and sit to stand. Approx 15 min of walking with short rest breaks provided. Patient was able to adjust to his height and forearm support and stated he enjoyed walking.  Gait speed measured at 0.58m/s  avg today  *observed more erect posture with use of device and no LOB throughout walk.  NMR:   In // bars- step over  1/2 foam roll x 2 then up onto 6" block (all in // bars) then back x 8 times.   Side step in // bars up/over hurdle then airex pad then up onto 6" block- down and back x 8 times    FWD ambulation (looking into mirror with VC to heel strike so he can see the sole of shoe)  then Retro gait back (VC for large steps) x 15 trips/down and back.           PATIENT  EDUCATION: Education details: Purpose of PT and plan of care Person educated: Patient Education method: Explanation Education comprehension: verbalized understanding  HOME EXERCISE PROGRAM:   GOALS: Goals reviewed with patient? Yes  SHORT TERM GOALS: Target date: 04/05/2023  Pt will be independent with HEP in order to improve strength and balance in order to decrease fall risk and improve function at home and work.   Baseline: EVAL- No formal HEP In place; 04/11/2023- patient reports walking daily and attempting not to shuffle and going to [place] steady. 06/22/2023- Patient reports inconsistent with HEP due to [facility] and traveling. Goal status: ONGOING   LONG TERM GOALS: Target date: 09/14/2023  1.  Patient (> 34 years old) will complete five times sit to stand test in < 15 seconds indicating an increased LE strength and improved balance. Baseline: EVAL= 18.48 sec without UE support; 04/11/2023= 14.94 sec without UE support. 06/22/2023= 16.54 sec without UE support Goal status: PROGRESSING  2.  Patient will increase FOTO score to equal to or greater than  59   to demonstrate statistically  significant improvement in mobility and quality of life.  Baseline: EVAL=49; 04/11/2023= 59; 06/22/2023=64 Goal status: MET   3.  Patient will increase Berg Balance score by > 6 points to demonstrate decreased fall risk during functional activities. Baseline: EVAL=43/56; 06/22/2023=44/56 Goal status: ONGOING   4.  Patient will reduce timed up and go to <11 seconds to reduce fall risk and demonstrate improved transfer/gait ability. Baseline: EVAL=16.42; 04/11/2023= 10 sec without UE support Goal status: MET  5.  Patient will increase 10 meter walk test to >1.85m/s as to improve gait speed for better community ambulation and to reduce fall risk. Baseline: EVAL= 0.69; 04/11/2023= 1.0 m/s without AD. 06/22/2023=0.82 m/s Goal status: Ongoing  6. Patient will increase six minute walk test distance to >1000  for progression to community ambulator and improve gait ability Baseline:03/08/23: 575 ft with progressive shuffling and flexion posture throughout. 04/11/2023= 925 feet with some festinating gait- worse with increased distance.  Goal status: PROGRESSING  7.   Patient will reduce timed up and go to <10 seconds to reduce fall risk and demonstrate improved transfer/gait ability. Baseline:  06/22/2023= 13.41 sec  Goal status: NEW   ASSESSMENT:  CLINICAL IMPRESSION: Patient presented with good motivation for treatment. Discussed decreasing frequency down to 1x/week due to insurance limitations. He will now continue with rock steady classes to stay as active as possible. Treatment continued to focus on foot clearing exercises and today he was instructed in use of upright walker. He did perform well with use of device- able to improve his gait speed all while maintaining improved posture and no shuffling. He will benefit from further practice next session.  Pt will continue to benefit from skilled physical therapy intervention to address impairments, improve QOL, and attain therapy goals.    OBJECTIVE IMPAIRMENTS: Abnormal gait, decreased activity tolerance, decreased balance, decreased cognition, decreased coordination, decreased endurance, decreased mobility, difficulty walking, decreased ROM, decreased strength, hypomobility, and postural dysfunction.   ACTIVITY LIMITATIONS: carrying, lifting, bending, standing, squatting, and stairs  PARTICIPATION LIMITATIONS: cleaning, shopping, community activity, and yard work  PERSONAL FACTORS: Age and 3+ comorbidities: HTN, Vertigo, arthritis  are also affecting patient's functional outcome.   REHAB POTENTIAL: Good  CLINICAL DECISION MAKING: Stable/uncomplicated  EVALUATION COMPLEXITY: Moderate  PLAN:  PT FREQUENCY: 1-2x/week  PT DURATION: 12 weeks  PLANNED INTERVENTIONS: Therapeutic exercises, Therapeutic activity, Neuromuscular re-education,  Balance training, Gait training, Patient/Family education, Self Care, Joint mobilization, Joint manipulation, Stair training, Vestibular training, Canalith repositioning, Orthotic/Fit training, DME instructions, Dry Needling, Spinal manipulation, Spinal mobilization, Cryotherapy, Moist heat, Taping, Manual therapy, and Re-evaluation  PLAN FOR NEXT SESSION:    Progress balance and therex - add to HEP as appropriate.     Louis Meckel, PT  07/06/2023, 5:33 PM

## 2023-07-11 ENCOUNTER — Ambulatory Visit: Payer: Medicare Other

## 2023-07-13 ENCOUNTER — Ambulatory Visit: Payer: Medicare Other

## 2023-07-13 DIAGNOSIS — G20B1 Parkinson's disease with dyskinesia, without mention of fluctuations: Secondary | ICD-10-CM

## 2023-07-13 DIAGNOSIS — R269 Unspecified abnormalities of gait and mobility: Secondary | ICD-10-CM | POA: Diagnosis not present

## 2023-07-13 DIAGNOSIS — R278 Other lack of coordination: Secondary | ICD-10-CM

## 2023-07-13 DIAGNOSIS — M6281 Muscle weakness (generalized): Secondary | ICD-10-CM

## 2023-07-13 DIAGNOSIS — R2681 Unsteadiness on feet: Secondary | ICD-10-CM

## 2023-07-13 DIAGNOSIS — R2689 Other abnormalities of gait and mobility: Secondary | ICD-10-CM

## 2023-07-13 DIAGNOSIS — R262 Difficulty in walking, not elsewhere classified: Secondary | ICD-10-CM

## 2023-07-13 NOTE — Therapy (Addendum)
OUTPATIENT PHYSICAL THERAPY NEURO TREATMENT    Patient Name: Carlos Mathis MRN: [id number] DOB:[birth year], 83 y.o., male Today's Date: 07/14/2023   PCP: Dr. Einar Crow REFERRING PROVIDER: Dr. Cristopher Peru  END OF SESSION:  PT End of Session - 07/13/23 1152     Visit Number 24    Number of Visits 44    Date for PT Re-Evaluation 09/14/23    Progress Note Due on Visit 30    PT Start Time 1148    PT Stop Time 1230    PT Time Calculation (min) 42 min    Equipment Utilized During Treatment Gait belt    Activity Tolerance Patient tolerated treatment well    Behavior During Therapy WFL for tasks assessed/performed                     Past Medical History:  Diagnosis Date   Arthritis    BPH (benign prostatic hyperplasia)    CKD (chronic kidney disease), stage III (HCC)    Coronary artery disease    GERD (gastroesophageal reflux disease)    History of hiatal hernia    HLD (hyperlipidemia)    Hypertension    Long term current use of antithrombotics/antiplatelets    a.) DAPT therapy (ASA+ ticagrelor)   MCI (mild cognitive impairment) with memory loss    a.) takes apoaequorin   Migraines    OSA on CPAP    Pneumonia    Skin cancer, basal cell    STEMI (ST elevation myocardial infarction) (HCC) 07/27/2020   a.) MI while living in [place]; underwent PCI placing 4.0 x 18 mm and 3.5 x 38 mm Resolute Onyx DES (vessels unknown/unspecified).   T2DM (type 2 diabetes mellitus) (HCC)    Vertigo    Wears dentures    Full upper, partial lower   Wears hearing aid in both ears    Past Surgical History:  Procedure Laterality Date   CARDIAC CATHETERIZATION     CATARACT EXTRACTION Right    CATARACT EXTRACTION W/PHACO Left 05/03/2022   Procedure: CATARACT EXTRACTION PHACO AND INTRAOCULAR LENS PLACEMENT (IOC) LEFT DIABETIC;  Surgeon: Galen Manila, MD;  Location: [facility];  Service: Ophthalmology;  Laterality: Left;  14.77 1:24.0    ESOPHAGOGASTRODUODENOSCOPY (EGD) WITH PROPOFOL N/A 03/21/2023   Procedure: ESOPHAGOGASTRODUODENOSCOPY (EGD) WITH PROPOFOL;  Surgeon: [name], MD;  Location: [facility];  Service: Gastroenterology;  Laterality: N/A;   HERNIA REPAIR Left    inguinal   INSERTION OF MESH  09/01/2021   Procedure: INSERTION OF MESH;  Surgeon: Carolan Shiver, MD;  Location: [facility];  Service: General;;   JOINT REPLACEMENT     tka   UMBILICAL HERNIA REPAIR     Patient Active Problem List   Diagnosis Date Noted   Acute myocardial infarction, unspecified (HCC) 11/16/2022   Anxiety state 11/16/2022   Balanitis 11/16/2022   Benign paroxysmal positional vertigo 11/16/2022   Cardiac dysrhythmia 11/16/2022   Chronic ischemic heart disease 11/16/2022   Chronic sinusitis 11/16/2022   Gastroesophageal reflux disease 11/16/2022   Headache 11/16/2022   Counseling, unspecified 11/16/2022   History of occlusive disease of artery of lower extremity 11/16/2022   Hypertensive chronic kidney disease with stage 1 through stage 4 chronic kidney disease, or unspecified chronic kidney disease 11/16/2022   Irritable bowel syndrome 11/16/2022   Unspecified dementia, mild, without behavioral disturbance, psychotic disturbance, mood disturbance, and anxiety (HCC) 11/16/2022   Obstructive sleep apnea (adult) (pediatric) 11/16/2022   Osteoarthritis of knee 11/16/2022  Personal history of tuberculosis 11/16/2022   Phimosis 11/16/2022   Restless legs 11/16/2022   Sensorineural hearing loss, bilateral 11/16/2022   Type 2 diabetes mellitus without complications (HCC) 11/16/2022   Diabetic polyneuropathy associated with type 2 diabetes mellitus (HCC) 06/24/2021   Healthcare maintenance 06/24/2021   CAD (coronary artery disease) 09/02/2020   Pure hypercholesterolemia 09/02/2020   Polycystic kidney 02/11/2020   Benign prostatic hyperplasia 02/11/2020   Essential hypertension 02/11/2020   Acquired cyst of kidney  07/09/2019   Spinal stenosis of lumbar region 05/30/2019   Cervical spondylosis 03/12/2019   Dysphagia 03/26/2015   Hypermetropia 02/04/2015   Presbyopia 02/04/2015   Senile nuclear cataract 02/04/2015   Epistaxis 09/23/2014   Laryngopharyngeal reflux (LPR) 09/23/2014    ONSET DATE: worse since Jan 2024  REFERRING DIAG:  Diagnosis  G20.A1 (ICD-10-CM) - Parkinson's disease    THERAPY DIAG:  Abnormality of gait and mobility  Unsteadiness on feet  Other abnormalities of gait and mobility  Difficulty in walking, not elsewhere classified  Other lack of coordination  Muscle weakness (generalized)  Parkinson's disease with dyskinesia, unspecified whether manifestations fluctuate (HCC)  Rationale for Evaluation and Treatment: Rehabilitation  SUBJECTIVE:                                                                                                                                                                                             SUBJECTIVE STATEMENT: Patient reports hanging in there. States elbow has been sore with boxing in [facility] class.   Pt accompanied by: self  PERTINENT HISTORY:   Per neurology note submitted in chart by Janice Coffin, PA.  1. 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 Memory loss starting 2018 with cognitive decline over 2022. Patient reports difficulty remembering conversations, difficulty recognizing old friends, getting lost while driving. Per spouse patient can become aggressive behind the wheel. Patient reports visual hallucinations (dark areas out of his periphery) and audio hallucinations. Wife assists with medications and finances. Has become more emotional over the last year and sometimes becomes angry with spouse which is unusual. Denies alcohol use or  difficulty sleeping. Per wife significant procrastiantion, low motivation, more hunched forward posture, balance issues, and gets going walking and has a difficult time stopping.   Patient was cleared to drive per driving assessment, however, we discussed that memory may not keep up with the physical ability to drive if condition continues to get worse   Ambulatory Referral to Physical Therapy [facility])   2.  Peripheral neuropathy- decreased sensation with some pain in the feet in a patient with history of agent orange exposure.  3. Orthostatic Lightheadedness/Vertigo, chronic episodic vertigo  Orthostatic hypotension (neurogenic) can be seen in many conditions such as parkinsonism, diabetic neuropathy, pure autonomic failure etc. Patient should have work up to rule out other causes first such as dehydration, cardiac issues, iatrogenic due to meds etc. Lightheadedness, fainting, fatigue, blurry vision, weakness etc can happen as symptoms. Patient should drink plenty of water, wear compression stockings, exercise, liberal salts, elevate the head end of the bed etc. Fludrocortisone, Midodrine, droxidopa etc. Can be tried in certain patients.   PAIN:  Are you having pain? No  PRECAUTIONS: Fall  RED FLAGS: None   WEIGHT BEARING RESTRICTIONS: No  FALLS: Has patient fallen in last 6 months? No  LIVING ENVIRONMENT: Lives with: Wife Lives in: House/apartment Stairs: Yes: External: 5 steps; can reach both Has following equipment at home: Single point cane and Walker - 2 wheeled  PLOF: Independent  PATIENT GOALS: I want to be stronger and more steady  OBJECTIVE:   DIAGNOSTIC FINDINGS: CLINICAL DATA:  Provided history: Mild cognitive impairment with memory loss.   EXAM: MRI HEAD WITHOUT CONTRAST   TECHNIQUE: Multiplanar, multiecho pulse sequences of the brain and surrounding structures were obtained without intravenous contrast.   COMPARISON:  Head CT 07/31/2021.    FINDINGS: Brain:   Mild-to-moderate generalized cerebral atrophy. Commensurate prominence of the ventricles and sulci. Comparatively mild cerebellar atrophy.   Multifocal T2 FLAIR hyperintense signal abnormality within the cerebral white matter and pons, nonspecific but compatible with moderate chronic small vessel ischemic disease.   Mild chronic small vessel ischemic changes within the thalami.   There is no acute infarct.   No evidence of an intracranial mass.   No chronic intracranial blood products.   No extra-axial fluid collection.   No midline shift.   Vascular: Maintained flow voids within the proximal large arterial vessels.   Skull and upper cervical spine: No focal suspicious marrow lesion. Degenerative changes and ligamentous hypertrophy at the C1-C2 articulation.   Sinuses/Orbits: No mass or acute finding within the imaged orbits. Prior right ocular lens replacement. Mild mucosal thickening within the bilateral ethmoid and right sphenoid sinuses.   Other: Small-volume fluid within the right mastoid air cells.   IMPRESSION: 1. No evidence of acute intracranial abnormality. 2. Moderate chronic small vessel ischemic changes within the cerebral white matter and pons. 3. Mild chronic small-vessel ischemic changes within the thalami. 4. Mild-to-moderate generalized cerebral atrophy. Comparatively mild cerebellar atrophy. 5. Mild mucosal thickening within the bilateral ethmoid and right sphenoid sinuses. 6. Small-volume fluid within the right mastoid air cells.     Electronically Signed   By: Jackey Loge D.O.   On: 11/11/2021 17:53    COGNITION: Overall cognitive status: Impaired   SENSATION: Light touch: Impaired   COORDINATION: Some difficulty sequencing   POSTURE: forward head   LOWER EXTREMITY MMT:    MMT Right Eval Left Eval  Hip flexion 3+ 4  Hip extension 4 4  Hip abduction 4 4  Hip adduction 4 4  Hip internal rotation 4 4   Hip external rotation 4 4  Knee flexion 4 4  Knee extension 4 4  Ankle dorsiflexion    Ankle plantarflexion    Ankle inversion    Ankle eversion    (Blank rows = not tested)    TRANSFERS: Assistive device utilized: None  Sit to stand: Complete Independence Stand to sit:  Complete Independence Chair to chair: Complete Independence Floor:  Not tested  GAIT: Gait pattern: step through pattern, decreased arm swing- Right, decreased arm swing- Left, decreased step length- Right, and decreased step length- Left Distance walked: 100+ feet Assistive device utilized: None Level of assistance: SBA Comments: Shuffling gait  FUNCTIONAL TESTS:  5 times sit to stand: 18.48 sec without UE Support  Timed up and go (TUG): 16.42 sec without UE support 6 minute walk test: To be tested 2nd visit 10 meter walk test: 14.84 and 14.62 sec  = 0.69 m/s avg without UE support Berg Balance Scale: 43/56  PATIENT SURVEYS:  FOTO 49 with goal of 49  TODAY'S TREATMENT:                                                                                                                              DATE: 07/14/23    Gait training  Gait with upright 4WW - 180 turning, applying brakes, sitting and sit to stand. Approx 3 laps then brief rest break prior to performing 6 MWT  TA: Patient performed 6 min walk test using upright 4WW.  Gait speed measured at 0.74m/s  avg today  *observed more erect posture with use of device and no LOB throughout walk.  NMR: (in // bars with use of gait belt and CGA)   Step tap onto 6" block (initially with BUE support then progressed to no UE support) x 20 reps alt LE   Step up 6" block x 10 reps onto 6" block.  Step tap onto 12" block (VC for slow and steady) x 20 reps  Hip swings/circles Standing- LE swing CW and then CCW  2 sets x 10 reps each LE   In // bars- step over  1/2 foam roll x 2 then up onto 6" block (all in // bars) then back x 8 times.             PATIENT EDUCATION: Education details: Purpose of PT and plan of care Person educated: Patient Education method: Explanation Education comprehension: verbalized understanding  HOME EXERCISE PROGRAM:   GOALS: Goals reviewed with patient? Yes  SHORT TERM GOALS: Target date: 04/05/2023  Pt will be independent with HEP in order to improve strength and balance in order to decrease fall risk and improve function at home and work.   Baseline: EVAL- No formal HEP In place; 04/11/2023- patient reports walking daily and attempting not to shuffle and going to [place] steady. 06/22/2023- Patient reports inconsistent with HEP due to [facility] and traveling. Goal status: ONGOING   LONG TERM GOALS: Target date: 09/14/2023  1.  Patient (> 63 years old) will complete five times sit to stand test in < 15 seconds indicating an increased LE strength and improved balance. Baseline: EVAL= 18.48 sec without UE support; 04/11/2023= 14.94 sec without UE support. 06/22/2023= 16.54 sec without UE support Goal status: PROGRESSING  2.  Patient will increase FOTO score to equal  to or greater than  59   to demonstrate statistically significant improvement in mobility and quality of life.  Baseline: EVAL=49; 04/11/2023= 59; 06/22/2023=64 Goal status: MET   3.  Patient will increase Berg Balance score by > 6 points to demonstrate decreased fall risk during functional activities. Baseline: EVAL=43/56; 06/22/2023=44/56 Goal status: ONGOING   4.  Patient will reduce timed up and go to <11 seconds to reduce fall risk and demonstrate improved transfer/gait ability. Baseline: EVAL=16.42; 04/11/2023= 10 sec without UE support Goal status: MET  5.  Patient will increase 10 meter walk test to >1.46m/s as to improve gait speed for better community ambulation and to reduce fall risk. Baseline: EVAL= 0.69; 04/11/2023= 1.0 m/s without AD. 06/22/2023=0.82 m/s Goal status: Ongoing  6. Patient will increase six minute walk test  distance to >1000 for progression to community ambulator and improve gait ability Baseline:03/08/23: 575 ft with progressive shuffling and flexion posture throughout. 04/11/2023= 925 feet with some festinating gait- worse with increased distance.  07/13/2023= 1005 feet    Goal status: PROGRESSING  7.   Patient will reduce timed up and go to <10 seconds to reduce fall risk and demonstrate improved transfer/gait ability. Baseline:  06/22/2023= 13.41 sec  Goal status: NEW   ASSESSMENT:  CLINICAL IMPRESSION: Patient again practiced with use of 4WW due to his continuation of arriving to PT clinic - shuffling with poor ability to control. He can perform all activities to improve his step length, yet when he ambulates he seems to revert back to shuffle which worsens with fatigue and increased forward lean. He used the upright walker today and presents with much improved gait sequencing and improved quality and quantity of step length. He was able to stand more erect and decrease his risk of falling. He presents with improved gait speed despite use of device with walking. At this time would recommend he obtain this walker for all safe mobility. Patient in agreement and plans to speak to [place] about this walker as an option.  Pt will continue to benefit from skilled physical therapy intervention to address impairments, improve QOL, and attain therapy goals.    OBJECTIVE IMPAIRMENTS: Abnormal gait, decreased activity tolerance, decreased balance, decreased cognition, decreased coordination, decreased endurance, decreased mobility, difficulty walking, decreased ROM, decreased strength, hypomobility, and postural dysfunction.   ACTIVITY LIMITATIONS: carrying, lifting, bending, standing, squatting, and stairs  PARTICIPATION LIMITATIONS: cleaning, shopping, community activity, and yard work  PERSONAL FACTORS: Age and 3+ comorbidities: HTN, Vertigo, arthritis  are also affecting patient's functional outcome.    REHAB POTENTIAL: Good  CLINICAL DECISION MAKING: Stable/uncomplicated  EVALUATION COMPLEXITY: Moderate  PLAN:  PT FREQUENCY: 1-2x/week  PT DURATION: 12 weeks  PLANNED INTERVENTIONS: Therapeutic exercises, Therapeutic activity, Neuromuscular re-education, Balance training, Gait training, Patient/Family education, Self Care, Joint mobilization, Joint manipulation, Stair training, Vestibular training, Canalith repositioning, Orthotic/Fit training, DME instructions, Dry Needling, Spinal manipulation, Spinal mobilization, Cryotherapy, Moist heat, Taping, Manual therapy, and Re-evaluation  PLAN FOR NEXT SESSION:    Progress balance and therex - add to HEP as appropriate.     Louis Meckel, PT  07/14/2023, 8:42 AM

## 2023-07-18 ENCOUNTER — Ambulatory Visit: Payer: Medicare Other

## 2023-07-20 ENCOUNTER — Ambulatory Visit: Payer: Medicare Other

## 2023-07-20 DIAGNOSIS — M6281 Muscle weakness (generalized): Secondary | ICD-10-CM

## 2023-07-20 DIAGNOSIS — R269 Unspecified abnormalities of gait and mobility: Secondary | ICD-10-CM | POA: Diagnosis not present

## 2023-07-20 DIAGNOSIS — R262 Difficulty in walking, not elsewhere classified: Secondary | ICD-10-CM

## 2023-07-20 DIAGNOSIS — G20B1 Parkinson's disease with dyskinesia, without mention of fluctuations: Secondary | ICD-10-CM

## 2023-07-20 DIAGNOSIS — R278 Other lack of coordination: Secondary | ICD-10-CM

## 2023-07-20 DIAGNOSIS — R2681 Unsteadiness on feet: Secondary | ICD-10-CM

## 2023-07-20 DIAGNOSIS — R2689 Other abnormalities of gait and mobility: Secondary | ICD-10-CM

## 2023-07-20 NOTE — Therapy (Signed)
OUTPATIENT PHYSICAL THERAPY NEURO TREATMENT    Patient Name: Carlos Mathis MRN: [id number] DOB:08-05-1940, 83 y.o., male Today's Date: 07/20/2023   PCP: Dr. Einar Crow REFERRING PROVIDER: Dr. Cristopher Peru  END OF SESSION:  PT End of Session - 07/20/23 1536     Visit Number 25    Number of Visits 44    Date for PT Re-Evaluation 09/14/23    Progress Note Due on Visit 30    PT Start Time 1530    PT Stop Time 1605    PT Time Calculation (min) 35 min    Equipment Utilized During Treatment Gait belt    Activity Tolerance Patient tolerated treatment well    Behavior During Therapy WFL for tasks assessed/performed                     Past Medical History:  Diagnosis Date   Arthritis    BPH (benign prostatic hyperplasia)    CKD (chronic kidney disease), stage III (HCC)    Coronary artery disease    GERD (gastroesophageal reflux disease)    History of hiatal hernia    HLD (hyperlipidemia)    Hypertension    Long term current use of antithrombotics/antiplatelets    a.) DAPT therapy (ASA+ ticagrelor)   MCI (mild cognitive impairment) with memory loss    a.) takes apoaequorin   Migraines    OSA on CPAP    Pneumonia    Skin cancer, basal cell    STEMI (ST elevation myocardial infarction) (HCC) 07/27/2020   a.) MI while living in [place]; underwent PCI placing 4.0 x 18 mm and 3.5 x 38 mm Resolute Onyx DES (vessels unknown/unspecified).   T2DM (type 2 diabetes mellitus) (HCC)    Vertigo    Wears dentures    Full upper, partial lower   Wears hearing aid in both ears    Past Surgical History:  Procedure Laterality Date   CARDIAC CATHETERIZATION     CATARACT EXTRACTION Right    CATARACT EXTRACTION W/PHACO Left 05/03/2022   Procedure: CATARACT EXTRACTION PHACO AND INTRAOCULAR LENS PLACEMENT (IOC) LEFT DIABETIC;  Surgeon: Galen Manila, MD;  Location: [facility];  Service: Ophthalmology;  Laterality: Left;  14.77 1:24.0    ESOPHAGOGASTRODUODENOSCOPY (EGD) WITH PROPOFOL N/A 03/21/2023   Procedure: ESOPHAGOGASTRODUODENOSCOPY (EGD) WITH PROPOFOL;  Surgeon: [name], MD;  Location: [facility];  Service: Gastroenterology;  Laterality: N/A;   HERNIA REPAIR Left    inguinal   INSERTION OF MESH  09/01/2021   Procedure: INSERTION OF MESH;  Surgeon: Carolan Shiver, MD;  Location: [facility];  Service: General;;   JOINT REPLACEMENT     tka   UMBILICAL HERNIA REPAIR     Patient Active Problem List   Diagnosis Date Noted   Acute myocardial infarction, unspecified (HCC) 11/16/2022   Anxiety state 11/16/2022   Balanitis 11/16/2022   Benign paroxysmal positional vertigo 11/16/2022   Cardiac dysrhythmia 11/16/2022   Chronic ischemic heart disease 11/16/2022   Chronic sinusitis 11/16/2022   Gastroesophageal reflux disease 11/16/2022   Headache 11/16/2022   Counseling, unspecified 11/16/2022   History of occlusive disease of artery of lower extremity 11/16/2022   Hypertensive chronic kidney disease with stage 1 through stage 4 chronic kidney disease, or unspecified chronic kidney disease 11/16/2022   Irritable bowel syndrome 11/16/2022   Unspecified dementia, mild, without behavioral disturbance, psychotic disturbance, mood disturbance, and anxiety (HCC) 11/16/2022   Obstructive sleep apnea (adult) (pediatric) 11/16/2022   Osteoarthritis of knee 11/16/2022  Personal history of tuberculosis 11/16/2022   Phimosis 11/16/2022   Restless legs 11/16/2022   Sensorineural hearing loss, bilateral 11/16/2022   Type 2 diabetes mellitus without complications (HCC) 11/16/2022   Diabetic polyneuropathy associated with type 2 diabetes mellitus (HCC) 06/24/2021   Healthcare maintenance 06/24/2021   CAD (coronary artery disease) 09/02/2020   Pure hypercholesterolemia 09/02/2020   Polycystic kidney 02/11/2020   Benign prostatic hyperplasia 02/11/2020   Essential hypertension 02/11/2020   Acquired cyst of kidney  07/09/2019   Spinal stenosis of lumbar region 05/30/2019   Cervical spondylosis 03/12/2019   Dysphagia 03/26/2015   Hypermetropia 02/04/2015   Presbyopia 02/04/2015   Senile nuclear cataract 02/04/2015   Epistaxis 09/23/2014   Laryngopharyngeal reflux (LPR) 09/23/2014    ONSET DATE: worse since Jan 2024  REFERRING DIAG:  Diagnosis  G20.A1 (ICD-10-CM) - Parkinson's disease    THERAPY DIAG:  Abnormality of gait and mobility  Unsteadiness on feet  Other abnormalities of gait and mobility  Difficulty in walking, not elsewhere classified  Other lack of coordination  Muscle weakness (generalized)  Parkinson's disease with dyskinesia, unspecified whether manifestations fluctuate (HCC)  Rationale for Evaluation and Treatment: Rehabilitation  SUBJECTIVE:                                                                                                                                                                                             SUBJECTIVE STATEMENT: Patient reports having a headache today and [facility]   Pt accompanied by: self  PERTINENT HISTORY:   Per neurology note submitted in chart by Janice Coffin, PA.  1. 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 Memory loss starting 2018 with cognitive decline over 2022. Patient reports difficulty remembering conversations, difficulty recognizing old friends, getting lost while driving. Per spouse patient can become aggressive behind the wheel. Patient reports visual hallucinations (dark areas out of his periphery) and audio hallucinations. Wife assists with medications and finances. Has become more emotional over the last year and sometimes becomes angry with spouse which is unusual. Denies alcohol use or difficulty sleeping. Per  wife significant procrastiantion, low motivation, more hunched forward posture, balance issues, and gets going walking and has a difficult time stopping.   Patient was cleared to drive per driving assessment, however, we discussed that memory may not keep up with the physical ability to drive if condition continues to get worse   Ambulatory Referral to Physical Therapy [facility])   2. Peripheral neuropathy- decreased sensation  with some pain in the feet in a patient with history of agent orange exposure.  3. Orthostatic Lightheadedness/Vertigo, chronic episodic vertigo  Orthostatic hypotension (neurogenic) can be seen in many conditions such as parkinsonism, diabetic neuropathy, pure autonomic failure etc. Patient should have work up to rule out other causes first such as dehydration, cardiac issues, iatrogenic due to meds etc. Lightheadedness, fainting, fatigue, blurry vision, weakness etc can happen as symptoms. Patient should drink plenty of water, wear compression stockings, exercise, liberal salts, elevate the head end of the bed etc. Fludrocortisone, Midodrine, droxidopa etc. Can be tried in certain patients.   PAIN:  Are you having pain? No  PRECAUTIONS: Fall  RED FLAGS: None   WEIGHT BEARING RESTRICTIONS: No  FALLS: Has patient fallen in last 6 months? No  LIVING ENVIRONMENT: Lives with: Wife Lives in: House/apartment Stairs: Yes: External: 5 steps; can reach both Has following equipment at home: Single point cane and Walker - 2 wheeled  PLOF: Independent  PATIENT GOALS: I want to be stronger and more steady  OBJECTIVE:   DIAGNOSTIC FINDINGS: CLINICAL DATA:  Provided history: Mild cognitive impairment with memory loss.   EXAM: MRI HEAD WITHOUT CONTRAST   TECHNIQUE: Multiplanar, multiecho pulse sequences of the brain and surrounding structures were obtained without intravenous contrast.   COMPARISON:  Head CT 07/31/2021.   FINDINGS: Brain:    Mild-to-moderate generalized cerebral atrophy. Commensurate prominence of the ventricles and sulci. Comparatively mild cerebellar atrophy.   Multifocal T2 FLAIR hyperintense signal abnormality within the cerebral white matter and pons, nonspecific but compatible with moderate chronic small vessel ischemic disease.   Mild chronic small vessel ischemic changes within the thalami.   There is no acute infarct.   No evidence of an intracranial mass.   No chronic intracranial blood products.   No extra-axial fluid collection.   No midline shift.   Vascular: Maintained flow voids within the proximal large arterial vessels.   Skull and upper cervical spine: No focal suspicious marrow lesion. Degenerative changes and ligamentous hypertrophy at the C1-C2 articulation.   Sinuses/Orbits: No mass or acute finding within the imaged orbits. Prior right ocular lens replacement. Mild mucosal thickening within the bilateral ethmoid and right sphenoid sinuses.   Other: Small-volume fluid within the right mastoid air cells.   IMPRESSION: 1. No evidence of acute intracranial abnormality. 2. Moderate chronic small vessel ischemic changes within the cerebral white matter and pons. 3. Mild chronic small-vessel ischemic changes within the thalami. 4. Mild-to-moderate generalized cerebral atrophy. Comparatively mild cerebellar atrophy. 5. Mild mucosal thickening within the bilateral ethmoid and right sphenoid sinuses. 6. Small-volume fluid within the right mastoid air cells.     Electronically Signed   By: Jackey Loge D.O.   On: 11/11/2021 17:53    COGNITION: Overall cognitive status: Impaired   SENSATION: Light touch: Impaired   COORDINATION: Some difficulty sequencing   POSTURE: forward head   LOWER EXTREMITY MMT:    MMT Right Eval Left Eval  Hip flexion 3+ 4  Hip extension 4 4  Hip abduction 4 4  Hip adduction 4 4  Hip internal rotation 4 4  Hip external rotation  4 4  Knee flexion 4 4  Knee extension 4 4  Ankle dorsiflexion    Ankle plantarflexion    Ankle inversion    Ankle eversion    (Blank rows = not tested)    TRANSFERS: Assistive device utilized: None  Sit to stand: Complete Independence Stand to sit: Complete Independence Chair to  chair: Complete Independence Floor:  Not tested  GAIT: Gait pattern: step through pattern, decreased arm swing- Right, decreased arm swing- Left, decreased step length- Right, and decreased step length- Left Distance walked: 100+ feet Assistive device utilized: None Level of assistance: SBA Comments: Shuffling gait  FUNCTIONAL TESTS:  5 times sit to stand: 18.48 sec without UE Support  Timed up and go (TUG): 16.42 sec without UE support 6 minute walk test: To be tested 2nd visit 10 meter walk test: 14.84 and 14.62 sec  = 0.69 m/s avg without UE support Berg Balance Scale: 43/56  PATIENT SURVEYS:  FOTO 49 with goal of 20  TODAY'S TREATMENT:                                                                                                                              DATE: 07/20/23       TA:  Sit to stand without UE support - Focusing on ant weight shift- x 12 reps - Patient reported as Medium Sit to stand while holding onto 9lb medicine ball then throracic rotation L then R x 12 reps.   NMR: (in // bars with use of gait belt and CGA)   -Static stand on airex pad with Dynamic  Horizontal head turning- L To R and back x 15 reps. - Static stand on airex pad with Dynamic Vertical head motions- Up/down  15 reps.  - Static stand on airex pad with eyes closed (close CGA) with some unsteadiness  - Dynamic high knee hip march- no UE support x 20 reps each LE (VC to slow down and for more emphasis on balance)         BP checked as patient was complaining of HA worsening after performing sit to stand activity-  -138/67 mmHg Left UE  Then rested 5  min and improved to 109/69 mmHg- but still  having HA so terminated session and walked patient to elevator. He reported feeling some better but going home to rest after a long day.        PATIENT EDUCATION: Education details: Purpose of PT and plan of care Person educated: Patient Education method: Explanation Education comprehension: verbalized understanding  HOME EXERCISE PROGRAM:   GOALS: Goals reviewed with patient? Yes  SHORT TERM GOALS: Target date: 04/05/2023  Pt will be independent with HEP in order to improve strength and balance in order to decrease fall risk and improve function at home and work.   Baseline: EVAL- No formal HEP In place; 04/11/2023- patient reports walking daily and attempting not to shuffle and going to [place] steady. 06/22/2023- Patient reports inconsistent with HEP due to [facility] and traveling. Goal status: ONGOING   LONG TERM GOALS: Target date: 09/14/2023  1.  Patient (> 25 years old) will complete five times sit to stand test in < 15 seconds indicating an increased LE strength and improved balance. Baseline: EVAL= 18.48 sec without UE support; 04/11/2023= 14.94 sec without  UE support. 06/22/2023= 16.54 sec without UE support Goal status: PROGRESSING  2.  Patient will increase FOTO score to equal to or greater than  59   to demonstrate statistically significant improvement in mobility and quality of life.  Baseline: EVAL=49; 04/11/2023= 59; 06/22/2023=64 Goal status: MET   3.  Patient will increase Berg Balance score by > 6 points to demonstrate decreased fall risk during functional activities. Baseline: EVAL=43/56; 06/22/2023=44/56 Goal status: ONGOING   4.  Patient will reduce timed up and go to <11 seconds to reduce fall risk and demonstrate improved transfer/gait ability. Baseline: EVAL=16.42; 04/11/2023= 10 sec without UE support Goal status: MET  5.  Patient will increase 10 meter walk test to >1.41m/s as to improve gait speed for better community ambulation and to reduce fall  risk. Baseline: EVAL= 0.69; 04/11/2023= 1.0 m/s without AD. 06/22/2023=0.82 m/s Goal status: Ongoing  6. Patient will increase six minute walk test distance to >1000 for progression to community ambulator and improve gait ability Baseline:03/08/23: 575 ft with progressive shuffling and flexion posture throughout. 04/11/2023= 925 feet with some festinating gait- worse with increased distance.  07/13/2023= 1005 feet    Goal status: PROGRESSING  7.   Patient will reduce timed up and go to <10 seconds to reduce fall risk and demonstrate improved transfer/gait ability. Baseline:  06/22/2023= 13.41 sec  Goal status: NEW   ASSESSMENT:  CLINICAL IMPRESSION: Treatment limited secondary to patient experiencing headache that intensified after 2nd round of sit to stand. Prior to that he performed well- able to respond to VC to slow down with marching for improved height with lifting LE and balance. He was agreeable to stopping session a few min early to go home and rest.  Pt will continue to benefit from skilled physical therapy intervention to address impairments, improve QOL, and attain therapy goals.    OBJECTIVE IMPAIRMENTS: Abnormal gait, decreased activity tolerance, decreased balance, decreased cognition, decreased coordination, decreased endurance, decreased mobility, difficulty walking, decreased ROM, decreased strength, hypomobility, and postural dysfunction.   ACTIVITY LIMITATIONS: carrying, lifting, bending, standing, squatting, and stairs  PARTICIPATION LIMITATIONS: cleaning, shopping, community activity, and yard work  PERSONAL FACTORS: Age and 3+ comorbidities: HTN, Vertigo, arthritis  are also affecting patient's functional outcome.   REHAB POTENTIAL: Good  CLINICAL DECISION MAKING: Stable/uncomplicated  EVALUATION COMPLEXITY: Moderate  PLAN:  PT FREQUENCY: 1-2x/week  PT DURATION: 12 weeks  PLANNED INTERVENTIONS: Therapeutic exercises, Therapeutic activity, Neuromuscular  re-education, Balance training, Gait training, Patient/Family education, Self Care, Joint mobilization, Joint manipulation, Stair training, Vestibular training, Canalith repositioning, Orthotic/Fit training, DME instructions, Dry Needling, Spinal manipulation, Spinal mobilization, Cryotherapy, Moist heat, Taping, Manual therapy, and Re-evaluation  PLAN FOR NEXT SESSION:    Progress balance and therex - add to HEP as appropriate.     Louis Meckel, PT  07/20/2023, 4:40 PM

## 2023-07-25 ENCOUNTER — Ambulatory Visit: Payer: Medicare Other | Attending: Neurology

## 2023-07-25 DIAGNOSIS — R278 Other lack of coordination: Secondary | ICD-10-CM | POA: Diagnosis present

## 2023-07-25 DIAGNOSIS — M6281 Muscle weakness (generalized): Secondary | ICD-10-CM

## 2023-07-25 DIAGNOSIS — R2681 Unsteadiness on feet: Secondary | ICD-10-CM

## 2023-07-25 DIAGNOSIS — R2689 Other abnormalities of gait and mobility: Secondary | ICD-10-CM | POA: Diagnosis present

## 2023-07-25 DIAGNOSIS — G20B1 Parkinson's disease with dyskinesia, without mention of fluctuations: Secondary | ICD-10-CM | POA: Diagnosis present

## 2023-07-25 DIAGNOSIS — R262 Difficulty in walking, not elsewhere classified: Secondary | ICD-10-CM | POA: Diagnosis present

## 2023-07-25 DIAGNOSIS — R269 Unspecified abnormalities of gait and mobility: Secondary | ICD-10-CM | POA: Diagnosis present

## 2023-07-25 NOTE — Therapy (Signed)
 OUTPATIENT PHYSICAL THERAPY NEURO TREATMENT    Patient Name: Carlos Mathis MRN: [id number] DOB:1940-09-13, 83 y.o., male Today's Date: 07/25/2023   PCP: Dr. Layman Piety REFERRING PROVIDER: Dr. Jannett Fairly  END OF SESSION:  PT End of Session - 07/25/23 1024     Visit Number 26    Number of Visits 44    Date for PT Re-Evaluation 09/14/23    Progress Note Due on Visit 30    PT Start Time 1024    PT Stop Time 1100    PT Time Calculation (min) 36 min    Equipment Utilized During Treatment Gait belt    Activity Tolerance Patient tolerated treatment well;No increased pain    Behavior During Therapy WFL for tasks assessed/performed                      Past Medical History:  Diagnosis Date   Arthritis    BPH (benign prostatic hyperplasia)    CKD (chronic kidney disease), stage III (HCC)    Coronary artery disease    GERD (gastroesophageal reflux disease)    History of hiatal hernia    HLD (hyperlipidemia)    Hypertension    Long term current use of antithrombotics/antiplatelets    a.) DAPT therapy (ASA+ ticagrelor)   MCI (mild cognitive impairment) with memory loss    a.) takes apoaequorin   Migraines    OSA on CPAP    Pneumonia    Skin cancer, basal cell    STEMI (ST elevation myocardial infarction) (HCC) 07/27/2020   a.) MI while living in [place] ; underwent PCI placing 4.0 x 18 mm and 3.5 x 38 mm Resolute Onyx DES (vessels unknown/unspecified).   T2DM (type 2 diabetes mellitus) (HCC)    Vertigo    Wears dentures    Full upper, partial lower   Wears hearing aid in both ears    Past Surgical History:  Procedure Laterality Date   CARDIAC CATHETERIZATION     CATARACT EXTRACTION Right    CATARACT EXTRACTION W/PHACO Left 05/03/2022   Procedure: CATARACT EXTRACTION PHACO AND INTRAOCULAR LENS PLACEMENT (IOC) LEFT DIABETIC;  Surgeon: Jaye Fallow, MD;  Location: [facility];  Service: Ophthalmology;  Laterality: Left;  14.77 1:24.0    ESOPHAGOGASTRODUODENOSCOPY (EGD) WITH PROPOFOL  N/A 03/21/2023   Procedure: ESOPHAGOGASTRODUODENOSCOPY (EGD) WITH PROPOFOL ;  Surgeon: Therisa Bi, MD;  Location: [facility];  Service: Gastroenterology;  Laterality: N/A;   HERNIA REPAIR Left    inguinal   INSERTION OF MESH  09/01/2021   Procedure: INSERTION OF MESH;  Surgeon: Rodolph Romano, MD;  Location: [facility];  Service: General;;   JOINT REPLACEMENT     tka   UMBILICAL HERNIA REPAIR     Patient Active Problem List   Diagnosis Date Noted   Acute myocardial infarction, unspecified (HCC) 11/16/2022   Anxiety state 11/16/2022   Balanitis 11/16/2022   Benign paroxysmal positional vertigo 11/16/2022   Cardiac dysrhythmia 11/16/2022   Chronic ischemic heart disease 11/16/2022   Chronic sinusitis 11/16/2022   Gastroesophageal reflux disease 11/16/2022   Headache 11/16/2022   Counseling, unspecified 11/16/2022   History of occlusive disease of artery of lower extremity 11/16/2022   Hypertensive chronic kidney disease with stage 1 through stage 4 chronic kidney disease, or unspecified chronic kidney disease 11/16/2022   Irritable bowel syndrome 11/16/2022   Unspecified dementia, mild, without behavioral disturbance, psychotic disturbance, mood disturbance, and anxiety (HCC) 11/16/2022   Obstructive sleep apnea (adult) (pediatric) 11/16/2022   Osteoarthritis of knee  11/16/2022   Personal history of tuberculosis 11/16/2022   Phimosis 11/16/2022   Restless legs 11/16/2022   Sensorineural hearing loss, bilateral 11/16/2022   Type 2 diabetes mellitus without complications (HCC) 11/16/2022   Diabetic polyneuropathy associated with type 2 diabetes mellitus (HCC) 06/24/2021   Healthcare maintenance 06/24/2021   CAD (coronary artery disease) 09/02/2020   Pure hypercholesterolemia 09/02/2020   Polycystic kidney 02/11/2020   Benign prostatic hyperplasia 02/11/2020   Essential hypertension 02/11/2020   Acquired cyst of kidney  07/09/2019   Spinal stenosis of lumbar region 05/30/2019   Cervical spondylosis 03/12/2019   Dysphagia 03/26/2015   Hypermetropia 02/04/2015   Presbyopia 02/04/2015   Senile nuclear cataract 02/04/2015   Epistaxis 09/23/2014   Laryngopharyngeal reflux (LPR) 09/23/2014    ONSET DATE: worse since Jan 2024  REFERRING DIAG:  Diagnosis  G20.A1 (ICD-10-CM) - Parkinson's disease    THERAPY DIAG:  Muscle weakness (generalized)  Difficulty in walking, not elsewhere classified  Unsteadiness on feet  Rationale for Evaluation and Treatment: Rehabilitation  SUBJECTIVE:                                                                                                                                                                                             SUBJECTIVE STATEMENT: Pt reports some nose bleeding when he blows his nose. He reports this is normal for him but somewhat increased. He reports no headaches. Reports fall 3 weeks ago going too fast down incline but no other recent falls reported. He does report stumbling.  Pt accompanied by: self  PERTINENT HISTORY:   Per neurology note submitted in chart by Kaitlin Paich, PA.  1. 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 Memory loss starting 2018 with cognitive decline over 2022. Patient reports difficulty remembering conversations, difficulty recognizing old friends, getting lost while driving. Per spouse patient can become aggressive behind the wheel. Patient reports visual hallucinations (dark areas out of his periphery) and audio hallucinations. Wife assists with medications and finances. Has become more emotional over the last year and sometimes becomes angry with spouse which is unusual. Denies alcohol use or difficulty sleeping. Per wife significant  procrastiantion, low motivation, more hunched forward posture, balance issues, and gets going walking and has a difficult time stopping.   Patient was cleared to drive per driving assessment, however, we discussed that memory may not keep up with the physical ability to drive if condition continues to get worse   Ambulatory Referral to Physical Therapy [facility])  2. Peripheral neuropathy- decreased sensation with some pain in the feet in a patient with history of agent orange exposure.  3. Orthostatic Lightheadedness/Vertigo, chronic episodic vertigo  Orthostatic hypotension (neurogenic) can be seen in many conditions such as parkinsonism, diabetic neuropathy, pure autonomic failure etc. Patient should have work up to rule out other causes first such as dehydration, cardiac issues, iatrogenic due to meds etc. Lightheadedness, fainting, fatigue, blurry vision, weakness etc can happen as symptoms. Patient should drink plenty of water, wear compression stockings, exercise, liberal salts, elevate the head end of the bed etc. Fludrocortisone, Midodrine, droxidopa etc. Can be tried in certain patients.   PAIN:  Are you having pain? No  PRECAUTIONS: Fall  RED FLAGS: None   WEIGHT BEARING RESTRICTIONS: No  FALLS: Has patient fallen in last 6 months? No  LIVING ENVIRONMENT: Lives with: Wife Lives in: House/apartment Stairs: Yes: External: 5 steps; can reach both Has following equipment at home: Single point cane and Walker - 2 wheeled  PLOF: Independent  PATIENT GOALS: I want to be stronger and more steady  OBJECTIVE:   DIAGNOSTIC FINDINGS: CLINICAL DATA:  Provided history: Mild cognitive impairment with memory loss.   EXAM: MRI HEAD WITHOUT CONTRAST   TECHNIQUE: Multiplanar, multiecho pulse sequences of the brain and surrounding structures were obtained without intravenous contrast.   COMPARISON:  Head CT 07/31/2021.   FINDINGS: Brain:   Mild-to-moderate generalized  cerebral atrophy. Commensurate prominence of the ventricles and sulci. Comparatively mild cerebellar atrophy.   Multifocal T2 FLAIR hyperintense signal abnormality within the cerebral white matter and pons, nonspecific but compatible with moderate chronic small vessel ischemic disease.   Mild chronic small vessel ischemic changes within the thalami.   There is no acute infarct.   No evidence of an intracranial mass.   No chronic intracranial blood products.   No extra-axial fluid collection.   No midline shift.   Vascular: Maintained flow voids within the proximal large arterial vessels.   Skull and upper cervical spine: No focal suspicious marrow lesion. Degenerative changes and ligamentous hypertrophy at the C1-C2 articulation.   Sinuses/Orbits: No mass or acute finding within the imaged orbits. Prior right ocular lens replacement. Mild mucosal thickening within the bilateral ethmoid and right sphenoid sinuses.   Other: Small-volume fluid within the right mastoid air cells.   IMPRESSION: 1. No evidence of acute intracranial abnormality. 2. Moderate chronic small vessel ischemic changes within the cerebral white matter and pons. 3. Mild chronic small-vessel ischemic changes within the thalami. 4. Mild-to-moderate generalized cerebral atrophy. Comparatively mild cerebellar atrophy. 5. Mild mucosal thickening within the bilateral ethmoid and right sphenoid sinuses. 6. Small-volume fluid within the right mastoid air cells.     Electronically Signed   By: Rockey Childs D.O.   On: 11/11/2021 17:53    COGNITION: Overall cognitive status: Impaired   SENSATION: Light touch: Impaired   COORDINATION: Some difficulty sequencing   POSTURE: forward head   LOWER EXTREMITY MMT:    MMT Right Eval Left Eval  Hip flexion 3+ 4  Hip extension 4 4  Hip abduction 4 4  Hip adduction 4 4  Hip internal rotation 4 4  Hip external rotation 4 4  Knee flexion 4 4  Knee  extension 4 4  Ankle dorsiflexion    Ankle plantarflexion    Ankle inversion    Ankle eversion    (Blank rows = not tested)    TRANSFERS: Assistive device utilized: None  Sit to stand: Complete Independence Stand to  sit: Complete Independence Chair to chair: Complete Independence Floor:  Not tested  GAIT: Gait pattern: step through pattern, decreased arm swing- Right, decreased arm swing- Left, decreased step length- Right, and decreased step length- Left Distance walked: 100+ feet Assistive device utilized: None Level of assistance: SBA Comments: Shuffling gait  FUNCTIONAL TESTS:  5 times sit to stand: 18.48 sec without UE Support  Timed up and go (TUG): 16.42 sec without UE support 6 minute walk test: To be tested 2nd visit 10 meter walk test: 14.84 and 14.62 sec  = 0.69 m/s avg without UE support Berg Balance Scale: 43/56  PATIENT SURVEYS:  FOTO 49 with goal of 97  TODAY'S TREATMENT:                                                                                                                              DATE: 07/25/23     Gait training   Gait with upright 4WW practice applying brakes 1x148 ft - good use of brakes, carryover between sessions  TA:  Sit to stand without UE support - Focusing on ant weight shift- x 12 reps -  rates medium  Sit to stand with throracic rotation L then R in standing x 10 reps.     Step tap onto 6 block (initially with BUE support then progressed to no UE support) x 20 reps alt LE    Step up 6 steps x 10 reps - rates medium   Step tap onto 12 block (VC for slow and steady) x 20 reps   Hip swings/circles Standing- LE swing CW and then CCW  2 sets x 10 reps each LE  - rates medium  NMR: Balance on compliant surface- - Static stand on airex pad with eyes closed (close CGA) with some unsteadiness 3x30 sec -Airex EO WBOS x 60 sec - March on airex 2x20 each LE         PATIENT EDUCATION: Education details: Purpose of PT  and plan of care Person educated: Patient Education method: Explanation Education comprehension: verbalized understanding  HOME EXERCISE PROGRAM:   GOALS: Goals reviewed with patient? Yes  SHORT TERM GOALS: Target date: 04/05/2023  Pt will be independent with HEP in order to improve strength and balance in order to decrease fall risk and improve function at home and work.   Baseline: EVAL- No formal HEP In place; 04/11/2023- patient reports walking daily and attempting not to shuffle and going to [place] steady. 06/22/2023- Patient reports inconsistent with HEP due to [facility] and traveling. Goal status: ONGOING   LONG TERM GOALS: Target date: 09/14/2023  1.  Patient (> 26 years old) will complete five times sit to stand test in < 15 seconds indicating an increased LE strength and improved balance. Baseline: EVAL= 18.48 sec without UE support; 04/11/2023= 14.94 sec without UE support. 06/22/2023= 16.54 sec without UE support Goal status: PROGRESSING  2.  Patient will increase FOTO score to equal to  or greater than  59   to demonstrate statistically significant improvement in mobility and quality of life.  Baseline: EVAL=49; 04/11/2023= 59; 06/22/2023=64 Goal status: MET   3.  Patient will increase Berg Balance score by > 6 points to demonstrate decreased fall risk during functional activities. Baseline: EVAL=43/56; 06/22/2023=44/56 Goal status: ONGOING   4.  Patient will reduce timed up and go to <11 seconds to reduce fall risk and demonstrate improved transfer/gait ability. Baseline: EVAL=16.42; 04/11/2023= 10 sec without UE support Goal status: MET  5.  Patient will increase 10 meter walk test to >1.77m/s as to improve gait speed for better community ambulation and to reduce fall risk. Baseline: EVAL= 0.69; 04/11/2023= 1.0 m/s without AD. 06/22/2023=0.82 m/s Goal status: Ongoing  6. Patient will increase six minute walk test distance to >1000 for progression to community ambulator and  improve gait ability Baseline:03/08/23: 575 ft with progressive shuffling and flexion posture throughout. 04/11/2023= 925 feet with some festinating gait- worse with increased distance.  07/13/2023= 1005 feet    Goal status: PROGRESSING  7.   Patient will reduce timed up and go to <10 seconds to reduce fall risk and demonstrate improved transfer/gait ability. Baseline:  06/22/2023= 13.41 sec  Goal status: NEW   ASSESSMENT:  CLINICAL IMPRESSION: Pt tolerates all interventions well today without pain or unusual symptoms, no headaches. He exhibits good control of brakes with gait training upright RW. Pt somewhat challenged by march on airex. Pt will continue to benefit from skilled physical therapy intervention to address impairments, improve QOL, and attain therapy goals.    OBJECTIVE IMPAIRMENTS: Abnormal gait, decreased activity tolerance, decreased balance, decreased cognition, decreased coordination, decreased endurance, decreased mobility, difficulty walking, decreased ROM, decreased strength, hypomobility, and postural dysfunction.   ACTIVITY LIMITATIONS: carrying, lifting, bending, standing, squatting, and stairs  PARTICIPATION LIMITATIONS: cleaning, shopping, community activity, and yard work  PERSONAL FACTORS: Age and 3+ comorbidities: HTN, Vertigo, arthritis  are also affecting patient's functional outcome.   REHAB POTENTIAL: Good  CLINICAL DECISION MAKING: Stable/uncomplicated  EVALUATION COMPLEXITY: Moderate  PLAN:  PT FREQUENCY: 1-2x/week  PT DURATION: 12 weeks  PLANNED INTERVENTIONS: Therapeutic exercises, Therapeutic activity, Neuromuscular re-education, Balance training, Gait training, Patient/Family education, Self Care, Joint mobilization, Joint manipulation, Stair training, Vestibular training, Canalith repositioning, Orthotic/Fit training, DME instructions, Dry Needling, Spinal manipulation, Spinal mobilization, Cryotherapy, Moist heat, Taping, Manual therapy, and  Re-evaluation  PLAN FOR NEXT SESSION:    Progress balance and therex - add to HEP as appropriate.     Darryle Patten PT, DPT  07/25/2023, 11:57 AM

## 2023-07-27 ENCOUNTER — Ambulatory Visit: Payer: Medicare Other

## 2023-08-01 ENCOUNTER — Ambulatory Visit: Payer: Medicare Other

## 2023-08-01 DIAGNOSIS — M6281 Muscle weakness (generalized): Secondary | ICD-10-CM | POA: Diagnosis not present

## 2023-08-01 DIAGNOSIS — R2681 Unsteadiness on feet: Secondary | ICD-10-CM

## 2023-08-01 DIAGNOSIS — R262 Difficulty in walking, not elsewhere classified: Secondary | ICD-10-CM

## 2023-08-01 DIAGNOSIS — R278 Other lack of coordination: Secondary | ICD-10-CM

## 2023-08-01 DIAGNOSIS — R269 Unspecified abnormalities of gait and mobility: Secondary | ICD-10-CM

## 2023-08-01 DIAGNOSIS — R2689 Other abnormalities of gait and mobility: Secondary | ICD-10-CM

## 2023-08-01 DIAGNOSIS — G20B1 Parkinson's disease with dyskinesia, without mention of fluctuations: Secondary | ICD-10-CM

## 2023-08-01 NOTE — Therapy (Signed)
OUTPATIENT PHYSICAL THERAPY NEURO TREATMENT    Patient Name: Carlos Mathis MRN: [id number] DOB:30-Dec-1940, 83 y.o., male Today's Date: 08/01/2023   PCP: Dr. Einar Crow REFERRING PROVIDER: Dr. Cristopher Peru  END OF SESSION:  PT End of Session - 08/01/23 1041     Visit Number 27    Number of Visits 44    Date for PT Re-Evaluation 09/14/23    Progress Note Due on Visit 30    PT Start Time 1030    PT Stop Time 1100    PT Time Calculation (min) 30 min    Equipment Utilized During Treatment Gait belt    Activity Tolerance Patient tolerated treatment well;No increased pain    Behavior During Therapy WFL for tasks assessed/performed                       Past Medical History:  Diagnosis Date   Arthritis    BPH (benign prostatic hyperplasia)    CKD (chronic kidney disease), stage III (HCC)    Coronary artery disease    GERD (gastroesophageal reflux disease)    History of hiatal hernia    HLD (hyperlipidemia)    Hypertension    Long term current use of antithrombotics/antiplatelets    a.) DAPT therapy (ASA+ ticagrelor)   MCI (mild cognitive impairment) with memory loss    a.) takes apoaequorin   Migraines    OSA on CPAP    Pneumonia    Skin cancer, basal cell    STEMI (ST elevation myocardial infarction) (HCC) 07/27/2020   a.) MI while living in [place]; underwent PCI placing 4.0 x 18 mm and 3.5 x 38 mm Resolute Onyx DES (vessels unknown/unspecified).   T2DM (type 2 diabetes mellitus) (HCC)    Vertigo    Wears dentures    Full upper, partial lower   Wears hearing aid in both ears    Past Surgical History:  Procedure Laterality Date   CARDIAC CATHETERIZATION     CATARACT EXTRACTION Right    CATARACT EXTRACTION W/PHACO Left 05/03/2022   Procedure: CATARACT EXTRACTION PHACO AND INTRAOCULAR LENS PLACEMENT (IOC) LEFT DIABETIC;  Surgeon: Galen Manila, MD;  Location: [facility];  Service: Ophthalmology;  Laterality: Left;  14.77 1:24.0    ESOPHAGOGASTRODUODENOSCOPY (EGD) WITH PROPOFOL N/A 03/21/2023   Procedure: ESOPHAGOGASTRODUODENOSCOPY (EGD) WITH PROPOFOL;  Surgeon: [name], MD;  Location: [facility];  Service: Gastroenterology;  Laterality: N/A;   HERNIA REPAIR Left    inguinal   INSERTION OF MESH  09/01/2021   Procedure: INSERTION OF MESH;  Surgeon: Carolan Shiver, MD;  Location: [facility] ORS;  Service: General;;   JOINT REPLACEMENT     tka   UMBILICAL HERNIA REPAIR     Patient Active Problem List   Diagnosis Date Noted   Acute myocardial infarction, unspecified (HCC) 11/16/2022   Anxiety state 11/16/2022   Balanitis 11/16/2022   Benign paroxysmal positional vertigo 11/16/2022   Cardiac dysrhythmia 11/16/2022   Chronic ischemic heart disease 11/16/2022   Chronic sinusitis 11/16/2022   Gastroesophageal reflux disease 11/16/2022   Headache 11/16/2022   Counseling, unspecified 11/16/2022   History of occlusive disease of artery of lower extremity 11/16/2022   Hypertensive chronic kidney disease with stage 1 through stage 4 chronic kidney disease, or unspecified chronic kidney disease 11/16/2022   Irritable bowel syndrome 11/16/2022   Unspecified dementia, mild, without behavioral disturbance, psychotic disturbance, mood disturbance, and anxiety (HCC) 11/16/2022   Obstructive sleep apnea (adult) (pediatric) 11/16/2022   Osteoarthritis of  knee 11/16/2022   Personal history of tuberculosis 11/16/2022   Phimosis 11/16/2022   Restless legs 11/16/2022   Sensorineural hearing loss, bilateral 11/16/2022   Type 2 diabetes mellitus without complications (HCC) 11/16/2022   Diabetic polyneuropathy associated with type 2 diabetes mellitus (HCC) 06/24/2021   Healthcare maintenance 06/24/2021   CAD (coronary artery disease) 09/02/2020   Pure hypercholesterolemia 09/02/2020   Polycystic kidney 02/11/2020   Benign prostatic hyperplasia 02/11/2020   Essential hypertension 02/11/2020   Acquired cyst of kidney  07/09/2019   Spinal stenosis of lumbar region 05/30/2019   Cervical spondylosis 03/12/2019   Dysphagia 03/26/2015   Hypermetropia 02/04/2015   Presbyopia 02/04/2015   Senile nuclear cataract 02/04/2015   Epistaxis 09/23/2014   Laryngopharyngeal reflux (LPR) 09/23/2014    ONSET DATE: worse since Jan 2024  REFERRING DIAG:  Diagnosis  G20.A1 (ICD-10-CM) - Parkinson's disease    THERAPY DIAG:  Muscle weakness (generalized)  Difficulty in walking, not elsewhere classified  Unsteadiness on feet  Abnormality of gait and mobility  Other abnormalities of gait and mobility  Other lack of coordination  Parkinson's disease with dyskinesia, unspecified whether manifestations fluctuate (HCC)  Rationale for Evaluation and Treatment: Rehabilitation  SUBJECTIVE:                                                                                                                                                                                             SUBJECTIVE STATEMENT: Patient reports he fell coming into PT clinic waiting room- Rushing in cause I was late and just fell forward. He reports his finger was caught hinge and he fell on his bottom. He denies any injury or need for medical assistance.    Pt accompanied by: self  PERTINENT HISTORY:   Per neurology note submitted in chart by Janice Coffin, PA.  1. 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 Memory loss starting 2018 with cognitive decline over 2022. Patient reports difficulty remembering conversations, difficulty recognizing old friends, getting lost while driving. Per spouse patient can become aggressive behind the wheel. Patient reports visual hallucinations (dark areas out of his periphery) and audio hallucinations. Wife assists with medications and  finances. Has become more emotional over the last year and sometimes becomes angry with spouse which is unusual. Denies alcohol use or difficulty sleeping. Per wife significant procrastiantion, low motivation, more hunched forward posture, balance issues, and gets going walking and has a difficult time stopping.   Patient was cleared to drive per driving assessment, however, we discussed  that memory may not keep up with the physical ability to drive if condition continues to get worse   Ambulatory Referral to Physical Therapy [facility])   2. Peripheral neuropathy- decreased sensation with some pain in the feet in a patient with history of agent orange exposure.  3. Orthostatic Lightheadedness/Vertigo, chronic episodic vertigo  Orthostatic hypotension (neurogenic) can be seen in many conditions such as parkinsonism, diabetic neuropathy, pure autonomic failure etc. Patient should have work up to rule out other causes first such as dehydration, cardiac issues, iatrogenic due to meds etc. Lightheadedness, fainting, fatigue, blurry vision, weakness etc can happen as symptoms. Patient should drink plenty of water, wear compression stockings, exercise, liberal salts, elevate the head end of the bed etc. Fludrocortisone, Midodrine, droxidopa etc. Can be tried in certain patients.   PAIN:  Are you having pain? No  PRECAUTIONS: Fall  RED FLAGS: None   WEIGHT BEARING RESTRICTIONS: No  FALLS: Has patient fallen in last 6 months? No  LIVING ENVIRONMENT: Lives with: Wife Lives in: House/apartment Stairs: Yes: External: 5 steps; can reach both Has following equipment at home: Single point cane and Walker - 2 wheeled  PLOF: Independent  PATIENT GOALS: I want to be stronger and more steady  OBJECTIVE:   DIAGNOSTIC FINDINGS: CLINICAL DATA:  Provided history: Mild cognitive impairment with memory loss.   EXAM: MRI HEAD WITHOUT CONTRAST   TECHNIQUE: Multiplanar, multiecho pulse sequences of  the brain and surrounding structures were obtained without intravenous contrast.   COMPARISON:  Head CT 07/31/2021.   FINDINGS: Brain:   Mild-to-moderate generalized cerebral atrophy. Commensurate prominence of the ventricles and sulci. Comparatively mild cerebellar atrophy.   Multifocal T2 FLAIR hyperintense signal abnormality within the cerebral white matter and pons, nonspecific but compatible with moderate chronic small vessel ischemic disease.   Mild chronic small vessel ischemic changes within the thalami.   There is no acute infarct.   No evidence of an intracranial mass.   No chronic intracranial blood products.   No extra-axial fluid collection.   No midline shift.   Vascular: Maintained flow voids within the proximal large arterial vessels.   Skull and upper cervical spine: No focal suspicious marrow lesion. Degenerative changes and ligamentous hypertrophy at the C1-C2 articulation.   Sinuses/Orbits: No mass or acute finding within the imaged orbits. Prior right ocular lens replacement. Mild mucosal thickening within the bilateral ethmoid and right sphenoid sinuses.   Other: Small-volume fluid within the right mastoid air cells.   IMPRESSION: 1. No evidence of acute intracranial abnormality. 2. Moderate chronic small vessel ischemic changes within the cerebral white matter and pons. 3. Mild chronic small-vessel ischemic changes within the thalami. 4. Mild-to-moderate generalized cerebral atrophy. Comparatively mild cerebellar atrophy. 5. Mild mucosal thickening within the bilateral ethmoid and right sphenoid sinuses. 6. Small-volume fluid within the right mastoid air cells.     Electronically Signed   By: Jackey Loge D.O.   On: 11/11/2021 17:53    COGNITION: Overall cognitive status: Impaired   SENSATION: Light touch: Impaired   COORDINATION: Some difficulty sequencing   POSTURE: forward head   LOWER EXTREMITY MMT:    MMT Right Eval  Left Eval  Hip flexion 3+ 4  Hip extension 4 4  Hip abduction 4 4  Hip adduction 4 4  Hip internal rotation 4 4  Hip external rotation 4 4  Knee flexion 4 4  Knee extension 4 4  Ankle dorsiflexion    Ankle plantarflexion    Ankle  inversion    Ankle eversion    (Blank rows = not tested)    TRANSFERS: Assistive device utilized: None  Sit to stand: Complete Independence Stand to sit: Complete Independence Chair to chair: Complete Independence Floor:  Not tested  GAIT: Gait pattern: step through pattern, decreased arm swing- Right, decreased arm swing- Left, decreased step length- Right, and decreased step length- Left Distance walked: 100+ feet Assistive device utilized: None Level of assistance: SBA Comments: Shuffling gait  FUNCTIONAL TESTS:  5 times sit to stand: 18.48 sec without UE Support  Timed up and go (TUG): 16.42 sec without UE support 6 minute walk test: To be tested 2nd visit 10 meter walk test: 14.84 and 14.62 sec  = 0.69 m/s avg without UE support Berg Balance Scale: 43/56  PATIENT SURVEYS:  FOTO 49 with goal of 59  TODAY'S TREATMENT:                                                                                                                              DATE: 08/01/23     Quick assessment as patient fell. BP= 137/85 mmHg Left UE; HR=128 bpm Assessed again in clinic after about 5 min- 129/81 mmHg and HR=110 bpm.  Gait training:    Gait with upright 4WW practice walking in hospital- adjusted height of armrest- approx 200 feet exhibiting improved posture and only VC to clear his feet.   TA:  Floor to chair transfer - Since patient was lying on floor upon arrival (fell in waiting room) - reviewed floor to chair transfer- Patient able to transition from longsit to rolling over to quadraped then crawl toward chair- using armrest able to pull up into 1/2 kneel then up to his feet.- all with VC and CGA   Seated hip march-5# 2 sets of 10 reps to focus  on improving step height with walking.   Seated knee ext 5# - 2 sets of 10 reps  Self care- home management:  Reiterated recommendation of using upright 4WW for all community walking in light of most recent fall. Emphasized that the walker would help prevent with excessive forward lean and give him some support.        PATIENT EDUCATION: Education details: Purpose of PT and plan of care Person educated: Patient Education method: Explanation Education comprehension: verbalized understanding  HOME EXERCISE PROGRAM:   GOALS: Goals reviewed with patient? Yes  SHORT TERM GOALS: Target date: 04/05/2023  Pt will be independent with HEP in order to improve strength and balance in order to decrease fall risk and improve function at home and work.   Baseline: EVAL- No formal HEP In place; 04/11/2023- patient reports walking daily and attempting not to shuffle and going to [place] steady. 06/22/2023- Patient reports inconsistent with HEP due to [facility] and traveling. Goal status: ONGOING   LONG TERM GOALS: Target date: 09/14/2023  1.  Patient (> 48 years old) will complete five times sit to stand  test in < 15 seconds indicating an increased LE strength and improved balance. Baseline: EVAL= 18.48 sec without UE support; 04/11/2023= 14.94 sec without UE support. 06/22/2023= 16.54 sec without UE support Goal status: PROGRESSING  2.  Patient will increase FOTO score to equal to or greater than  59   to demonstrate statistically significant improvement in mobility and quality of life.  Baseline: EVAL=49; 04/11/2023= 59; 06/22/2023=64 Goal status: MET   3.  Patient will increase Berg Balance score by > 6 points to demonstrate decreased fall risk during functional activities. Baseline: EVAL=43/56; 06/22/2023=44/56 Goal status: ONGOING   4.  Patient will reduce timed up and go to <11 seconds to reduce fall risk and demonstrate improved transfer/gait ability. Baseline: EVAL=16.42; 04/11/2023= 10 sec  without UE support Goal status: MET  5.  Patient will increase 10 meter walk test to >1.67m/s as to improve gait speed for better community ambulation and to reduce fall risk. Baseline: EVAL= 0.69; 04/11/2023= 1.0 m/s without AD. 06/22/2023=0.82 m/s Goal status: Ongoing  6. Patient will increase six minute walk test distance to >1000 for progression to community ambulator and improve gait ability Baseline:03/08/23: 575 ft with progressive shuffling and flexion posture throughout. 04/11/2023= 925 feet with some festinating gait- worse with increased distance.  07/13/2023= 1005 feet    Goal status: PROGRESSING  7.   Patient will reduce timed up and go to <10 seconds to reduce fall risk and demonstrate improved transfer/gait ability. Baseline:  06/22/2023= 13.41 sec  Goal status: NEW   ASSESSMENT:  CLINICAL IMPRESSION: Treatment limited secondary to patient arriving late to appointment and falling upon entering clinic. He did have some bruising with left ring finger yet no other injuries. Emphasized importance of recommendation of acquiring a upright 4WW to maximize his safety with mobility. He is at increased risk of falling and has reported 2 falls in the past 2 months. He is able to use the walker well today without any issues and expresses confidence in walker. Pt will continue to benefit from skilled physical therapy intervention to address impairments, improve QOL, and attain therapy goals.    OBJECTIVE IMPAIRMENTS: Abnormal gait, decreased activity tolerance, decreased balance, decreased cognition, decreased coordination, decreased endurance, decreased mobility, difficulty walking, decreased ROM, decreased strength, hypomobility, and postural dysfunction.   ACTIVITY LIMITATIONS: carrying, lifting, bending, standing, squatting, and stairs  PARTICIPATION LIMITATIONS: cleaning, shopping, community activity, and yard work  PERSONAL FACTORS: Age and 3+ comorbidities: HTN, Vertigo, arthritis   are also affecting patient's functional outcome.   REHAB POTENTIAL: Good  CLINICAL DECISION MAKING: Stable/uncomplicated  EVALUATION COMPLEXITY: Moderate  PLAN:  PT FREQUENCY: 1-2x/week  PT DURATION: 12 weeks  PLANNED INTERVENTIONS: Therapeutic exercises, Therapeutic activity, Neuromuscular re-education, Balance training, Gait training, Patient/Family education, Self Care, Joint mobilization, Joint manipulation, Stair training, Vestibular training, Canalith repositioning, Orthotic/Fit training, DME instructions, Dry Needling, Spinal manipulation, Spinal mobilization, Cryotherapy, Moist heat, Taping, Manual therapy, and Re-evaluation  PLAN FOR NEXT SESSION:    Progress balance and LE strengthening - add to HEP as appropriate. Continue with safety training with all mobility.      Louis Meckel, PT  08/01/2023, 12:27 PM

## 2023-08-03 ENCOUNTER — Ambulatory Visit: Payer: Medicare Other

## 2023-08-03 DIAGNOSIS — R2689 Other abnormalities of gait and mobility: Secondary | ICD-10-CM

## 2023-08-03 DIAGNOSIS — G20B1 Parkinson's disease with dyskinesia, without mention of fluctuations: Secondary | ICD-10-CM

## 2023-08-03 DIAGNOSIS — M6281 Muscle weakness (generalized): Secondary | ICD-10-CM

## 2023-08-03 DIAGNOSIS — R269 Unspecified abnormalities of gait and mobility: Secondary | ICD-10-CM

## 2023-08-03 DIAGNOSIS — R278 Other lack of coordination: Secondary | ICD-10-CM

## 2023-08-03 DIAGNOSIS — R2681 Unsteadiness on feet: Secondary | ICD-10-CM

## 2023-08-03 DIAGNOSIS — R262 Difficulty in walking, not elsewhere classified: Secondary | ICD-10-CM

## 2023-08-03 NOTE — Therapy (Signed)
OUTPATIENT PHYSICAL THERAPY NEURO TREATMENT    Patient Name: Carlos Mathis MRN: [id number] DOB:Jul 13, 1940, 83 y.o., male Today's Date: 08/04/2023   PCP: Dr. Einar Crow REFERRING PROVIDER: Dr. Cristopher Peru  END OF SESSION:  PT End of Session - 08/03/23 1153     Visit Number 28    Number of Visits 44    Date for PT Re-Evaluation 09/14/23    Progress Note Due on Visit 30    PT Start Time 1148    PT Stop Time 1229    PT Time Calculation (min) 41 min    Equipment Utilized During Treatment Gait belt    Activity Tolerance Patient tolerated treatment well;No increased pain    Behavior During Therapy WFL for tasks assessed/performed                       Past Medical History:  Diagnosis Date   Arthritis    BPH (benign prostatic hyperplasia)    CKD (chronic kidney disease), stage III (HCC)    Coronary artery disease    GERD (gastroesophageal reflux disease)    History of hiatal hernia    HLD (hyperlipidemia)    Hypertension    Long term current use of antithrombotics/antiplatelets    a.) DAPT therapy (ASA+ ticagrelor)   MCI (mild cognitive impairment) with memory loss    a.) takes apoaequorin   Migraines    OSA on CPAP    Pneumonia    Skin cancer, basal cell    STEMI (ST elevation myocardial infarction) (HCC) 07/27/2020   a.) MI while living in [place]; underwent PCI placing 4.0 x 18 mm and 3.5 x 38 mm Resolute Onyx DES (vessels unknown/unspecified).   T2DM (type 2 diabetes mellitus) (HCC)    Vertigo    Wears dentures    Full upper, partial lower   Wears hearing aid in both ears    Past Surgical History:  Procedure Laterality Date   CARDIAC CATHETERIZATION     CATARACT EXTRACTION Right    CATARACT EXTRACTION W/PHACO Left 05/03/2022   Procedure: CATARACT EXTRACTION PHACO AND INTRAOCULAR LENS PLACEMENT (IOC) LEFT DIABETIC;  Surgeon: Galen Manila, MD;  Location[facility];  Service: Ophthalmology;  Laterality: Left;  14.77 1:24.0    ESOPHAGOGASTRODUODENOSCOPY (EGD) WITH PROPOFOL N/A 03/21/2023   Procedure: ESOPHAGOGASTRODUODENOSCOPY (EGD) WITH PROPOFOL;  Surgeon: [name], MD;  Location: [facility];  Service: Gastroenterology;  Laterality: N/A;   HERNIA REPAIR Left    inguinal   INSERTION OF MESH  09/01/2021   Procedure: INSERTION OF MESH;  Surgeon: Carolan Shiver, MD;  Location: [facility];  Service: General;;   JOINT REPLACEMENT     tka   UMBILICAL HERNIA REPAIR     Patient Active Problem List   Diagnosis Date Noted   Acute myocardial infarction, unspecified (HCC) 11/16/2022   Anxiety state 11/16/2022   Balanitis 11/16/2022   Benign paroxysmal positional vertigo 11/16/2022   Cardiac dysrhythmia 11/16/2022   Chronic ischemic heart disease 11/16/2022   Chronic sinusitis 11/16/2022   Gastroesophageal reflux disease 11/16/2022   Headache 11/16/2022   Counseling, unspecified 11/16/2022   History of occlusive disease of artery of lower extremity 11/16/2022   Hypertensive chronic kidney disease with stage 1 through stage 4 chronic kidney disease, or unspecified chronic kidney disease 11/16/2022   Irritable bowel syndrome 11/16/2022   Unspecified dementia, mild, without behavioral disturbance, psychotic disturbance, mood disturbance, and anxiety (HCC) 11/16/2022   Obstructive sleep apnea (adult) (pediatric) 11/16/2022   Osteoarthritis of  knee 11/16/2022   Personal history of tuberculosis 11/16/2022   Phimosis 11/16/2022   Restless legs 11/16/2022   Sensorineural hearing loss, bilateral 11/16/2022   Type 2 diabetes mellitus without complications (HCC) 11/16/2022   Diabetic polyneuropathy associated with type 2 diabetes mellitus (HCC) 06/24/2021   Healthcare maintenance 06/24/2021   CAD (coronary artery disease) 09/02/2020   Pure hypercholesterolemia 09/02/2020   Polycystic kidney 02/11/2020   Benign prostatic hyperplasia 02/11/2020   Essential hypertension 02/11/2020   Acquired cyst of kidney  07/09/2019   Spinal stenosis of lumbar region 05/30/2019   Cervical spondylosis 03/12/2019   Dysphagia 03/26/2015   Hypermetropia 02/04/2015   Presbyopia 02/04/2015   Senile nuclear cataract 02/04/2015   Epistaxis 09/23/2014   Laryngopharyngeal reflux (LPR) 09/23/2014    ONSET DATE: worse since Jan 2024  REFERRING DIAG:  Diagnosis  G20.A1 (ICD-10-CM) - Parkinson's disease    THERAPY DIAG:  Muscle weakness (generalized)  Difficulty in walking, not elsewhere classified  Unsteadiness on feet  Abnormality of gait and mobility  Other abnormalities of gait and mobility  Other lack of coordination  Parkinson's disease with dyskinesia, unspecified whether manifestations fluctuate (HCC)  Rationale for Evaluation and Treatment: Rehabilitation  SUBJECTIVE:                                                                                                                                                                                             SUBJECTIVE STATEMENT: Patient reports no more falls since last visit.    Pt accompanied by: self  PERTINENT HISTORY:   Per neurology note submitted in chart by Janice Coffin, PA.  1. 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 Memory loss starting 2018 with cognitive decline over 2022. Patient reports difficulty remembering conversations, difficulty recognizing old friends, getting lost while driving. Per spouse patient can become aggressive behind the wheel. Patient reports visual hallucinations (dark areas out of his periphery) and audio hallucinations. Wife assists with medications and finances. Has become more emotional over the last year and sometimes becomes angry with spouse which is unusual. Denies alcohol use or difficulty sleeping. Per wife significant  procrastiantion, low motivation, more hunched forward posture, balance issues, and gets going walking and has a difficult time stopping.   Patient was cleared to drive per driving assessment, however, we discussed that memory may not keep up with the physical ability to drive if condition continues to get worse   Ambulatory Referral to Physical Therapy [facility])   2. Peripheral neuropathy- decreased  sensation with some pain in the feet in a patient with history of agent orange exposure.  3. Orthostatic Lightheadedness/Vertigo, chronic episodic vertigo  Orthostatic hypotension (neurogenic) can be seen in many conditions such as parkinsonism, diabetic neuropathy, pure autonomic failure etc. Patient should have work up to rule out other causes first such as dehydration, cardiac issues, iatrogenic due to meds etc. Lightheadedness, fainting, fatigue, blurry vision, weakness etc can happen as symptoms. Patient should drink plenty of water, wear compression stockings, exercise, liberal salts, elevate the head end of the bed etc. Fludrocortisone, Midodrine, droxidopa etc. Can be tried in certain patients.   PAIN:  Are you having pain? No  PRECAUTIONS: Fall  RED FLAGS: None   WEIGHT BEARING RESTRICTIONS: No  FALLS: Has patient fallen in last 6 months? No  LIVING ENVIRONMENT: Lives with: Wife Lives in: House/apartment Stairs: Yes: External: 5 steps; can reach both Has following equipment at home: Single point cane and Walker - 2 wheeled  PLOF: Independent  PATIENT GOALS: I want to be stronger and more steady  OBJECTIVE:   DIAGNOSTIC FINDINGS: CLINICAL DATA:  Provided history: Mild cognitive impairment with memory loss.   EXAM: MRI HEAD WITHOUT CONTRAST   TECHNIQUE: Multiplanar, multiecho pulse sequences of the brain and surrounding structures were obtained without intravenous contrast.   COMPARISON:  Head CT 07/31/2021.   FINDINGS: Brain:   Mild-to-moderate generalized  cerebral atrophy. Commensurate prominence of the ventricles and sulci. Comparatively mild cerebellar atrophy.   Multifocal T2 FLAIR hyperintense signal abnormality within the cerebral white matter and pons, nonspecific but compatible with moderate chronic small vessel ischemic disease.   Mild chronic small vessel ischemic changes within the thalami.   There is no acute infarct.   No evidence of an intracranial mass.   No chronic intracranial blood products.   No extra-axial fluid collection.   No midline shift.   Vascular: Maintained flow voids within the proximal large arterial vessels.   Skull and upper cervical spine: No focal suspicious marrow lesion. Degenerative changes and ligamentous hypertrophy at the C1-C2 articulation.   Sinuses/Orbits: No mass or acute finding within the imaged orbits. Prior right ocular lens replacement. Mild mucosal thickening within the bilateral ethmoid and right sphenoid sinuses.   Other: Small-volume fluid within the right mastoid air cells.   IMPRESSION: 1. No evidence of acute intracranial abnormality. 2. Moderate chronic small vessel ischemic changes within the cerebral white matter and pons. 3. Mild chronic small-vessel ischemic changes within the thalami. 4. Mild-to-moderate generalized cerebral atrophy. Comparatively mild cerebellar atrophy. 5. Mild mucosal thickening within the bilateral ethmoid and right sphenoid sinuses. 6. Small-volume fluid within the right mastoid air cells.     Electronically Signed   By: Jackey Loge D.O.   On: 11/11/2021 17:53    COGNITION: Overall cognitive status: Impaired   SENSATION: Light touch: Impaired   COORDINATION: Some difficulty sequencing   POSTURE: forward head   LOWER EXTREMITY MMT:    MMT Right Eval Left Eval  Hip flexion 3+ 4  Hip extension 4 4  Hip abduction 4 4  Hip adduction 4 4  Hip internal rotation 4 4  Hip external rotation 4 4  Knee flexion 4 4  Knee  extension 4 4  Ankle dorsiflexion    Ankle plantarflexion    Ankle inversion    Ankle eversion    (Blank rows = not tested)    TRANSFERS: Assistive device utilized: None  Sit to stand: Complete Independence Stand to sit: Complete Independence Chair  to chair: Complete Independence Floor:  Not tested  GAIT: Gait pattern: step through pattern, decreased arm swing- Right, decreased arm swing- Left, decreased step length- Right, and decreased step length- Left Distance walked: 100+ feet Assistive device utilized: None Level of assistance: SBA Comments: Shuffling gait  FUNCTIONAL TESTS:  5 times sit to stand: 18.48 sec without UE Support  Timed up and go (TUG): 16.42 sec without UE support 6 minute walk test: To be tested 2nd visit 10 meter walk test: 14.84 and 14.62 sec  = 0.69 m/s avg without UE support Berg Balance Scale: 43/56  PATIENT SURVEYS:  FOTO 49 with goal of 72  TODAY'S TREATMENT:                                                                                                                              DATE: 08/03/2023     HR= 108 bpm pre session  Gait training:    Gait with upright 4WW practice walking in hospital- adjusted height of armrest- approx 200 feet exhibiting improved posture and only VC to clear his feet.   TA:  Sit to stand holding onto 5Kg ball - 2 sets of 10 reps Seated hip march-5# 2 sets of 10 reps to focus on improving step height with walking.  Lumbar flex into ext (against wall) holding 15# KB- focusing on posture- 2 x 10 reps    NMR:   Step tap onto 1st step without UE support and 3# AW- 10 reps alt LE Step tap onto 2nd step without UE support & 3# AW- 10 reps alt LE Heel to toe gait sequencing activity- Start off in toe off position and then swing back LE forward maintaining ankle DF into proper         PATIENT EDUCATION: Education details: Purpose of PT and plan of care Person educated: Patient Education method:  Explanation Education comprehension: verbalized understanding  HOME EXERCISE PROGRAM:   GOALS: Goals reviewed with patient? Yes  SHORT TERM GOALS: Target date: 04/05/2023  Pt will be independent with HEP in order to improve strength and balance in order to decrease fall risk and improve function at home and work.   Baseline: EVAL- No formal HEP In place; 04/11/2023- patient reports walking daily and attempting not to shuffle and going to [place] steady. 06/22/2023- Patient reports inconsistent with HEP due to [facility] and traveling. Goal status: ONGOING   LONG TERM GOALS: Target date: 09/14/2023  1.  Patient (> 87 years old) will complete five times sit to stand test in < 15 seconds indicating an increased LE strength and improved balance. Baseline: EVAL= 18.48 sec without UE support; 04/11/2023= 14.94 sec without UE support. 06/22/2023= 16.54 sec without UE support Goal status: PROGRESSING  2.  Patient will increase FOTO score to equal to or greater than  59   to demonstrate statistically significant improvement in mobility and quality of life.  Baseline: EVAL=49; 04/11/2023= 59; 06/22/2023=64 Goal status: MET  3.  Patient will increase Berg Balance score by > 6 points to demonstrate decreased fall risk during functional activities. Baseline: EVAL=43/56; 06/22/2023=44/56 Goal status: ONGOING   4.  Patient will reduce timed up and go to <11 seconds to reduce fall risk and demonstrate improved transfer/gait ability. Baseline: EVAL=16.42; 04/11/2023= 10 sec without UE support Goal status: MET  5.  Patient will increase 10 meter walk test to >1.69m/s as to improve gait speed for better community ambulation and to reduce fall risk. Baseline: EVAL= 0.69; 04/11/2023= 1.0 m/s without AD. 06/22/2023=0.82 m/s Goal status: Ongoing  6. Patient will increase six minute walk test distance to >1000 for progression to community ambulator and improve gait ability Baseline:03/08/23: 575 ft with progressive  shuffling and flexion posture throughout. 04/11/2023= 925 feet with some festinating gait- worse with increased distance.  07/13/2023= 1005 feet    Goal status: PROGRESSING  7.   Patient will reduce timed up and go to <10 seconds to reduce fall risk and demonstrate improved transfer/gait ability. Baseline:  06/22/2023= 13.41 sec  Goal status: NEW   ASSESSMENT:  CLINICAL IMPRESSION: Patient presents with good motivation for today's session. Continuing with focus on improving step length activities and patient performed well with segmental activities and was abel to walk better with improved overall step length and use of upright 4WW. Pt will continue to benefit from skilled physical therapy intervention to address impairments, improve QOL, and attain therapy goals.    OBJECTIVE IMPAIRMENTS: Abnormal gait, decreased activity tolerance, decreased balance, decreased cognition, decreased coordination, decreased endurance, decreased mobility, difficulty walking, decreased ROM, decreased strength, hypomobility, and postural dysfunction.   ACTIVITY LIMITATIONS: carrying, lifting, bending, standing, squatting, and stairs  PARTICIPATION LIMITATIONS: cleaning, shopping, community activity, and yard work  PERSONAL FACTORS: Age and 3+ comorbidities: HTN, Vertigo, arthritis  are also affecting patient's functional outcome.   REHAB POTENTIAL: Good  CLINICAL DECISION MAKING: Stable/uncomplicated  EVALUATION COMPLEXITY: Moderate  PLAN:  PT FREQUENCY: 1-2x/week  PT DURATION: 12 weeks  PLANNED INTERVENTIONS: Therapeutic exercises, Therapeutic activity, Neuromuscular re-education, Balance training, Gait training, Patient/Family education, Self Care, Joint mobilization, Joint manipulation, Stair training, Vestibular training, Canalith repositioning, Orthotic/Fit training, DME instructions, Dry Needling, Spinal manipulation, Spinal mobilization, Cryotherapy, Moist heat, Taping, Manual therapy, and  Re-evaluation  PLAN FOR NEXT SESSION:    Progress balance and LE strengthening - add to HEP as appropriate. Continue with safety training with all mobility.      Louis Meckel, PT  08/04/2023, 11:23 AM

## 2023-08-07 ENCOUNTER — Ambulatory Visit
Admission: RE | Admit: 2023-08-07 | Discharge: 2023-08-07 | Disposition: A | Discharge status: Home / Self Care | Payer: Medicare Other | Source: Ambulatory Visit | Attending: Student | Admitting: Student

## 2023-08-07 DIAGNOSIS — R519 Headache, unspecified: Secondary | ICD-10-CM | POA: Diagnosis present

## 2023-08-08 ENCOUNTER — Ambulatory Visit: Payer: Medicare Other

## 2023-08-08 DIAGNOSIS — R269 Unspecified abnormalities of gait and mobility: Secondary | ICD-10-CM

## 2023-08-08 DIAGNOSIS — M6281 Muscle weakness (generalized): Secondary | ICD-10-CM

## 2023-08-08 DIAGNOSIS — R262 Difficulty in walking, not elsewhere classified: Secondary | ICD-10-CM

## 2023-08-08 DIAGNOSIS — R2689 Other abnormalities of gait and mobility: Secondary | ICD-10-CM

## 2023-08-08 DIAGNOSIS — R278 Other lack of coordination: Secondary | ICD-10-CM

## 2023-08-08 DIAGNOSIS — R2681 Unsteadiness on feet: Secondary | ICD-10-CM

## 2023-08-08 DIAGNOSIS — G20B1 Parkinson's disease with dyskinesia, without mention of fluctuations: Secondary | ICD-10-CM

## 2023-08-08 NOTE — Therapy (Signed)
OUTPATIENT PHYSICAL THERAPY NEURO TREATMENT    Patient Name: Carlos Mathis MRN: [id number] DOB:August 22, 1940, 83 y.o., male Today's Date: 08/08/2023   PCP: Dr. Einar Crow REFERRING PROVIDER: Dr. Cristopher Peru  END OF SESSION:  PT End of Session - 08/08/23 1021     Visit Number 29    Number of Visits 44    Date for PT Re-Evaluation 09/14/23    Progress Note Due on Visit 30    PT Start Time 1017    PT Stop Time 1056    PT Time Calculation (min) 39 min    Equipment Utilized During Treatment Gait belt    Activity Tolerance Patient tolerated treatment well;No increased pain    Behavior During Therapy WFL for tasks assessed/performed                       Past Medical History:  Diagnosis Date   Arthritis    BPH (benign prostatic hyperplasia)    CKD (chronic kidney disease), stage III (HCC)    Coronary artery disease    GERD (gastroesophageal reflux disease)    History of hiatal hernia    HLD (hyperlipidemia)    Hypertension    Long term current use of antithrombotics/antiplatelets    a.) DAPT therapy (ASA+ ticagrelor)   MCI (mild cognitive impairment) with memory loss    a.) takes apoaequorin   Migraines    OSA on CPAP    Pneumonia    Skin cancer, basal cell    STEMI (ST elevation myocardial infarction) (HCC) 07/27/2020   a.) MI while living in [place]; underwent PCI placing 4.0 x 18 mm and 3.5 x 38 mm Resolute Onyx DES (vessels unknown/unspecified).   T2DM (type 2 diabetes mellitus) (HCC)    Vertigo    Wears dentures    Full upper, partial lower   Wears hearing aid in both ears    Past Surgical History:  Procedure Laterality Date   CARDIAC CATHETERIZATION     CATARACT EXTRACTION Right    CATARACT EXTRACTION W/PHACO Left 05/03/2022   Procedure: CATARACT EXTRACTION PHACO AND INTRAOCULAR LENS PLACEMENT (IOC) LEFT DIABETIC;  Surgeon: Galen Manila, MD;  Location: [facility];  Service: Ophthalmology;  Laterality: Left;  14.77 1:24.0    ESOPHAGOGASTRODUODENOSCOPY (EGD) WITH PROPOFOL N/A 03/21/2023   Procedure: ESOPHAGOGASTRODUODENOSCOPY (EGD) WITH PROPOFOL;  Surgeon: [name], MD;  Location: [facility];  Service: Gastroenterology;  Laterality: N/A;   HERNIA REPAIR Left    inguinal   INSERTION OF MESH  09/01/2021   Procedure: INSERTION OF MESH;  Surgeon: Carolan Shiver, MD;  Location: [facility];  Service: General;;   JOINT REPLACEMENT     tka   UMBILICAL HERNIA REPAIR     Patient Active Problem List   Diagnosis Date Noted   Acute myocardial infarction, unspecified (HCC) 11/16/2022   Anxiety state 11/16/2022   Balanitis 11/16/2022   Benign paroxysmal positional vertigo 11/16/2022   Cardiac dysrhythmia 11/16/2022   Chronic ischemic heart disease 11/16/2022   Chronic sinusitis 11/16/2022   Gastroesophageal reflux disease 11/16/2022   Headache 11/16/2022   Counseling, unspecified 11/16/2022   History of occlusive disease of artery of lower extremity 11/16/2022   Hypertensive chronic kidney disease with stage 1 through stage 4 chronic kidney disease, or unspecified chronic kidney disease 11/16/2022   Irritable bowel syndrome 11/16/2022   Unspecified dementia, mild, without behavioral disturbance, psychotic disturbance, mood disturbance, and anxiety (HCC) 11/16/2022   Obstructive sleep apnea (adult) (pediatric) 11/16/2022   Osteoarthritis of  knee 11/16/2022   Personal history of tuberculosis 11/16/2022   Phimosis 11/16/2022   Restless legs 11/16/2022   Sensorineural hearing loss, bilateral 11/16/2022   Type 2 diabetes mellitus without complications (HCC) 11/16/2022   Diabetic polyneuropathy associated with type 2 diabetes mellitus (HCC) 06/24/2021   Healthcare maintenance 06/24/2021   CAD (coronary artery disease) 09/02/2020   Pure hypercholesterolemia 09/02/2020   Polycystic kidney 02/11/2020   Benign prostatic hyperplasia 02/11/2020   Essential hypertension 02/11/2020   Acquired cyst of kidney  07/09/2019   Spinal stenosis of lumbar region 05/30/2019   Cervical spondylosis 03/12/2019   Dysphagia 03/26/2015   Hypermetropia 02/04/2015   Presbyopia 02/04/2015   Senile nuclear cataract 02/04/2015   Epistaxis 09/23/2014   Laryngopharyngeal reflux (LPR) 09/23/2014    ONSET DATE: worse since Jan 2024  REFERRING DIAG:  Diagnosis  G20.A1 (ICD-10-CM) - Parkinson's disease    THERAPY DIAG:  Muscle weakness (generalized)  Difficulty in walking, not elsewhere classified  Unsteadiness on feet  Abnormality of gait and mobility  Other abnormalities of gait and mobility  Other lack of coordination  Parkinson's disease with dyskinesia, unspecified whether manifestations fluctuate (HCC)  Rationale for Evaluation and Treatment: Rehabilitation  SUBJECTIVE:                                                                                                                                                                                             SUBJECTIVE STATEMENT: Patient reports no more falls since last visit. He states he feels his legs need to be stronger.    Pt accompanied by: self  PERTINENT HISTORY:   Per neurology note submitted in chart by Janice Coffin, PA.  1. 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 Memory loss starting 2018 with cognitive decline over 2022. Patient reports difficulty remembering conversations, difficulty recognizing old friends, getting lost while driving. Per spouse patient can become aggressive behind the wheel. Patient reports visual hallucinations (dark areas out of his periphery) and audio hallucinations. Wife assists with medications and finances. Has become more emotional over the last year and sometimes becomes angry with spouse which is unusual. Denies alcohol use  or difficulty sleeping. Per wife significant procrastiantion, low motivation, more hunched forward posture, balance issues, and gets going walking and has a difficult time stopping.   Patient was cleared to drive per driving assessment, however, we discussed that memory may not keep up with the physical ability to drive if condition continues to get worse   Ambulatory Referral to Physical  Therapy [facility])   2. Peripheral neuropathy- decreased sensation with some pain in the feet in a patient with history of agent orange exposure.  3. Orthostatic Lightheadedness/Vertigo, chronic episodic vertigo  Orthostatic hypotension (neurogenic) can be seen in many conditions such as parkinsonism, diabetic neuropathy, pure autonomic failure etc. Patient should have work up to rule out other causes first such as dehydration, cardiac issues, iatrogenic due to meds etc. Lightheadedness, fainting, fatigue, blurry vision, weakness etc can happen as symptoms. Patient should drink plenty of water, wear compression stockings, exercise, liberal salts, elevate the head end of the bed etc. Fludrocortisone, Midodrine, droxidopa etc. Can be tried in certain patients.   PAIN:  Are you having pain? No  PRECAUTIONS: Fall  RED FLAGS: None   WEIGHT BEARING RESTRICTIONS: No  FALLS: Has patient fallen in last 6 months? No  LIVING ENVIRONMENT: Lives with: Wife Lives in: House/apartment Stairs: Yes: External: 5 steps; can reach both Has following equipment at home: Single point cane and Walker - 2 wheeled  PLOF: Independent  PATIENT GOALS: I want to be stronger and more steady  OBJECTIVE:   DIAGNOSTIC FINDINGS: CLINICAL DATA:  Provided history: Mild cognitive impairment with memory loss.   EXAM: MRI HEAD WITHOUT CONTRAST   TECHNIQUE: Multiplanar, multiecho pulse sequences of the brain and surrounding structures were obtained without intravenous contrast.   COMPARISON:  Head CT 07/31/2021.    FINDINGS: Brain:   Mild-to-moderate generalized cerebral atrophy. Commensurate prominence of the ventricles and sulci. Comparatively mild cerebellar atrophy.   Multifocal T2 FLAIR hyperintense signal abnormality within the cerebral white matter and pons, nonspecific but compatible with moderate chronic small vessel ischemic disease.   Mild chronic small vessel ischemic changes within the thalami.   There is no acute infarct.   No evidence of an intracranial mass.   No chronic intracranial blood products.   No extra-axial fluid collection.   No midline shift.   Vascular: Maintained flow voids within the proximal large arterial vessels.   Skull and upper cervical spine: No focal suspicious marrow lesion. Degenerative changes and ligamentous hypertrophy at the C1-C2 articulation.   Sinuses/Orbits: No mass or acute finding within the imaged orbits. Prior right ocular lens replacement. Mild mucosal thickening within the bilateral ethmoid and right sphenoid sinuses.   Other: Small-volume fluid within the right mastoid air cells.   IMPRESSION: 1. No evidence of acute intracranial abnormality. 2. Moderate chronic small vessel ischemic changes within the cerebral white matter and pons. 3. Mild chronic small-vessel ischemic changes within the thalami. 4. Mild-to-moderate generalized cerebral atrophy. Comparatively mild cerebellar atrophy. 5. Mild mucosal thickening within the bilateral ethmoid and right sphenoid sinuses. 6. Small-volume fluid within the right mastoid air cells.     Electronically Signed   By: Jackey Loge D.O.   On: 11/11/2021 17:53    COGNITION: Overall cognitive status: Impaired   SENSATION: Light touch: Impaired   COORDINATION: Some difficulty sequencing   POSTURE: forward head   LOWER EXTREMITY MMT:    MMT Right Eval Left Eval  Hip flexion 3+ 4  Hip extension 4 4  Hip abduction 4 4  Hip adduction 4 4  Hip internal rotation 4 4   Hip external rotation 4 4  Knee flexion 4 4  Knee extension 4 4  Ankle dorsiflexion    Ankle plantarflexion    Ankle inversion    Ankle eversion    (Blank rows = not tested)    TRANSFERS: Assistive device utilized: None  Sit  to stand: Complete Independence Stand to sit: Complete Independence Chair to chair: Complete Independence Floor:  Not tested  GAIT: Gait pattern: step through pattern, decreased arm swing- Right, decreased arm swing- Left, decreased step length- Right, and decreased step length- Left Distance walked: 100+ feet Assistive device utilized: None Level of assistance: SBA Comments: Shuffling gait  FUNCTIONAL TESTS:  5 times sit to stand: 18.48 sec without UE Support  Timed up and go (TUG): 16.42 sec without UE support 6 minute walk test: To be tested 2nd visit 10 meter walk test: 14.84 and 14.62 sec  = 0.69 m/s avg without UE support Berg Balance Scale: 43/56  PATIENT SURVEYS:  FOTO 49 with goal of 70  TODAY'S TREATMENT:                                                                                                                              DATE: 08/03/2023      Gait training:    Gait with upright 4WW practice walking in hospital- adjusted height of armrest- approx 200 feet exhibiting improved posture and only VC to clear his feet.   TA:  Sit to stand holding onto 5Kg ball -15 reps Seated hip march-5# 2 sets of 10 reps to focus on improving step height with walking.  Lumbar flex into ext (against wall) holding 15# KB- focusing on posture- 2 x 10 reps  Step up onto 6" block with 1UE support x 10 each LE.  Standing mini lunge squat with 1 UE support x 10 reps each LE  NMR:   Step tap onto cone with 1UE support and 3# AW- 10 reps alt LE  Heel to toe gait sequencing activity- Start off in toe off position and then swing back LE forward maintaining ankle DF into proper   Postural awareness activities: - Scap row GTjB- 2 sets of 10 reps   -Standing horizontal shoulder ABD at wall - 2 sets of 10 reps  -Standing Overhead reaching with ball focusing on thoracic ext 2 sets of 10 reps -Standing at wall- chop wood motion- ball overhead diagonal x 15 reps       PATIENT EDUCATION: Education details: Purpose of PT and plan of care Person educated: Patient Education method: Explanation Education comprehension: verbalized understanding  HOME EXERCISE PROGRAM:   GOALS: Goals reviewed with patient? Yes  SHORT TERM GOALS: Target date: 04/05/2023  Pt will be independent with HEP in order to improve strength and balance in order to decrease fall risk and improve function at home and work.   Baseline: EVAL- No formal HEP In place; 04/11/2023- patient reports walking daily and attempting not to shuffle and going to [place] steady. 06/22/2023- Patient reports inconsistent with HEP due to [facility] and traveling. Goal status: ONGOING   LONG TERM GOALS: Target date: 09/14/2023  1.  Patient (> 17 years old) will complete five times sit to stand test in < 15 seconds indicating an increased LE strength  and improved balance. Baseline: EVAL= 18.48 sec without UE support; 04/11/2023= 14.94 sec without UE support. 06/22/2023= 16.54 sec without UE support Goal status: PROGRESSING  2.  Patient will increase FOTO score to equal to or greater than  59   to demonstrate statistically significant improvement in mobility and quality of life.  Baseline: EVAL=49; 04/11/2023= 59; 06/22/2023=64 Goal status: MET   3.  Patient will increase Berg Balance score by > 6 points to demonstrate decreased fall risk during functional activities. Baseline: EVAL=43/56; 06/22/2023=44/56 Goal status: ONGOING   4.  Patient will reduce timed up and go to <11 seconds to reduce fall risk and demonstrate improved transfer/gait ability. Baseline: EVAL=16.42; 04/11/2023= 10 sec without UE support Goal status: MET  5.  Patient will increase 10 meter walk test to >1.47m/s as to  improve gait speed for better community ambulation and to reduce fall risk. Baseline: EVAL= 0.69; 04/11/2023= 1.0 m/s without AD. 06/22/2023=0.82 m/s Goal status: Ongoing  6. Patient will increase six minute walk test distance to >1000 for progression to community ambulator and improve gait ability Baseline:03/08/23: 575 ft with progressive shuffling and flexion posture throughout. 04/11/2023= 925 feet with some festinating gait- worse with increased distance.  07/13/2023= 1005 feet    Goal status: PROGRESSING  7.   Patient will reduce timed up and go to <10 seconds to reduce fall risk and demonstrate improved transfer/gait ability. Baseline:  06/22/2023= 13.41 sec  Goal status: NEW   ASSESSMENT:  CLINICAL IMPRESSION: Patient responded well to all activities today. Challenged with posture awareness/strengthening and able to step up well overall today without pain or LOB. Pt will continue to benefit from skilled physical therapy intervention to address impairments, improve QOL, and attain therapy goals.    OBJECTIVE IMPAIRMENTS: Abnormal gait, decreased activity tolerance, decreased balance, decreased cognition, decreased coordination, decreased endurance, decreased mobility, difficulty walking, decreased ROM, decreased strength, hypomobility, and postural dysfunction.   ACTIVITY LIMITATIONS: carrying, lifting, bending, standing, squatting, and stairs  PARTICIPATION LIMITATIONS: cleaning, shopping, community activity, and yard work  PERSONAL FACTORS: Age and 3+ comorbidities: HTN, Vertigo, arthritis  are also affecting patient's functional outcome.   REHAB POTENTIAL: Good  CLINICAL DECISION MAKING: Stable/uncomplicated  EVALUATION COMPLEXITY: Moderate  PLAN:  PT FREQUENCY: 1-2x/week  PT DURATION: 12 weeks  PLANNED INTERVENTIONS: Therapeutic exercises, Therapeutic activity, Neuromuscular re-education, Balance training, Gait training, Patient/Family education, Self Care, Joint  mobilization, Joint manipulation, Stair training, Vestibular training, Canalith repositioning, Orthotic/Fit training, DME instructions, Dry Needling, Spinal manipulation, Spinal mobilization, Cryotherapy, Moist heat, Taping, Manual therapy, and Re-evaluation  PLAN FOR NEXT SESSION:    Progress balance and LE strengthening - add to HEP as appropriate. Continue with safety training with all mobility.      Louis Meckel, PT  08/08/2023, 3:49 PM

## 2023-08-10 ENCOUNTER — Ambulatory Visit: Payer: Medicare Other

## 2023-08-14 NOTE — Therapy (Signed)
 OUTPATIENT PHYSICAL THERAPY NEURO TREATMENT/Physical Therapy Progress Note   Dates of reporting period  06/22/2023   to   08/15/2023     Patient Name: Carlos Mathis MRN: [id number] DOB:10/16/1940, 83 y.o., male Today's Date: 08/15/2023   PCP: Dr. Einar Crow REFERRING PROVIDER: Dr. Cristopher Peru  END OF SESSION:  PT End of Session - 08/15/23 1024     Visit Number 30    Number of Visits 44    Date for PT Re-Evaluation 09/14/23    Progress Note Due on Visit 30    PT Start Time 1017    PT Stop Time 1059    PT Time Calculation (min) 42 min    Equipment Utilized During Treatment Gait belt    Activity Tolerance Patient tolerated treatment well;No increased pain    Behavior During Therapy WFL for tasks assessed/performed                        Past Medical History:  Diagnosis Date   Arthritis    BPH (benign prostatic hyperplasia)    CKD (chronic kidney disease), stage III (HCC)    Coronary artery disease    GERD (gastroesophageal reflux disease)    History of hiatal hernia    HLD (hyperlipidemia)    Hypertension    Long term current use of antithrombotics/antiplatelets    a.) DAPT therapy (ASA+ ticagrelor)   MCI (mild cognitive impairment) with memory loss    a.) takes apoaequorin   Migraines    OSA on CPAP    Pneumonia    Skin cancer, basal cell    STEMI (ST elevation myocardial infarction) (HCC) 07/27/2020   a.) MI while living in [place]; underwent PCI placing 4.0 x 18 mm and 3.5 x 38 mm Resolute Onyx DES (vessels unknown/unspecified).   T2DM (type 2 diabetes mellitus) (HCC)    Vertigo    Wears dentures    Full upper, partial lower   Wears hearing aid in both ears    Past Surgical History:  Procedure Laterality Date   CARDIAC CATHETERIZATION     CATARACT EXTRACTION Right    CATARACT EXTRACTION W/PHACO Left 05/03/2022   Procedure: CATARACT EXTRACTION PHACO AND INTRAOCULAR LENS PLACEMENT (IOC) LEFT DIABETIC;  Surgeon: Galen Manila, MD;   Location: [facility];  Service: Ophthalmology;  Laterality: Left;  14.77 1:24.0   ESOPHAGOGASTRODUODENOSCOPY (EGD) WITH PROPOFOL N/A 03/21/2023   Procedure: ESOPHAGOGASTRODUODENOSCOPY (EGD) WITH PROPOFOL;  Surgeon: [name], MD;  Location: [facility];  Service: Gastroenterology;  Laterality: N/A;   HERNIA REPAIR Left    inguinal   INSERTION OF MESH  09/01/2021   Procedure: INSERTION OF MESH;  Surgeon: Carolan Shiver, MD;  Location: [facility];  Service: General;;   JOINT REPLACEMENT     tka   UMBILICAL HERNIA REPAIR     Patient Active Problem List   Diagnosis Date Noted   Acute myocardial infarction, unspecified (HCC) 11/16/2022   Anxiety state 11/16/2022   Balanitis 11/16/2022   Benign paroxysmal positional vertigo 11/16/2022   Cardiac dysrhythmia 11/16/2022   Chronic ischemic heart disease 11/16/2022   Chronic sinusitis 11/16/2022   Gastroesophageal reflux disease 11/16/2022   Headache 11/16/2022   Counseling, unspecified 11/16/2022   History of occlusive disease of artery of lower extremity 11/16/2022   Hypertensive chronic kidney disease with stage 1 through stage 4 chronic kidney disease, or unspecified chronic kidney disease 11/16/2022   Irritable bowel syndrome 11/16/2022   Unspecified dementia, mild, without behavioral disturbance, psychotic  disturbance, mood disturbance, and anxiety (HCC) 11/16/2022   Obstructive sleep apnea (adult) (pediatric) 11/16/2022   Osteoarthritis of knee 11/16/2022   Personal history of tuberculosis 11/16/2022   Phimosis 11/16/2022   Restless legs 11/16/2022   Sensorineural hearing loss, bilateral 11/16/2022   Type 2 diabetes mellitus without complications (HCC) 11/16/2022   Diabetic polyneuropathy associated with type 2 diabetes mellitus (HCC) 06/24/2021   Healthcare maintenance 06/24/2021   CAD (coronary artery disease) 09/02/2020   Pure hypercholesterolemia 09/02/2020   Polycystic kidney 02/11/2020   Benign prostatic  hyperplasia 02/11/2020   Essential hypertension 02/11/2020   Acquired cyst of kidney 07/09/2019   Spinal stenosis of lumbar region 05/30/2019   Cervical spondylosis 03/12/2019   Dysphagia 03/26/2015   Hypermetropia 02/04/2015   Presbyopia 02/04/2015   Senile nuclear cataract 02/04/2015   Epistaxis 09/23/2014   Laryngopharyngeal reflux (LPR) 09/23/2014    ONSET DATE: worse since Jan 2024  REFERRING DIAG:  Diagnosis  G20.A1 (ICD-10-CM) - Parkinson's disease    THERAPY DIAG:  Muscle weakness (generalized)  Difficulty in walking, not elsewhere classified  Unsteadiness on feet  Abnormality of gait and mobility  Other abnormalities of gait and mobility  Other lack of coordination  Parkinson's disease with dyskinesia, unspecified whether manifestations fluctuate (HCC)  Rationale for Evaluation and Treatment: Rehabilitation  SUBJECTIVE:                                                                                                                                                                                             SUBJECTIVE STATEMENT: Patient reports doing okay with no new complaints. States he has to go to [place] this week to attend a wedding on Saturday.    Pt accompanied by: self  PERTINENT HISTORY:   Per neurology note submitted in chart by Janice Coffin, PA.  1. 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 Memory loss starting 2018 with cognitive decline over 2022. Patient reports difficulty remembering conversations, difficulty recognizing old friends, getting lost while driving. Per spouse patient can become aggressive behind the wheel. Patient reports visual hallucinations (dark areas out of his periphery) and audio hallucinations. Wife assists with medications and finances. Has  become more emotional over the last year and sometimes becomes angry with spouse which is unusual. Denies alcohol use or difficulty sleeping. Per wife significant procrastiantion, low motivation, more hunched forward posture, balance issues, and gets going walking and has a difficult time stopping.   Patient was cleared to drive per driving assessment, however, we discussed  that memory may not keep up with the physical ability to drive if condition continues to get worse   Ambulatory Referral to Physical Therapy [facility])   2. Peripheral neuropathy- decreased sensation with some pain in the feet in a patient with history of agent orange exposure.  3. Orthostatic Lightheadedness/Vertigo, chronic episodic vertigo  Orthostatic hypotension (neurogenic) can be seen in many conditions such as parkinsonism, diabetic neuropathy, pure autonomic failure etc. Patient should have work up to rule out other causes first such as dehydration, cardiac issues, iatrogenic due to meds etc. Lightheadedness, fainting, fatigue, blurry vision, weakness etc can happen as symptoms. Patient should drink plenty of water, wear compression stockings, exercise, liberal salts, elevate the head end of the bed etc. Fludrocortisone, Midodrine, droxidopa etc. Can be tried in certain patients.   PAIN:  Are you having pain? No  PRECAUTIONS: Fall  RED FLAGS: None   WEIGHT BEARING RESTRICTIONS: No  FALLS: Has patient fallen in last 6 months? No  LIVING ENVIRONMENT: Lives with: Wife Lives in: House/apartment Stairs: Yes: External: 5 steps; can reach both Has following equipment at home: Single point cane and Walker - 2 wheeled  PLOF: Independent  PATIENT GOALS: I want to be stronger and more steady  OBJECTIVE:   DIAGNOSTIC FINDINGS: CLINICAL DATA:  Provided history: Mild cognitive impairment with memory loss.   EXAM: MRI HEAD WITHOUT CONTRAST   TECHNIQUE: Multiplanar, multiecho pulse sequences of the brain and  surrounding structures were obtained without intravenous contrast.   COMPARISON:  Head CT 07/31/2021.   FINDINGS: Brain:   Mild-to-moderate generalized cerebral atrophy. Commensurate prominence of the ventricles and sulci. Comparatively mild cerebellar atrophy.   Multifocal T2 FLAIR hyperintense signal abnormality within the cerebral white matter and pons, nonspecific but compatible with moderate chronic small vessel ischemic disease.   Mild chronic small vessel ischemic changes within the thalami.   There is no acute infarct.   No evidence of an intracranial mass.   No chronic intracranial blood products.   No extra-axial fluid collection.   No midline shift.   Vascular: Maintained flow voids within the proximal large arterial vessels.   Skull and upper cervical spine: No focal suspicious marrow lesion. Degenerative changes and ligamentous hypertrophy at the C1-C2 articulation.   Sinuses/Orbits: No mass or acute finding within the imaged orbits. Prior right ocular lens replacement. Mild mucosal thickening within the bilateral ethmoid and right sphenoid sinuses.   Other: Small-volume fluid within the right mastoid air cells.   IMPRESSION: 1. No evidence of acute intracranial abnormality. 2. Moderate chronic small vessel ischemic changes within the cerebral white matter and pons. 3. Mild chronic small-vessel ischemic changes within the thalami. 4. Mild-to-moderate generalized cerebral atrophy. Comparatively mild cerebellar atrophy. 5. Mild mucosal thickening within the bilateral ethmoid and right sphenoid sinuses. 6. Small-volume fluid within the right mastoid air cells.     Electronically Signed   By: Jackey Loge D.O.   On: 11/11/2021 17:53    COGNITION: Overall cognitive status: Impaired   SENSATION: Light touch: Impaired   COORDINATION: Some difficulty sequencing   POSTURE: forward head   LOWER EXTREMITY MMT:    MMT Right Eval Left Eval   Hip flexion 3+ 4  Hip extension 4 4  Hip abduction 4 4  Hip adduction 4 4  Hip internal rotation 4 4  Hip external rotation 4 4  Knee flexion 4 4  Knee extension 4 4  Ankle dorsiflexion    Ankle plantarflexion    Ankle  inversion    Ankle eversion    (Blank rows = not tested)    TRANSFERS: Assistive device utilized: None  Sit to stand: Complete Independence Stand to sit: Complete Independence Chair to chair: Complete Independence Floor:  Not tested  GAIT: Gait pattern: step through pattern, decreased arm swing- Right, decreased arm swing- Left, decreased step length- Right, and decreased step length- Left Distance walked: 100+ feet Assistive device utilized: None Level of assistance: SBA Comments: Shuffling gait  FUNCTIONAL TESTS:  5 times sit to stand: 18.48 sec without UE Support  Timed up and go (TUG): 16.42 sec without UE support 6 minute walk test: To be tested 2nd visit 10 meter walk test: 14.84 and 14.62 sec  = 0.69 m/s avg without UE support Berg Balance Scale: 43/56  PATIENT SURVEYS:  FOTO 49 with goal of 3  TODAY'S TREATMENT:                                                                                                                              DATE: 08/03/2023    Physical therapy treatment session today consisted of completing assessment of goals and administration of testing as demonstrated and documented in flow sheet, treatment, and goals section of this note. Addition treatments may be found below.   BERG:    [facility] PT Assessment - 08/15/23 1042       Berg Balance Test   Sit to Stand Able to stand without using hands and stabilize independently    Standing Unsupported Able to stand safely 2 minutes    Sitting with Back Unsupported but Feet Supported on Floor or Stool Able to sit safely and securely 2 minutes    Stand to Sit Sits safely with minimal use of hands    Transfers Able to transfer safely, minor use of hands    Standing Unsupported  with Eyes Closed Able to stand 10 seconds with supervision    Standing Unsupported with Feet Together Able to place feet together independently and stand 1 minute safely    From Standing, Reach Forward with Outstretched Arm Can reach forward >12 cm safely (5")    From Standing Position, Pick up Object from Floor Able to pick up shoe, needs supervision    From Standing Position, Turn to Look Behind Over each Shoulder Looks behind from both sides and weight shifts well    Turn 360 Degrees Able to turn 360 degrees safely but slowly    Standing Unsupported, Alternately Place Feet on Step/Stool Able to stand independently and safely and complete 8 steps in 20 seconds    Standing Unsupported, One Foot in Front Able to plae foot ahead of the other independently and hold 30 seconds    Standing on One Leg Able to lift leg independently and hold 5-10 seconds    Total Score 49                  PATIENT EDUCATION: Education details:  Purpose of PT and plan of care Person educated: Patient Education method: Explanation Education comprehension: verbalized understanding  HOME EXERCISE PROGRAM:   GOALS: Goals reviewed with patient? Yes  SHORT TERM GOALS: Target date: 04/05/2023  Pt will be independent with HEP in order to improve strength and balance in order to decrease fall risk and improve function at home and work.   Baseline: EVAL- No formal HEP In place; 04/11/2023- patient reports walking daily and attempting not to shuffle and going to [place] steady. 06/22/2023- Patient reports inconsistent with HEP due to Holidays and traveling. 08/15/2023 = Patient reports no issues with HEP except consistent compliance but states good understanding of HEP.  Goal status: MET   LONG TERM GOALS: Target date: 09/14/2023  1.  Patient (> 68 years old) will complete five times sit to stand test in < 15 seconds indicating an increased LE strength and improved balance. Baseline: EVAL= 18.48 sec without UE  support; 04/11/2023= 14.94 sec without UE support. 06/22/2023= 16.54 sec without UE support; 08/15/2023= 14.41 Sec without UE support Goal status: MET  2.  Patient will increase FOTO score to equal to or greater than  59   to demonstrate statistically significant improvement in mobility and quality of life.  Baseline: EVAL=49; 04/11/2023= 59; 06/22/2023=64 Goal status: MET   3.  Patient will increase Berg Balance score by > 6 points to demonstrate decreased fall risk during functional activities. Baseline: EVAL=43/56; 06/22/2023=44/56; 08/15/2023= 49 Goal status: PROGRESSING   4.  Patient will reduce timed up and go to <11 seconds to reduce fall risk and demonstrate improved transfer/gait ability. Baseline: EVAL=16.42; 04/11/2023= 10 sec without UE support Goal status: MET  5.  Patient will increase 10 meter walk test to >1.57m/s as to improve gait speed for better community ambulation and to reduce fall risk. Baseline: EVAL= 0.69; 04/11/2023= 1.0 m/s without AD. 06/22/2023=0.82 m/s; 08/15/2023= 0.97 m/s with use of upright 4WW Goal status: PROGRESSING  6. Patient will increase six minute walk test distance to >1000 for progression to community ambulator and improve gait ability Baseline:03/08/23: 575 ft with progressive shuffling and flexion posture throughout. 04/11/2023= 925 feet with some festinating gait- worse with increased distance.  07/13/2023= 1005 feet; 08/15/2023= 1060 feet with upright 4WW-  Goal status: MET 7.   Patient will reduce timed up and go to <10 seconds to reduce fall risk and demonstrate improved transfer/gait ability. Baseline:  06/22/2023= 13.41 sec Goal status: ONGOING   ASSESSMENT:  CLINICAL IMPRESSION: Patient presents with good motivation for today's progress note visit. He presents with improved functional LE strength meeting his 5 x STS goal. He is demonstrating steady progress with balance as seen by consistent improved BERG balance test scores. He also met his 6 min  walk test using a upright 4WW and PT has continued to recommend patient obtain from [place] if possible. Patient's condition has the potential to improve in response to therapy. Maximum improvement is yet to be obtained. The anticipated improvement is attainable and reasonable in a generally predictable time.   Pt will continue to benefit from skilled physical therapy intervention to address impairments, improve QOL, and attain therapy goals.    OBJECTIVE IMPAIRMENTS: Abnormal gait, decreased activity tolerance, decreased balance, decreased cognition, decreased coordination, decreased endurance, decreased mobility, difficulty walking, decreased ROM, decreased strength, hypomobility, and postural dysfunction.   ACTIVITY LIMITATIONS: carrying, lifting, bending, standing, squatting, and stairs  PARTICIPATION LIMITATIONS: cleaning, shopping, community activity, and yard work  PERSONAL FACTORS: Age and 3+ comorbidities: HTN, Vertigo,  arthritis  are also affecting patient's functional outcome.   REHAB POTENTIAL: Good  CLINICAL DECISION MAKING: Stable/uncomplicated  EVALUATION COMPLEXITY: Moderate  PLAN:  PT FREQUENCY: 1-2x/week  PT DURATION: 12 weeks  PLANNED INTERVENTIONS: Therapeutic exercises, Therapeutic activity, Neuromuscular re-education, Balance training, Gait training, Patient/Family education, Self Care, Joint mobilization, Joint manipulation, Stair training, Vestibular training, Canalith repositioning, Orthotic/Fit training, DME instructions, Dry Needling, Spinal manipulation, Spinal mobilization, Cryotherapy, Moist heat, Taping, Manual therapy, and Re-evaluation  PLAN FOR NEXT SESSION:    Progress balance and LE strengthening - add to HEP as appropriate. Continue with safety training with all mobility.      Louis Meckel, PT  08/15/2023, 11:21 PM

## 2023-08-15 ENCOUNTER — Ambulatory Visit: Payer: Medicare Other

## 2023-08-15 DIAGNOSIS — M6281 Muscle weakness (generalized): Secondary | ICD-10-CM | POA: Diagnosis not present

## 2023-08-15 DIAGNOSIS — R2681 Unsteadiness on feet: Secondary | ICD-10-CM

## 2023-08-15 DIAGNOSIS — R262 Difficulty in walking, not elsewhere classified: Secondary | ICD-10-CM

## 2023-08-15 DIAGNOSIS — R269 Unspecified abnormalities of gait and mobility: Secondary | ICD-10-CM

## 2023-08-15 DIAGNOSIS — R278 Other lack of coordination: Secondary | ICD-10-CM

## 2023-08-15 DIAGNOSIS — G20B1 Parkinson's disease with dyskinesia, without mention of fluctuations: Secondary | ICD-10-CM

## 2023-08-15 DIAGNOSIS — R2689 Other abnormalities of gait and mobility: Secondary | ICD-10-CM

## 2023-08-17 ENCOUNTER — Ambulatory Visit: Payer: Medicare Other

## 2023-08-22 ENCOUNTER — Ambulatory Visit: Payer: Medicare Other | Attending: Neurology

## 2023-08-22 DIAGNOSIS — R2681 Unsteadiness on feet: Secondary | ICD-10-CM | POA: Insufficient documentation

## 2023-08-22 DIAGNOSIS — R262 Difficulty in walking, not elsewhere classified: Secondary | ICD-10-CM | POA: Insufficient documentation

## 2023-08-22 DIAGNOSIS — R269 Unspecified abnormalities of gait and mobility: Secondary | ICD-10-CM | POA: Insufficient documentation

## 2023-08-22 DIAGNOSIS — M6281 Muscle weakness (generalized): Secondary | ICD-10-CM | POA: Insufficient documentation

## 2023-08-24 ENCOUNTER — Ambulatory Visit: Payer: Medicare Other

## 2023-08-24 ENCOUNTER — Telehealth: Payer: Self-pay

## 2023-08-24 NOTE — Telephone Encounter (Signed)
 Patient Name: Carlos Mathis MRN: [id number] DOB:1940-09-28, 83 y.o., male Today's Date: 08/24/2023  Phone call to patient as he missed his scheduled appointment. His wife answered phone stating he missed as he didn't think he had more visits approved. Discussed that he has 1 visit left as of now and attempting to obtain more through [place]. His wife said he would be at next scheduled visit on 08/29/2023.    Lenda Kelp, PT 08/24/2023, 10:38 AM

## 2023-08-29 ENCOUNTER — Ambulatory Visit: Payer: Medicare Other

## 2023-08-29 DIAGNOSIS — R2681 Unsteadiness on feet: Secondary | ICD-10-CM | POA: Diagnosis present

## 2023-08-29 DIAGNOSIS — M6281 Muscle weakness (generalized): Secondary | ICD-10-CM

## 2023-08-29 DIAGNOSIS — R269 Unspecified abnormalities of gait and mobility: Secondary | ICD-10-CM

## 2023-08-29 DIAGNOSIS — R262 Difficulty in walking, not elsewhere classified: Secondary | ICD-10-CM

## 2023-08-29 NOTE — Therapy (Signed)
 OUTPATIENT PHYSICAL THERAPY NEURO TREATMENT/PT DISCHARGE  Patient Name: Carlos Mathis MRN: [id number] DOB:Feb 21, 1941, 83 y.o., male Today's Date: 08/29/2023   PCP: Dr. Einar Crow REFERRING PROVIDER: Dr. Cristopher Peru  END OF SESSION:  PT End of Session - 08/29/23 1124     Visit Number 31    Number of Visits 44    Date for PT Re-Evaluation 09/14/23    Progress Note Due on Visit 30    PT Start Time 1020    PT Stop Time 1053    PT Time Calculation (min) 33 min    Equipment Utilized During Treatment Gait belt    Activity Tolerance Patient tolerated treatment well;No increased pain    Behavior During Therapy WFL for tasks assessed/performed                         Past Medical History:  Diagnosis Date   Arthritis    BPH (benign prostatic hyperplasia)    CKD (chronic kidney disease), stage III (HCC)    Coronary artery disease    GERD (gastroesophageal reflux disease)    History of hiatal hernia    HLD (hyperlipidemia)    Hypertension    Long term current use of antithrombotics/antiplatelets    a.) DAPT therapy (ASA+ ticagrelor)   MCI (mild cognitive impairment) with memory loss    a.) takes apoaequorin   Migraines    OSA on CPAP    Pneumonia    Skin cancer, basal cell    STEMI (ST elevation myocardial infarction) (HCC) 07/27/2020   a.) MI while living in [place]; underwent PCI placing 4.0 x 18 mm and 3.5 x 38 mm Resolute Onyx DES (vessels unknown/unspecified).   T2DM (type 2 diabetes mellitus) (HCC)    Vertigo    Wears dentures    Full upper, partial lower   Wears hearing aid in both ears    Past Surgical History:  Procedure Laterality Date   CARDIAC CATHETERIZATION     CATARACT EXTRACTION Right    CATARACT EXTRACTION W/PHACO Left 05/03/2022   Procedure: CATARACT EXTRACTION PHACO AND INTRAOCULAR LENS PLACEMENT (IOC) LEFT DIABETIC;  Surgeon: Galen Manila, MD;  Location: [facility];  Service: Ophthalmology;  Laterality: Left;   14.77 1:24.0   ESOPHAGOGASTRODUODENOSCOPY (EGD) WITH PROPOFOL N/A 03/21/2023   Procedure: ESOPHAGOGASTRODUODENOSCOPY (EGD) WITH PROPOFOL;  Surgeon: [name], MD;  [facility];  Service: Gastroenterology;  Laterality: N/A;   HERNIA REPAIR Left    inguinal   INSERTION OF MESH  09/01/2021   Procedure: INSERTION OF MESH;  Surgeon: Carolan Shiver, MD;  Location: [facility] ORS;  Service: General;;   JOINT REPLACEMENT     tka   UMBILICAL HERNIA REPAIR     Patient Active Problem List   Diagnosis Date Noted   Acute myocardial infarction, unspecified (HCC) 11/16/2022   Anxiety state 11/16/2022   Balanitis 11/16/2022   Benign paroxysmal positional vertigo 11/16/2022   Cardiac dysrhythmia 11/16/2022   Chronic ischemic heart disease 11/16/2022   Chronic sinusitis 11/16/2022   Gastroesophageal reflux disease 11/16/2022   Headache 11/16/2022   Counseling, unspecified 11/16/2022   History of occlusive disease of artery of lower extremity 11/16/2022   Hypertensive chronic kidney disease with stage 1 through stage 4 chronic kidney disease, or unspecified chronic kidney disease 11/16/2022   Irritable bowel syndrome 11/16/2022   Unspecified dementia, mild, without behavioral disturbance, psychotic disturbance, mood disturbance, and anxiety (HCC) 11/16/2022   Obstructive sleep apnea (adult) (pediatric) 11/16/2022   Osteoarthritis  of knee 11/16/2022   Personal history of tuberculosis 11/16/2022   Phimosis 11/16/2022   Restless legs 11/16/2022   Sensorineural hearing loss, bilateral 11/16/2022   Type 2 diabetes mellitus without complications (HCC) 11/16/2022   Diabetic polyneuropathy associated with type 2 diabetes mellitus (HCC) 06/24/2021   Healthcare maintenance 06/24/2021   CAD (coronary artery disease) 09/02/2020   Pure hypercholesterolemia 09/02/2020   Polycystic kidney 02/11/2020   Benign prostatic hyperplasia 02/11/2020   Essential hypertension 02/11/2020   Acquired cyst of  kidney 07/09/2019   Spinal stenosis of lumbar region 05/30/2019   Cervical spondylosis 03/12/2019   Dysphagia 03/26/2015   Hypermetropia 02/04/2015   Presbyopia 02/04/2015   Senile nuclear cataract 02/04/2015   Epistaxis 09/23/2014   Laryngopharyngeal reflux (LPR) 09/23/2014    ONSET DATE: worse since Jan 2024  REFERRING DIAG:  Diagnosis  G20.A1 (ICD-10-CM) - Parkinson's disease    THERAPY DIAG:  Muscle weakness (generalized)  Difficulty in walking, not elsewhere classified  Abnormality of gait and mobility  Unsteadiness on feet  Rationale for Evaluation and Treatment: Rehabilitation  SUBJECTIVE:                                                                                                                                                                                             SUBJECTIVE STATEMENT: Patient reports he had another fall yesterday walking without a device in the hospital visiting a friend. Reports just turned and couldn't catch his balance.      Pt accompanied by: self  PERTINENT HISTORY:   Per neurology note submitted in chart by Janice Coffin, PA.  1. 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 Memory loss starting 2018 with cognitive decline over 2022. Patient reports difficulty remembering conversations, difficulty recognizing old friends, getting lost while driving. Per spouse patient can become aggressive behind the wheel. Patient reports visual hallucinations (dark areas out of his periphery) and audio hallucinations. Wife assists with medications and finances. Has become more emotional over the last year and sometimes becomes angry with spouse which is unusual. Denies alcohol use or difficulty sleeping. Per wife significant procrastiantion, low motivation, more hunched  forward posture, balance issues, and gets going walking and has a difficult time stopping.   Patient was cleared to drive per driving assessment, however, we discussed that memory may not keep up with the physical ability to drive if condition continues to get worse   Ambulatory Referral to Physical Therapy [facility])   2. Peripheral neuropathy- decreased sensation with  some pain in the feet in a patient with history of agent orange exposure.  3. Orthostatic Lightheadedness/Vertigo, chronic episodic vertigo  Orthostatic hypotension (neurogenic) can be seen in many conditions such as parkinsonism, diabetic neuropathy, pure autonomic failure etc. Patient should have work up to rule out other causes first such as dehydration, cardiac issues, iatrogenic due to meds etc. Lightheadedness, fainting, fatigue, blurry vision, weakness etc can happen as symptoms. Patient should drink plenty of water, wear compression stockings, exercise, liberal salts, elevate the head end of the bed etc. Fludrocortisone, Midodrine, droxidopa etc. Can be tried in certain patients.   PAIN:  Are you having pain? No  PRECAUTIONS: Fall  RED FLAGS: None   WEIGHT BEARING RESTRICTIONS: No  FALLS: Has patient fallen in last 6 months? No  LIVING ENVIRONMENT: Lives with: Wife Lives in: House/apartment Stairs: Yes: External: 5 steps; can reach both Has following equipment at home: Single point cane and Walker - 2 wheeled  PLOF: Independent  PATIENT GOALS: I want to be stronger and more steady  OBJECTIVE:   DIAGNOSTIC FINDINGS: CLINICAL DATA:  Provided history: Mild cognitive impairment with memory loss.   EXAM: MRI HEAD WITHOUT CONTRAST   TECHNIQUE: Multiplanar, multiecho pulse sequences of the brain and surrounding structures were obtained without intravenous contrast.   COMPARISON:  Head CT 07/31/2021.   FINDINGS: Brain:   Mild-to-moderate generalized cerebral atrophy. Commensurate prominence of  the ventricles and sulci. Comparatively mild cerebellar atrophy.   Multifocal T2 FLAIR hyperintense signal abnormality within the cerebral white matter and pons, nonspecific but compatible with moderate chronic small vessel ischemic disease.   Mild chronic small vessel ischemic changes within the thalami.   There is no acute infarct.   No evidence of an intracranial mass.   No chronic intracranial blood products.   No extra-axial fluid collection.   No midline shift.   Vascular: Maintained flow voids within the proximal large arterial vessels.   Skull and upper cervical spine: No focal suspicious marrow lesion. Degenerative changes and ligamentous hypertrophy at the C1-C2 articulation.   Sinuses/Orbits: No mass or acute finding within the imaged orbits. Prior right ocular lens replacement. Mild mucosal thickening within the bilateral ethmoid and right sphenoid sinuses.   Other: Small-volume fluid within the right mastoid air cells.   IMPRESSION: 1. No evidence of acute intracranial abnormality. 2. Moderate chronic small vessel ischemic changes within the cerebral white matter and pons. 3. Mild chronic small-vessel ischemic changes within the thalami. 4. Mild-to-moderate generalized cerebral atrophy. Comparatively mild cerebellar atrophy. 5. Mild mucosal thickening within the bilateral ethmoid and right sphenoid sinuses. 6. Small-volume fluid within the right mastoid air cells.     Electronically Signed   By: Jackey Loge D.O.   On: 11/11/2021 17:53    COGNITION: Overall cognitive status: Impaired   SENSATION: Light touch: Impaired   COORDINATION: Some difficulty sequencing   POSTURE: forward head   LOWER EXTREMITY MMT:    MMT Right Eval Left Eval  Hip flexion 3+ 4  Hip extension 4 4  Hip abduction 4 4  Hip adduction 4 4  Hip internal rotation 4 4  Hip external rotation 4 4  Knee flexion 4 4  Knee extension 4 4  Ankle dorsiflexion    Ankle  plantarflexion    Ankle inversion    Ankle eversion    (Blank rows = not tested)    TRANSFERS: Assistive device utilized: None  Sit to stand: Complete Independence Stand to sit: Complete Independence Chair to chair:  Complete Independence Floor:  Not tested  GAIT: Gait pattern: step through pattern, decreased arm swing- Right, decreased arm swing- Left, decreased step length- Right, and decreased step length- Left Distance walked: 100+ feet Assistive device utilized: None Level of assistance: SBA Comments: Shuffling gait  FUNCTIONAL TESTS:  5 times sit to stand: 18.48 sec without UE Support  Timed up and go (TUG): 16.42 sec without UE support 6 minute walk test: To be tested 2nd visit 10 meter walk test: 14.84 and 14.62 sec  = 0.69 m/s avg without UE support Berg Balance Scale: 43/56  PATIENT SURVEYS:  FOTO 49 with goal of 12  TODAY'S TREATMENT:                                                                                                                              DATE: 08/03/2023    Physical therapy treatment session today consisted of completing assessment of goals and administration of testing as demonstrated and documented in flow sheet, treatment, and goals section of this note. Addition treatments may be found below.    PT instructed pt in TUG: 11.9 sec without AD  (average of 2 trials; >13.5 sec indicates increased fall risk)   10 Meter Walk Test: Patient instructed to walk 10 meters (32.8 ft) as quickly and as safely as possible at their normal speed x2 and at a fast speed x2. Time measured from 2 meter mark to 8 meter mark to accommodate ramp-up and ramp-down.  Normal speed 1: 0.83 m/s using U-step walker (mild forward flexed posture Looking at laser) Normal speed 2: 0.83 m/s using U-step walker Average Normal speed: 083 m/s Cut off scores: <0.4 m/s = household Ambulator, 0.4-0.8 m/s = limited community Ambulator, >0.8 m/s = community Ambulator, >1.2 m/s =  crossing a street, <1.0 = increased fall risk MCID 0.05 m/s (small), 0.13 m/s (moderate), 0.06 m/s (significant)  (ANPTA Core Set of Outcome Measures for Adults with Neurologic Conditions, 2018)    BERG:    [facility] PT Assessment - 08/29/23 1051       Berg Balance Test   Sit to Stand Able to stand without using hands and stabilize independently    Standing Unsupported Able to stand safely 2 minutes    Sitting with Back Unsupported but Feet Supported on Floor or Stool Able to sit safely and securely 2 minutes    Stand to Sit Sits safely with minimal use of hands    Transfers Able to transfer safely, minor use of hands    Standing Unsupported with Eyes Closed Able to stand 10 seconds safely    Standing Unsupported with Feet Together Able to place feet together independently and stand 1 minute safely    From Standing, Reach Forward with Outstretched Arm Can reach forward >12 cm safely (5")    From Standing Position, Pick up Object from Floor Able to pick up shoe, needs supervision    From Standing Position, Turn to  Look Behind Over each Shoulder Looks behind one side only/other side shows less weight shift    Turn 360 Degrees Able to turn 360 degrees safely but slowly    Standing Unsupported, Alternately Place Feet on Step/Stool Able to stand independently and safely and complete 8 steps in 20 seconds    Standing Unsupported, One Foot in Front Able to place foot tandem independently and hold 30 seconds    Standing on One Leg Able to lift leg independently and hold 5-10 seconds    Total Score 50                   PATIENT EDUCATION: Education details: Purpose of PT and plan of care Person educated: Patient Education method: Explanation Education comprehension: verbalized understanding  HOME EXERCISE PROGRAM:   GOALS: Goals reviewed with patient? Yes  SHORT TERM GOALS: Target date: 04/05/2023  Pt will be independent with HEP in order to improve strength and balance in order  to decrease fall risk and improve function at home and work.   Baseline: EVAL- No formal HEP In place; 04/11/2023- patient reports walking daily and attempting not to shuffle and going to [place] steady. 06/22/2023- Patient reports inconsistent with HEP due to Holidays and traveling. 08/15/2023 = Patient reports no issues with HEP except consistent compliance but states good understanding of HEP.  Goal status: MET   LONG TERM GOALS: Target date: 09/14/2023  1.  Patient (> 60 years old) will complete five times sit to stand test in < 15 seconds indicating an increased LE strength and improved balance. Baseline: EVAL= 18.48 sec without UE support; 04/11/2023= 14.94 sec without UE support. 06/22/2023= 16.54 sec without UE support; 08/15/2023= 14.41 Sec without UE support Goal status: MET  2.  Patient will increase FOTO score to equal to or greater than  59   to demonstrate statistically significant improvement in mobility and quality of life.  Baseline: EVAL=49; 04/11/2023= 59; 06/22/2023=64 Goal status: MET   3.  Patient will increase Berg Balance score by > 6 points to demonstrate decreased fall risk during functional activities. Baseline: EVAL=43/56; 06/22/2023=44/56; 08/15/2023= 49; 08/29/2023= 50/56 Goal status: PROGRESSING   4.  Patient will reduce timed up and go to <11 seconds to reduce fall risk and demonstrate improved transfer/gait ability. Baseline: EVAL=16.42; 04/11/2023= 10 sec without UE support Goal status: MET  5.  Patient will increase 10 meter walk test to >1.29m/s as to improve gait speed for better community ambulation and to reduce fall risk. Baseline: EVAL= 0.69; 04/11/2023= 1.0 m/s without AD. 06/22/2023=0.82 m/s; 08/15/2023= 0.97 m/s with use of upright 4WW; 08/29/2023= 0. 83 with U-step walker Goal status: PROGRESSING  6. Patient will increase six minute walk test distance to >1000 for progression to community ambulator and improve gait ability Baseline:03/08/23: 575 ft with progressive  shuffling and flexion posture throughout. 04/11/2023= 925 feet with some festinating gait- worse with increased distance.  07/13/2023= 1005 feet; 08/15/2023= 1060 feet with upright 4WW-  Goal status: MET 7.   Patient will reduce timed up and go to <10 seconds to reduce fall risk and demonstrate improved transfer/gait ability. Baseline:  06/22/2023= 13.41 sec; 08/29/2023= 11.9 sec without AD Goal status: Progressing    ASSESSMENT:  CLINICAL IMPRESSION:  Patient presents with good motivation for last planned PT session. He presents with continued festinating shuffle without AD. States the [place] recommended a U-step walker and order one and he states he will plan to use it. Reassessed all remaining goals- Patient continued to  make steady gains overall- improved with BERG, TUG yet slightly decreased 10 MWT with U-step walker and more forward flexed posture. Continued to recommend Upright walker for max safety but instructed that any walker is better than no device. He verbalized understanding. He is appropriate at this time with majority of goals met and patient to continue with his HEP and community [place] steady boxing class.    OBJECTIVE IMPAIRMENTS: Abnormal gait, decreased activity tolerance, decreased balance, decreased cognition, decreased coordination, decreased endurance, decreased mobility, difficulty walking, decreased ROM, decreased strength, hypomobility, and postural dysfunction.   ACTIVITY LIMITATIONS: carrying, lifting, bending, standing, squatting, and stairs  PARTICIPATION LIMITATIONS: cleaning, shopping, community activity, and yard work  PERSONAL FACTORS: Age and 3+ comorbidities: HTN, Vertigo, arthritis  are also affecting patient's functional outcome.   REHAB POTENTIAL: Good  CLINICAL DECISION MAKING: Stable/uncomplicated  EVALUATION COMPLEXITY: Moderate  PLAN:  PT FREQUENCY: 1-2x/week  PT DURATION: 12 weeks  PLANNED INTERVENTIONS: Therapeutic exercises, Therapeutic activity,  Neuromuscular re-education, Balance training, Gait training, Patient/Family education, Self Care, Joint mobilization, Joint manipulation, Stair training, Vestibular training, Canalith repositioning, Orthotic/Fit training, DME instructions, Dry Needling, Spinal manipulation, Spinal mobilization, Cryotherapy, Moist heat, Taping, Manual therapy, and Re-evaluation  PLAN FOR NEXT SESSION:    Discharge patient today with majority of goals met.       Louis Meckel, PT  08/29/2023, 11:27 AM

## 2023-08-31 ENCOUNTER — Ambulatory Visit: Payer: Medicare Other

## 2023-09-05 ENCOUNTER — Ambulatory Visit: Payer: Medicare Other

## 2023-09-07 ENCOUNTER — Ambulatory Visit: Payer: Medicare Other

## 2023-09-12 ENCOUNTER — Ambulatory Visit: Payer: Medicare Other

## 2023-09-14 ENCOUNTER — Ambulatory Visit: Payer: Medicare Other

## 2023-09-19 ENCOUNTER — Ambulatory Visit: Payer: Medicare Other

## 2023-09-21 ENCOUNTER — Ambulatory Visit: Payer: Medicare Other

## 2023-09-26 ENCOUNTER — Ambulatory Visit: Payer: Medicare Other

## 2023-09-28 ENCOUNTER — Ambulatory Visit: Payer: Medicare Other

## 2023-10-03 ENCOUNTER — Ambulatory Visit: Payer: Medicare Other

## 2023-10-05 ENCOUNTER — Ambulatory Visit: Payer: Medicare Other

## 2023-10-10 ENCOUNTER — Ambulatory Visit: Payer: Medicare Other

## 2023-10-12 ENCOUNTER — Ambulatory Visit: Payer: Medicare Other

## 2023-10-17 ENCOUNTER — Ambulatory Visit: Payer: Medicare Other

## 2023-10-19 ENCOUNTER — Ambulatory Visit: Payer: Medicare Other

## 2023-10-24 ENCOUNTER — Ambulatory Visit: Payer: Medicare Other

## 2023-10-26 ENCOUNTER — Ambulatory Visit: Payer: Medicare Other

## 2023-10-31 ENCOUNTER — Ambulatory Visit: Payer: Medicare Other

## 2023-11-02 ENCOUNTER — Ambulatory Visit: Payer: Medicare Other

## 2023-11-07 ENCOUNTER — Ambulatory Visit: Payer: Medicare Other

## 2023-11-09 ENCOUNTER — Ambulatory Visit: Payer: Medicare Other

## 2023-11-14 ENCOUNTER — Ambulatory Visit: Payer: Medicare Other

## 2023-11-16 ENCOUNTER — Ambulatory Visit: Payer: Medicare Other

## 2023-11-21 ENCOUNTER — Ambulatory Visit: Payer: Medicare Other

## 2023-11-23 ENCOUNTER — Ambulatory Visit: Payer: Medicare Other

## 2023-11-28 ENCOUNTER — Ambulatory Visit: Payer: Medicare Other

## 2023-11-30 ENCOUNTER — Ambulatory Visit: Payer: Medicare Other

## 2023-12-05 ENCOUNTER — Ambulatory Visit: Payer: Medicare Other

## 2023-12-07 ENCOUNTER — Ambulatory Visit: Payer: Medicare Other

## 2023-12-12 ENCOUNTER — Ambulatory Visit: Payer: Medicare Other

## 2023-12-14 ENCOUNTER — Ambulatory Visit: Payer: Medicare Other

## 2023-12-19 ENCOUNTER — Ambulatory Visit: Payer: Medicare Other

## 2023-12-21 ENCOUNTER — Ambulatory Visit: Payer: Medicare Other

## 2023-12-26 ENCOUNTER — Ambulatory Visit: Payer: Medicare Other

## 2023-12-28 ENCOUNTER — Ambulatory Visit: Payer: Medicare Other

## 2024-01-02 ENCOUNTER — Ambulatory Visit: Payer: Medicare Other

## 2024-01-04 ENCOUNTER — Ambulatory Visit: Payer: Medicare Other

## 2024-01-09 ENCOUNTER — Ambulatory Visit: Payer: Medicare Other

## 2024-01-11 ENCOUNTER — Ambulatory Visit: Payer: Medicare Other

## 2024-03-25 ENCOUNTER — Other Ambulatory Visit: Payer: Self-pay | Admitting: Internal Medicine

## 2024-03-25 DIAGNOSIS — R1310 Dysphagia, unspecified: Secondary | ICD-10-CM

## 2024-04-02 ENCOUNTER — Ambulatory Visit
Admission: RE | Admit: 2024-04-02 | Discharge: 2024-04-02 | Disposition: A | Discharge status: Home / Self Care | Source: Ambulatory Visit | Attending: Internal Medicine | Admitting: Internal Medicine

## 2024-04-02 ENCOUNTER — Other Ambulatory Visit: Payer: Self-pay | Admitting: Internal Medicine

## 2024-04-02 DIAGNOSIS — R1310 Dysphagia, unspecified: Secondary | ICD-10-CM

## 2024-06-24 NOTE — Progress Notes (Signed)
 Established Patient Visit   Chief Complaint: Chief Complaint  Patient presents with   Follow-up    3 month    Date of Service: 06/24/2024 Date of Birth: May 08, 1941 PCP: Administration, [employer]  History of Present Illness: Carlos Mathis is a 84 y.o.male patient who returns for   1.  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  PCI with 4.0 x 18 mm Resolute Onyx stent LCx 07/27/2020  3.  Staged PCI 3.5 x 38 mm Resolute Onyx stent LAD 07/29/2020   4.  Essential hypertension   5.  Hyperlipidemia   6.  Type 2 diabetes   7.  Parkinson's  8.  Paroxysmal atrial fibrillation  9.  Frequent premature atrial contractions  Patient initially referred to establish cardiovascular care 09/02/2020.  The patient was in the process of moving from [place]  back to [facility] .  He experienced ST elevation myocardial infarction on 07/27/2020 and underwent primary PCI with 4.0 x 18 mm Resolute Onyx stent left circumflex, and staged PCI with 3.5 x 38 mm Resolute Onyx stent LAD on 07/29/2020.  The echocardiogram 07/27/2020 revealed LVEF 40-45%.  The patient was discharged home on aspirin and Brilinta.  The patient returns today for unscheduled visit.  He denies exertional chest pain.  He reports intermittent episodes of left-sided chest discomfort, which occur with and without exertion, comes and goes, typically last 10 to 15 minutes, described as as indigestion like, which has been on and off for you year.  He reports mild, chronic exertional dyspnea.  He denies palpitations or heart racing.  He reports mild intermittent peripheral edema.  The patient is active, goes to the gym 3 days a week, at which time he does not experience chest pain.  ECG 05/08/2024 revealed sinus rhythm with first-degree AV block.  Patient was recently diagnosed with Parkinson's, recently started on carbidopa levodopa.  ECG on 11/06/2023 revealed atrial fibrillation at a rate of 99 bpm.  7-day Holter monitor 11/06/2023 - 11/12/2023  revealed predominant sinus rhythm, mean heart rate 84 bpm, heart rate range 59 to 133 bpm, frequent premature atrial contractions (14%), infrequent premature ventricular contractions, and infrequent brief runs of SVT, longest lasting 5 beats.  The patient has paroxysmal atrial fibrillation, CHA2DS2-VASc score 5, on Eliquis for stroke prevention, which is tolerated well without apparent bleeding side effects.   The patient has essential hypertension, blood pressure well-controlled on losartan which is well-tolerated without apparent side effects.  Blood pressure diary reveals systolic blood pressures 9-1 21 and diastolic blood pressure 66-80.  Heart rates 81-113 bpm.  The patient follows a low-sodium, no added salt diet.   The patient has hyperlipidemia, on high intensity atorvastatin, which is well-tolerated without apparent side effects.  The patient follows a low-cholesterol, low-fat diet.   The patient has type 2 diabetes, on Metformin, empagliflozin, glipizide, and recently started on Ozempic, which are well-tolerated without apparent side effects.  The patient reports urinary incontinence, wears a diaper, with resultant intermittent diaper rash.  The patient follows a low carbohydrate, ADA diet.    Past Medical and Surgical History  Past Medical History Past Medical History:  Diagnosis Date   Diabetes (CMS/HHS-HCC)    History of myocardial infarction    Hypertension     Past Surgical History He has a past surgical history that includes Cardiac catheterization; Replacement total knee (Right, 03/2013); and Inguinal hernia repair (Right, 09/01/2021).   Medications and Allergies  Current Medications  Current Outpatient Medications  Medication Sig Dispense Refill  apixaban (ELIQUIS) 5 mg tablet Take 1 tablet (5 mg total) by mouth every 12 (twelve) hours 60 tablet 5   aspirin 81 MG EC tablet Take 81 mg by mouth once daily     atorvastatin (LIPITOR) 80 MG tablet Take 80 mg by mouth  once daily     betamethasone dipropionate (DIPROSONE) 0.05 % ointment Apply topically 2 (two) times daily as needed     blood glucose diagnostic (GLUCOSE BLOOD) test strip USE 1 STRIP FOR TESTING AS DIRECTED EVERY DAY TO TEST BLOOD SUGAR     carbidopa-levodopa (SINEMET) 25-100 mg tablet Take 1 tablet by mouth 3 (three) times daily for 90 days 270 tablet 0   cholecalciferol, vitamin D3, (VITAMIN D3) 125 mcg (5,000 unit) tablet Take 5,000 Units by mouth once daily     cyanocobalamin (VITAMIN B12) 1000 MCG tablet Take 1,000 mcg by mouth once daily     Herbal Supplement Take 1 capsule by mouth once daily Nerve Control 911-290 mg Passion Fruit Herb Powder, 220 mg Marshmallow Root Powder, 200 mg 4:1 Corydalis Lutea, 100 mg 20:1 Prickly Pear, 90 mg [place]  Poppy Seed     magnesium 200 mg Take 250 mg by mouth once daily     meclizine  (ANTIVERT ) 25 mg tablet      metFORMIN (GLUMETZA) 500 MG (MOD) ER tablet Take 2 tablets (1,000 mg total) by mouth once daily (Patient taking differently: Take 500 mg by mouth once daily) 180 tablet 1   metoprolol  SUCCinate (TOPROL -XL) 50 MG XL tablet Take 1 tablet (50 mg total) by mouth once daily 30 tablet 11   mirabegron (MYRBETRIQ) 25 mg ER Tablet Take 25 mg by mouth once daily     multivitamin capsule Take 1 capsule by mouth once daily     nitroGLYcerin (NITROSTAT) 0.4 MG SL tablet Place 1 tablet under the tongue every 5 (five) minutes as needed     omega 3-dha-epa-fish oil 1,200 (144-216) mg Cap Take 2 tablets by mouth once daily     semaglutide (OZEMPIC) 0.25 mg or 0.5 mg(2 mg/1.5 mL) pen injector Inject 0.5 mg subcutaneously once a week     tamsulosin (FLOMAX) 0.4 mg capsule Take 1 capsule by mouth once daily     No current facility-administered medications for this visit.    Allergies: Patient has no known allergies.  Social and Family History  Social History  reports that he has never smoked. He has never used smokeless tobacco. He reports  that he does not drink alcohol and does not use drugs.  Family History No family history on file.  Review of Systems   Review of Systems: The patient denies chest pain, shortness of breath, orthopnea, paroxysmal nocturnal dyspnea, pedal edema, palpitations, heart racing, presyncope, syncope. Review of 12 Systems is negative except as described above.  Physical Examination   Vitals:BP 118/68   Pulse 50   Ht 177.8 cm (5' 10)   Wt 87.1 kg (192 lb)   SpO2 95%   BMI 27.55 kg/m  Ht:177.8 cm (5' 10) Wt:87.1 kg (192 lb) ADJ:Anib surface area is 2.07 meters squared. Body mass index is 27.55 kg/m.  HEENT: Pupils equally reactive to light and accomodation  Neck: Supple without thyromegaly, carotid pulses 2+ Lungs: clear to auscultation bilaterally; no wheezes, rales, rhonchi Heart: Regular rate and rhythm.  No gallops, murmurs or rub Abdomen: soft nontender, nondistended, with normal bowel sounds Extremities: no cyanosis, clubbing, or edema Peripheral Pulses: 2+ in all extremities, 2+ femoral pulses bilaterally  Assessment  84 y.o. male with  1. 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 Essential hypertension   3. Paroxysmal atrial fibrillation (CMS/HHS-HCC)   4. Pure hypercholesterolemia   5. Type 2 diabetes mellitus with stage 3a chronic kidney disease, unspecified whether long term insulin use (CMS/HHS-HCC)   6. SOB (shortness of breath) on exertion    84 year old gentleman referred to establish cardiovascular care, with recent history of NSTEMI 07/27/2020, status post PCI left circumflex, and staged PCI LAD, without exertional chest pain.  The patient has essential hypertension, blood pressure well controlled on current BP medications.  The patient has hyperlipidemia, on high intensity atorvastatin.  The patient has urinary incontinence, wears a diaper with intermittent diaper rash, and is worried about  continuing empagliflozin, which was discontinued during his last office visit.  During the interim, the patient has been started on Ozempic.  ECG 11/06/2023 reviewed reveals atrial fibrillation of unknown duration.  Patient relatively asymptomatic.  Patient started on Eliquis.  7-day Holter monitor 11/06/2023 - 11/12/2023 revealed predominant sinus rhythm with frequent premature atrial contractions and infrequent brief runs of SVT longest lasting 5 beats.  The patient has been diagnosed with Parkinson's, recently started on carbidopa levodopa.  He returns today for unscheduled visit for left-sided chest discomfort which sounds noncardiac in nature.  ECG reveals sinus rhythm with first-degree AV block without ischemic ST-T wave changes.   Plan   1.  Continue current medications 2.  Counseled patient about low-sodium diet 3.  DASH diet printed instructions given to the patient 4.  Counseled patient about low-cholesterol diet 5.  Continue atorvastatin for hyper lipid management 6.  Low-fat and cholesterol diet printed instructions given to the patient 7.  Diabetes diet printed instructions given to the patient 8.  Continue Eliquis for stroke prevention 9.  2D echocardiogram 10.  Return to clinic for follow-up in 3 months  Orders Placed This Encounter  Procedures   Echo complete    Return in about 3 months (around 09/22/2024).  Carlos DOOMS, MD PhD [facility]

## 2024-06-30 ENCOUNTER — Emergency Department

## 2024-06-30 ENCOUNTER — Other Ambulatory Visit: Payer: Self-pay

## 2024-06-30 ENCOUNTER — Emergency Department
Admission: EM | Admit: 2024-06-30 | Discharge: 2024-06-30 | Disposition: A | Discharge status: Home / Self Care | Attending: Emergency Medicine | Admitting: Emergency Medicine

## 2024-06-30 DIAGNOSIS — I2510 Atherosclerotic heart disease of native coronary artery without angina pectoris: Secondary | ICD-10-CM | POA: Insufficient documentation

## 2024-06-30 DIAGNOSIS — G20A1 Parkinson's disease without dyskinesia, without mention of fluctuations: Secondary | ICD-10-CM | POA: Insufficient documentation

## 2024-06-30 DIAGNOSIS — T148XXA Other injury of unspecified body region, initial encounter: Secondary | ICD-10-CM

## 2024-06-30 DIAGNOSIS — R109 Unspecified abdominal pain: Secondary | ICD-10-CM | POA: Insufficient documentation

## 2024-06-30 DIAGNOSIS — R059 Cough, unspecified: Secondary | ICD-10-CM | POA: Insufficient documentation

## 2024-06-30 DIAGNOSIS — R067 Sneezing: Secondary | ICD-10-CM | POA: Insufficient documentation

## 2024-06-30 DIAGNOSIS — N189 Chronic kidney disease, unspecified: Secondary | ICD-10-CM | POA: Insufficient documentation

## 2024-06-30 LAB — COMPREHENSIVE METABOLIC PANEL WITH GFR
ALT: 10 U/L (ref 0–44)
AST: 19 U/L (ref 15–41)
Albumin: 4.1 g/dL (ref 3.5–5.0)
Alkaline Phosphatase: 92 U/L (ref 38–126)
Anion gap: 11 (ref 5–15)
BUN: 14 mg/dL (ref 8–23)
CO2: 24 mmol/L (ref 22–32)
Calcium: 9.9 mg/dL (ref 8.9–10.3)
Chloride: 101 mmol/L (ref 98–111)
Creatinine, Ser: 1.22 mg/dL (ref 0.61–1.24)
GFR, Estimated: 59 mL/min — ABNORMAL LOW
Glucose, Bld: 232 mg/dL — ABNORMAL HIGH (ref 70–99)
Potassium: 4.2 mmol/L (ref 3.5–5.1)
Sodium: 136 mmol/L (ref 135–145)
Total Bilirubin: 0.6 mg/dL (ref 0.0–1.2)
Total Protein: 6.6 g/dL (ref 6.5–8.1)

## 2024-06-30 LAB — RESP PANEL BY RT-PCR (RSV, FLU A&B, COVID)  RVPGX2
Influenza A by PCR: NEGATIVE
Influenza B by PCR: NEGATIVE
Resp Syncytial Virus by PCR: NEGATIVE
SARS Coronavirus 2 by RT PCR: NEGATIVE

## 2024-06-30 LAB — CBC
HCT: 44.2 % (ref 39.0–52.0)
Hemoglobin: 14.8 g/dL (ref 13.0–17.0)
MCH: 32.7 pg (ref 26.0–34.0)
MCHC: 33.5 g/dL (ref 30.0–36.0)
MCV: 97.8 fL (ref 80.0–100.0)
Platelets: 155 K/uL (ref 150–400)
RBC: 4.52 MIL/uL (ref 4.22–5.81)
RDW: 11.7 % (ref 11.5–15.5)
WBC: 5.3 K/uL (ref 4.0–10.5)
nRBC: 0 % (ref 0.0–0.2)

## 2024-06-30 LAB — LIPASE, BLOOD: Lipase: 36 U/L (ref 11–51)

## 2024-06-30 MED ORDER — BENZONATATE 100 MG PO CAPS: 100.0000 mg | ORAL_CAPSULE | Freq: Two times a day (BID) | ORAL | 0 refills | Status: AC | PRN

## 2024-06-30 NOTE — ED Triage Notes (Signed)
 Pt comes with c/o not feeling well for few days. Pt states lots of sneezing like never before. Pt sates pain in belly from it now. Pt states regular intake with no issues.  Pt states no N/V/d. Pt states he has had sever headache and using tylenol  to help that.

## 2024-06-30 NOTE — ED Provider Notes (Signed)
 [facility] Provider Note    Event Date/Time   First MD Initiated Contact with Patient 06/30/24 1406     (approximate)   History   Abdominal Pain and Cough   HPI  Jamieson Hetland is a 84 y.o. male with history CKD, CAD, BPH, presenting with abdominal aching when he sneezes.  States that he is receiving for the last several days, also slight cough.  He denies any chest pain or shortness of breath, no fever.  Is congested.  Denies any nausea vomiting or diarrhea.  No abdominal pain at this time.  On his been chart review, he was seen by cardiology in January, has history of CAD NSTEMI in 2022, had an echo done in 22 that showed an EF of 40 to 45%.  His hypertension is controlled.  Does have history of Parkinson's and started on carbidopa levodopa.     Physical Exam   Triage Vital Signs: ED Triage Vitals  Encounter Vitals Group     BP 06/30/24 1314 132/82     Girls Systolic BP Percentile --      Girls Diastolic BP Percentile --      Boys Systolic BP Percentile --      Boys Diastolic BP Percentile --      Pulse Rate 06/30/24 1314 (!) 101     Resp 06/30/24 1314 19     Temp 06/30/24 1314 97.6 F (36.4 C)     Temp src --      SpO2 06/30/24 1314 95 %     Weight 06/30/24 1313 185 lb (83.9 kg)     Height 06/30/24 1313 5' 10 (1.778 m)     Head Circumference --      Peak Flow --      Pain Score 06/30/24 1313 4     Pain Loc --      Pain Education --      Exclude from Growth Chart --     Most recent vital signs: Vitals:   06/30/24 1314  BP: 132/82  Pulse: (!) 101  Resp: 19  Temp: 97.6 F (36.4 C)  SpO2: 95%     General: Awake, no distress.  CV:  Good peripheral perfusion.  Resp:  Normal effort.  No tachypnea or respiratory distress Abd:  No distention.  Soft nontender to palpation Other:  Nontoxic appearing   ED Results / Procedures / Treatments   Labs (all labs ordered are listed, but only abnormal results are displayed) Labs Reviewed   COMPREHENSIVE METABOLIC PANEL WITH GFR - Abnormal; Notable for the following components:      Result Value   Glucose, Bld 232 (*)    GFR, Estimated 59 (*)    All other components within normal limits  RESP PANEL BY RT-PCR (RSV, FLU A&B, COVID)  RVPGX2  LIPASE, BLOOD  CBC      RADIOLOGY On my independent interpretation, x-ray without obvious consolidation   PROCEDURES:  Critical Care performed: No  Procedures   MEDICATIONS ORDERED IN ED: Medications - No data to display   IMPRESSION / MDM / ASSESSMENT AND PLAN / ED COURSE  I reviewed the triage vital signs and the nursing notes.                              Differential diagnosis includes, but is not limited to, viral illness, pneumonia, suspect his abdominal pain is musculoskeletal pain, strain from sneezing.  He has no abdominal pain at this time, no abdominal pain on exam, no nausea vomiting or diarrhea to suggest intra-abdominal etiology.  He has no chest pain or shortness of breath.  Labs and viral swab was obtained out of triage.  Will do chest x-ray.  Patient's presentation is most consistent with acute presentation with potential threat to life or bodily function.  Independent interpretation of labs and imaging below.  Considered but no indication for inpatient admission at this time, he safe for outpatient management.  Will discharge with strict return precautions.  Recommend follow-up primary care this week to get reassessed.    Clinical Course as of 06/30/24 1507  Sun Jun 30, 2024  1431 Independent review of labs, respiratory viral panel negative, electrolytes not severely deranged, LFTs are normal, lipase is normal, no leukocytosis. [TT]    Clinical Course User Index [TT] Waymond Lorelle Cummins, MD     FINAL CLINICAL IMPRESSION(S) / ED DIAGNOSES   Final diagnoses:  Muscle strain  Sneezing  Cough, unspecified type     Rx / DC Orders   ED Discharge Orders     None        Note:  This document was  prepared using Dragon voice recognition software and may include unintentional dictation errors.    Waymond Lorelle Cummins, MD 06/30/24 [phone]
